# Patient Record
Sex: Female | Born: 1950 | Race: White | Hispanic: No | Marital: Married | Smoking: Never smoker
Health system: Southern US, Community
[De-identification: ages and names within clinical notes are randomized; demographics above are authoritative.]

## PROBLEM LIST (undated history)

## (undated) DIAGNOSIS — R011 Cardiac murmur, unspecified: Secondary | ICD-10-CM

## (undated) DIAGNOSIS — F319 Bipolar disorder, unspecified: Secondary | ICD-10-CM

## (undated) DIAGNOSIS — F29 Unspecified psychosis not due to a substance or known physiological condition: Secondary | ICD-10-CM

## (undated) DIAGNOSIS — N200 Calculus of kidney: Secondary | ICD-10-CM

## (undated) DIAGNOSIS — F32A Depression, unspecified: Secondary | ICD-10-CM

## (undated) DIAGNOSIS — Z973 Presence of spectacles and contact lenses: Secondary | ICD-10-CM

## (undated) DIAGNOSIS — I499 Cardiac arrhythmia, unspecified: Secondary | ICD-10-CM

## (undated) DIAGNOSIS — I10 Essential (primary) hypertension: Secondary | ICD-10-CM

## (undated) DIAGNOSIS — F329 Major depressive disorder, single episode, unspecified: Secondary | ICD-10-CM

## (undated) HISTORY — DX: Cardiac murmur, unspecified: R01.1

## (undated) HISTORY — DX: Major depressive disorder, single episode, unspecified: F32.9

## (undated) HISTORY — DX: Calculus of kidney: N20.0

## (undated) HISTORY — DX: Depression, unspecified: F32.A

## (undated) HISTORY — DX: Essential (primary) hypertension: I10

## (undated) HISTORY — DX: Bipolar disorder, unspecified: F31.9

## (undated) HISTORY — DX: Unspecified psychosis not due to a substance or known physiological condition: F29

---

## 1998-11-29 DIAGNOSIS — N200 Calculus of kidney: Secondary | ICD-10-CM

## 1998-11-29 HISTORY — PX: LITHOTRIPSY: SUR834

## 1998-11-29 HISTORY — DX: Calculus of kidney: N20.0

## 2013-01-02 ENCOUNTER — Ambulatory Visit: Payer: Self-pay | Admitting: Adult Health

## 2013-01-30 ENCOUNTER — Ambulatory Visit: Payer: Self-pay | Admitting: Adult Health

## 2013-02-02 ENCOUNTER — Emergency Department: Payer: Self-pay | Admitting: Emergency Medicine

## 2013-02-02 LAB — COMPREHENSIVE METABOLIC PANEL
Anion Gap: 9 (ref 7–16)
BUN: 12 mg/dL (ref 7–18)
Chloride: 104 mmol/L (ref 98–107)
Co2: 25 mmol/L (ref 21–32)
EGFR (African American): >60
EGFR (Non-African Amer.): >60
Glucose: 92 mg/dL (ref 65–99)
Osmolality: 275 (ref 275–301)
SGOT(AST): 23 U/L (ref 15–37)
Sodium: 138 mmol/L (ref 136–145)

## 2013-02-02 LAB — CBC
HCT: 44.2 % (ref 35.0–47.0)
Platelet: 242 10*3/uL (ref 150–440)
WBC: 8.9 10*3/uL (ref 3.6–11.0)

## 2013-02-02 LAB — DRUG SCREEN, URINE
Amphetamines, Ur Screen: NEGATIVE (ref ?–1000)
Cannabinoid 50 Ng, Ur ~~LOC~~: NEGATIVE (ref ?–50)
Cocaine Metabolite,Ur ~~LOC~~: NEGATIVE (ref ?–300)
MDMA (Ecstasy)Ur Screen: NEGATIVE (ref ?–500)
Opiate, Ur Screen: NEGATIVE (ref ?–300)
Phencyclidine (PCP) Ur S: NEGATIVE (ref ?–25)

## 2013-02-02 LAB — URINALYSIS, COMPLETE
Bilirubin,UR: NEGATIVE
Ketone: NEGATIVE
Nitrite: NEGATIVE
Ph: 7 (ref 4.5–8.0)
Protein: NEGATIVE
RBC,UR: 1 /HPF (ref 0–5)
WBC UR: 3 /HPF (ref 0–5)

## 2013-02-02 LAB — ACETAMINOPHEN LEVEL: Acetaminophen: <2 ug/mL

## 2013-02-02 LAB — SALICYLATE LEVEL: Salicylates, Serum: <1.7 mg/dL

## 2013-02-02 LAB — ETHANOL
Ethanol %: <0.003 % (ref 0.000–0.080)
Ethanol: <3 mg/dL

## 2013-03-05 ENCOUNTER — Ambulatory Visit: Payer: Self-pay | Admitting: Adult Health

## 2013-04-04 ENCOUNTER — Ambulatory Visit (INDEPENDENT_AMBULATORY_CARE_PROVIDER_SITE_OTHER): Payer: No Typology Code available for payment source | Admitting: Adult Health

## 2013-04-04 ENCOUNTER — Encounter: Payer: Self-pay | Admitting: Adult Health

## 2013-04-04 VITALS — BP 160/92 | HR 83 | Temp 97.9°F | Resp 12 | Ht 60.5 in | Wt 179.0 lb

## 2013-04-04 DIAGNOSIS — F3289 Other specified depressive episodes: Secondary | ICD-10-CM

## 2013-04-04 DIAGNOSIS — R3989 Other symptoms and signs involving the genitourinary system: Secondary | ICD-10-CM

## 2013-04-04 DIAGNOSIS — F32A Depression, unspecified: Secondary | ICD-10-CM

## 2013-04-04 DIAGNOSIS — R399 Unspecified symptoms and signs involving the genitourinary system: Secondary | ICD-10-CM

## 2013-04-04 DIAGNOSIS — F329 Major depressive disorder, single episode, unspecified: Secondary | ICD-10-CM

## 2013-04-04 DIAGNOSIS — I10 Essential (primary) hypertension: Secondary | ICD-10-CM | POA: Insufficient documentation

## 2013-04-04 DIAGNOSIS — F309 Manic episode, unspecified: Secondary | ICD-10-CM | POA: Insufficient documentation

## 2013-04-04 LAB — POCT URINALYSIS DIPSTICK
Glucose, UA: NEGATIVE
Nitrite, UA: NEGATIVE
Urobilinogen, UA: 0.2

## 2013-04-04 MED ORDER — CIPROFLOXACIN HCL 250 MG PO TABS: 250.0000 mg | ORAL_TABLET | Freq: Two times a day (BID) | ORAL | Status: DC

## 2013-04-04 MED ORDER — METOPROLOL SUCCINATE ER 25 MG PO TB24: 25.0000 mg | ORAL_TABLET | Freq: Every day | ORAL | Status: DC

## 2013-04-04 NOTE — Assessment & Plan Note (Addendum)
History of recent suicide attempts. She is being followed by Dr. Ladona Ridgel. Patient reports no current suicidal ideations. Will request medical records from Dr. Ladona Ridgel.

## 2013-04-04 NOTE — Assessment & Plan Note (Signed)
Patient with recent episode of psychotic break with ideations of grandeur. Currently on medication and being followed by Dr. Ladona Ridgel in [place]. Will request medical records.

## 2013-04-04 NOTE — Progress Notes (Signed)
Subjective:    Patient ID: Julia Cooke, female    DOB: Aug 01, 1951, 62 y.o.   MRN: [id number]  HPI  Patient is a 62 y/o female who presents to clinic to establish care. Patient was previously followed by PCP in [place]. She is also followed by Dr. Ladona Ridgel for mania and recent hx of suicide x 3 s/p hospitalization.   Past Medical History  Diagnosis Date  . Depression   . Psychotic disorder     mania  . Hypertension   . Heart murmur   . Nephrolithiasis 2000    s/p lithotripsy    Past Surgical History  Procedure Laterality Date  . Lithotripsy  2000    History   Social History  . Marital Status: Married    Spouse Name: N/A    Number of Children: N/A  . Years of Education: N/A   Occupational History  . Not on file.   Social History Main Topics  . Smoking status: Never Smoker   . Smokeless tobacco: Never Used  . Alcohol Use: 1.2 oz/week    2 Glasses of wine per week     Comment: 2-3 times weekly  . Drug Use: No  . Sexually Active: Yes    Birth Control/ Protection: Post-menopausal   Other Topics Concern  . Not on file   Social History Narrative   Patient lives at home with her husband of 41 years. They have 3 adult children (2 boys, 1 girl). They recently moved back to [place] in January. 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 Patient has also worked as a Occupational hygienist.     Review of Systems  Constitutional: Positive for fatigue. Negative for fever and chills.  HENT: Positive for congestion.   Eyes:       Tearing  Respiratory: Positive for shortness of breath. Negative for cough and wheezing.   Cardiovascular: Negative for chest pain and leg swelling.  Gastrointestinal: Negative.   Genitourinary: Positive for frequency and difficulty urinating. Negative for dysuria, hematuria and vaginal discharge.  Musculoskeletal: Positive for arthralgias.       Knee pain  Skin: Negative.   Allergic/Immunologic: Positive for  environmental allergies.  Neurological: Negative for tremors, seizures, syncope, speech difficulty, numbness and headaches.  Hematological: Negative.   Psychiatric/Behavioral: Negative for suicidal ideas, behavioral problems, sleep disturbance and agitation. The patient is nervous/anxious.        Hx of suicide x 3. Most recent episode was 1 month ago. Pt was hospitalized. Followed by Dr. Carolanne Grumbling at [facility]. First episode was 01/29/13 where patient tried to drown herself. She was delusional and she thought she was god and her son was [employer]. Second episode occurred at [facility] where she again tried to drown herself in the toilet. Pt was admitted to [facility] 02/02/13. Third attempt occurred while at [facility] with trying to drown herself in toilet.   BP 160/92  Pulse 83  Temp(Src) 97.9 F (36.6 C) (Oral)  Resp 12  Ht 5' 0.5" (1.537 m)  Wt 179 lb (81.194 kg)  BMI 34.37 kg/m2  SpO2 98%     Objective:   Physical Exam  Constitutional: She is oriented to person, place, and time.  Overweight, pleasant, female in no apparent distress  HENT:  Head: Normocephalic and atraumatic.  Right Ear: External ear normal.  Left Ear: External ear normal.  Eyes: Conjunctivae and EOM are normal. Pupils are equal, round, and reactive to light.  Neck: Normal  range of motion. Neck supple.  Cardiovascular: Normal rate, regular rhythm, normal heart sounds and intact distal pulses.  Exam reveals no gallop.   No murmur heard. Pulmonary/Chest: Effort normal and breath sounds normal. No respiratory distress. She has no wheezes. She has no rales.  Abdominal: Soft. Bowel sounds are normal. She exhibits no distension and no mass. There is no tenderness. There is no rebound and no guarding.  Musculoskeletal: Normal range of motion. She exhibits no edema and no tenderness.  Lymphadenopathy:    She has no cervical adenopathy.  Neurological: She is alert and oriented to person, place, and time. No cranial nerve deficit.  Coordination normal.  Skin: Skin is warm and dry.  Psychiatric: She has a normal mood and affect. Her behavior is normal. Judgment and thought content normal.       Assessment & Plan:

## 2013-04-04 NOTE — Assessment & Plan Note (Signed)
Restart Toprol 25 mg daily. Diet low in sodium. Exercise at least 3 times a week. RTC in 4-5 weeks for followup.

## 2013-04-04 NOTE — Progress Notes (Signed)
Left message for pt, advising of urinalysis results. Advised Cipro 250 mg 1 tab BID X 3 days escripted to [facility] per Orville Govern, NP.

## 2013-04-04 NOTE — Patient Instructions (Addendum)
  Thank you for choosing [facility] for your health care needs.  Start the metoprolol 25 mg daily.  Return for follow up appointment in 4 weeks.  I am requesting medical records from your previous primary care provider as well as Dr. Ladona Ridgel with [facility].

## 2013-04-05 ENCOUNTER — Other Ambulatory Visit: Payer: Self-pay | Admitting: *Deleted

## 2013-04-05 DIAGNOSIS — Z1211 Encounter for screening for malignant neoplasm of colon: Secondary | ICD-10-CM

## 2013-04-05 DIAGNOSIS — Z0000 Encounter for general adult medical examination without abnormal findings: Secondary | ICD-10-CM

## 2013-04-05 DIAGNOSIS — Z1239 Encounter for other screening for malignant neoplasm of breast: Secondary | ICD-10-CM

## 2013-04-05 LAB — URINE CULTURE: Colony Count: 70000

## 2013-04-20 ENCOUNTER — Other Ambulatory Visit (INDEPENDENT_AMBULATORY_CARE_PROVIDER_SITE_OTHER): Payer: No Typology Code available for payment source

## 2013-04-20 DIAGNOSIS — Z0000 Encounter for general adult medical examination without abnormal findings: Secondary | ICD-10-CM

## 2013-04-20 LAB — CBC WITH DIFFERENTIAL/PLATELET
Basophils Absolute: 0.1 K/uL (ref 0.0–0.1)
Basophils Relative: 0.9 % (ref 0.0–3.0)
Eosinophils Absolute: 0.1 K/uL (ref 0.0–0.7)
Eosinophils Relative: 2.3 % (ref 0.0–5.0)
HCT: 41.8 % (ref 36.0–46.0)
Hemoglobin: 14.4 g/dL (ref 12.0–15.0)
Lymphocytes Relative: 29.7 % (ref 12.0–46.0)
Lymphs Abs: 1.9 K/uL (ref 0.7–4.0)
MCHC: 34.6 g/dL (ref 30.0–36.0)
MCV: 90.2 fl (ref 78.0–100.0)
Monocytes Absolute: 0.6 K/uL (ref 0.1–1.0)
Monocytes Relative: 9.2 % (ref 3.0–12.0)
Neutro Abs: 3.6 K/uL (ref 1.4–7.7)
Neutrophils Relative %: 57.9 % (ref 43.0–77.0)
Platelets: 177 K/uL (ref 150.0–400.0)
RBC: 4.64 Mil/uL (ref 3.87–5.11)
RDW: 12.4 % (ref 11.5–14.6)
WBC: 6.3 K/uL (ref 4.5–10.5)

## 2013-04-20 LAB — COMPREHENSIVE METABOLIC PANEL
ALT: 17 U/L (ref 0–35)
Albumin: 3.9 g/dL (ref 3.5–5.2)
CO2: 26 mEq/L (ref 19–32)
Calcium: 8.9 mg/dL (ref 8.4–10.5)
Chloride: 105 mEq/L (ref 96–112)
GFR: 83.4 mL/min (ref >60.00)
Glucose, Bld: 91 mg/dL (ref 70–99)
Potassium: 4.6 mEq/L (ref 3.5–5.1)
Sodium: 138 mEq/L (ref 135–145)
Total Bilirubin: 0.5 mg/dL (ref 0.3–1.2)
Total Protein: 6.8 g/dL (ref 6.0–8.3)

## 2013-04-20 LAB — TSH: TSH: 0.45 u[IU]/mL (ref 0.35–5.50)

## 2013-04-20 LAB — LIPID PANEL: HDL: 47.7 mg/dL (ref >39.00)

## 2013-04-20 LAB — LDL CHOLESTEROL, DIRECT: Direct LDL: 136.4 mg/dL

## 2013-05-02 ENCOUNTER — Encounter: Payer: Self-pay | Admitting: Adult Health

## 2013-05-02 ENCOUNTER — Encounter: Payer: Self-pay | Admitting: Emergency Medicine

## 2013-05-02 ENCOUNTER — Ambulatory Visit (INDEPENDENT_AMBULATORY_CARE_PROVIDER_SITE_OTHER): Payer: No Typology Code available for payment source | Admitting: Adult Health

## 2013-05-02 VITALS — BP 120/60 | HR 82 | Resp 12 | Wt 179.5 lb

## 2013-05-02 DIAGNOSIS — M25569 Pain in unspecified knee: Secondary | ICD-10-CM

## 2013-05-02 DIAGNOSIS — M25561 Pain in right knee: Secondary | ICD-10-CM

## 2013-05-02 DIAGNOSIS — J329 Chronic sinusitis, unspecified: Secondary | ICD-10-CM

## 2013-05-02 DIAGNOSIS — I10 Essential (primary) hypertension: Secondary | ICD-10-CM

## 2013-05-02 MED ORDER — AZITHROMYCIN 250 MG PO TABS: ORAL_TABLET | ORAL | Status: DC

## 2013-05-02 MED ORDER — FLUTICASONE PROPIONATE 50 MCG/ACT NA SUSP: 2.0000 | Freq: Every day | NASAL | Status: DC

## 2013-05-02 NOTE — Assessment & Plan Note (Signed)
Patient is status post fall in December of 2012. Chronic problems with right knee without evaluation. She has swelling and pain with ambulation. Refer to orthopedic.

## 2013-05-02 NOTE — Patient Instructions (Addendum)
  Start Azithromycin today. Take 2 tablets today then 1 tablet daily for the next 4 days.  Flonase nasal spray 2 sprays in each nostril at bedtime.  Sinus rinse - you can use a netti pot or simply saline spray.  Your lab work was all normal.  Try to implement an exercise program which will contribute to your overall health and well-being.  I am referring you to orthopedic for your chronic right knee pain and swelling.

## 2013-05-02 NOTE — Assessment & Plan Note (Addendum)
Green drainage. Start Zpak. Flonase nasal spray. May use simple saline to help irrigate sinus.

## 2013-05-02 NOTE — Assessment & Plan Note (Addendum)
Started on metoprolol approximately 4 weeks ago. Blood pressure has been well-controlled. Encouraged exercising for overall health and weight loss. Discussed lab work with patient. All were normal. Patient will return for complete physical in October 2014. Note, greater than 25 minutes were spent in face-to-face communication with the assessment, plan and implementation of  problems listed.

## 2013-05-02 NOTE — Progress Notes (Signed)
  Subjective:    Patient ID: Julia Cooke, female    DOB: [birth year], 62 y.o.   MRN: [id number]  HPI  Patient is here for f/u of hypertension. She was started on metoprolol approximately 4 weeks ago. She is taking her medication daily. She is feeling well. Still not exercising. Denies headache, shortness of breath, chest pain or edema.  Patient also reports greater than one week symptoms of sinus pressure, green drainage, mild cough, congestion. Her husband has been sick for greater than 2 weeks and she believes she has contracted what he has.  Patient also reports right knee pain. She reports she fell December 2012 and her knee has not been the same since. She has not had any followup or evaluation for this. She has some swelling of her right knee. Pain is mainly with ambulation. She has a steady gait, good balance.   Past Medical History  Diagnosis Date  . Depression   . Psychotic disorder     mania  . Hypertension   . Heart murmur   . Nephrolithiasis 2000    s/p lithotripsy     Review of Systems  Constitutional: Negative for fever and chills.  HENT: Positive for congestion, sore throat and sinus pressure.   Eyes: Negative for photophobia and visual disturbance.       Runny eyes. Patient believes this is from seasonal allergies.  Respiratory: Positive for cough. Negative for wheezing.   Cardiovascular: Negative.   Gastrointestinal: Negative.   Endocrine: Negative.   Genitourinary: Negative.   Musculoskeletal: Positive for joint swelling.       Right knee pain  Skin: Negative.   Allergic/Immunologic: Positive for environmental allergies.  Neurological: Negative for dizziness, syncope, light-headedness and headaches.  Hematological: Negative.   Psychiatric/Behavioral: Negative for behavioral problems, confusion and agitation. The patient is not nervous/anxious.     BP 120/60  Pulse 82  Resp 12  Wt 179 lb 8 oz (81.421 kg)  BMI 34.47 kg/m2  SpO2 96%    Objective:   Physical Exam  Constitutional: She is oriented to person, place, and time.  Overweight, pleasant female in NAD  Cardiovascular: Normal rate, regular rhythm and normal heart sounds.  Exam reveals no gallop.   No murmur heard. Pulmonary/Chest: Effort normal and breath sounds normal. No respiratory distress. She has no wheezes. She has no rales.  Abdominal: Soft. Bowel sounds are normal.  Neurological: She is alert and oriented to person, place, and time.  Skin: Skin is warm and dry.  Psychiatric: She has a normal mood and affect. Judgment and thought content normal.       Assessment & Plan:

## 2013-05-30 ENCOUNTER — Other Ambulatory Visit: Payer: Self-pay | Admitting: *Deleted

## 2013-05-30 MED ORDER — FLUTICASONE PROPIONATE 50 MCG/ACT NA SUSP: 2.0000 | Freq: Every day | NASAL | Status: DC

## 2013-05-30 NOTE — Telephone Encounter (Signed)
Rx sent to pharmacy by escript. Prior refill in error, it was denied, but meant to ok since it was requested by mail order.

## 2013-05-30 NOTE — Telephone Encounter (Signed)
Done 05/02/13 with refills, receipt confirmed by pharmacy

## 2013-06-04 ENCOUNTER — Other Ambulatory Visit: Payer: Self-pay | Admitting: *Deleted

## 2013-06-04 MED ORDER — METOPROLOL SUCCINATE ER 25 MG PO TB24: 25.0000 mg | ORAL_TABLET | Freq: Every day | ORAL | Status: DC

## 2013-07-13 ENCOUNTER — Encounter: Payer: Self-pay | Admitting: Adult Health

## 2013-07-13 ENCOUNTER — Ambulatory Visit (INDEPENDENT_AMBULATORY_CARE_PROVIDER_SITE_OTHER): Payer: No Typology Code available for payment source | Admitting: Adult Health

## 2013-07-13 VITALS — HR 93 | Temp 98.3°F | Resp 14 | Wt 175.5 lb

## 2013-07-13 DIAGNOSIS — W57XXXA Bitten or stung by nonvenomous insect and other nonvenomous arthropods, initial encounter: Secondary | ICD-10-CM

## 2013-07-13 DIAGNOSIS — T148 Other injury of unspecified body region: Secondary | ICD-10-CM

## 2013-07-13 MED ORDER — AZITHROMYCIN 250 MG PO TABS: ORAL_TABLET | ORAL | Status: DC

## 2013-07-13 NOTE — Progress Notes (Signed)
  Subjective:    Patient ID: Julia Cooke, female    DOB: Sep 03, 1951, 62 y.o.   MRN: [id number]  HPI  Julia Cooke presents s/p tick bite with surrounding irritation and iching. She noticed the tick 3 days ago and removed it completely. She does not know how long it had been attached. No other symptoms.   Current Outpatient Prescriptions on File Prior to Visit  Medication Sig Dispense Refill  . carbamazepine (TEGRETOL) 200 MG tablet Take 200 mg by mouth at bedtime.      . metoprolol succinate (TOPROL-XL) 25 MG 24 hr tablet Take 1 tablet (25 mg total) by mouth daily.  90 tablet  1  . Multiple Vitamin (MULTIVITAMIN) tablet Take 1 tablet by mouth daily.      . risperiDONE (RISPERDAL) 3 MG tablet Take 3 mg by mouth at bedtime.      . traZODone (DESYREL) 100 MG tablet Take 50 mg by mouth at bedtime.       . fluticasone (FLONASE) 50 MCG/ACT nasal spray Place 2 sprays into the nose daily.  16 g  5   No current facility-administered medications on file prior to visit.      Review of Systems  Skin: left upper arm tick bite with irritation and redness.  Pulse 93  Temp(Src) 98.3 F (36.8 C) (Oral)  Resp 14  Wt 175 lb 8 oz (79.606 kg)  BMI 33.7 kg/m2  SpO2 98%     Objective:   Physical Exam  Skin: left upper arm with erythema surrounding area of tick bite. Erythema is uniform. No fever or s/s infection.       Assessment & Plan:

## 2013-07-13 NOTE — Patient Instructions (Addendum)
  Start Azithromycin - take 2 tablets today then take 1 table daily for the next 4 days.  Apply cortisone cream to the area for itching.

## 2013-07-13 NOTE — Assessment & Plan Note (Signed)
Tick bite and tick removed 3 days ago. No s/s infection. She cannot tolerate doxycycline secondary to diarrhea. Start azithromycin. RTC if fever, rash or flu like symptoms.

## 2013-11-02 ENCOUNTER — Telehealth: Payer: Self-pay | Admitting: Adult Health

## 2013-11-02 ENCOUNTER — Other Ambulatory Visit: Payer: Self-pay

## 2013-11-02 MED ORDER — METOPROLOL SUCCINATE ER 25 MG PO TB24: 25.0000 mg | ORAL_TABLET | Freq: Every day | ORAL | Status: DC

## 2013-11-02 NOTE — Telephone Encounter (Signed)
rx sent to [facility] [address]

## 2013-11-02 NOTE — Telephone Encounter (Signed)
Metoprolol needed.  States she is completely out.  States she needs it from [employer], 30 day supply.  States usually gets mail order so local pharmacy could not do refill request.

## 2013-11-20 ENCOUNTER — Emergency Department: Payer: Self-pay | Admitting: Emergency Medicine

## 2014-01-07 ENCOUNTER — Encounter (INDEPENDENT_AMBULATORY_CARE_PROVIDER_SITE_OTHER): Payer: Self-pay

## 2014-01-07 ENCOUNTER — Encounter: Payer: Self-pay | Admitting: Adult Health

## 2014-01-07 ENCOUNTER — Ambulatory Visit (INDEPENDENT_AMBULATORY_CARE_PROVIDER_SITE_OTHER): Payer: BC Managed Care – PPO | Admitting: Adult Health

## 2014-01-07 VITALS — BP 130/88 | HR 94 | Resp 12 | Wt 176.0 lb

## 2014-01-07 DIAGNOSIS — L293 Anogenital pruritus, unspecified: Secondary | ICD-10-CM

## 2014-01-07 DIAGNOSIS — R5381 Other malaise: Secondary | ICD-10-CM

## 2014-01-07 DIAGNOSIS — R300 Dysuria: Secondary | ICD-10-CM

## 2014-01-07 DIAGNOSIS — R5383 Other fatigue: Secondary | ICD-10-CM

## 2014-01-07 DIAGNOSIS — Z79899 Other long term (current) drug therapy: Secondary | ICD-10-CM

## 2014-01-07 DIAGNOSIS — N898 Other specified noninflammatory disorders of vagina: Secondary | ICD-10-CM

## 2014-01-07 DIAGNOSIS — R6882 Decreased libido: Secondary | ICD-10-CM

## 2014-01-07 LAB — POCT URINALYSIS DIPSTICK
Bilirubin, UA: NEGATIVE
Blood, UA: NEGATIVE
Glucose, UA: NEGATIVE
Ketones, UA: NEGATIVE
NITRITE UA: NEGATIVE
PROTEIN UA: NEGATIVE
SPEC GRAV UA: 1.015
UROBILINOGEN UA: 0.2
pH, UA: 6.5

## 2014-01-07 LAB — CBC WITH DIFFERENTIAL/PLATELET
BASOS ABS: 0 10*3/uL (ref 0.0–0.1)
Basophils Relative: 0.6 % (ref 0.0–3.0)
EOS PCT: 1.1 % (ref 0.0–5.0)
Eosinophils Absolute: 0.1 10*3/uL (ref 0.0–0.7)
HCT: 41.8 % (ref 36.0–46.0)
HEMOGLOBIN: 14.2 g/dL (ref 12.0–15.0)
LYMPHS PCT: 18.3 % (ref 12.0–46.0)
Lymphs Abs: 1.2 10*3/uL (ref 0.7–4.0)
MCHC: 33.9 g/dL (ref 30.0–36.0)
MCV: 93.1 fl (ref 78.0–100.0)
MONOS PCT: 7.2 % (ref 3.0–12.0)
Monocytes Absolute: 0.5 10*3/uL (ref 0.1–1.0)
NEUTROS ABS: 4.9 10*3/uL (ref 1.4–7.7)
Neutrophils Relative %: 72.8 % (ref 43.0–77.0)
Platelets: 234 10*3/uL (ref 150.0–400.0)
RBC: 4.49 Mil/uL (ref 3.87–5.11)
RDW: 12.7 % (ref 11.5–14.6)
WBC: 6.7 10*3/uL (ref 4.5–10.5)

## 2014-01-07 LAB — LIPID PANEL
CHOLESTEROL: 242 mg/dL — AB (ref 0–200)
HDL: 50.2 mg/dL (ref >39.00)
Total CHOL/HDL Ratio: 5
Triglycerides: 77 mg/dL (ref 0.0–149.0)
VLDL: 15.4 mg/dL (ref 0.0–40.0)

## 2014-01-07 LAB — COMPREHENSIVE METABOLIC PANEL
ALT: 18 U/L (ref 0–35)
AST: 20 U/L (ref 0–37)
Albumin: 4.3 g/dL (ref 3.5–5.2)
Alkaline Phosphatase: 63 U/L (ref 39–117)
BILIRUBIN TOTAL: 0.7 mg/dL (ref 0.3–1.2)
BUN: 21 mg/dL (ref 6–23)
CO2: 25 meq/L (ref 19–32)
CREATININE: 0.7 mg/dL (ref 0.4–1.2)
Calcium: 9.2 mg/dL (ref 8.4–10.5)
Chloride: 106 mEq/L (ref 96–112)
GFR: 84.5 mL/min (ref >60.00)
GLUCOSE: 104 mg/dL — AB (ref 70–99)
Potassium: 4.2 mEq/L (ref 3.5–5.1)
Sodium: 139 mEq/L (ref 135–145)
TOTAL PROTEIN: 7.4 g/dL (ref 6.0–8.3)

## 2014-01-07 LAB — LDL CHOLESTEROL, DIRECT: LDL DIRECT: 181.7 mg/dL

## 2014-01-07 LAB — TSH: TSH: 0.93 u[IU]/mL (ref 0.35–5.50)

## 2014-01-07 MED ORDER — CIPROFLOXACIN HCL 250 MG PO TABS: 250.0000 mg | ORAL_TABLET | Freq: Two times a day (BID) | ORAL | Status: DC

## 2014-01-07 MED ORDER — FLUCONAZOLE 150 MG PO TABS: 150.0000 mg | ORAL_TABLET | Freq: Once | ORAL | Status: DC

## 2014-01-07 NOTE — Progress Notes (Signed)
Patient ID: Julia Cooke, female   DOB: 1951/03/29, 63 y.o.   MRN: [id number]    Subjective:    Patient ID: Julia Cooke, female    DOB: Jan 19, 1951, 63 y.o.   MRN: [id number]  HPI  Pt presents to clinic with the following concerns:  1) She recently saw her psychiatrist 01/02/14. She reports seeing a new MD because she did not feel she was connecting well with the female psychiatrist. Now has a female doctor. Medications reviewed and Dr. Jamse Arn would like labs for medication management. Pt reports compliance with all her medications.  2) Vaginal itching with discharge. Also feels pain during intercourse. She reports being very dry.  3) Fatigue. Some of her medications have this as a side effect.  4) Decreased libido. Reports not engaging in sex for months. She also reports that she feels claustrophobic or smothered when her husband approaches her. In addition, her husband has problems with ED. She is appears very distressed over her lack of intimacy with her husband.  5) Dysuria. Noticed some burning with urination and has been ongoing for several days. Has not taken any over-the-counter products  Past Medical History  Diagnosis Date  . Depression   . Psychotic disorder     mania  . Hypertension   . Heart murmur   . Nephrolithiasis 2000    s/p lithotripsy   Past Surgical History  Procedure Laterality Date  . Lithotripsy  2000    Family History  Problem Relation Age of Onset  . Hyperlipidemia Mother   . Stroke Mother   . Hypertension Mother   . Hyperlipidemia Father   . Hypertension Father   . Cancer Sister     breast  . Heart disease Brother     myocardial infarction  . Hypertension Brother   . Hypertension Sister   . Hypertension Sister     History   Social History  . Marital Status: Married    Spouse Name: N/A    Number of Children: N/A  . Years of Education: N/A   Occupational History  . Not on file.   Social History Main Topics  . Smoking status: Never  Smoker   . Smokeless tobacco: Never Used  . Alcohol Use: 1.2 oz/week    2 Glasses of wine per week     Comment: 2-3 times weekly  . Drug Use: No  . Sexual Activity: Yes    Birth Control/ Protection: Post-menopausal   Other Topics Concern  . Not on file   Social History Narrative   Patient lives at home with her husband of 41 years. They have 3 adult children (2 boys, 1 girl). They recently moved back to [place] in January. 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     Current Outpatient Prescriptions on File Prior to Visit  Medication Sig Dispense Refill  . fluticasone (FLONASE) 50 MCG/ACT nasal spray Place 2 sprays into the nose daily.  16 g  5  . metoprolol succinate (TOPROL-XL) 25 MG 24 hr tablet Take 1 tablet (25 mg total) by mouth daily.  90 tablet  1  . Multiple Vitamin (MULTIVITAMIN) tablet Take 1 tablet by mouth daily.      . risperiDONE (RISPERDAL) 3 MG tablet Take 3 mg by mouth at bedtime.       No current facility-administered medications on file prior to visit.     Review of Systems  Constitutional:  Positive for fatigue.  HENT: Negative.   Eyes: Negative.   Respiratory: Negative.   Cardiovascular: Negative.   Gastrointestinal: Negative.   Endocrine: Negative.   Genitourinary: Positive for dysuria, vaginal discharge and dyspareunia. Negative for hematuria, flank pain and pelvic pain.       Vaginal itching  Musculoskeletal: Negative.   Skin: Negative.   Allergic/Immunologic: Negative.   Neurological: Negative.   Hematological: Negative.   Psychiatric/Behavioral:       Distress over decreased libido       Objective:  BP 130/88  Pulse 94  Resp 12  Wt 176 lb (79.833 kg)  SpO2 97%   Physical Exam  Constitutional: She is oriented to person, place, and time. She appears well-developed and well-nourished. No distress.  HENT:  Head: Normocephalic and atraumatic.  Neck: Normal range of  motion. Neck supple. No tracheal deviation present.  Cardiovascular: Normal rate and regular rhythm.  Exam reveals friction rub.   Pulmonary/Chest: Effort normal. No respiratory distress.  Abdominal: Soft. Bowel sounds are normal. She exhibits no distension and no mass. There is no tenderness. There is no rebound and no guarding.  Genitourinary: Rectum normal and uterus normal.    Cervix exhibits discharge. Cervix exhibits no motion tenderness and no friability. Right adnexum displays no mass, no tenderness and no fullness. Left adnexum displays no mass, no tenderness and no fullness. Vaginal discharge found.    Musculoskeletal: Normal range of motion. She exhibits no edema.  Neurological: She is alert and oriented to person, place, and time. She has normal reflexes. Coordination normal.  Skin: Skin is warm and dry.  Psychiatric: She has a normal mood and affect. Her behavior is normal. Judgment and thought content normal.       Assessment & Plan:   1. Fatigue  check labs  - TSH - CBC with Differential  2. Decreased libido Reviewed medications. None really list side effect of decreased libido. Discussed different approaches she can take to promote intimacy with her husband. Discussed using OTC water based lubricants. F/U 2 weeks   3. Vaginal itching GYN exam showed some atrophy. There was some discharge. Wet Prep sent.  - fluconazole (DIFLUCAN) 150 MG tablet; Take 1 tablet (150 mg total) by mouth once.  Dispense: 3 tablet; Refill: 0  4. Medication management Recently saw her psychiatrist. Pt is on medications that need monitoring. Will check labs and send results to Psych. Pt wanted to have all labs drawn here.  - Comprehensive metabolic panel - CBC with Differential - Lipid panel - Carbamazepine Level (Tegretol), total  5. Dysuria UA shows possible UTI. Send for culture. Start Cipro  - ciprofloxacin (CIPRO) 250 MG tablet; Take 1 tablet (250 mg total) by mouth 2 (two)  times daily.  Dispense: 6 tablet; Refill: 0 - Urine Culture - POCT Urinalysis Dipstick

## 2014-01-07 NOTE — Addendum Note (Signed)
Addended by: Karlene Einstein D on: 01/07/2014 03:06 PM   Modules accepted: Orders

## 2014-01-07 NOTE — Addendum Note (Signed)
Addended by: Karlene Einstein D on: 01/07/2014 03:05 PM   Modules accepted: Orders

## 2014-01-07 NOTE — Patient Instructions (Signed)
  Start Cipro 250 mg twice a day for 3 days.  Also take Diflucan 150 mg daily for 3 days.  Please have labs drawn prior to leaving the office.  I will call you with results once they're available.  I will also send the results requested by adult services to them for review.  Use a water based lubricant without any perfumes such as [place] Jelly to see if this improves your symptoms.  Return for follow up in 2 weeks.

## 2014-01-07 NOTE — Progress Notes (Signed)
Pre visit review using our clinic review tool, if applicable. No additional management support is needed unless otherwise documented below in the visit note. 

## 2014-01-08 LAB — WET PREP BY MOLECULAR PROBE
CANDIDA SPECIES: NEGATIVE
Gardnerella vaginalis: NEGATIVE
TRICHOMONAS VAG: NEGATIVE

## 2014-01-08 LAB — URINE CULTURE: Colony Count: 30000

## 2014-01-08 LAB — CARBAMAZEPINE LEVEL, TOTAL: Carbamazepine Lvl: 2.9 ug/mL — ABNORMAL LOW (ref 4.0–12.0)

## 2014-01-10 LAB — CULTURE, ROUTINE-GENITAL: Organism ID, Bacteria: NORMAL

## 2014-01-28 ENCOUNTER — Ambulatory Visit (INDEPENDENT_AMBULATORY_CARE_PROVIDER_SITE_OTHER): Payer: BC Managed Care – PPO | Admitting: Adult Health

## 2014-01-28 ENCOUNTER — Encounter: Payer: Self-pay | Admitting: Adult Health

## 2014-01-28 VITALS — BP 128/68 | HR 92 | Temp 98.4°F | Resp 14 | Wt 177.0 lb

## 2014-01-28 DIAGNOSIS — IMO0002 Reserved for concepts with insufficient information to code with codable children: Secondary | ICD-10-CM | POA: Insufficient documentation

## 2014-01-28 DIAGNOSIS — N951 Menopausal and female climacteric states: Secondary | ICD-10-CM | POA: Insufficient documentation

## 2014-01-28 MED ORDER — ESTROGENS, CONJUGATED 0.625 MG/GM VA CREA: TOPICAL_CREAM | VAGINAL | Status: DC

## 2014-01-28 NOTE — Progress Notes (Signed)
   Subjective:    Patient ID: Julia Cooke, female    DOB: [birth year], 63 y.o.   MRN: [id number]  HPI  Mrs. Carreto is a pleasant 63 y/o female who presents to clinic for f/u of vaginal symptoms. She was seen 2 weeks ago with c/o dyspareunia, dryness and itching. Wet prep was negative for yeast or BV. She did have some atrophy noted on exam. She was also treated for UTI symptoms which these have resolved. Pt reports that she has a burning sensation when urine comes in contact with her skin. Still having the dryness.  Current Outpatient Prescriptions on File Prior to Visit  Medication Sig Dispense Refill  . fluconazole (DIFLUCAN) 150 MG tablet Take 1 tablet (150 mg total) by mouth once.  3 tablet  0  . fluticasone (FLONASE) 50 MCG/ACT nasal spray Place 2 sprays into the nose daily.  16 g  5  . metoprolol succinate (TOPROL-XL) 25 MG 24 hr tablet Take 1 tablet (25 mg total) by mouth daily.  90 tablet  1  . Multiple Vitamin (MULTIVITAMIN) tablet Take 1 tablet by mouth daily.      . risperiDONE (RISPERDAL) 3 MG tablet Take 3 mg by mouth at bedtime.       No current facility-administered medications on file prior to visit.     Review of Systems  Constitutional: Negative.   Respiratory: Negative.   Cardiovascular: Negative.   Genitourinary: Positive for dyspareunia. Negative for frequency, hematuria, vaginal bleeding, vaginal discharge, genital sores and pelvic pain.  Musculoskeletal: Negative.   Neurological: Negative.   Psychiatric/Behavioral: Negative.   All other systems reviewed and are negative.       Objective:   Physical Exam  Constitutional: She is oriented to person, place, and time. She appears well-developed and well-nourished. No distress.  Cardiovascular: Normal rate and regular rhythm.   Pulmonary/Chest: Effort normal. No respiratory distress.  Genitourinary:  Wet Prep negative for yeast or bacteria  Musculoskeletal: Normal range of motion.  Neurological: She is alert  and oriented to person, place, and time.  Skin: Skin is warm and dry.  Psychiatric: She has a normal mood and affect. Her behavior is normal. Thought content normal.       Assessment & Plan:   1. Dyspareunia Discussed trying premarin vaginal cream. Samples provided. Instructed on use.  2. Vaginal dryness, menopausal Premarin cream. If symptoms do not improve will refer to GYN

## 2014-01-28 NOTE — Progress Notes (Signed)
Pre visit review using our clinic review tool, if applicable. No additional management support is needed unless otherwise documented below in the visit note. 

## 2014-01-28 NOTE — Patient Instructions (Signed)
  Apply 0.5 g of premarin into the vagina nightly for 7 days then start every other night.  Please let me know if this is improving your symptoms.

## 2014-04-08 ENCOUNTER — Ambulatory Visit (INDEPENDENT_AMBULATORY_CARE_PROVIDER_SITE_OTHER): Payer: BC Managed Care – PPO | Admitting: Adult Health

## 2014-04-08 ENCOUNTER — Other Ambulatory Visit (HOSPITAL_COMMUNITY)
Admission: RE | Admit: 2014-04-08 | Discharge: 2014-04-08 | Disposition: A | Discharge status: Home / Self Care | Payer: BC Managed Care – PPO | Source: Ambulatory Visit | Attending: Adult Health | Admitting: Adult Health

## 2014-04-08 ENCOUNTER — Encounter: Payer: Self-pay | Admitting: Adult Health

## 2014-04-08 VITALS — BP 120/78 | HR 90 | Temp 98.2°F | Resp 12 | Ht 60.0 in | Wt 174.0 lb

## 2014-04-08 DIAGNOSIS — Z1211 Encounter for screening for malignant neoplasm of colon: Secondary | ICD-10-CM

## 2014-04-08 DIAGNOSIS — Z0000 Encounter for general adult medical examination without abnormal findings: Secondary | ICD-10-CM

## 2014-04-08 DIAGNOSIS — Z23 Encounter for immunization: Secondary | ICD-10-CM

## 2014-04-08 DIAGNOSIS — Z01419 Encounter for gynecological examination (general) (routine) without abnormal findings: Secondary | ICD-10-CM | POA: Insufficient documentation

## 2014-04-08 DIAGNOSIS — R7301 Impaired fasting glucose: Secondary | ICD-10-CM | POA: Insufficient documentation

## 2014-04-08 DIAGNOSIS — R7309 Other abnormal glucose: Secondary | ICD-10-CM

## 2014-04-08 DIAGNOSIS — Z124 Encounter for screening for malignant neoplasm of cervix: Secondary | ICD-10-CM

## 2014-04-08 DIAGNOSIS — Z1239 Encounter for other screening for malignant neoplasm of breast: Secondary | ICD-10-CM

## 2014-04-08 DIAGNOSIS — Z0001 Encounter for general adult medical examination with abnormal findings: Secondary | ICD-10-CM | POA: Insufficient documentation

## 2014-04-08 DIAGNOSIS — R739 Hyperglycemia, unspecified: Secondary | ICD-10-CM

## 2014-04-08 DIAGNOSIS — Z1151 Encounter for screening for human papillomavirus (HPV): Secondary | ICD-10-CM | POA: Insufficient documentation

## 2014-04-08 MED ORDER — ZOSTER VACCINE LIVE 19400 UNT/0.65ML ~~LOC~~ SOLR: 0.6500 mL | Freq: Once | SUBCUTANEOUS | Status: DC

## 2014-04-08 NOTE — Progress Notes (Signed)
Patient ID: Julia Cooke, female   DOB: [birth year], 62 y.o.   MRN: [id number]   Subjective:    Patient ID: Julia Cooke, female    DOB: 20-Jul-1951, 63 y.o.   MRN: [id number]  HPI  Patient is a pleasant 63 year old female who presents to clinic for her annual exam including breast and Pap. She reports recent episode of stomach upset. No vomiting or diarrhea. Reports decreased appetite. She believes she may have eaten something that did not agree with her.   Past Medical History  Diagnosis Date  . Depression   . Psychotic disorder     mania  . Hypertension   . Heart murmur   . Nephrolithiasis 2000    s/p lithotripsy    Past Surgical History  Procedure Laterality Date  . Lithotripsy  2000    Family History  Problem Relation Age of Onset  . Hyperlipidemia Mother   . Stroke Mother   . Hypertension Mother   . Hyperlipidemia Father   . Hypertension Father   . Cancer Sister     breast  . Heart disease Brother     myocardial infarction  . Hypertension Brother   . Hypertension Sister   . Hypertension Sister      History   Social History  . Marital Status: Married    Spouse Name: N/A    Number of Children: N/A  . Years of Education: N/A   Occupational History  . Not on file.   Social History Main Topics  . Smoking status: Never Smoker   . Smokeless tobacco: Never Used  . Alcohol Use: 1.2 oz/week    2 Glasses of wine per week     Comment: 2-3 times weekly  . Drug Use: No  . Sexual Activity: Yes    Birth Control/ Protection: Post-menopausal   Other Topics Concern  . Not on file   Social History Narrative   Patient lives at home with her husband of 30 years. They have 3 adult children (2 boys, 1 girl). They recently moved back to [place] in January. 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    Current Outpatient Prescriptions on File Prior to Visit  Medication Sig Dispense Refill  .  carbamazepine (TEGRETOL) 200 MG tablet Take 200 mg by mouth once. Take 1/2 tablet daily      . metoprolol succinate (TOPROL-XL) 25 MG 24 hr tablet Take 1 tablet (25 mg total) by mouth daily.  90 tablet  1  . Multiple Vitamin (MULTIVITAMIN) tablet Take 1 tablet by mouth daily.      . risperiDONE (RISPERDAL) 3 MG tablet Take 3 mg by mouth at bedtime. Take 1/2 tablet at bedtime       No current facility-administered medications on file prior to visit.    Review of Systems  Constitutional: Positive for appetite change (recent occurrence ).  HENT: Negative.   Eyes: Negative.   Respiratory: Negative.   Cardiovascular: Negative.   Gastrointestinal: Negative.   Endocrine: Negative.   Genitourinary: Negative.  Negative for vaginal bleeding, vaginal discharge, vaginal pain and pelvic pain.       Vaginal itch   Musculoskeletal: Negative.   Skin: Negative.   Allergic/Immunologic: Negative.   Neurological: Negative.   Hematological: Negative.   Psychiatric/Behavioral: Negative.        Objective:  BP 120/78  Pulse 90  Temp(Src) 98.2 F (36.8 C) (Oral)  Resp 12  Ht 5' (1.524 m)  Wt 174 lb (78.926 kg)  BMI 33.98 kg/m2  SpO2 95%   Physical Exam  Constitutional: She is oriented to person, place, and time. She appears well-developed and well-nourished. No distress.  HENT:  Head: Normocephalic and atraumatic.  Right Ear: External ear normal.  Left Ear: External ear normal.  Nose: Nose normal.  Mouth/Throat: Oropharynx is clear and moist.  Eyes: Conjunctivae and EOM are normal. Pupils are equal, round, and reactive to light.  Neck: Normal range of motion. Neck supple. No tracheal deviation present. No thyromegaly present.  Cardiovascular: Normal rate, regular rhythm, normal heart sounds and intact distal pulses.  Exam reveals no gallop and no friction rub.   No murmur heard. Pulmonary/Chest: Effort normal and breath sounds normal. No respiratory distress. She has no wheezes. She has no  rales. Right breast exhibits no inverted nipple, no mass, no nipple discharge, no skin change and no tenderness. Left breast exhibits no inverted nipple, no mass, no nipple discharge, no skin change and no tenderness. Breasts are symmetrical.  Abdominal: Soft. Bowel sounds are normal. She exhibits no distension and no mass. There is no tenderness. There is no rebound and no guarding.  Genitourinary: Vagina normal and uterus normal. Rectal exam shows external hemorrhoid. Rectal exam shows no internal hemorrhoid, no fissure, no mass, no tenderness and anal tone normal. Guaiac negative stool. No breast swelling, tenderness, discharge or bleeding. No labial fusion. There is no rash, tenderness, lesion or injury on the right labia. There is no rash, tenderness, lesion or injury on the left labia. Uterus is not deviated, not enlarged, not fixed and not tender. Cervix exhibits no motion tenderness, no discharge and no friability. Right adnexum displays no mass, no tenderness and no fullness. Left adnexum displays no tenderness and no fullness. No vaginal discharge found.  Musculoskeletal: Normal range of motion. She exhibits no edema and no tenderness.  Lymphadenopathy:    She has no cervical adenopathy.  Neurological: She is alert and oriented to person, place, and time. She has normal reflexes. No cranial nerve deficit. Coordination normal.  Skin: Skin is warm and dry.  Psychiatric: She has a normal mood and affect. Her behavior is normal. Judgment and thought content normal.      Assessment & Plan:   1. Routine general medical examination at a health care facility Normal exam. Screenings addressed. She will return for fasting labs. - CBC with Differential; Future - Comprehensive metabolic panel; Future - Lipid panel; Future - TSH; Future  2. Pap smear for cervical cancer screening Normal exam. External genitalia without lesions, ulcerations, inflammation, warts or discharge. There is a small  external hemorrhoids. No cystocele, rectocele or prolapsed uterus. Speculum examination normal. Cervix without inflammation, lesions, growth, nodules or discharge noted. There was no bleeding. Vaginal walls also normal - pink and rugose without inflammation, discharge, ulcers or color changes. Bimanual exam also normal.  No tenderness noted with palpation of the uterus. No adnexal masses appreciated during exam.   3. Screen for colon cancer Needs screening colonoscopy. - Ambulatory referral to Gastroenterology  4. Screening for breast cancer Needs mammogram. Has not had one in > 3 years. Breast exam was normal without any palpable masses. - MM DIGITAL SCREENING BILATERAL; Future  5. Blood glucose elevated Check A1c given elevated blood glucose levels in the past. - Hemoglobin A1c; Future  6. Need for shingles vaccine Sent zoster vaccine to her pharmacy for administration. - zoster vaccine live, PF, (ZOSTAVAX) 16109 UNT/0.65ML injection;  Inject 19,400 Units into the skin once.  Dispense: 1 each; Refill: 0

## 2014-04-08 NOTE — Addendum Note (Signed)
Addended by: Karlene Einstein D on: 04/08/2014 11:54 AM   Modules accepted: Orders

## 2014-04-08 NOTE — Progress Notes (Signed)
Pre visit review using our clinic review tool, if applicable. No additional management support is needed unless otherwise documented below in the visit note. 

## 2014-04-08 NOTE — Patient Instructions (Addendum)
  You had your annual physical today including PAP. I will notify you of results once they are available.  I am referring you for your screening colonoscopy.  I have also placed an order for you to have your mammogram.   Please return tomorrow for your fasting labs.  I sent an order for your shingles vaccine to your pharmacy. Please find out what the cost to you will be prior to having them give it to you.

## 2014-04-09 ENCOUNTER — Encounter: Payer: Self-pay | Admitting: Adult Health

## 2014-04-09 LAB — WET PREP BY MOLECULAR PROBE
Candida species: NEGATIVE
Gardnerella vaginalis: NEGATIVE
Trichomonas vaginosis: NEGATIVE

## 2014-04-10 ENCOUNTER — Encounter: Payer: Self-pay | Admitting: Adult Health

## 2014-04-10 ENCOUNTER — Other Ambulatory Visit (INDEPENDENT_AMBULATORY_CARE_PROVIDER_SITE_OTHER): Payer: BC Managed Care – PPO

## 2014-04-10 DIAGNOSIS — R739 Hyperglycemia, unspecified: Secondary | ICD-10-CM

## 2014-04-10 DIAGNOSIS — Z0000 Encounter for general adult medical examination without abnormal findings: Secondary | ICD-10-CM

## 2014-04-10 DIAGNOSIS — R7309 Other abnormal glucose: Secondary | ICD-10-CM

## 2014-04-10 LAB — TSH: TSH: 0.75 u[IU]/mL (ref 0.35–4.50)

## 2014-04-10 LAB — LIPID PANEL
CHOLESTEROL: 219 mg/dL — AB (ref 0–200)
HDL: 49.2 mg/dL (ref >39.00)
LDL Cholesterol: 155 mg/dL — ABNORMAL HIGH (ref 0–99)
TRIGLYCERIDES: 73 mg/dL (ref 0.0–149.0)
Total CHOL/HDL Ratio: 4
VLDL: 14.6 mg/dL (ref 0.0–40.0)

## 2014-04-10 LAB — COMPREHENSIVE METABOLIC PANEL
ALT: 17 U/L (ref 0–35)
AST: 18 U/L (ref 0–37)
Albumin: 4.1 g/dL (ref 3.5–5.2)
Alkaline Phosphatase: 59 U/L (ref 39–117)
BILIRUBIN TOTAL: 0.8 mg/dL (ref 0.2–1.2)
BUN: 18 mg/dL (ref 6–23)
CALCIUM: 9.4 mg/dL (ref 8.4–10.5)
CO2: 28 mEq/L (ref 19–32)
CREATININE: 0.8 mg/dL (ref 0.4–1.2)
Chloride: 104 mEq/L (ref 96–112)
GFR: 79.46 mL/min (ref >60.00)
GLUCOSE: 100 mg/dL — AB (ref 70–99)
Potassium: 4.4 mEq/L (ref 3.5–5.1)
Sodium: 139 mEq/L (ref 135–145)
Total Protein: 7 g/dL (ref 6.0–8.3)

## 2014-04-10 LAB — CBC WITH DIFFERENTIAL/PLATELET
BASOS PCT: 0.6 % (ref 0.0–3.0)
Basophils Absolute: 0 10*3/uL (ref 0.0–0.1)
EOS ABS: 0.1 10*3/uL (ref 0.0–0.7)
EOS PCT: 1.6 % (ref 0.0–5.0)
HCT: 41.6 % (ref 36.0–46.0)
HEMOGLOBIN: 14.2 g/dL (ref 12.0–15.0)
Lymphocytes Relative: 30.2 % (ref 12.0–46.0)
Lymphs Abs: 1.7 10*3/uL (ref 0.7–4.0)
MCHC: 34.2 g/dL (ref 30.0–36.0)
MCV: 92.5 fl (ref 78.0–100.0)
Monocytes Absolute: 0.4 10*3/uL (ref 0.1–1.0)
Monocytes Relative: 7 % (ref 3.0–12.0)
NEUTROS ABS: 3.4 10*3/uL (ref 1.4–7.7)
Neutrophils Relative %: 60.6 % (ref 43.0–77.0)
Platelets: 228 10*3/uL (ref 150.0–400.0)
RBC: 4.49 Mil/uL (ref 3.87–5.11)
RDW: 11.6 % (ref 11.5–15.5)
WBC: 5.6 10*3/uL (ref 4.0–10.5)

## 2014-04-10 LAB — HEMOGLOBIN A1C: Hgb A1c MFr Bld: 4.8 % (ref 4.6–6.5)

## 2014-04-14 ENCOUNTER — Telehealth: Payer: Self-pay | Admitting: Adult Health

## 2014-04-14 NOTE — Telephone Encounter (Signed)
Unread messages

## 2014-04-15 NOTE — Telephone Encounter (Signed)
Patient did not answer. Voicemail left asking patient to check her Mychart messages or call the office to discuss recent labs. Call back number left on voicemail.

## 2014-04-17 NOTE — Telephone Encounter (Signed)
Pt read mychart messages 04/16/14

## 2014-06-12 ENCOUNTER — Encounter: Payer: Self-pay | Admitting: Adult Health

## 2014-07-09 ENCOUNTER — Encounter: Payer: Self-pay | Admitting: Adult Health

## 2014-08-12 ENCOUNTER — Other Ambulatory Visit: Payer: Self-pay | Admitting: Adult Health

## 2014-08-12 ENCOUNTER — Encounter: Payer: Self-pay | Admitting: Internal Medicine

## 2014-08-22 ENCOUNTER — Ambulatory Visit: Payer: Self-pay

## 2014-10-02 ENCOUNTER — Ambulatory Visit: Payer: BC Managed Care – PPO | Admitting: Internal Medicine

## 2014-11-18 ENCOUNTER — Encounter: Payer: Self-pay | Admitting: Internal Medicine

## 2014-11-18 ENCOUNTER — Ambulatory Visit (INDEPENDENT_AMBULATORY_CARE_PROVIDER_SITE_OTHER): Payer: BC Managed Care – PPO | Admitting: Internal Medicine

## 2014-11-18 VITALS — BP 120/74 | HR 84 | Temp 98.9°F | Resp 14 | Ht 60.0 in | Wt 176.0 lb

## 2014-11-18 DIAGNOSIS — F3175 Bipolar disorder, in partial remission, most recent episode depressed: Secondary | ICD-10-CM | POA: Insufficient documentation

## 2014-11-18 DIAGNOSIS — F319 Bipolar disorder, unspecified: Secondary | ICD-10-CM | POA: Insufficient documentation

## 2014-11-18 DIAGNOSIS — Z1211 Encounter for screening for malignant neoplasm of colon: Secondary | ICD-10-CM

## 2014-11-18 DIAGNOSIS — I10 Essential (primary) hypertension: Secondary | ICD-10-CM

## 2014-11-18 DIAGNOSIS — R194 Change in bowel habit: Secondary | ICD-10-CM

## 2014-11-18 DIAGNOSIS — N289 Disorder of kidney and ureter, unspecified: Secondary | ICD-10-CM

## 2014-11-18 DIAGNOSIS — R110 Nausea: Secondary | ICD-10-CM

## 2014-11-18 DIAGNOSIS — R300 Dysuria: Secondary | ICD-10-CM

## 2014-11-18 LAB — POCT URINALYSIS DIPSTICK
Bilirubin, UA: NEGATIVE
Glucose, UA: NEGATIVE
KETONES UA: NEGATIVE
Nitrite, UA: NEGATIVE
PROTEIN UA: NEGATIVE
Spec Grav, UA: 1.02
Urobilinogen, UA: 0.2
pH, UA: 6

## 2014-11-18 LAB — H. PYLORI ANTIBODY, IGG: H PYLORI IGG: NEGATIVE

## 2014-11-18 LAB — URINALYSIS, ROUTINE W REFLEX MICROSCOPIC
Bilirubin Urine: NEGATIVE
Ketones, ur: NEGATIVE
Nitrite: NEGATIVE
PH: 6 (ref 5.0–8.0)
SPECIFIC GRAVITY, URINE: 1.025 (ref 1.000–1.030)
Total Protein, Urine: NEGATIVE
URINE GLUCOSE: NEGATIVE
UROBILINOGEN UA: 0.2 (ref 0.0–1.0)

## 2014-11-18 LAB — COMPREHENSIVE METABOLIC PANEL
ALT: 18 U/L (ref 0–35)
AST: 17 U/L (ref 0–37)
Albumin: 4.1 g/dL (ref 3.5–5.2)
Alkaline Phosphatase: 81 U/L (ref 39–117)
BILIRUBIN TOTAL: 1.3 mg/dL — AB (ref 0.2–1.2)
BUN: 15 mg/dL (ref 6–23)
CALCIUM: 9.2 mg/dL (ref 8.4–10.5)
CHLORIDE: 99 meq/L (ref 96–112)
CO2: 29 mEq/L (ref 19–32)
CREATININE: 0.7 mg/dL (ref 0.4–1.2)
GFR: 88.39 mL/min (ref >60.00)
Glucose, Bld: 89 mg/dL (ref 70–99)
Potassium: 4.2 mEq/L (ref 3.5–5.1)
Sodium: 137 mEq/L (ref 135–145)
Total Protein: 6.7 g/dL (ref 6.0–8.3)

## 2014-11-18 LAB — LIPASE: Lipase: 24 U/L (ref 11.0–59.0)

## 2014-11-18 NOTE — Patient Instructions (Addendum)
Referral to [facility] for colonoscopy .  If your urine culture is positive I will call in an antibiotic  You can try taking OTC prilosec once daily to see if it helps your post prandial nausea

## 2014-11-18 NOTE — Progress Notes (Addendum)
Patient ID: Julia Cooke, female   DOB: [birth year], 63 y.o.   MRN: [id number]  Patient Active Problem List   Diagnosis Date Noted  . Change in stool habits 11/19/2014  . Bipolar disorder 11/18/2014  . Routine general medical examination at a health care facility 04/08/2014  . Pap smear for cervical cancer screening 04/08/2014  . Screen for colon cancer 04/08/2014  . Screening for breast cancer 04/08/2014  . Blood glucose elevated 04/08/2014  . Need for shingles vaccine 04/08/2014  . Dyspareunia 01/28/2014  . Vaginal dryness, menopausal 01/28/2014  . Fatigue 01/07/2014  . Decreased libido 01/07/2014  . Vaginal itching 01/07/2014  . Medication management 01/07/2014  . Dysuria 01/07/2014  . Insect bite 07/13/2013  . Sinusitis 05/02/2013  . Right knee pain 05/02/2013  . HTN (hypertension) 04/04/2013  . Depression 04/04/2013  . Mania 04/04/2013    Subjective:  CC:   Chief Complaint  Patient presents with  . Follow-up    blood pressure    HPI:   Julia Cooke is a 63 y.o. female who presents for  Follow up on blood pressure  Last seen in MAY 2015.  SHE HAS MULTIPLE COMPLAINTS TODAY:   Has burning with urination  Stools have become loose and are occurring in the early Am.  She is woken by fecal urgency and tenesmus  without cramping,  Has been occurring for the past 6 months, accompanied by decreased appetite and pot prandial nausea.  No dietary changes,  And no medication changes .  Prior bowel habits were one daily normally a formed brown stool every morning .  Stools are still brown, but not formed and there is a moderate amount of mucous in it.  No prior colonoscopy despite age. No FH of GI cancers or IBD.  She denies use of recreational drugs, has an occasional glass of wine,  No NSAIDs ,  No aspirin.  No recent or history of travel or camping.     Past Medical History  Diagnosis Date  . Depression   . Psychotic disorder     mania  . Hypertension   . Heart murmur    . Nephrolithiasis 2000    s/p lithotripsy    Past Surgical History  Procedure Laterality Date  . Lithotripsy  2000       The following portions of the patient's history were reviewed and updated as appropriate: Allergies, current medications, and problem list.    Review of Systems:   Patient denies headache, fevers, malaise, unintentional weight loss, skin rash, eye pain, sinus congestion and sinus pain, sore throat, dysphagia,  hemoptysis , cough, dyspnea, wheezing, chest pain, palpitations, orthopnea, edema, abdominal pain, nausea, melena, diarrhea, constipation, flank pain, dysuria, hematuria, urinary  Frequency, nocturia, numbness, tingling, seizures,  Focal weakness, Loss of consciousness,  Tremor, insomnia, depression, anxiety, and suicidal ideation.     History   Social History  . Marital Status: Married    Spouse Name: N/A    Number of Children: N/A  . Years of Education: N/A   Occupational History  . Not on file.   Social History Main Topics  . Smoking status: Never Smoker   . Smokeless tobacco: Never Used  . Alcohol Use: 1.2 oz/week    2 Glasses of wine per week     Comment: 2-3 times weekly  . Drug Use: No  . Sexual Activity: Yes    Birth Control/ Protection: Post-menopausal   Other Topics Concern  . Not on file  Social History Narrative   Patient lives at home with her husband of 9 years. They have 3 adult children (2 boys, 1 girl). They recently moved back to [place] in January. 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    Objective:  Filed Vitals:   11/18/14 1059  BP: 120/74  Pulse: 84  Temp: 98.9 F (37.2 C)  Resp: 14     General appearance: alert, cooperative and appears stated age Ears: normal TM's and external ear canals both ears Throat: lips, mucosa, and tongue normal; teeth and gums normal Neck: no adenopathy, no carotid bruit, supple, symmetrical, trachea  midline and thyroid not enlarged, symmetric, no tenderness/mass/nodules Back: symmetric, no curvature. ROM normal. No CVA tenderness. Lungs: clear to auscultation bilaterally Heart: regular rate and rhythm, S1, S2 normal, no murmur, click, rub or gallop Abdomen: soft, non-tender; bowel sounds normal; no masses,  no organomegaly Pulses: 2+ and symmetric Skin: Skin color, texture, turgor normal. No rashes or lesions Lymph nodes: Cervical, supraclavicular, and axillary nodes normal.  Assessment and Plan:   Problem List Items Addressed This Visit    Change in stool habits    Suspect microscopic colitis vs polyps vs occult GI cancer.   given concurrent postprandial nausea, will need EGD as well. Referral to GI  In process for EGD and colonoscopy    Dysuria - Primary    Urine dipstick  positive for  leukocytes, white blood cells, bacteria. Empiric ciprofloxacin prescrbied,     Relevant Medications      ciprofloxacin (CIPRO) tablet   Other Relevant Orders      Urinalysis, dipstick only      Urinalysis, Routine w reflex microscopic (Completed)      Urine Culture      POCT urinalysis dipstick (Completed)   HTN (hypertension)    Well controlled on current regimen. Renal function stable, no changes today.  Lab Results  Component Value Date   CREATININE 0.7 11/18/2014   Lab Results  Component Value Date   NA 137 11/18/2014   K 4.2 11/18/2014   CL 99 11/18/2014   CO2 29 11/18/2014       Nausea without vomiting    Lipase and H Pylori Ab test s were normal.  No weight loss noted,  Trial of OTC Prilosec.  GI referral underway.   Wt Readings from Last 3 Encounters:  11/18/14 176 lb (79.833 kg)  04/08/14 174 lb (78.926 kg)  01/28/14 177 lb (80.287 kg)   Lab Results  Component Value Date   LIPASE 24.0 11/18/2014   Lab Results  Component Value Date   ALT 18 11/18/2014   AST 17 11/18/2014   ALKPHOS 81 11/18/2014   BILITOT 1.3* 11/18/2014       Relevant Orders      Comp Met  (CMET) (Completed)      Lipase (Completed)      H. pylori antibody, IgG (Completed)      POCT urinalysis dipstick (Completed)    Other Visit Diagnoses    Encounter for screening colonoscopy        Relevant Orders       Ambulatory referral to Gastroenterology       POCT urinalysis dipstick (Completed)

## 2014-11-18 NOTE — Progress Notes (Signed)
Pre visit review using our clinic review tool, if applicable. No additional management support is needed unless otherwise documented below in the visit note. 

## 2014-11-19 ENCOUNTER — Encounter: Payer: Self-pay | Admitting: Internal Medicine

## 2014-11-19 DIAGNOSIS — R110 Nausea: Secondary | ICD-10-CM | POA: Insufficient documentation

## 2014-11-19 DIAGNOSIS — N289 Disorder of kidney and ureter, unspecified: Secondary | ICD-10-CM | POA: Insufficient documentation

## 2014-11-19 DIAGNOSIS — R194 Change in bowel habit: Secondary | ICD-10-CM | POA: Insufficient documentation

## 2014-11-19 MED ORDER — CIPROFLOXACIN HCL 250 MG PO TABS: 250.0000 mg | ORAL_TABLET | Freq: Two times a day (BID) | ORAL | Status: DC

## 2014-11-19 NOTE — Assessment & Plan Note (Signed)
Well controlled on current regimen. Renal function stable, no changes today.  Lab Results  Component Value Date   CREATININE 0.7 11/18/2014   Lab Results  Component Value Date   NA 137 11/18/2014   K 4.2 11/18/2014   CL 99 11/18/2014   CO2 29 11/18/2014

## 2014-11-19 NOTE — Addendum Note (Signed)
Addended by: Crecencio Mc on: 11/19/2014 10:54 AM   Modules accepted: Level of Service

## 2014-11-19 NOTE — Assessment & Plan Note (Signed)
Urine dipstick  positive for  leukocytes, white blood cells, bacteria. Empiric ciprofloxacin prescrbied,

## 2014-11-19 NOTE — Assessment & Plan Note (Signed)
Suspect microscopic colitis vs polyps vs occult GI cancer.   given concurrent postprandial nausea, will need EGD as well. Referral to GI  In process for EGD and colonoscopy

## 2014-11-19 NOTE — Assessment & Plan Note (Addendum)
Lipase and H Pylori Ab test s were normal.  No weight loss noted,  Trial of OTC Prilosec.  GI referral underway.   Wt Readings from Last 3 Encounters:  11/18/14 176 lb (79.833 kg)  04/08/14 174 lb (78.926 kg)  01/28/14 177 lb (80.287 kg)   Lab Results  Component Value Date   LIPASE 24.0 11/18/2014   Lab Results  Component Value Date   ALT 18 11/18/2014   AST 17 11/18/2014   ALKPHOS 81 11/18/2014   BILITOT 1.3* 11/18/2014

## 2014-11-20 LAB — URINE CULTURE

## 2014-11-21 ENCOUNTER — Other Ambulatory Visit: Payer: Self-pay | Admitting: Internal Medicine

## 2014-11-21 MED ORDER — SULFAMETHOXAZOLE-TRIMETHOPRIM 800-160 MG PO TABS: 1.0000 | ORAL_TABLET | Freq: Two times a day (BID) | ORAL | Status: DC

## 2014-11-26 ENCOUNTER — Telehealth: Payer: Self-pay | Admitting: Internal Medicine

## 2014-11-26 NOTE — Telephone Encounter (Signed)
Patient called requesting lab results cannot get into MY Chart called patient no answer left message to return call to office.

## 2014-11-26 NOTE — Telephone Encounter (Signed)
Notified patient of lab results as requested by patient also advised patient of probiotic to be taken with ABX.

## 2014-11-27 ENCOUNTER — Telehealth: Payer: Self-pay

## 2014-11-27 NOTE — Telephone Encounter (Signed)
[facility] called and is hoping for a call back regarding her antibiotics   Callback number - [phone]

## 2014-11-27 NOTE — Telephone Encounter (Signed)
Patient is aware of UTI and pharmacy verified septra DS.

## 2015-02-13 ENCOUNTER — Other Ambulatory Visit: Payer: Self-pay | Admitting: *Deleted

## 2015-02-13 ENCOUNTER — Telehealth: Payer: Self-pay | Admitting: Adult Health

## 2015-02-13 MED ORDER — METOPROLOL SUCCINATE ER 25 MG PO TB24: 25.0000 mg | ORAL_TABLET | Freq: Every day | ORAL | Status: DC

## 2015-02-13 NOTE — Telephone Encounter (Signed)
rx filled

## 2015-02-13 NOTE — Telephone Encounter (Signed)
metoprolol succinate (TOPROL-XL) 25 MG 24 hr tablet

## 2015-03-05 ENCOUNTER — Telehealth: Payer: Self-pay | Admitting: *Deleted

## 2015-03-05 MED ORDER — METOPROLOL SUCCINATE ER 25 MG PO TB24
25.0000 mg | ORAL_TABLET | Freq: Every day | ORAL | Status: DC
Start: 2015-03-05 — End: 2015-07-11

## 2015-03-05 NOTE — Telephone Encounter (Signed)
Script sent to wrong pharmacy on 02/13/15 refilled medication to correct pharmacy

## 2015-03-21 NOTE — Consult Note (Signed)
Brief Consult Note: Diagnosis: Bipolar affective disorder mixed episode with psychosis.   Patient was seen by consultant.   Consult note dictated.   Recommend further assessment or treatment.   Orders entered.   Comments: Julia Cooke has no psychiatric histgory. A week ago she became depressed with crying spells and trying to drawn herself twice in the toilet. Over the weekend she became preoccupied with religion and believed that she was the female part of god. She saw a psychiatrist 3 days ago who started her on Lithium. She was brought ot the ER with no improvement.   PLAN:  1. The patient does not meet criteria for IVC. I will terminate proceedings. Please discharge as appropriate.   2. The family insists to take her home. They will watch over her.   3. No insurance. The cost of medications important. We discontinue Lithium. Start Tegretol $4 at [facility] for mood stabilization Risperdal $12 at [facility] for psychosis and trazodone for sleep.   4. She has follow up appointment at [facility] on Wednesday.   5. The family knows to bring her back to the ER if no improvement or unsafe.   6. 7 days supply of medications from the hospital farmacy was provided..  Electronic Signatures: Orson Slick (MD)  (Signed 07-Mar-14 18:55)  Authored: Brief Consult Note   Last Updated: 07-Mar-14 18:55 by Orson Slick (MD)

## 2015-07-01 ENCOUNTER — Ambulatory Visit: Payer: Self-pay | Admitting: Nurse Practitioner

## 2015-07-11 ENCOUNTER — Ambulatory Visit (INDEPENDENT_AMBULATORY_CARE_PROVIDER_SITE_OTHER): Payer: PPO | Admitting: Nurse Practitioner

## 2015-07-11 ENCOUNTER — Encounter: Payer: Self-pay | Admitting: Nurse Practitioner

## 2015-07-11 ENCOUNTER — Encounter: Payer: Self-pay | Admitting: Psychiatry

## 2015-07-11 ENCOUNTER — Ambulatory Visit (INDEPENDENT_AMBULATORY_CARE_PROVIDER_SITE_OTHER): Payer: PPO | Admitting: Psychiatry

## 2015-07-11 VITALS — BP 138/88 | HR 102 | Temp 98.2°F | Ht 60.5 in | Wt 172.8 lb

## 2015-07-11 VITALS — BP 138/86 | HR 97 | Temp 98.3°F | Resp 14 | Ht 60.0 in | Wt 172.6 lb

## 2015-07-11 DIAGNOSIS — F3110 Bipolar disorder, current episode manic without psychotic features, unspecified: Secondary | ICD-10-CM

## 2015-07-11 DIAGNOSIS — I10 Essential (primary) hypertension: Secondary | ICD-10-CM | POA: Diagnosis not present

## 2015-07-11 MED ORDER — METOPROLOL SUCCINATE ER 25 MG PO TB24: 25.0000 mg | ORAL_TABLET | Freq: Every day | ORAL | Status: DC

## 2015-07-11 MED ORDER — ARIPIPRAZOLE 5 MG PO TABS: 5.0000 mg | ORAL_TABLET | Freq: Every day | ORAL | Status: DC

## 2015-07-11 MED ORDER — CLONAZEPAM 0.5 MG PO TABS: 0.5000 mg | ORAL_TABLET | Freq: Two times a day (BID) | ORAL | Status: DC | PRN
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
## 2015-07-11 NOTE — Progress Notes (Signed)
Patient ID: Julia Cooke, female    DOB: 18-Feb-1951  Age: 64 y.o. MRN: [id number]  CC: Medication Refill   HPI Julia Cooke presents for a follow up and medication refill for HTN. Pt is a former [facility] pt.   1) HTN- Running 120-130s / 80-90s at home  Pt watches salt in her diet  Pt is interested in using MyChart, but reports it is locked (will obtain number)  History Julia Cooke has a past medical history of Depression; Psychotic disorder; Hypertension; Heart murmur; Nephrolithiasis (2000); and Bipolar disorder.   She has past surgical history that includes Lithotripsy (2000).   Her family history includes Cancer in her sister; Fibromyalgia in her sister; Heart Problems in her sister; Heart attack in her brother; Heart disease in her brother; Hyperlipidemia in her father and mother; Hypertension in her brother, father, mother, sister, and sister; Stroke in her mother.She reports that she has never smoked. She has never used smokeless tobacco. She reports that she drinks about 1.2 oz of alcohol per week. She reports that she does not use illicit drugs.  Outpatient Prescriptions Prior to Visit  Medication Sig Dispense Refill  . ARIPiprazole (ABILIFY) 5 MG tablet Take 1 tablet (5 mg total) by mouth daily. 30 tablet 2  . clonazePAM (KLONOPIN) 0.5 MG tablet Take 1 tablet (0.5 mg total) by mouth 2 (two) times daily as needed for anxiety. 30 tablet 1  . Multiple Vitamin (MULTIVITAMIN) tablet Take 1 tablet by mouth daily.    . risperiDONE (RISPERDAL) 3 MG tablet Take 3 mg by mouth at bedtime. Take 1/2 tablet at bedtime    . ciprofloxacin (CIPRO) 250 MG tablet Take 1 tablet (250 mg total) by mouth 2 (two) times daily. 10 tablet 0  . metoprolol succinate (TOPROL-XL) 25 MG 24 hr tablet Take 1 tablet (25 mg total) by mouth daily. 90 tablet 1  . sulfamethoxazole-trimethoprim (SEPTRA DS) 800-160 MG per tablet Take 1 tablet by mouth 2 (two) times daily. 14 tablet 0   No facility-administered  medications prior to visit.    ROS Review of Systems  Constitutional: Negative for fever, chills, diaphoresis and fatigue.  Respiratory: Negative for cough, chest tightness and wheezing.   Cardiovascular: Negative for chest pain, palpitations and leg swelling.    Objective:  BP 138/86 mmHg  Pulse 97  Temp(Src) 98.3 F (36.8 C)  Resp 14  Ht 5' (1.524 m)  Wt 172 lb 9.6 oz (78.291 kg)  BMI 33.71 kg/m2  SpO2 95%  Physical Exam  Constitutional: She is oriented to person, place, and time. She appears well-developed and well-nourished. No distress.  HENT:  Head: Normocephalic and atraumatic.  Right Ear: External ear normal.  Left Ear: External ear normal.  Cardiovascular: Normal rate, regular rhythm and normal heart sounds.  Exam reveals no gallop and no friction rub.   No murmur heard. Pulmonary/Chest: Effort normal and breath sounds normal. No respiratory distress. She has no wheezes. She has no rales. She exhibits no tenderness.  Neurological: She is alert and oriented to person, place, and time. No cranial nerve deficit. She exhibits normal muscle tone. Coordination normal.  Skin: Skin is warm and dry. No rash noted. She is not diaphoretic.  Psychiatric: She has a normal mood and affect. Her behavior is normal. Judgment and thought content normal.   Assessment & Plan:   Julia Cooke was seen today for medication refill.  Diagnoses and all orders for this visit:  Essential hypertension  Other orders -  metoprolol succinate (TOPROL-XL) 25 MG 24 hr tablet; Take 1 tablet (25 mg total) by mouth daily.  I have discontinued Ms. Ruda's ciprofloxacin and sulfamethoxazole-trimethoprim. I am also having her maintain her risperiDONE, multivitamin, clonazePAM, ARIPiprazole, and metoprolol succinate.  Meds ordered this encounter  Medications  . metoprolol succinate (TOPROL-XL) 25 MG 24 hr tablet    Sig: Take 1 tablet (25 mg total) by mouth daily.    Dispense:  90 tablet    Refill:   1    Order Specific Question:  Supervising Provider    Answer:  Crecencio Mc [[id number]]     Follow-up: Return if symptoms worsen or fail to improve.

## 2015-07-11 NOTE — Assessment & Plan Note (Signed)
Well controlled still. Renal function stable from December. Gave pt mychart technical support number on [facility]. Refilled script for metoprolol x 6 months. Advised pt to set up a physical appointment for this year. Needs updated fasting labs.

## 2015-07-11 NOTE — Progress Notes (Signed)
Pre visit review using our clinic review tool, if applicable. No additional management support is needed unless otherwise documented below in the visit note. 

## 2015-07-11 NOTE — Patient Instructions (Signed)
MyChart technical support line at [phone])   Please follow up for your yearly physical with updated labs. You can make this appointment at check out.

## 2015-09-12 ENCOUNTER — Ambulatory Visit: Payer: Self-pay | Admitting: Psychiatry

## 2015-10-10 ENCOUNTER — Ambulatory Visit: Payer: PPO | Admitting: Psychiatry

## 2015-10-31 ENCOUNTER — Encounter: Payer: Self-pay | Admitting: Psychiatry

## 2015-10-31 ENCOUNTER — Ambulatory Visit (INDEPENDENT_AMBULATORY_CARE_PROVIDER_SITE_OTHER): Payer: PPO | Admitting: Psychiatry

## 2015-10-31 VITALS — BP 122/88 | HR 100 | Temp 98.6°F | Ht 60.0 in | Wt 186.6 lb

## 2015-10-31 DIAGNOSIS — F3110 Bipolar disorder, current episode manic without psychotic features, unspecified: Secondary | ICD-10-CM | POA: Diagnosis not present

## 2015-10-31 MED ORDER — ARIPIPRAZOLE 5 MG PO TABS: 5.0000 mg | ORAL_TABLET | Freq: Every day | ORAL | Status: DC

## 2015-10-31 NOTE — Progress Notes (Signed)
[place] MD/PA/NP OP Progress Note  10/31/2015 10:00 AM Julia Cooke  MRN:  [id number]  Subjective:  Patient returns for follow-up of her bipolar disorder. She states today that she feels like she is not as invested in the holidays, non-motivated to do anything and just feels stuck. I asked her whether the timing of this was when the holidays began or whether it started before then. She states that before Thanksgiving she was fine. 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 She states that she's found that when she does not have to travel with him and get up early in the morning that she'll sleep in until 7 in the morning as opposed to when she is traveling with him they get up at 4 in the morning. We discussed that perhaps the issue was more a lack of structure and focus during the day than being in a major depressive episode. She denies any suicidal ideation. She states that she has gained weight.  I discussed that she could always try an antidepressant. Patient was hesitant to take an antidepressant and she states she does not want her answers to just be a pill. I discussed perhaps trying some therapy to develop some coping skills might be helpful given that she does not want to start any antidepressant at this time. Chief Complaint: Not interested in the holiday Chief Complaint    Follow-up; Medication Refill     Visit Diagnosis:     ICD-9-CM ICD-10-CM   1. Bipolar I disorder, most recent episode (or current) manic ([place]) 296.40 F31.10 ARIPiprazole (ABILIFY) 5 MG tablet    Past Medical History:  Past Medical History  Diagnosis Date  . Depression   . Psychotic disorder     mania  . Hypertension   . Heart murmur   . Nephrolithiasis 2000    s/p lithotripsy  . Bipolar disorder [facility])     Past Surgical History  Procedure Laterality Date  . Lithotripsy  2000   Family History:  Family History  Problem Relation Age of Onset  . Hyperlipidemia  Mother   . Stroke Mother   . Hypertension Mother   . Hyperlipidemia Father   . Hypertension Father   . Cancer Sister     breast  . Heart disease Brother     myocardial infarction  . Heart attack Brother   . Hypertension Brother   . Fibromyalgia Sister   . Hypertension Sister   . Heart Problems Sister   . Hypertension Sister    Social History:  Social History   Social History  . Marital Status: Married    Spouse Name: N/A  . Number of Children: N/A  . Years of Education: N/A   Social History Main Topics  . Smoking status: Never Smoker   . Smokeless tobacco: Never Used  . Alcohol Use: 0.0 - 2.4 oz/week    0 Shots of liquor, 0-2 Glasses of wine, 0-2 Cans of beer per week     Comment: 2-3 times weekly  . Drug Use: No  . Sexual Activity: Yes    Birth Control/ Protection: Post-menopausal, None   Other Topics Concern  . None   Social History Narrative   Patient lives at home with her husband of 32 years. They have 3 adult children (2 boys, 1 girl). They recently moved back to [place] in January. 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 Patient has also worked  as a Scientist, research (medical).   Additional History:   Assessment:   Musculoskeletal: Strength & Muscle Tone: within normal limits Gait & Station: normal Patient leans: N/A  Psychiatric Specialty Exam: HPI  Review of Systems  Psychiatric/Behavioral: Positive for depression (Does appear this may be more an issue with social setting and lack of structure.). Negative for suicidal ideas, hallucinations, memory loss and substance abuse. The patient is not nervous/anxious and does not have insomnia.   All other systems reviewed and are negative.   Blood pressure 122/88, pulse 100, temperature 98.6 F (37 C), temperature source Tympanic, height 5' (1.524 m), weight 186 lb 9.6 oz (84.641 kg), SpO2 96 %.Body mass index is 36.44 kg/(m^2).  General Appearance: Neat and Well Groomed  Eye Contact:  Good   Speech:  Normal Rate  Volume:  Normal  Mood:  Down  Affect:  Constricted  Thought Process:  Linear  Orientation:  Full (Time, Place, and Person)  Thought Content:  Negative  Suicidal Thoughts:  No  Homicidal Thoughts:  No  Memory:  Immediate;   Good Recent;   Good Remote;   Good  Judgement:  Good  Insight:  Good  Psychomotor Activity:  Negative  Concentration:  Good  Recall:  Good  Fund of Knowledge: Good  Language: Good  Akathisia:  Negative  Handed:   AIMS (if indicated):  On 07/11/2015 and was normal   Assets:  Communication Skills Desire for Improvement  ADL's:  Intact  Cognition: WNL  Sleep:  Increased when nothing is scheduled   Is the patient at risk to self?  No. Has the patient been a risk to self in the past 6 months?  No. Has the patient been a risk to self within the distant past?  Yes.   Is the patient a risk to others?  No. Has the patient been a risk to others in the past 6 months?  No. Has the patient been a risk to others within the distant past?  No.  Current Medications: Current Outpatient Prescriptions  Medication Sig Dispense Refill  . ARIPiprazole (ABILIFY) 5 MG tablet Take 1 tablet (5 mg total) by mouth daily. 30 tablet 3  . clonazePAM (KLONOPIN) 0.5 MG tablet Take 1 tablet (0.5 mg total) by mouth 2 (two) times daily as needed for anxiety. 30 tablet 1  . metoprolol succinate (TOPROL-XL) 25 MG 24 hr tablet Take 1 tablet (25 mg total) by mouth daily. 90 tablet 1  . Multiple Vitamin (MULTIVITAMIN) tablet Take 1 tablet by mouth daily.     No current facility-administered medications for this visit.    Medical Decision Making:  Established Problem, Stable/Improving (1) and Review of Medication Regimen & Side Effects (2)  Treatment Plan Summary:Medication management and Plan   Bipolar disorder type I- We'll continue her on her Abilify 5 mg daily. I discussed Ryan's antidepressant to address her issues with lack of motivation, lack of interest and  enjoyment. However patient does not want to start an antidepressant at this time. She would prefer to try therapy. Thus I have recommended her for therapy. 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  Anxiety-and 10 you clonazepam 0.5 mg twice daily as needed for anxiety. Dates that she does not take this on a regular basis because she does not want to become dependent on it. She states she'll only use it when she has severe anxiety. She believes she is used it 3  times since it was prescribed.   Faith Rogue 10/31/2015, 10:00 AM

## 2015-10-31 NOTE — Patient Instructions (Signed)
Make appointment with therapist in this clinic.

## 2015-12-04 ENCOUNTER — Ambulatory Visit (INDEPENDENT_AMBULATORY_CARE_PROVIDER_SITE_OTHER): Payer: PPO | Admitting: Licensed Clinical Social Worker

## 2015-12-04 DIAGNOSIS — F3110 Bipolar disorder, current episode manic without psychotic features, unspecified: Secondary | ICD-10-CM

## 2015-12-04 NOTE — Progress Notes (Signed)
Patient:   Julia Cooke   DOB:   10/27/1951  MR Number:  [id number]  Location:  [facility] [address] [place] [address] Dept: [phone]           Date of Service:   12/04/2015  Start Time:   10a End Time:   11a  Provider/Observer:  Lubertha South Counselor       Billing Code/Service: [phone]  Behavioral Observation: Julia Cooke  presents as a 65 y.o.-year-old Caucasian Female who appeared her stated age. her dress was Appropriate and she was Casual and her manners were Appropriate to the situation.  There were not any physical disabilities noted.  she displayed an appropriate level of cooperation and motivation.    Interactions:    Active   Attention:   within normal limits  Memory:   within normal limits  Speech (Volume):  normal  Speech:   normal pitch and normal volume  Thought Process:  Coherent and Relevant  Though Content:  WNL  Orientation:   person, place, time/date and situation  Judgment:   Fair  Planning:   Fair  Affect:    Tearful  Mood:    Depressed  Insight:   Fair  Intelligence:   normal  Chief Complaint:     Chief Complaint  Patient presents with  . Depression  . Anxiety  . Establish Care    Reason for Service:  "My doctor referred me"  Current Symptoms:  Overwhelmed, tired, reports that she keeps the family up but lately hasn't felt like doing anything, on medication for the past 2 years (abilify), does not feel joyful, afraid to do anything, lacks motivation, feels like a failure, financial concerns  Source of Distress:             Thinking about lack of motivation  Marital Status/Living: Married for 43 years (has a good relationship with husband).  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  Reports that high school was "okay.  I was above average.  I was also in a signing group throughout high school."  Legal History:  Denies  Careers adviser:  Denies    Religious/Spiritual Preferences:  Raised Catholic converted to [employer]  Family/Childhood History:                           Born in [place]; raised by both parents, has 9 siblings, mother was disciplinarian; father worked 2nd shift.  Both parents deceased.  Describes childhood as "3rd to the oldest.  Watched over the younger ones.  I did not like being at home.  I was in a singing group.  I was always at my friend's house. We had to work to get our school clothes."   Children/Grand-children:    Julia Cooke. 10, [address], Julia Cooke 38/has 5 grandchildren age range 25-7.  Natural/Informal Support:                         Myself and God.  My husband is supportive Julia Cooke [phone])   Substance Use:  No concerns of substance abuse are reported.  Medical History:   Past Medical History  Diagnosis Date  . Depression   . Psychotic disorder     mania  . Hypertension   . Heart murmur   . Nephrolithiasis 2000    s/p lithotripsy  . Bipolar disorder ([place])           Medication List       This list is accurate as of: 12/04/15 10:07 AM.  Always use your most recent med list.               ARIPiprazole 5 MG tablet  Commonly known as:  ABILIFY  Take 1 tablet (5 mg total) by mouth daily.     clonazePAM 0.5 MG tablet  Commonly known as:  KLONOPIN  Take 1 tablet (0.5 mg total) by mouth 2 (two) times daily as needed for anxiety.     metoprolol succinate 25 MG 24 hr tablet  Commonly known as:  TOPROL-XL  Take 1 tablet (25 mg total) by mouth daily.     multivitamin tablet  Take 1 tablet by mouth daily.              Sexual History:   History  Sexual Activity  . Sexual Activity:  Yes  . Birth Control/ Protection: Post-menopausal, None     Abuse/Trauma History: Denies   Psychiatric History:  Dr. Jimmye Norman for the past few months; attended sessions at [facility] (discharged due to insurance)   Strengths:   Joyful, persistent, try to be there for everyone   Recovery Goals:  "I don't know.  To find myself again."  Hobbies/Interests:               I don't have ambition to do things.  I use to sew and do gardening, exercise, I like being around people, my work, I like to see myself accomplish things   Challenges/Barriers: "I get up in the morning and I don't have any direction.  Its like I don't have a purpose.  I try to stay focused and be their for my family but I think I fail at that."    Family Med/Psych History:  Family History  Problem Relation Age of Onset  . Hyperlipidemia Mother   . Stroke Mother   . Hypertension Mother   . Hyperlipidemia Father   . Hypertension Father   . Cancer Sister     breast  . Heart disease Brother     myocardial infarction  . Heart attack Brother   . Hypertension Brother   . Fibromyalgia Sister   . Hypertension Sister   . Heart Problems Sister   . Hypertension Sister     Risk of Suicide/Violence: low   History of Suicide/Violence:  passive thoughts; occasionally; does not have a plan  Psychosis:   Denies   Diagnosis:    Bipolar I disorder, most recent episode (or current) manic ([place])   Recommendation/Plan: Writer recommends Outpatient Therapy at least twice monthly to include but not limited to individual, group and or family therapy.  Medication Management is also recommended to assist with her mood.

## 2015-12-19 ENCOUNTER — Ambulatory Visit: Payer: PPO | Admitting: Licensed Clinical Social Worker

## 2015-12-24 ENCOUNTER — Ambulatory Visit (INDEPENDENT_AMBULATORY_CARE_PROVIDER_SITE_OTHER): Payer: PPO | Admitting: Internal Medicine

## 2015-12-24 ENCOUNTER — Encounter: Payer: Self-pay | Admitting: Internal Medicine

## 2015-12-24 ENCOUNTER — Encounter: Payer: PPO | Admitting: Internal Medicine

## 2015-12-24 VITALS — BP 118/76 | HR 85 | Temp 98.1°F | Resp 12 | Ht 60.0 in | Wt 185.5 lb

## 2015-12-24 DIAGNOSIS — Z23 Encounter for immunization: Secondary | ICD-10-CM

## 2015-12-24 DIAGNOSIS — J069 Acute upper respiratory infection, unspecified: Secondary | ICD-10-CM

## 2015-12-24 DIAGNOSIS — B9789 Other viral agents as the cause of diseases classified elsewhere: Secondary | ICD-10-CM

## 2015-12-24 DIAGNOSIS — I10 Essential (primary) hypertension: Secondary | ICD-10-CM

## 2015-12-24 DIAGNOSIS — R7301 Impaired fasting glucose: Secondary | ICD-10-CM

## 2015-12-24 DIAGNOSIS — E559 Vitamin D deficiency, unspecified: Secondary | ICD-10-CM

## 2015-12-24 DIAGNOSIS — R194 Change in bowel habit: Secondary | ICD-10-CM

## 2015-12-24 DIAGNOSIS — Z1239 Encounter for other screening for malignant neoplasm of breast: Secondary | ICD-10-CM

## 2015-12-24 DIAGNOSIS — Z1159 Encounter for screening for other viral diseases: Secondary | ICD-10-CM

## 2015-12-24 DIAGNOSIS — E785 Hyperlipidemia, unspecified: Secondary | ICD-10-CM

## 2015-12-24 DIAGNOSIS — Z0000 Encounter for general adult medical examination without abnormal findings: Secondary | ICD-10-CM

## 2015-12-24 DIAGNOSIS — R05 Cough: Secondary | ICD-10-CM | POA: Diagnosis not present

## 2015-12-24 DIAGNOSIS — R059 Cough, unspecified: Secondary | ICD-10-CM

## 2015-12-24 DIAGNOSIS — E669 Obesity, unspecified: Secondary | ICD-10-CM

## 2015-12-24 DIAGNOSIS — R5383 Other fatigue: Secondary | ICD-10-CM

## 2015-12-24 NOTE — Progress Notes (Signed)
Pre-visit discussion using our clinic review tool. No additional management support is needed unless otherwise documented below in the visit note.  

## 2015-12-24 NOTE — Patient Instructions (Addendum)
I am prescribing a cough medication to take in the evening ,  it has codeine in it   Return for fasting labs ASAP  Mammogram will be set up  Go get a chest x ray at [facility] (ordered,  No appointment needed)   This is my  example of a  "Low GI"  Diet:  It will allow you to lose 4 to 8  lbs  per month if you follow it carefully.  Your goal with exercise is a minimum of 30 minutes of aerobic exercise 5 days per week (Walking does not count once it becomes easy!)    All of the foods can be found at grocery stores and in bulk at [employer].  The Atkins protein bars and shakes are available in more varieties at Target, [employer] and [place].     7 AM Breakfast:  Choose from the following:  Low carbohydrate Protein  Shakes (I recommend the EAS AdvantEdge "Carb Control" shakes  Or the low carb shakes by Atkins.    2.5 carbs)   Arnold's "Sandwhich Thin"toasted  w/ peanut butter (no jelly: about 20 net carbs  "Bagel Thin" with cream cheese and salmon: about 20 carbs   a scrambled egg/bacon/cheese burrito made with Mission's "carb balance" whole wheat tortilla  (about 10 net carbs )  Bj's sells a frittata (basically a quiche without the pastry crust) that is eaten cold and very convenient way to get your eggs  If you make your own shakes, avoid bananas and pineapple,  And use low carb greek yogurt or almond milk    Avoid cereal and bananas, oatmeal and cream of wheat and grits. They are loaded with carbohydrates!   10 AM: high protein snack:  Protein bar by Atkins (the snack size, under 200 cal, usually < 6 net carbs).    A stick of cheese:  Around 1 carb,  100 cal     Dannon Light n Fit [place] Yogurt  (80 cal, 8 carbs)  Other so called "protein bars" and Greek yogurts tend to be loaded with carbohydrates.  Remember, in food advertising, the word "energy" is synonymous for " carbohydrate."  Lunch:   A Sandwich using the bread choices listed, Can use any  Eggs,  lunchmeat, grilled meat or canned  tuna), avocado, regular mayo/mustard  and cheese.  A Salad using blue cheese, ranch,  Goddess or vinagrette,  Avoid taco shells, croutons or "confetti" and no "candied nuts" but regular nuts OK.   No pretzels or chips.  Pickles and miniature sweet peppers are a good low carb alternative that provide a "crunch"  The bread is the only source of carbohydrate in a sandwich and  can be decreased by trying some of these alternatives to traditional loaf bread  Joseph's makes a pita bread and a flat bread that are 50 cal and 4 net carbs available at [place] and [place].  This can be toasted to use with hummous as well  Toufayan makes a low carb flatbread that's 100 cal and 9 net carbs available at [employer] and BJ's makes 2 sizes of  Low carb whole wheat tortilla  (The large one is 210 cal and 6 net carbs)  Ezekiel bread is a loaf bread sold in the frozen section of higher end grocery chains,  Very low carb  Avoid "Low fat dressings, as well as Barry Brunner and [place] dressings They are loaded with sugar!   3 PM/ Mid day  Snack:  Consider  1 ounce of  almonds, walnuts, pistachios, pecans, peanuts,  Macadamia nuts or a nut medley.  Avoid "granola"; the dried cranberries and raisins are loaded with carbohydrates. Mixed nuts as long as there are no raisins,  cranberries or dried fruit.    Try the prosciutto/mozzarella cheese sticks by Fiorruci  In deli /backery section   High protein      6 PM  Dinner:     Meat/fowl/fish with a green salad, and either broccoli, cauliflower, green beans, spinach, brussel sprouts or  Lima beans. DO NOT BREAD THE PROTEIN!!      There is a low carb pasta by Dreamfield's that is acceptable and tastes great: only 5 digestible carbs/serving.( All grocery stores but BJs carry it )  Try Hurley Cisco Angelo's chicken piccata or chicken or eggplant parm over low carb pasta.(Lowes and BJs)   Marjory Lies Sanchez's "Carnitas" (pulled pork, no sauce,  0 carbs) or his beef pot roast to  make a dinner burrito (at BJ's)  Pesto over low carb pasta (bj's sells a good quality pesto in the center refrigerated section of the deli   Try satueeing  Cheral Marker with mushroooms  Whole wheat pasta is still full of digestible carbs and  Not as low in glycemic index as Dreamfield's.   Brown rice is still rice,  So skip the rice and noodles if you eat [place] or [place] (or at least limit to 1/2 cup)  9 PM snack :   Breyer's "low carb" fudgsicle or  ice cream bar (Carb Smart line), or  Weight Watcher's ice cream bar , or another "no sugar added" ice cream;  a serving of fresh berries/cherries with whipped cream   Cheese or DANNON'S LlGHT N FIT GREEK YOGURT  8 ounces of [place] unsweetened almond/cococunut milk    Treat yourself to a parfait made with whipped cream blueberiies, walnuts and vanilla greek yogurt  Avoid bananas, pineapple, grapes  and watermelon on a regular basis because they are high in sugar.  THINK OF THEM AS DESSERT  Remember that snack Substitutions should be less than 10 NET carbs per serving and meals < 20 carbs. Remember to subtract fiber grams to get the "net carbs."  Menopause is a normal process in which your reproductive ability comes to an end. This process happens gradually over a span of months to years, usually between the ages of 72 and 67. Menopause is complete when you have missed 12 consecutive menstrual periods. It is important to talk with your health care provider about some of the most common conditions that affect postmenopausal women, such as heart disease, cancer, and bone loss (osteoporosis). Adopting a healthy lifestyle and getting preventive care can help to promote your health and wellness. Those actions can also lower your chances of developing some of these common conditions. WHAT SHOULD I KNOW ABOUT MENOPAUSE? During menopause, you may experience a number of symptoms, such as:  Moderate-to-severe hot flashes.  Night sweats.  Decrease in sex  drive.  Mood swings.  Headaches.  Tiredness.  Irritability.  Memory problems.  Insomnia. Choosing to treat or not to treat menopausal changes is an individual decision that you make with your health care provider. WHAT SHOULD I KNOW ABOUT HORMONE REPLACEMENT THERAPY AND SUPPLEMENTS? Hormone therapy products are effective for treating symptoms that are associated with menopause, such as hot flashes and night sweats. Hormone replacement carries certain risks, especially as you become older. If you are thinking about using estrogen  or estrogen with progestin treatments, discuss the benefits and risks with your health care provider. WHAT SHOULD I KNOW ABOUT HEART DISEASE AND STROKE? Heart disease, heart attack, and stroke become more likely as you age. This may be due, in part, to the hormonal changes that your body experiences during menopause. These can affect how your body processes dietary fats, triglycerides, and cholesterol. Heart attack and stroke are both medical emergencies. There are many things that you can do to help prevent heart disease and stroke:  Have your blood pressure checked at least every 1-2 years. High blood pressure causes heart disease and increases the risk of stroke.  If you are 19-15 years old, ask your health care provider if you should take aspirin to prevent a heart attack or a stroke.  Do not use any tobacco products, including cigarettes, chewing tobacco, or electronic cigarettes. If you need help quitting, ask your health care provider.  It is important to eat a healthy diet and maintain a healthy weight.  Be sure to include plenty of vegetables, fruits, low-fat dairy products, and lean protein.  Avoid eating foods that are high in solid fats, added sugars, or salt (sodium).  Get regular exercise. This is one of the most important things that you can do for your health.  Try to exercise for at least 150 minutes each week. The type of exercise that you  do should increase your heart rate and make you sweat. This is known as moderate-intensity exercise.  Try to do strengthening exercises at least twice each week. Do these in addition to the moderate-intensity exercise.  Know your numbers.Ask your health care provider to check your cholesterol and your blood glucose. Continue to have your blood tested as directed by your health care provider. WHAT SHOULD I KNOW ABOUT CANCER SCREENING? There are several types of cancer. Take the following steps to reduce your risk and to catch any cancer development as early as possible. Breast Cancer  Practice breast self-awareness.  This means understanding how your breasts normally appear and feel.  It also means doing regular breast self-exams. Let your health care provider know about any changes, no matter how small.  If you are 57 or older, have a clinician do a breast exam (clinical breast exam or CBE) every year. Depending on your age, family history, and medical history, it may be recommended that you also have a yearly breast X-ray (mammogram).  If you have a family history of breast cancer, talk with your health care provider about genetic screening.  If you are at high risk for breast cancer, talk with your health care provider about having an MRI and a mammogram every year.  Breast cancer (BRCA) gene test is recommended for women who have family members with BRCA-related cancers. Results of the assessment will determine the need for genetic counseling and BRCA1 and for BRCA2 testing. BRCA-related cancers include these types:  Breast. This occurs in males or females.  Ovarian.  Tubal. This may also be called fallopian tube cancer.  Cancer of the abdominal or pelvic lining (peritoneal cancer).  Prostate.  Pancreatic. Cervical, Uterine, and Ovarian Cancer Your health care provider may recommend that you be screened regularly for cancer of the pelvic organs. These include your ovaries,  uterus, and vagina. This screening involves a pelvic exam, which includes checking for microscopic changes to the surface of your cervix (Pap test).  For women ages 21-65, health care providers may recommend a pelvic exam and a Pap test  every three years. For women ages 66-65, they may recommend the Pap test and pelvic exam, combined with testing for human papilloma virus (HPV), every five years. Some types of HPV increase your risk of cervical cancer. Testing for HPV may also be done on women of any age who have unclear Pap test results.  Other health care providers may not recommend any screening for nonpregnant women who are considered low risk for pelvic cancer and have no symptoms. Ask your health care provider if a screening pelvic exam is right for you.  If you have had past treatment for cervical cancer or a condition that could lead to cancer, you need Pap tests and screening for cancer for at least 20 years after your treatment. If Pap tests have been discontinued for you, your risk factors (such as having a new sexual partner) need to be reassessed to determine if you should start having screenings again. Some women have medical problems that increase the chance of getting cervical cancer. In these cases, your health care provider may recommend that you have screening and Pap tests more often.  If you have a family history of uterine cancer or ovarian cancer, talk with your health care provider about genetic screening.  If you have vaginal bleeding after reaching menopause, tell your health care provider.  There are currently no reliable tests available to screen for ovarian cancer. Lung Cancer Lung cancer screening is recommended for adults 67-11 years old who are at high risk for lung cancer because of a history of smoking. A yearly low-dose CT scan of the lungs is recommended if you:  Currently smoke.  Have a history of at least 30 pack-years of smoking and you currently smoke or have  quit within the past 15 years. A pack-year is smoking an average of one pack of cigarettes per day for one year. Yearly screening should:  Continue until it has been 15 years since you quit.  Stop if you develop a health problem that would prevent you from having lung cancer treatment. Colorectal Cancer  This type of cancer can be detected and can often be prevented.  Routine colorectal cancer screening usually begins at age 26 and continues through age 55.  If you have risk factors for colon cancer, your health care provider may recommend that you be screened at an earlier age.  If you have a family history of colorectal cancer, talk with your health care provider about genetic screening.  Your health care provider may also recommend using home test kits to check for hidden blood in your stool.  A small camera at the end of a tube can be used to examine your colon directly (sigmoidoscopy or colonoscopy). This is done to check for the earliest forms of colorectal cancer.  Direct examination of the colon should be repeated every 5-10 years until age 54. However, if early forms of precancerous polyps or small growths are found or if you have a family history or genetic risk for colorectal cancer, you may need to be screened more often. Skin Cancer  Check your skin from head to toe regularly.  Monitor any moles. Be sure to tell your health care provider:  About any new moles or changes in moles, especially if there is a change in a mole's shape or color.  If you have a mole that is larger than the size of a pencil eraser.  If any of your family members has a history of skin cancer, especially at a  young age, talk with your health care provider about genetic screening.  Always use sunscreen. Apply sunscreen liberally and repeatedly throughout the day.  Whenever you are outside, protect yourself by wearing long sleeves, pants, a wide-brimmed hat, and sunglasses. WHAT SHOULD I KNOW  ABOUT OSTEOPOROSIS? Osteoporosis is a condition in which bone destruction happens more quickly than new bone creation. After menopause, you may be at an increased risk for osteoporosis. To help prevent osteoporosis or the bone fractures that can happen because of osteoporosis, the following is recommended:  If you are 54-24 years old, get at least 1,000 mg of calcium and at least 600 mg of vitamin D per day.  If you are older than age 59 but younger than age 42, get at least 1,200 mg of calcium and at least 600 mg of vitamin D per day.  If you are older than age 72, get at least 1,200 mg of calcium and at least 800 mg of vitamin D per day. Smoking and excessive alcohol intake increase the risk of osteoporosis. Eat foods that are rich in calcium and vitamin D, and do weight-bearing exercises several times each week as directed by your health care provider. WHAT SHOULD I KNOW ABOUT HOW MENOPAUSE AFFECTS [place]? Depression may occur at any age, but it is more common as you become older. Common symptoms of depression include:  Low or sad mood.  Changes in sleep patterns.  Changes in appetite or eating patterns.  Feeling an overall lack of motivation or enjoyment of activities that you previously enjoyed.  Frequent crying spells. Talk with your health care provider if you think that you are experiencing depression. WHAT SHOULD I KNOW ABOUT IMMUNIZATIONS? It is important that you get and maintain your immunizations. These include:  Tetanus, diphtheria, and pertussis (Tdap) booster vaccine.  Influenza every year before the flu season begins.  Pneumonia vaccine.  Shingles vaccine. Your health care provider may also recommend other immunizations.   This information is not intended to replace advice given to you by your health care provider. Make sure you discuss any questions you have with your health care provider.   Document Released: 01/07/2006 Document Revised: 12/06/2014  Document Reviewed: 07/18/2014 Elsevier Interactive Patient Education [employer].

## 2015-12-24 NOTE — Progress Notes (Signed)
Patient ID: Julia Cooke, female    DOB: 06-Dec-1950  Age: 65 y.o. MRN: [id number]  The patient is here for annual  wellness examination and management of other chronic and acute problems.  Health Maintenance Reviewed:  No prior  Colonoscopy, prefers to use nonvasive means with Cologuard Last mammogram done over 3 years ago in [place] by Dr.  Janyth Cooke  [facility], [place]  PAP smear normal 2015    Family history reviewed:  Brother had AMI at 58 and died (smoker) Mother had CVA at 17 and died (HTN, smoker)   The risk factors are reflected in the social history.  The roster of all physicians providing medical care to patient - is listed in the Snapshot section of the chart.  Activities of daily living:  The patient is 100% independent in all ADLs: dressing, toileting, feeding as well as independent mobility  Home safety : The patient has smoke detectors in the home. They wear seatbelts.  There are no firearms at home. There is no violence in the home.   There is no risks for hepatitis, STDs or HIV. There is no   history of blood transfusion. They have no travel history to infectious disease endemic areas of the world.  The patient has seen their dentist in the last six month. They have seen their eye doctor in the last year. They admit to slight hearing difficulty with regard to whispered voices and some television programs.  They have deferred audiologic testing in the last year.  They do not  have excessive sun exposure. Discussed the need for sun protection: hats, long sleeves and use of sunscreen if there is significant sun exposure.   Diet: the importance of a healthy diet is discussed. They do have a healthy diet.  The benefits of regular aerobic exercise were discussed. She does not exercise and gets very short of breath with walkign dog.  Has gained 15 lbs in c the last year.  .   Depression screen: there are no signs or vegative symptoms of depression-  irritability, change in appetite, anhedonia, sadness/tearfullness.  Cognitive assessment: the patient manages all their financial and personal affairs and is actively engaged. They could relate day,date,year and events; recalled 2/3 objects at 3 minutes; performed clock-face test normally.  The following portions of the patient's history were reviewed and updated as appropriate: allergies, current medications, past family history, past medical history,  past surgical history, past social history  and problem list.  Visual acuity was not assessed per patient preference since she has regular follow up with her ophthalmologist. Hearing and body mass index were assessed and reviewed.   During the course of the visit the patient was educated and counseled about appropriate screening and preventive services including : fall prevention , diabetes screening, nutrition counseling, colorectal cancer screening, and recommended immunizations.    CC: The primary encounter diagnosis was Essential hypertension. Diagnoses of Cough, Need for hepatitis C screening test, Screening for breast cancer, Vitamin D deficiency, Other fatigue, Encounter for immunization, Change in stool habits, Impaired fasting glucose, Viral URI with cough, Obesity, Routine general medical examination at a health care facility, and Hyperlipidemia LDL goal <100 were also pertinent to this visit.  Cough x 4 weeks started with URi symptoms at Christmas, feels more like bronchitis,  Cough is worse at night,  Clearing throat a lot,  No nasal drainage/clear drainage ,  Not using any OTC meds .  No history of tobacco abuse,  Had  bronchitis as a child,  Tested for asthma 3 years ago and rescribed an MDI but couldn't use inhalers properly so they were discontinued  [facility])   History of chronic diarrhea.  Diarrhea has resolved since using a n enzymatic supplement called "Good Zymes" by a company called [employer] (mail order)    History Julia Cooke  has a past medical history of Depression; Psychotic disorder; Hypertension; Heart murmur; Nephrolithiasis (2000); and Bipolar disorder ([place]).   She has past surgical history that includes Lithotripsy (2000).   Her family history includes Cancer in her sister; Fibromyalgia in her sister; Heart Problems in her sister; Heart attack in her brother; Heart disease in her brother; Hyperlipidemia in her father and mother; Hypertension in her brother, father, mother, sister, and sister; Stroke in her mother.She reports that she has never smoked. She has never used smokeless tobacco. She reports that she drinks alcohol. She reports that she does not use illicit drugs.  Outpatient Prescriptions Prior to Visit  Medication Sig Dispense Refill  . ARIPiprazole (ABILIFY) 5 MG tablet Take 1 tablet (5 mg total) by mouth daily. 30 tablet 3  . clonazePAM (KLONOPIN) 0.5 MG tablet Take 1 tablet (0.5 mg total) by mouth 2 (two) times daily as needed for anxiety. 30 tablet 1  . metoprolol succinate (TOPROL-XL) 25 MG 24 hr tablet Take 1 tablet (25 mg total) by mouth daily. 90 tablet 1  . Multiple Vitamin (MULTIVITAMIN) tablet Take 1 tablet by mouth daily.     No facility-administered medications prior to visit.    Review of Systems   Patient denies headache, fevers, malaise, unintentional weight loss, skin rash, eye pain, sinus congestion and sinus pain, sore throat, dysphagia,  hemoptysis , cough, dyspnea, wheezing, chest pain, palpitations, orthopnea, edema, abdominal pain, nausea, melena, diarrhea, constipation, flank pain, dysuria, hematuria, urinary  Frequency, nocturia, numbness, tingling, seizures,  Focal weakness, Loss of consciousness,  Tremor, insomnia, depression, anxiety, and suicidal ideation.      Objective:  BP 118/76 mmHg  Pulse 85  Temp(Src) 98.1 F (36.7 C) (Oral)  Resp 12  Ht 5' (1.524 m)  Wt 185 lb 8 oz (84.142 kg)  BMI 36.23 kg/m2  SpO2 98%  Physical Exam   General appearance: alert,  cooperative and appears stated age Head: Normocephalic, without obvious abnormality, atraumatic Eyes: conjunctivae/corneas clear. PERRL, EOM's intact. Fundi benign. Ears: normal TM's and external ear canals both ears Nose: Nares normal. Septum midline. Mucosa normal. No drainage or sinus tenderness. Throat: lips, mucosa, and tongue normal; teeth and gums normal Neck: no adenopathy, no carotid bruit, no JVD, supple, symmetrical, trachea midline and thyroid not enlarged, symmetric, no tenderness/mass/nodules Lungs: clear to auscultation bilaterally Breasts: normal appearance, no masses or tenderness Heart: regular rate and rhythm, S1, S2 normal, no murmur, click, rub or gallop Abdomen: soft, non-tender; bowel sounds normal; no masses,  no organomegaly Extremities: extremities normal, atraumatic, no cyanosis or edema Pulses: 2+ and symmetric Skin: Skin color, texture, turgor normal. No rashes or lesions Neurologic: Alert and oriented X 3, normal strength and tone. Normal symmetric reflexes. Normal coordination and gait.     Assessment & Plan:   Problem List Items Addressed This Visit    HTN (hypertension) - Primary    Well controlled on current regimen. Renal function stable, no changes today.  Lab Results  Component Value Date   CREATININE 0.73 12/25/2015   Lab Results  Component Value Date   NA 140 12/25/2015   K 5.0 12/25/2015   CL 104  12/25/2015   CO2 30 12/25/2015         Relevant Orders   Comprehensive metabolic panel (Completed)   Lipid panel (Completed)   Routine general medical examination at a health care facility    Annual comprehensive preventive exam was done as well as an evaluation and management of chronic conditions .  During the course of the visit the patient was educated and counseled about appropriate screening and preventive services including :  diabetes screening, lipid analysis with projected  10 year  risk for CAD  Which is 9.3  % using the Framingham  risk calculator for women, , nutrition counseling, colorectal cancer screening, and recommended immunizations.  Printed recommendations for health maintenance screenings was given.       Impaired fasting glucose    Se has no signs of prediabetes, but recommended wt loss of 10% of body weight over the next 6 months using a low glycemic index diet and regular exercise a minimum of 5 days per week.          Change in stool habits    iLoose stools have improved with daily use of a priobiotic called "Good Zymes"  .        Vitamin D deficiency    Your vitamin D is low, w  I am calling in a megadose of Vit D to take once weekly for a total of 3 months,  Followed by OTC  Vit D3 supplement 1000 units daily.        Relevant Orders   VITAMIN D 25 Hydroxy (Vit-D Deficiency, Fractures) (Completed)   Viral URI with cough    This URI is most likely viral given themild HEENT  Symptoms.  Adding cough suppressant ,  Checking CXR for pneumonia      Obesity    I have addressed  BMI and recommended wt loss of 10% of body weight over the next 6 months using a low glycemic index diet and regular exercise a minimum of 5 days per week.        Hyperlipidemia LDL goal <100    Based on current lipid profile, the risk of clinically significant CAD is 9.3% over the next 10 years, using the Framingham risk calculator. She has a strong FH of early CAD and CVD in family members who smoked. The [employer] of Cardiology recommends starting patients aged 65 or higher on moderate intensity statin therapy for LDL between 70-189 and 10 yr risk of CAD > 7.5% ;  and high intensity therapy for anyone with LDL > 190. Will recommend statin therapy for primary prevention      Screening for breast cancer   Relevant Orders   MM DIGITAL SCREENING BILATERAL   Fatigue   Relevant Orders   TSH (Completed)   CBC with Differential/Platelet (Completed)    Other Visit Diagnoses    Cough        Relevant Orders    DG Chest  2 View    Need for hepatitis C screening test        Relevant Orders    Hepatitis C antibody (Completed)    Encounter for immunization         A total of 40 minutes was spent with patient more than half of which was spent in counseling patient on the above mentioned issues , reviewing and explaining recent labs and imaging studies done, and coordination of care. . I am having Ms. Jakubowicz start on ergocalciferol. I am also having  her maintain her multivitamin, clonazePAM, metoprolol succinate, ARIPiprazole, PROBIOTIC & ACIDOPHILUS EX ST, and NON FORMULARY.  Meds ordered this encounter  Medications  . Probiotic Product (PROBIOTIC & ACIDOPHILUS EX ST) CAPS    Sig: Take 1 capsule by mouth daily.  . NON FORMULARY    Sig: Take 1 capsule by mouth daily. Digestive enzymes  . ergocalciferol (DRISDOL) 50000 units capsule    Sig: Take 1 capsule (50,000 Units total) by mouth once a week.    Dispense:  12 capsule    Refill:  0    There are no discontinued medications.  Follow-up: No Follow-up on file.   Crecencio Mc, MD

## 2015-12-25 ENCOUNTER — Other Ambulatory Visit (INDEPENDENT_AMBULATORY_CARE_PROVIDER_SITE_OTHER): Payer: PPO

## 2015-12-25 DIAGNOSIS — I10 Essential (primary) hypertension: Secondary | ICD-10-CM | POA: Diagnosis not present

## 2015-12-25 DIAGNOSIS — Z1159 Encounter for screening for other viral diseases: Secondary | ICD-10-CM

## 2015-12-25 DIAGNOSIS — R5383 Other fatigue: Secondary | ICD-10-CM

## 2015-12-25 DIAGNOSIS — E559 Vitamin D deficiency, unspecified: Secondary | ICD-10-CM

## 2015-12-25 LAB — CBC WITH DIFFERENTIAL/PLATELET
BASOS ABS: 0 10*3/uL (ref 0.0–0.1)
Basophils Relative: 0.6 % (ref 0.0–3.0)
EOS ABS: 0.1 10*3/uL (ref 0.0–0.7)
Eosinophils Relative: 2.4 % (ref 0.0–5.0)
HEMATOCRIT: 43.4 % (ref 36.0–46.0)
HEMOGLOBIN: 14.7 g/dL (ref 12.0–15.0)
LYMPHS PCT: 28.2 % (ref 12.0–46.0)
Lymphs Abs: 1.7 10*3/uL (ref 0.7–4.0)
MCHC: 33.8 g/dL (ref 30.0–36.0)
MCV: 90.1 fl (ref 78.0–100.0)
MONO ABS: 0.4 10*3/uL (ref 0.1–1.0)
Monocytes Relative: 7.3 % (ref 3.0–12.0)
NEUTROS ABS: 3.8 10*3/uL (ref 1.4–7.7)
Neutrophils Relative %: 61.5 % (ref 43.0–77.0)
PLATELETS: 235 10*3/uL (ref 150.0–400.0)
RBC: 4.82 Mil/uL (ref 3.87–5.11)
RDW: 12.1 % (ref 11.5–15.5)
WBC: 6.2 10*3/uL (ref 4.0–10.5)

## 2015-12-25 LAB — COMPREHENSIVE METABOLIC PANEL
ALBUMIN: 4.2 g/dL (ref 3.5–5.2)
ALK PHOS: 69 U/L (ref 39–117)
ALT: 24 U/L (ref 0–35)
AST: 21 U/L (ref 0–37)
BUN: 19 mg/dL (ref 6–23)
CHLORIDE: 104 meq/L (ref 96–112)
CO2: 30 mEq/L (ref 19–32)
Calcium: 9.5 mg/dL (ref 8.4–10.5)
Creatinine, Ser: 0.73 mg/dL (ref 0.40–1.20)
GFR: 85.3 mL/min (ref >60.00)
Glucose, Bld: 102 mg/dL — ABNORMAL HIGH (ref 70–99)
POTASSIUM: 5 meq/L (ref 3.5–5.1)
SODIUM: 140 meq/L (ref 135–145)
TOTAL PROTEIN: 7 g/dL (ref 6.0–8.3)
Total Bilirubin: 1 mg/dL (ref 0.2–1.2)

## 2015-12-25 LAB — LIPID PANEL
CHOL/HDL RATIO: 4
CHOLESTEROL: 212 mg/dL — AB (ref 0–200)
HDL: 50.8 mg/dL (ref >39.00)
LDL Cholesterol: 143 mg/dL — ABNORMAL HIGH (ref 0–99)
NonHDL: 161.66
TRIGLYCERIDES: 95 mg/dL (ref 0.0–149.0)
VLDL: 19 mg/dL (ref 0.0–40.0)

## 2015-12-25 LAB — TSH: TSH: 1.2 u[IU]/mL (ref 0.35–4.50)

## 2015-12-25 LAB — VITAMIN D 25 HYDROXY (VIT D DEFICIENCY, FRACTURES): VITD: 19.49 ng/mL — AB (ref 30.00–100.00)

## 2015-12-26 ENCOUNTER — Ambulatory Visit (INDEPENDENT_AMBULATORY_CARE_PROVIDER_SITE_OTHER): Payer: PPO | Admitting: Licensed Clinical Social Worker

## 2015-12-26 DIAGNOSIS — F3110 Bipolar disorder, current episode manic without psychotic features, unspecified: Secondary | ICD-10-CM

## 2015-12-26 LAB — HEPATITIS C ANTIBODY: HCV AB: NEGATIVE

## 2015-12-27 DIAGNOSIS — E669 Obesity, unspecified: Secondary | ICD-10-CM | POA: Insufficient documentation

## 2015-12-27 DIAGNOSIS — B9789 Other viral agents as the cause of diseases classified elsewhere: Secondary | ICD-10-CM

## 2015-12-27 DIAGNOSIS — J069 Acute upper respiratory infection, unspecified: Secondary | ICD-10-CM | POA: Insufficient documentation

## 2015-12-27 DIAGNOSIS — E785 Hyperlipidemia, unspecified: Secondary | ICD-10-CM | POA: Insufficient documentation

## 2015-12-27 DIAGNOSIS — E559 Vitamin D deficiency, unspecified: Secondary | ICD-10-CM | POA: Insufficient documentation

## 2015-12-27 MED ORDER — ERGOCALCIFEROL 1.25 MG (50000 UT) PO CAPS: 50000.0000 [IU] | ORAL_CAPSULE | ORAL | Status: DC

## 2015-12-27 NOTE — Addendum Note (Signed)
Addended by: Crecencio Mc on: 12/27/2015 07:52 PM   Modules accepted: Miquel Dunn

## 2015-12-27 NOTE — Assessment & Plan Note (Addendum)
This URI is most likely viral given themild HEENT  Symptoms.  Adding cough suppressant ,  Checking CXR for pneumonia

## 2015-12-27 NOTE — Assessment & Plan Note (Signed)
Annual comprehensive preventive exam was done as well as an evaluation and management of chronic conditions .  During the course of the visit the patient was educated and counseled about appropriate screening and preventive services including :  diabetes screening, lipid analysis with projected  10 year  risk for CAD  Which is 9.3  % using the Framingham risk calculator for women, , nutrition counseling, colorectal cancer screening, and recommended immunizations.  Printed recommendations for health maintenance screenings was given.

## 2015-12-27 NOTE — Assessment & Plan Note (Addendum)
Se has no signs of prediabetes, but recommended wt loss of 10% of body weight over the next 6 months using a low glycemic index diet and regular exercise a minimum of 5 days per week.

## 2015-12-27 NOTE — Assessment & Plan Note (Signed)
I have addressed  BMI and recommended wt loss of 10% of body weight over the next 6 months using a low glycemic index diet and regular exercise a minimum of 5 days per week.   

## 2015-12-27 NOTE — Assessment & Plan Note (Signed)
With persistentt cough, cheratussin prescribed,  HEENT exam is normal.

## 2015-12-27 NOTE — Assessment & Plan Note (Signed)
Well controlled on current regimen. Renal function stable, no changes today.  Lab Results  Component Value Date   CREATININE 0.73 12/25/2015   Lab Results  Component Value Date   NA 140 12/25/2015   K 5.0 12/25/2015   CL 104 12/25/2015   CO2 30 12/25/2015

## 2015-12-27 NOTE — Addendum Note (Signed)
Addended by: Crecencio Mc on: 12/27/2015 04:58 PM   Modules accepted: Miquel Dunn

## 2015-12-27 NOTE — Assessment & Plan Note (Signed)
iLoose stools have improved with daily use of a priobiotic called "Good Zymes"  .

## 2015-12-27 NOTE — Assessment & Plan Note (Signed)
Your vitamin D is low, w  I am calling in a megadose of Vit D to take once weekly for a total of 3 months,  Followed by OTC  Vit D3 supplement 1000 units daily.

## 2015-12-27 NOTE — Assessment & Plan Note (Addendum)
Based on current lipid profile, the risk of clinically significant CAD is 9.3% over the next 10 years, using the Framingham risk calculator. She has a strong FH of early CAD and CVD in family members who smoked. The [employer] recommends starting patients aged 64 or higher on moderate intensity statin therapy for LDL between 70-189 and 10 yr risk of CAD > 7.5% ;  and high intensity therapy for anyone with LDL > 190. Will recommend statin therapy for primary prevention

## 2015-12-28 ENCOUNTER — Encounter: Payer: Self-pay | Admitting: Internal Medicine

## 2015-12-28 ENCOUNTER — Other Ambulatory Visit: Payer: Self-pay | Admitting: Internal Medicine

## 2015-12-31 NOTE — Progress Notes (Signed)
   THERAPIST PROGRESS NOTE  Session Time: 41min  Participation Level: Active  Behavioral Response: CasualAlertDepressed  Type of Therapy: Individual Therapy  Treatment Goals addressed: Coping  Interventions: CBT, Motivational Interviewing, Solution Focused, Family Systems and Reframing  Summary: Julia Cooke is a 65 y.o. female who presents with continued symptoms of her diagnosis.  Explored current and past stressors and impact daily living.  Explored previous and current coping skills that affect daily living.  Discussion of having a routine and volunteering at least twice weekly to avoid isolation and low energy.   Suicidal/Homicidal: Nowithout intent/plan  Therapist Response: LCSW provided Patient with ongoing emotional support and encouragement.  Normalized her feelings.  Commended Patient on her progress and reinforced the importance of client staying focused on her own strengths and resources and resiliency. Processed various strategies for dealing with stressors.    Plan: Return again in 2weeks.  Diagnosis: Axis I: Bipolar, Depressed    Axis II: No diagnosis    Lubertha South 12/26/2015

## 2016-01-01 ENCOUNTER — Encounter: Payer: Self-pay | Admitting: Psychiatry

## 2016-01-01 ENCOUNTER — Ambulatory Visit (INDEPENDENT_AMBULATORY_CARE_PROVIDER_SITE_OTHER): Payer: PPO | Admitting: Psychiatry

## 2016-01-01 VITALS — BP 132/86 | HR 95 | Temp 98.7°F | Ht 60.0 in | Wt 184.0 lb

## 2016-01-01 DIAGNOSIS — F3110 Bipolar disorder, current episode manic without psychotic features, unspecified: Secondary | ICD-10-CM | POA: Diagnosis not present

## 2016-01-01 MED ORDER — ESCITALOPRAM OXALATE 10 MG PO TABS: ORAL_TABLET | ORAL | Status: DC

## 2016-01-01 NOTE — Progress Notes (Signed)
Cologuard ordered

## 2016-01-01 NOTE — Progress Notes (Signed)
BH MD/PA/NP OP Progress Note  01/01/2016 12:08 PM Julia Cooke  MRN:  [id number]  Subjective:  Patient returns for follow-up of her bipolar disorder. She presents with her husband today. At the last visit in December she had talked about not having any interest or drive to do things. She is able to state that when she does get involved with things there some enjoyment. For example she states that she did some volunteer work at [employer] for a few hours and her husband states she was slightly brighter while she was doing these activities area however they state the problem often comes at home where she is anxious and then overwhelmed and does not have interest or drive. She states is difficult for her to get motivated to do things.  Chief Complaint: No motivation Chief Complaint    Follow-up; Medication Refill; Depression     Visit Diagnosis:     ICD-9-CM ICD-10-CM   1. Bipolar I disorder, most recent episode (or current) manic ([place]) 296.40 F31.10     Past Medical History:  Past Medical History  Diagnosis Date  . Depression   . Psychotic disorder     mania  . Hypertension   . Heart murmur   . Nephrolithiasis 2000    s/p lithotripsy  . Bipolar disorder [facility])     Past Surgical History  Procedure Laterality Date  . Lithotripsy  2000   Family History:  Family History  Problem Relation Age of Onset  . Hyperlipidemia Mother   . Stroke Mother   . Hypertension Mother   . Hyperlipidemia Father   . Hypertension Father   . Cancer Sister     breast  . Heart disease Brother     myocardial infarction  . Heart attack Brother   . Hypertension Brother   . Fibromyalgia Sister   . Hypertension Sister   . Heart Problems Sister   . Hypertension Sister    Social History:  Social History   Social History  . Marital Status: Married    Spouse Name: N/A  . Number of Children: N/A  . Years of Education: N/A   Social History Main Topics  . Smoking status: Never Smoker   . Smokeless  tobacco: Never Used  . Alcohol Use: 0.0 - 2.4 oz/week    0 Shots of liquor, 0-2 Glasses of wine, 0-2 Cans of beer per week     Comment: 2-3 times weekly  . Drug Use: No  . Sexual Activity: Yes    Birth Control/ Protection: Post-menopausal, None   Other Topics Concern  . None   Social History Narrative   Patient lives at home with her husband of 21 years. They have 3 adult children (2 boys, 1 girl). They recently moved back to [place] in January. 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   Additional History:   Assessment:   Musculoskeletal: Strength & Muscle Tone: within normal limits Gait & Station: normal Patient leans: N/A  Psychiatric Specialty Exam: Depression        Associated symptoms include does not have insomnia and no suicidal ideas.   Review of Systems  Psychiatric/Behavioral: Positive for depression. Negative for suicidal ideas, hallucinations, memory loss and substance abuse. The patient is not nervous/anxious and does not have insomnia.   All other systems reviewed and are negative.   Blood pressure 132/86, pulse 95, temperature 98.7 F (37.1 C), temperature  source Tympanic, height 5' (1.524 m), weight 184 lb (83.462 kg), SpO2 96 %.Body mass index is 35.94 kg/(m^2).  General Appearance: Neat and Well Groomed  Eye Contact:  Good  Speech:  Normal Rate  Volume:  Normal  Mood:  Down  Affect:  Constricted  Thought Process:  Linear  Orientation:  Full (Time, Place, and Person)  Thought Content:  Negative  Suicidal Thoughts:  No  Homicidal Thoughts:  No  Memory:  Immediate;   Good Recent;   Good Remote;   Good  Judgement:  Good  Insight:  Good  Psychomotor Activity:  Negative  Concentration:  Good  Recall:  Good  Fund of Knowledge: Good  Language: Good  Akathisia:  Negative  Handed:   AIMS (if indicated):  On 07/11/2015 and was normal   Assets:  Communication Skills Desire for  Improvement  ADL's:  Intact  Cognition: WNL  Sleep:  Increased when nothing is scheduled   Is the patient at risk to self?  No. Has the patient been a risk to self in the past 6 months?  No. Has the patient been a risk to self within the distant past?  Yes.   Is the patient a risk to others?  No. Has the patient been a risk to others in the past 6 months?  No. Has the patient been a risk to others within the distant past?  No.  Current Medications: Current Outpatient Prescriptions  Medication Sig Dispense Refill  . ARIPiprazole (ABILIFY) 5 MG tablet Take 1 tablet (5 mg total) by mouth daily. 30 tablet 3  . clonazePAM (KLONOPIN) 0.5 MG tablet Take 1 tablet (0.5 mg total) by mouth 2 (two) times daily as needed for anxiety. 30 tablet 1  . metoprolol succinate (TOPROL-XL) 25 MG 24 hr tablet Take 1 tablet (25 mg total) by mouth daily. 90 tablet 1  . Multiple Vitamin (MULTIVITAMIN) tablet Take 1 tablet by mouth daily.    . NON FORMULARY Take 1 capsule by mouth daily. Digestive enzymes    . Probiotic Product (PROBIOTIC & ACIDOPHILUS EX ST) CAPS Take 1 capsule by mouth daily.    . ergocalciferol (DRISDOL) 50000 units capsule Take 1 capsule (50,000 Units total) by mouth once a week. (Patient not taking: Reported on 01/01/2016) 12 capsule 0  . escitalopram (LEXAPRO) 10 MG tablet Take one half a tablet in the moring for one week then take one whole tablet in the morning. 30 tablet 1   No current facility-administered medications for this visit.    Medical Decision Making:  Established Problem, Stable/Improving (1) and Review of Medication Regimen & Side Effects (2)  Treatment Plan Summary:Medication management and Plan   Bipolar disorder type I- We'll continue her on her Abilify 5 mg daily. At this point given that the issues of lack of motivation, interest continue we're going to start treatment with antidepressant. I spoke with patient and husband about risk and benefits. She was able to consent.  In addition I educated about signs of becoming manic. Her going to start Lexapro [place]'s in the morning for 1 week and then she will go to 10 mg in the morning.  Anxiety-continue clonazepam 0.5 mg twice daily as needed for anxiety. Patient indicates she rarely takes this medicine.  I have informed him of my departure that they will follow-up with a new psychiatrist within this clinic.  Faith Rogue 01/01/2016, 12:08 PM

## 2016-01-01 NOTE — Addendum Note (Signed)
Addended by: Nanci Pina on: 01/01/2016 09:13 AM   Modules accepted: Miquel Dunn

## 2016-01-09 ENCOUNTER — Ambulatory Visit: Payer: PPO | Admitting: Licensed Clinical Social Worker

## 2016-01-19 DIAGNOSIS — Z1211 Encounter for screening for malignant neoplasm of colon: Secondary | ICD-10-CM | POA: Diagnosis not present

## 2016-01-19 DIAGNOSIS — Z1212 Encounter for screening for malignant neoplasm of rectum: Secondary | ICD-10-CM | POA: Diagnosis not present

## 2016-01-21 ENCOUNTER — Ambulatory Visit (INDEPENDENT_AMBULATORY_CARE_PROVIDER_SITE_OTHER): Payer: PPO | Admitting: Licensed Clinical Social Worker

## 2016-01-21 DIAGNOSIS — F3110 Bipolar disorder, current episode manic without psychotic features, unspecified: Secondary | ICD-10-CM | POA: Diagnosis not present

## 2016-01-21 LAB — COLOGUARD: COLOGUARD: NEGATIVE

## 2016-01-22 NOTE — Progress Notes (Signed)
   THERAPIST PROGRESS NOTE  Session Time: 75min  Participation Level: Active  Behavioral Response: NeatAlertDepressed  Type of Therapy: Individual Therapy  Treatment Goals addressed: Coping  Interventions: CBT, Motivational Interviewing, Solution Focused, Play Therapy, Supportive and Reframing  Summary: Julia Cooke is a 65 y.o. female who presents with continued symptoms of her diagnosis.  Review of her symptoms since the last session.  Denies attending any [place] activities but reports an increase of attendance with her church.  Reports that she is overwhelmed, lacks motivation and has no drive.  Reviewed her current daily schedule and discussion of her previous schedule (when her children were young).  Explored "new normal" with her.  Discussion of coping skills to assist with reduction in symptoms.    Suicidal/Homicidal: Nowithout intent/plan  Therapist Response: LCSW provided Patient with ongoing emotional support and encouragement.  Normalized her feelings.  Commended Patient on her progress and reinforced the importance of client staying focused on her own strengths and resources and resiliency. Processed various strategies for dealing with stressors.    Plan: PRN  Diagnosis: Axis I: Bipolar    Axis II: No diagnosis    Lubertha South 01/22/2016

## 2016-01-29 ENCOUNTER — Ambulatory Visit (INDEPENDENT_AMBULATORY_CARE_PROVIDER_SITE_OTHER): Payer: PPO | Admitting: Psychiatry

## 2016-01-29 ENCOUNTER — Encounter: Payer: Self-pay | Admitting: Psychiatry

## 2016-01-29 VITALS — BP 128/82 | HR 82 | Temp 98.6°F | Ht 60.0 in | Wt 185.6 lb

## 2016-01-29 DIAGNOSIS — F3110 Bipolar disorder, current episode manic without psychotic features, unspecified: Secondary | ICD-10-CM | POA: Diagnosis not present

## 2016-01-29 MED ORDER — ESCITALOPRAM OXALATE 10 MG PO TABS: ORAL_TABLET | ORAL | Status: DC

## 2016-01-29 MED ORDER — ARIPIPRAZOLE 5 MG PO TABS: 5.0000 mg | ORAL_TABLET | Freq: Every day | ORAL | Status: DC

## 2016-01-29 NOTE — Progress Notes (Signed)
Patient ID: Julia Cooke, female   DOB: 17-Oct-1951, 65 y.o.   MRN: [id number] [facility] MD/PA/NP OP Progress Note  01/29/2016 10:31 AM Julia Cooke  MRN:  [id number]  Subjective:  Patient returns for follow-up of her bipolar disorder. She was previously seen by dr.williams. She was started on Lexapro at her last appointment and titrated to 10mg . She reports tolerating it well. States it has helped her from her slump, helped her mood, feeling more joyful. Her family has noticed her doing better. States she is still not doing things. Feels like she has a lot of time and not being effective. Has started some church volunteer activities. Patient continues to perseverate on the fact that she needs to be doing something all the time. Discussed that she needs to be able to relax and look at different activities that she may enjoy. However she reports that she is been used to working all the time and feels lazy if she does not do anything productive. She does admit that cooking for her family stresses her out. Discussed a few options of how she could limit her stress and have her family pitch in. Doing better overall. Denies any suicidal thoughts.   Chief Complaint: mood better Chief Complaint    Follow-up; Medication Refill     Visit Diagnosis:   No diagnosis found.  Past Medical History:  Past Medical History  Diagnosis Date  . Depression   . Psychotic disorder     mania  . Hypertension   . Heart murmur   . Nephrolithiasis 2000    s/p lithotripsy  . Bipolar disorder [facility])     Past Surgical History  Procedure Laterality Date  . Lithotripsy  2000   Family History:  Family History  Problem Relation Age of Onset  . Hyperlipidemia Mother   . Stroke Mother   . Hypertension Mother   . Hyperlipidemia Father   . Hypertension Father   . Cancer Sister     breast  . Heart disease Brother     myocardial infarction  . Heart attack Brother   . Hypertension Brother   . Fibromyalgia Sister   .  Hypertension Sister   . Heart Problems Sister   . Hypertension Sister    Social History:  Social History   Social History  . Marital Status: Married    Spouse Name: N/A  . Number of Children: N/A  . Years of Education: N/A   Social History Main Topics  . Smoking status: Never Smoker   . Smokeless tobacco: Never Used  . Alcohol Use: 0.0 - 2.4 oz/week    0 Shots of liquor, 0-2 Glasses of wine, 0-2 Cans of beer per week     Comment: 2-3 times weekly  . Drug Use: No  . Sexual Activity: Yes    Birth Control/ Protection: Post-menopausal, None   Other Topics Concern  . Not on file   Social History Narrative   Patient lives at home with her husband of 18 years. They have 3 adult children (2 boys, 1 girl). They recently moved back to [place] in January. 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   Additional History:   Assessment:   Musculoskeletal: Strength & Muscle Tone: within normal limits Gait & Station: normal Patient leans: N/A  Psychiatric Specialty Exam: Depression        Associated symptoms include does not have insomnia and no  suicidal ideas.   Review of Systems  Psychiatric/Behavioral: Positive for depression. Negative for suicidal ideas, hallucinations, memory loss and substance abuse. The patient is not nervous/anxious and does not have insomnia.   All other systems reviewed and are negative.   There were no vitals taken for this visit.There is no weight on file to calculate BMI.  General Appearance: Neat and Well Groomed  Eye Contact:  Good  Speech:  Normal Rate  Volume:  Normal  Mood:  Improved   Affect:  Smiling   Thought Process:  Linear  Orientation:  Full (Time, Place, and Person)  Thought Content:  Negative  Suicidal Thoughts:  No  Homicidal Thoughts:  No  Memory:  Immediate;   Good Recent;   Good Remote;   Good  Judgement:  Good  Insight:  Good  Psychomotor Activity:   Negative  Concentration:  Good  Recall:  Good  Fund of Knowledge: Good  Language: Good  Akathisia:  Negative  Handed:   AIMS (if indicated):  On 07/11/2015 and was normal   Assets:  Communication Skills Desire for Improvement  ADL's:  Intact  Cognition: WNL  Sleep:  Increased when nothing is scheduled   Is the patient at risk to self?  No. Has the patient been a risk to self in the past 6 months?  No. Has the patient been a risk to self within the distant past?  Yes.   Is the patient a risk to others?  No. Has the patient been a risk to others in the past 6 months?  No. Has the patient been a risk to others within the distant past?  No.  Current Medications: Current Outpatient Prescriptions  Medication Sig Dispense Refill  . ARIPiprazole (ABILIFY) 5 MG tablet Take 1 tablet (5 mg total) by mouth daily. 30 tablet 3  . clonazePAM (KLONOPIN) 0.5 MG tablet Take 1 tablet (0.5 mg total) by mouth 2 (two) times daily as needed for anxiety. 30 tablet 1  . ergocalciferol (DRISDOL) 50000 units capsule Take 1 capsule (50,000 Units total) by mouth once a week. (Patient not taking: Reported on 01/01/2016) 12 capsule 0  . escitalopram (LEXAPRO) 10 MG tablet Take one half a tablet in the moring for one week then take one whole tablet in the morning. 30 tablet 1  . metoprolol succinate (TOPROL-XL) 25 MG 24 hr tablet Take 1 tablet (25 mg total) by mouth daily. 90 tablet 1  . Multiple Vitamin (MULTIVITAMIN) tablet Take 1 tablet by mouth daily.    . NON FORMULARY Take 1 capsule by mouth daily. Digestive enzymes    . Probiotic Product (PROBIOTIC & ACIDOPHILUS EX ST) CAPS Take 1 capsule by mouth daily.     No current facility-administered medications for this visit.    Medical Decision Making:  Established Problem, Stable/Improving (1) and Review of Medication Regimen & Side Effects (2)  Treatment Plan Summary:Medication management and Plan   Bipolar disorder type I- We'll continue her on her Abilify  5 mg daily.  Continue on the Lexapro 10 mg daily. Patient aware that she needs to watch out for manic symptoms.  Anxiety-clonazepam 0.5mg , Patient indicates she rarely takes this medicine. She is okay with discontinuing   Return to clinic in 1 month's time or call before if necessary.   Alixandra Alfieri 01/29/2016, 10:31 AM

## 2016-02-03 ENCOUNTER — Telehealth: Payer: Self-pay | Admitting: Internal Medicine

## 2016-02-03 NOTE — Telephone Encounter (Signed)
Received cologuard result negative.

## 2016-02-04 ENCOUNTER — Encounter: Payer: Self-pay | Admitting: *Deleted

## 2016-02-04 NOTE — Telephone Encounter (Signed)
Letter mailed

## 2016-02-04 NOTE — Telephone Encounter (Signed)
The results of patient's cologuard is negative.   We will repeat annually  For colon CA screening.  

## 2016-02-20 DIAGNOSIS — H2513 Age-related nuclear cataract, bilateral: Secondary | ICD-10-CM | POA: Diagnosis not present

## 2016-03-01 ENCOUNTER — Ambulatory Visit: Payer: PPO | Admitting: Psychiatry

## 2016-03-04 ENCOUNTER — Ambulatory Visit: Payer: PPO | Admitting: Psychiatry

## 2016-03-04 ENCOUNTER — Encounter: Payer: Self-pay | Admitting: Psychiatry

## 2016-03-04 MED ORDER — ESCITALOPRAM OXALATE 10 MG PO TABS: ORAL_TABLET | ORAL | Status: DC

## 2016-03-04 NOTE — Progress Notes (Signed)
Patient ID: Arwyn Giardina, female   DOB: 05/12/1951, 65 y.o.   MRN: [id number]  [facility] MD/PA/NP OP Progress Note  03/04/2016 12:04 PM Ann Raelinn Giampa  MRN:  [id number]  Subjective:  Patient returns for follow-up of her bipolar disorder.. States she is still not doing things. Feels like she has a lot of time and not being effective. Has started some church volunteer activities. Patient continues to perseverate on the fact that she needs to be doing something all the time. Discussed that she needs to be able to relax and look at different activities that she may enjoy. However she reports that she is been used to working all the time and feels lazy if she does not do anything productive. She does admit that cooking for her family stresses her out. Discussed a few options of how she could limit her stress and have her family pitch in. Doing better overall. Denies any suicidal thoughts.   Chief Complaint: mood better  Visit Diagnosis:   No diagnosis found.  Past Medical History:  Past Medical History  Diagnosis Date  . Depression   . Psychotic disorder     mania  . Hypertension   . Heart murmur   . Nephrolithiasis 2000    s/p lithotripsy  . Bipolar disorder [facility])     Past Surgical History  Procedure Laterality Date  . Lithotripsy  2000   Family History:  Family History  Problem Relation Age of Onset  . Hyperlipidemia Mother   . Stroke Mother   . Hypertension Mother   . Hyperlipidemia Father   . Hypertension Father   . Cancer Sister     breast  . Heart disease Brother     myocardial infarction  . Heart attack Brother   . Hypertension Brother   . Fibromyalgia Sister   . Hypertension Sister   . Heart Problems Sister   . Hypertension Sister    Social History:  Social History   Social History  . Marital Status: Married    Spouse Name: N/A  . Number of Children: N/A  . Years of Education: N/A   Social History Main Topics  . Smoking status: Never Smoker   . Smokeless  tobacco: Never Used  . Alcohol Use: 0.0 - 2.4 oz/week    0 Shots of liquor, 0-2 Glasses of wine, 0-2 Cans of beer per week     Comment: 2-3 times weekly  . Drug Use: No  . Sexual Activity: Yes    Birth Control/ Protection: Post-menopausal, None   Other Topics Concern  . Not on file   Social History Narrative   Patient lives at home with her husband of 91 years. They have 3 adult children (2 boys, 1 girl). They recently moved back to [place] in January. 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   Additional History:   Assessment:   Musculoskeletal: Strength & Muscle Tone: within normal limits Gait & Station: normal Patient leans: N/A  Psychiatric Specialty Exam: Depression        Associated symptoms include does not have insomnia and no suicidal ideas.   Review of Systems  Psychiatric/Behavioral: Positive for depression. Negative for suicidal ideas, hallucinations, memory loss and substance abuse. The patient is not nervous/anxious and does not have insomnia.   All other systems reviewed and are negative.   There were no vitals taken for this visit.There is no weight on  file to calculate BMI.  General Appearance: Neat and Well Groomed  Eye Contact:  Good  Speech:  Normal Rate  Volume:  Normal  Mood:  Improved   Affect:  Smiling   Thought Process:  Linear  Orientation:  Full (Time, Place, and Person)  Thought Content:  Negative  Suicidal Thoughts:  No  Homicidal Thoughts:  No  Memory:  Immediate;   Good Recent;   Good Remote;   Good  Judgement:  Good  Insight:  Good  Psychomotor Activity:  Negative  Concentration:  Good  Recall:  Good  Fund of Knowledge: Good  Language: Good  Akathisia:  Negative  Handed:   AIMS (if indicated):    Assets:  Communication Skills Desire for Improvement  ADL's:  Intact  Cognition: WNL  Sleep:  Increased when nothing is scheduled   Is the patient at risk to  self?  No. Has the patient been a risk to self in the past 6 months?  No. Has the patient been a risk to self within the distant past?  Yes.   Is the patient a risk to others?  No. Has the patient been a risk to others in the past 6 months?  No. Has the patient been a risk to others within the distant past?  No.  Current Medications: Current Outpatient Prescriptions  Medication Sig Dispense Refill  . ARIPiprazole (ABILIFY) 5 MG tablet Take 1 tablet (5 mg total) by mouth daily. 30 tablet 3  . ergocalciferol (DRISDOL) 50000 units capsule Take 1 capsule (50,000 Units total) by mouth once a week. 12 capsule 0  . escitalopram (LEXAPRO) 10 MG tablet Take 1 tablet by mouth daily 30 tablet 1  . metoprolol succinate (TOPROL-XL) 25 MG 24 hr tablet Take 1 tablet (25 mg total) by mouth daily. 90 tablet 1  . Multiple Vitamin (MULTIVITAMIN) tablet Take 1 tablet by mouth daily.    . NON FORMULARY Take 1 capsule by mouth daily. Digestive enzymes    . Probiotic Product (PROBIOTIC & ACIDOPHILUS EX ST) CAPS Take 1 capsule by mouth daily.     No current facility-administered medications for this visit.    Medical Decision Making:  Established Problem, Stable/Improving (1) and Review of Medication Regimen & Side Effects (2)  Treatment Plan Summary:Medication management and Plan   Bipolar disorder type I- We'll continue her on her Abilify 5 mg daily.  Continue on the Lexapro 10 mg daily. Patient aware that she needs to watch out for manic symptoms.  Anxiety-clonazepam 0.5mg , Patient indicates she rarely takes this medicine. She is okay with discontinuing   Return to clinic in 1 month's time or call before if necessary.   Lua Feng 03/04/2016, 12:04 PM

## 2016-04-08 ENCOUNTER — Other Ambulatory Visit: Payer: Self-pay | Admitting: Nurse Practitioner

## 2016-05-04 ENCOUNTER — Ambulatory Visit: Payer: Self-pay | Admitting: Psychiatry

## 2016-05-13 ENCOUNTER — Ambulatory Visit: Payer: PPO | Admitting: Licensed Clinical Social Worker

## 2016-05-26 ENCOUNTER — Ambulatory Visit: Payer: PPO | Admitting: Psychiatry

## 2016-06-04 ENCOUNTER — Ambulatory Visit (INDEPENDENT_AMBULATORY_CARE_PROVIDER_SITE_OTHER): Payer: PPO | Admitting: Psychiatry

## 2016-06-04 ENCOUNTER — Encounter: Payer: Self-pay | Admitting: Psychiatry

## 2016-06-04 VITALS — BP 122/78 | HR 73 | Temp 98.1°F | Wt 191.8 lb

## 2016-06-04 DIAGNOSIS — F3110 Bipolar disorder, current episode manic without psychotic features, unspecified: Secondary | ICD-10-CM

## 2016-06-04 MED ORDER — ARIPIPRAZOLE 2 MG PO TABS: ORAL_TABLET | ORAL | Status: DC

## 2016-06-04 MED ORDER — ESCITALOPRAM OXALATE 10 MG PO TABS: ORAL_TABLET | ORAL | Status: DC

## 2016-06-04 NOTE — Progress Notes (Signed)
Patient ID: Julia Cooke, female   DOB: 1951/09/06, 65 y.o.   MRN: [id number]   [facility] MD/PA/NP OP Progress Note  06/04/2016 9:34 AM [name] Tatyanah Frane  MRN:  [id number]  Subjective:  Patient returns for follow-up of her bipolar disorder.. Patient seen today with her husband. She reports that she has not been feeling well. States that she does not feel like doing anything. She per husband she is not the person that he knew. States that she is not engaged with anything. States that the children are visiting and she does not seem to take any joy in that. Patient reports sleeping okay and eating okay. States that she is not motivated to do anything around the house at all. Denies any suicidal thoughts. Denies any manic symptoms. States that she has not been seeing a therapist for a while.  Chief Complaint: Depressed Chief Complaint    Follow-up; Medication Refill; Medication Problem; Weight Gain     Visit Diagnosis:     ICD-9-CM ICD-10-CM   1. Bipolar I disorder, most recent episode (or current) manic ([place]) 296.40 F31.10     Past Medical History:  Past Medical History  Diagnosis Date  . Depression   . Psychotic disorder     mania  . Hypertension   . Heart murmur   . Nephrolithiasis 2000    s/p lithotripsy  . Bipolar disorder [facility])     Past Surgical History  Procedure Laterality Date  . Lithotripsy  2000   Family History:  Family History  Problem Relation Age of Onset  . Hyperlipidemia Mother   . Stroke Mother   . Hypertension Mother   . Hyperlipidemia Father   . Hypertension Father   . Cancer Sister     breast  . Heart disease Brother     myocardial infarction  . Heart attack Brother   . Hypertension Brother   . Fibromyalgia Sister   . Hypertension Sister   . Heart Problems Sister   . Hypertension Sister    Social History:  Social History   Social History  . Marital Status: Married    Spouse Name: N/A  . Number of Children: N/A  . Years of Education: N/A   Social  History Main Topics  . Smoking status: Never Smoker   . Smokeless tobacco: Never Used  . Alcohol Use: 0.0 - 2.4 oz/week    0 Shots of liquor, 0-2 Glasses of wine, 0-2 Cans of beer per week     Comment: 2-3 times weekly  . Drug Use: No  . Sexual Activity: Yes    Birth Control/ Protection: Post-menopausal, None   Other Topics Concern  . None   Social History Narrative   Patient lives at home with her husband of 47 years. They have 3 adult children (2 boys, 1 girl). They recently moved back to [place] in January. 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   Additional History:   Assessment:   Musculoskeletal: Strength & Muscle Tone: within normal limits Gait & Station: normal Patient leans: N/A  Psychiatric Specialty Exam: Depression        Associated symptoms include does not have insomnia and no suicidal ideas.   Review of Systems  Psychiatric/Behavioral: Positive for depression. Negative for suicidal ideas, hallucinations, memory loss and substance abuse. The patient is not nervous/anxious and does not have insomnia.   All other systems reviewed and are negative.  Blood pressure 122/78, pulse 73, temperature 98.1 F (36.7 C), temperature source Tympanic, weight 191 lb 12.8 oz (87 kg), SpO2 97 %.Body mass index is 37.46 kg/(m^2).  General Appearance: Neat and Well Groomed  Eye Contact:  Good  Speech:  Normal Rate  Volume:  Normal  Mood:  Depressed   Affect: Anxious   Thought Process:  Linear  Orientation:  Full (Time, Place, and Person)  Thought Content:  Negative  Suicidal Thoughts:  No  Homicidal Thoughts:  No  Memory:  Immediate;   Good Recent;   Good Remote;   Good  Judgement:  Good  Insight:  Good  Psychomotor Activity:  Negative  Concentration:  Good  Recall:  Good  Fund of Knowledge: Good  Language: Good  Akathisia:  Negative  Handed:   AIMS (if indicated):    Assets:  Communication  Skills Desire for Improvement  ADL's:  Intact  Cognition: WNL  Sleep:  Increased when nothing is scheduled   Is the patient at risk to self?  No. Has the patient been a risk to self in the past 6 months?  No. Has the patient been a risk to self within the distant past?  Yes.   Is the patient a risk to others?  No. Has the patient been a risk to others in the past 6 months?  No. Has the patient been a risk to others within the distant past?  No.  Current Medications: Current Outpatient Prescriptions  Medication Sig Dispense Refill  . ARIPiprazole (ABILIFY) 5 MG tablet Take 1 tablet (5 mg total) by mouth daily. 30 tablet 3  . ergocalciferol (DRISDOL) 50000 units capsule Take 1 capsule (50,000 Units total) by mouth once a week. 12 capsule 0  . escitalopram (LEXAPRO) 10 MG tablet Take 1 tablet by mouth daily 30 tablet 1  . metoprolol succinate (TOPROL-XL) 25 MG 24 hr tablet TAKE 1 TABLET (25 MG TOTAL) BY MOUTH DAILY. 90 tablet 1  . Multiple Vitamin (MULTIVITAMIN) tablet Take 1 tablet by mouth daily.    . NON FORMULARY Take 1 capsule by mouth daily. Digestive enzymes     No current facility-administered medications for this visit.    Medical Decision Making:  Established Problem, Stable/Improving (1) and Review of Medication Regimen & Side Effects (2)  Treatment Plan Summary:Medication management and Plan   Bipolar disorder type I- Decrease Abilify to 2 mg once daily for 2 weeks and then discontinue. Continue on the Lexapro 10 mg daily. Patient aware that she needs to watch out for manic symptoms. Discussed that if patient's symptoms become severe she will need hospitalization both patient and husband agree with this plan.  Start therapy again with [facility], address issues related to her guilt and transition of life to a different phase.  Anxiety-clonazepam 0.5mg , Patient indicates she rarely takes this medicine. She is okay with discontinuing   Return to clinic in 1 month's  time or call before if necessary.   Sargun Rummell 06/04/2016, 9:34 AM

## 2016-06-10 ENCOUNTER — Ambulatory Visit (INDEPENDENT_AMBULATORY_CARE_PROVIDER_SITE_OTHER): Payer: PPO | Admitting: Licensed Clinical Social Worker

## 2016-06-10 DIAGNOSIS — F3110 Bipolar disorder, current episode manic without psychotic features, unspecified: Secondary | ICD-10-CM

## 2016-06-15 ENCOUNTER — Emergency Department
Admission: EM | Admit: 2016-06-15 | Discharge: 2016-06-15 | Disposition: A | Discharge status: Home / Self Care | Payer: PPO | Attending: Emergency Medicine | Admitting: Emergency Medicine

## 2016-06-15 ENCOUNTER — Emergency Department: Payer: PPO

## 2016-06-15 DIAGNOSIS — I10 Essential (primary) hypertension: Secondary | ICD-10-CM | POA: Diagnosis not present

## 2016-06-15 DIAGNOSIS — E785 Hyperlipidemia, unspecified: Secondary | ICD-10-CM | POA: Insufficient documentation

## 2016-06-15 DIAGNOSIS — F319 Bipolar disorder, unspecified: Secondary | ICD-10-CM | POA: Diagnosis not present

## 2016-06-15 DIAGNOSIS — Z79899 Other long term (current) drug therapy: Secondary | ICD-10-CM | POA: Insufficient documentation

## 2016-06-15 DIAGNOSIS — M179 Osteoarthritis of knee, unspecified: Secondary | ICD-10-CM | POA: Diagnosis not present

## 2016-06-15 DIAGNOSIS — M1711 Unilateral primary osteoarthritis, right knee: Secondary | ICD-10-CM | POA: Diagnosis not present

## 2016-06-15 DIAGNOSIS — M25561 Pain in right knee: Secondary | ICD-10-CM

## 2016-06-15 MED ORDER — OXYCODONE-ACETAMINOPHEN 5-325 MG PO TABS: 1.0000 | ORAL_TABLET | Freq: Three times a day (TID) | ORAL | Status: DC | PRN

## 2016-06-15 MED ORDER — MELOXICAM 15 MG PO TABS: 15.0000 mg | ORAL_TABLET | Freq: Every day | ORAL | Status: DC | PRN

## 2016-06-15 NOTE — ED Notes (Signed)
Right knee pain

## 2016-06-15 NOTE — ED Provider Notes (Signed)
[facility]  Emergency Department Provider Note   Time seen: Approximately 1130am  I have reviewed the triage vital signs and the nursing notes.   HISTORY  Chief Complaint Knee Pain  HPI Julia Cooke is a 65 y.o. female presents with family at bedside for the complaints of right knee pain. Patient reports she has a history of chronic right knee pain for years. Patient states normally her right knee pain is intermittent and mostly with actively trying to stand up from a sitting position. Patient states however this past Friday she went bowling with her family. Patient states that she bowled four games and had a lot of twisting movements in her right knee. Patient reports mild pain during this activity but was able to continue playing. States gradually over the weekend the pain in her right knee increased. Patient states this morning she is having pain when trying to weight-bear. Patient states the pain is directly in the knee joint of her knee and located at the front. Denies any pain radiation. Denies any numbness or tingling sensation. Patient states if she is lying completely still she does not have any pain. Patient states that pain is mostly with bending and straightening her right knee. States no pain at this time at rest, but moderate pain with walking.   Denies fall or direct trauma. Denies leg swelling or redness. Denies fevers. Denies chest pain, shortness of breath, abdominal pain, recent sedentary lifestyle, recent surgeries or other complaints.   PCP [place] No LMP recorded. Patient is postmenopausal.    Past Medical History  Diagnosis Date  . Depression   . Psychotic disorder     mania  . Hypertension   . Heart murmur   . Nephrolithiasis 2000    s/p lithotripsy  . Bipolar disorder [facility])     Patient Active Problem List   Diagnosis Date Noted  . Vitamin D deficiency 12/27/2015  . Viral URI with cough 12/27/2015  . Obesity 12/27/2015  .  Hyperlipidemia LDL goal <100 12/27/2015  . Change in stool habits 11/19/2014  . Bipolar disorder ([place]) 11/18/2014  . Routine general medical examination at a health care facility 04/08/2014  . Pap smear for cervical cancer screening 04/08/2014  . Screen for colon cancer 04/08/2014  . Screening for breast cancer 04/08/2014  . Impaired fasting glucose 04/08/2014  . Need for shingles vaccine 04/08/2014  . Dyspareunia 01/28/2014  . Vaginal dryness, menopausal 01/28/2014  . Fatigue 01/07/2014  . Decreased libido 01/07/2014  . Medication management 01/07/2014  . Right knee pain 05/02/2013  . HTN (hypertension) 04/04/2013  . Depression 04/04/2013  . Mania ([place]) 04/04/2013    Past Surgical History  Procedure Laterality Date  . Lithotripsy  2000    Current Outpatient Rx  Name  Route  Sig  Dispense  Refill  . ARIPiprazole (ABILIFY) 2 MG tablet      Take 1 tablet daily for 2 weeks and then discontinue.   30 tablet   0   . ergocalciferol (DRISDOL) 50000 units capsule   Oral   Take 1 capsule (50,000 Units total) by mouth once a week.   12 capsule   0   . escitalopram (LEXAPRO) 10 MG tablet      Take 1 tablet by mouth daily   30 tablet   1   . metoprolol succinate (TOPROL-XL) 25 MG 24 hr tablet      TAKE 1 TABLET (25 MG TOTAL) BY MOUTH DAILY.   90 tablet  1     No refills available   . Multiple Vitamin (MULTIVITAMIN) tablet   Oral   Take 1 tablet by mouth daily.         . NON FORMULARY   Oral   Take 1 capsule by mouth daily. Digestive enzymes           Allergies Penicillins  Family History  Problem Relation Age of Onset  . Hyperlipidemia Mother   . Stroke Mother   . Hypertension Mother   . Hyperlipidemia Father   . Hypertension Father   . Cancer Sister     breast  . Heart disease Brother     myocardial infarction  . Heart attack Brother   . Hypertension Brother   . Fibromyalgia Sister   . Hypertension Sister   . Heart Problems Sister   .  Hypertension Sister     Social History Social History  Substance Use Topics  . Smoking status: Never Smoker   . Smokeless tobacco: Never Used  . Alcohol Use: 0.0 - 2.4 oz/week    0 Shots of liquor, 0-2 Glasses of wine, 0-2 Cans of beer per week     Comment: 2-3 times weekly    Review of Systems Constitutional: No fever/chills Eyes: No visual changes. ENT: No sore throat. Cardiovascular: Denies chest pain. Respiratory: Denies shortness of breath. Gastrointestinal: No abdominal pain.  No nausea, no vomiting.  No diarrhea.  No constipation. Genitourinary: Negative for dysuria. Musculoskeletal: Negative for back pain.As above. Skin: Negative for rash. Neurological: Negative for headaches, focal weakness or numbness.  10-point ROS otherwise negative.  ____________________________________________   PHYSICAL EXAM:  VITAL SIGNS: ED Triage Vitals  Enc Vitals Group     BP 06/15/16 1053 156/86 mmHg     Pulse Rate 06/15/16 1053 70     Resp 06/15/16 1053 18     Temp 06/15/16 1053 98.2 F (36.8 C)     Temp Source 06/15/16 1053 Oral     SpO2 06/15/16 1053 99 %     Weight 06/15/16 1053 195 lb (88.451 kg)     Height 06/15/16 1053 5\' 1"  (1.549 m)     Head Cir --      Peak Flow --      Pain Score 06/15/16 1057 8     Pain Loc --      Pain Edu? --      Excl. in [place]? --     Constitutional: Alert and oriented. Well appearing and in no acute distress. Eyes: Conjunctivae are normal. PERRL. EOMI. Head: Atraumatic.  Ears: Normal external appearance bilaterally.  Nose: No congestion/rhinnorhea.  Mouth/Throat: Mucous membranes are moist.   Neck: No stridor.  No cervical spine tenderness to palpation. Cardiovascular: Normal rate, regular rhythm. Grossly normal heart sounds.  Good peripheral circulation. Respiratory: Normal respiratory effort.  No retractions. Lungs CTAB. No wheezes, rales or rhonchi. Gastrointestinal: Soft and nontender. No CVA tenderness. Musculoskeletal: No lower or  upper extremity tenderness nor edema.  Bilateral pedal pulses equal and easily palpated.  Except: Mild anterior mid knee tenderness to palpation and mild tenderness surrounding lower patella, mild pain with anterior drawer test, no pain with posterior drawer test, minimal pain with medial and lateral stress, mild pain with resisted right knee flexion and extension, full range of motion to right knee present, no erythema, no swelling, skin intact, no exudate or drainage. No calf tenderness bilaterally. Negative Homans sign bilaterally. Bilateral pedal pulses equal and easily palpated. Gait not tested due to  pain. Neurologic:  Normal speech and language. No gross focal neurologic deficits are appreciated. Skin:  Skin is warm, dry and intact. No rash noted. Psychiatric: Mood and affect are normal. Speech and behavior are normal.  ____________________________________________   LABS (all labs ordered are listed, but only abnormal results are displayed)  Labs Reviewed - No data to display  RADIOLOGY  Dg Knee Complete 4 Views Right  06/15/2016  CLINICAL DATA:  Right knee pain and swelling since Friday. Increasing pain today. No history of injury. EXAM: RIGHT KNEE - COMPLETE 4+ VIEW COMPARISON:  08/22/2014. FINDINGS: No fracture. No subluxation or dislocation. No substantial joint effusion. Hypertrophic spurring is visible in all 3 compartments. Mineralization of the lateral meniscus is evident. 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 Electronically Signed   By: Misty Stanley M.D.   On: 06/15/2016 12:21   I, Marylene Land, personally viewed and evaluated these images (plain radiographs) as part of my medical decision making, as well as reviewing the written report by the radiologist.  ____________________________________________   PROCEDURES  Procedure(s) performed:  Gilford Rile and right knee immobilizer applied by RN. Neurovascular intact post  application.  INITIAL IMPRESSION / ASSESSMENT AND PLAN / ED COURSE  Pertinent labs & imaging results that were available during my care of the patient were reviewed by me and considered in my medical decision making (see chart for details).  Well-appearing patient. Presents for complaint of right knee pain. Patient reports history of chronic right knee pain times years but reports pain increased after twisting when bowling this past weekend. Denies fall or direct trauma. Mild anterior knee tenderness to palpation. No calf tenderness bilaterally. Discussed with patient suspect osteoarthritis and strain injury. Will evaluate right knee x-ray.  Right knee x-ray reviewed. Per radiologist's tricompartmental degenerative changes with evidence of chondrocalcinosis. Discussed x-ray findings with patient and family. Recommend rest and conservative measures. Will treat with walker for support and stability and patient requests right knee immobilizer. Encouraged stretching, ice, elevation. Encouraged gradual weight applying as tolerated. Encouraged patient to follow-up with PCP and orthopedic. Information for orthopedic given for patient to call and schedule appointment in 1 week as needed for continued pain. Will treat when necessary pain with Mobic and when necessary Percocet for breakthrough pain. Discussed indication, risks and benefits of medications with patient.  Discussed follow up with Primary care physician this week. Discussed follow up and return parameters including no resolution or any worsening concerns. Patient verbalized understanding and agreed to plan.   ____________________________________________   FINAL CLINICAL IMPRESSION(S) / ED DIAGNOSES  Final diagnoses:  Right knee pain  Osteoarthritis of right knee, unspecified osteoarthritis type     Discharge Medication List as of 06/15/2016 12:40 PM    START taking these medications   Details  meloxicam (MOBIC) 15 MG tablet Take 1  tablet (15 mg total) by mouth daily as needed for pain., Starting 06/15/2016, Until Discontinued, Print    oxyCODONE-acetaminophen (ROXICET) 5-325 MG tablet Take 1 tablet by mouth every 8 (eight) hours as needed for moderate pain or severe pain (Do not drive or operate heavy machinery while taking as can cause drowsiness.)., Starting 06/15/2016, Until Discontinued, Print        Note: This dictation was prepared with Dragon dictation along with smaller phrase technology. Any transcriptional errors that result from this process are unintentional.       Marylene Land, NP 06/15/16 1332  Eula Listen, MD 06/15/16 1526

## 2016-06-15 NOTE — Discharge Instructions (Signed)
Take medication as prescribed. Rest. Apply ice and elevate. Stretch daily. Gradually apply weight as tolerated as discussed.   Follow up with orthopedic as above as needed for continued pain.   Follow up with your primary care physician this week as needed. Return to Urgent care for new or worsening concerns.    Knee Pain Knee pain is a very common symptom and can have many causes. Knee pain often goes away when you follow your health care provider's instructions for relieving pain and discomfort at home. However, knee pain can develop into a condition that needs treatment. Some conditions may include:  Arthritis caused by wear and tear (osteoarthritis).  Arthritis caused by swelling and irritation (rheumatoid arthritis or gout).  A cyst or growth in your knee.  An infection in your knee joint.  An injury that will not heal.  Damage, swelling, or irritation of the tissues that support your knee (torn ligaments or tendinitis). If your knee pain continues, additional tests may be ordered to diagnose your condition. Tests may include X-rays or other imaging studies of your knee. You may also need to have fluid removed from your knee. Treatment for ongoing knee pain depends on the cause, but treatment may include:  Medicines to relieve pain or swelling.  Steroid injections in your knee.  Physical therapy.  Surgery. HOME CARE INSTRUCTIONS  Take medicines only as directed by your health care provider.  Rest your knee and keep it raised (elevated) while you are resting.  Do not do things that cause or worsen pain.  Avoid high-impact activities or exercises, such as running, jumping rope, or doing jumping jacks.  Apply ice to the knee area:  Put ice in a plastic bag.  Place a towel between your skin and the bag.  Leave the ice on for 20 minutes, 2-3 times a day.  Ask your health care provider if you should wear an elastic knee support.  Keep a pillow under your knee when you  sleep.  Lose weight if you are overweight. Extra weight can put pressure on your knee.  Do not use any tobacco products, including cigarettes, chewing tobacco, or electronic cigarettes. If you need help quitting, ask your health care provider. Smoking may slow the healing of any bone and joint problems that you may have. SEEK MEDICAL CARE IF:  Your knee pain continues, changes, or gets worse.  You have a fever along with knee pain.  Your knee buckles or locks up.  Your knee becomes more swollen. SEEK IMMEDIATE MEDICAL CARE IF:   Your knee joint feels hot to the touch.  You have chest pain or trouble breathing.   This information is not intended to replace advice given to you by your health care provider. Make sure you discuss any questions you have with your health care provider.   Document Released: 09/12/2007 Document Revised: 12/06/2014 Document Reviewed: 07/01/2014 Elsevier Interactive Patient Education 2016 [place] is a term that is commonly used to refer to joint pain or joint disease. There are more than 100 types of arthritis. CAUSES The most common cause of this condition is wear and tear of a joint. Other causes include:  Gout.  Inflammation of a joint.  An infection of a joint.  Sprains and other injuries near the joint.  A drug reaction or allergic reaction. In some cases, the cause may not be known. SYMPTOMS The main symptom of this condition is pain in the joint with movement. Other symptoms  include:  Redness, swelling, or stiffness at a joint.  Warmth coming from the joint.  Fever.  Overall feeling of illness. DIAGNOSIS This condition may be diagnosed with a physical exam and tests, including:  Blood tests.  Urine tests.  Imaging tests, such as MRI, X-rays, or a CT scan. Sometimes, fluid is removed from a joint for testing. TREATMENT Treatment for this condition may involve:  Treatment of the cause, if it is  known.  Rest.  Raising (elevating) the joint.  Applying cold or hot packs to the joint.  Medicines to improve symptoms and reduce inflammation.  Injections of a steroid such as cortisone into the joint to help reduce pain and inflammation. Depending on the cause of your arthritis, you may need to make lifestyle changes to reduce stress on your joint. These changes may include exercising more and losing weight. HOME CARE INSTRUCTIONS Medicines  Take over-the-counter and prescription medicines only as told by your health care provider.  Do not take aspirin to relieve pain if gout is suspected. Activities  Rest your joint if told by your health care provider. Rest is important when your disease is active and your joint feels painful, swollen, or stiff.  Avoid activities that make the pain worse. It is important to balance activity with rest.  Exercise your joint regularly with range-of-motion exercises as told by your health care provider. Try doing low-impact exercise, such as:  Swimming.  Water aerobics.  Biking.  Walking. Joint Care  If your joint is swollen, keep it elevated if told by your health care provider.  If your joint feels stiff in the morning, try taking a warm shower.  If directed, apply heat to the joint. If you have diabetes, do not apply heat without permission from your health care provider.  Put a towel between the joint and the hot pack or heating pad.  Leave the heat on the area for 20-30 minutes.  If directed, apply ice to the joint:  Put ice in a plastic bag.  Place a towel between your skin and the bag.  Leave the ice on for 20 minutes, 2-3 times per day.  Keep all follow-up visits as told by your health care provider. This is important. SEEK MEDICAL CARE IF:  The pain gets worse.  You have a fever. SEEK IMMEDIATE MEDICAL CARE IF:  You develop severe joint pain, swelling, or redness.  Many joints become painful and swollen.  You  develop severe back pain.  You develop severe weakness in your leg.  You cannot control your bladder or bowels.   This information is not intended to replace advice given to you by your health care provider. Make sure you discuss any questions you have with your health care provider.   Document Released: 12/23/2004 Document Revised: 08/06/2015 Document Reviewed: 02/10/2015 Elsevier Interactive Patient Education [employer].

## 2016-06-22 ENCOUNTER — Ambulatory Visit (INDEPENDENT_AMBULATORY_CARE_PROVIDER_SITE_OTHER): Payer: PPO | Admitting: Licensed Clinical Social Worker

## 2016-06-22 DIAGNOSIS — F3110 Bipolar disorder, current episode manic without psychotic features, unspecified: Secondary | ICD-10-CM

## 2016-06-22 NOTE — Progress Notes (Signed)
   THERAPIST PROGRESS NOTE  Session Time: 8min  Participation Level: Active  Behavioral Response: CasualAlertAnxious  Type of Therapy: Individual Therapy  Treatment Goals addressed: Coping  Interventions: CBT, Motivational Interviewing, Solution Focused, Supportive, Family Systems and Reframing  Summary: Julia Cooke is a 65 y.o. female who presents with the new symptoms of her diagnosis.   Allow her time to vent since our previous session. Allow her to rate her symptoms. On a scale of 1-10. She chose a 7.  She denies having severe symptoms. Discussion of her symptoms and the intensity of them.  Discussion of her triggers.  She reports being busy since the previous session with cooking, cleaning and caring for her adult children.  She denies being worried about finances.  She reports being a little all over the place at times and not being able to focus on small things such as washing dishes.  Factors that contribute to client's ongoing depressive symptoms were discussed and include real and perceived feelings of isolation, criticism, rejection, shame and guilt. Client discussed examples of situations where there are difficulties with boundaries and boundary setting and self-assertion with her adult children.  Suicidal/Homicidal: Nowithout intent/plan  Therapist Response: LCSW provided Patient with ongoing emotional support and encouragement.  Normalized her feelings.  Commended Patient on her progress and reinforced the importance of client staying focused on her own strengths and resources and resiliency. Processed various strategies for dealing with stressors.    Plan: Return again in2 weeks.  Diagnosis: Axis I: Bipolar, mixed    Axis II: No diagnosis    Lubertha South, LCSW 06/10/2016

## 2016-06-24 NOTE — Progress Notes (Signed)
   THERAPIST PROGRESS NOTE  Session Time: 79  Participation Level: Active  Behavioral Response: CasualAlertAnxious  Type of Therapy: Individual Therapy  Treatment Goals addressed: Coping  Interventions: CBT, Motivational Interviewing, Solution Focused, Family Systems and Reframing  Summary: Julia Cooke is a 65 y.o. female who presents with symptoms of her diagnosis.  Therapist engaged Patient in discussion about her life and what is going well for her.  Therapist provided support for Patient as she shared details about her life, her current stressors, mood, coping skills, her past, and her son. Therapist prompted Patient to discuss her support system and ways that she manages her daily stress, anger, and frustrations.  Therapist and Patient discussed education, long term goals and short term goals. Therapist challenged Patient to go beyond her previous thought patterns as would continuously limit herself throughout the discussion. Therapist encouraged Patient to focus on her strengths and the positive aspects of her life versus the opposite. Therapist assisted Patient with this task by guiding her through a short list of job task that she is able to complete.     Suicidal/Homicidal: Nowithout intent/plan  Therapist Response: LCSW provided Patient with ongoing emotional support and encouragement.  Normalized her feelings.  Commended Patient on her progress and reinforced the importance of client staying focused on her own strengths and resources and resiliency. Processed various strategies for dealing with stressors.    Plan: Return again in 2 weeks.  Diagnosis: Axis I: Bipolar, Manic    Axis II: No diagnosis    Lubertha South, LCSW 06/23/2016

## 2016-07-15 ENCOUNTER — Ambulatory Visit: Payer: PPO | Admitting: Psychiatry

## 2016-07-21 ENCOUNTER — Ambulatory Visit (INDEPENDENT_AMBULATORY_CARE_PROVIDER_SITE_OTHER): Payer: PPO | Admitting: Licensed Clinical Social Worker

## 2016-07-21 ENCOUNTER — Other Ambulatory Visit: Payer: Self-pay | Admitting: Nurse Practitioner

## 2016-07-21 DIAGNOSIS — F3110 Bipolar disorder, current episode manic without psychotic features, unspecified: Secondary | ICD-10-CM

## 2016-07-28 ENCOUNTER — Encounter: Payer: Self-pay | Admitting: Psychiatry

## 2016-07-28 ENCOUNTER — Ambulatory Visit (INDEPENDENT_AMBULATORY_CARE_PROVIDER_SITE_OTHER): Payer: PPO | Admitting: Psychiatry

## 2016-07-28 VITALS — BP 140/85 | HR 80 | Temp 98.8°F | Ht 61.0 in | Wt 190.0 lb

## 2016-07-28 DIAGNOSIS — F3110 Bipolar disorder, current episode manic without psychotic features, unspecified: Secondary | ICD-10-CM

## 2016-07-28 MED ORDER — ESCITALOPRAM OXALATE 10 MG PO TABS: ORAL_TABLET | ORAL | 1 refills | Status: DC

## 2016-07-28 NOTE — Progress Notes (Signed)
Patient ID: Julia Cooke, female   DOB: 05-Oct-1951, 65 y.o.   MRN: [id number]   [facility] MD/PA/NP OP Progress Note  07/28/2016 9:11 AM [name] Tyshia Beiter  MRN:  [id number]  Subjective:  Patient returns for follow-up of her bipolar disorder. She reports doing better in terms of her mood. She has been able to taper off the Abilify without any side effects. Reports taking the Lexapro intermittently. States she was unaware that she needs to take it daily. States she has begun to enjoy spending time with her family and her grandchildren. Reports that she continues to have days when she does not have motivation to do anything. Again discussed with her the importance of compliance with the Lexapro. States her anxiety is better. Denies any manic symptoms or suicidal thoughts.   Chief Complaint: Mood is improving Chief Complaint    Follow-up; Medication Refill     Visit Diagnosis:   No diagnosis found.  Past Medical History:  Past Medical History:  Diagnosis Date  . Bipolar disorder ([place])   . Depression   . Heart murmur   . Hypertension   . Nephrolithiasis 2000   s/p lithotripsy  . Psychotic disorder    mania    Past Surgical History:  Procedure Laterality Date  . LITHOTRIPSY  2000   Family History:  Family History  Problem Relation Age of Onset  . Hyperlipidemia Mother   . Stroke Mother   . Hypertension Mother   . Hyperlipidemia Father   . Hypertension Father   . Cancer Sister     breast  . Heart disease Brother     myocardial infarction  . Heart attack Brother   . Hypertension Brother   . Fibromyalgia Sister   . Hypertension Sister   . Heart Problems Sister   . Hypertension Sister    Social History:  Social History   Social History  . Marital status: Married    Spouse name: N/A  . Number of children: N/A  . Years of education: N/A   Social History Main Topics  . Smoking status: Never Smoker  . Smokeless tobacco: Never Used  . Alcohol use 0.0 - 2.4 oz/week   Comment: 2-3 times weekly  . Drug use: No  . Sexual activity: Yes    Birth control/ protection: Post-menopausal, None   Other Topics Concern  . None   Social History Narrative   Patient lives at home with her husband of 41 years. They have 3 adult children (2 boys, 1 girl). They recently moved back to [place] in January. 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   Additional History:   Assessment:   Musculoskeletal: Strength & Muscle Tone: within normal limits Gait & Station: normal Patient leans: N/A  Psychiatric Specialty Exam: Depression         Associated symptoms include does not have insomnia and no suicidal ideas. Medication Refill     Review of Systems  Psychiatric/Behavioral: Positive for depression. Negative for hallucinations, memory loss, substance abuse and suicidal ideas. The patient is not nervous/anxious and does not have insomnia.   All other systems reviewed and are negative.   Blood pressure 140/85, pulse 80, temperature 98.8 F (37.1 C), temperature source Oral, height 5\' 1"  (1.549 m), weight 190 lb (86.2 kg).Body mass index is 35.9 kg/m.  General Appearance: Neat and Well Groomed  Eye Contact:  Good  Speech:  Normal Rate  Volume:  Normal  Mood:  improved  Affect: Anxious   Thought Process:  Linear  Orientation:  Full (Time, Place, and Person)  Thought Content:  Negative  Suicidal Thoughts:  No  Homicidal Thoughts:  No  Memory:  Immediate;   Good Recent;   Good Remote;   Good  Judgement:  Good  Insight:  Good  Psychomotor Activity:  Negative  Concentration:  Good  Recall:  Good  Fund of Knowledge: Good  Language: Good  Akathisia:  Negative  Handed:   AIMS (if indicated):    Assets:  Communication Skills Desire for Improvement  ADL's:  Intact  Cognition: WNL  Sleep:  okay   Is the patient at risk to self?  No. Has the patient been a risk to self in the past 6  months?  No. Has the patient been a risk to self within the distant past?  Yes.   Is the patient a risk to others?  No. Has the patient been a risk to others in the past 6 months?  No. Has the patient been a risk to others within the distant past?  No.  Current Medications: Current Outpatient Prescriptions  Medication Sig Dispense Refill  . ARIPiprazole (ABILIFY) 2 MG tablet Take 1 tablet daily for 2 weeks and then discontinue. 30 tablet 0  . ergocalciferol (DRISDOL) 50000 units capsule Take 1 capsule (50,000 Units total) by mouth once a week. 12 capsule 0  . escitalopram (LEXAPRO) 10 MG tablet Take 1 tablet by mouth daily 30 tablet 1  . meloxicam (MOBIC) 15 MG tablet Take 1 tablet (15 mg total) by mouth daily as needed for pain. 10 tablet 0  . metoprolol succinate (TOPROL-XL) 25 MG 24 hr tablet TAKE 1 TABLET (25 MG TOTAL) BY MOUTH DAILY. 90 tablet 0  . Multiple Vitamin (MULTIVITAMIN) tablet Take 1 tablet by mouth daily.    . NON FORMULARY Take 1 capsule by mouth daily. Digestive enzymes    . Alpha Lipoic Acid-Cr-Cinnamon (CINNAMON ALPHA LIPOIC AC CMPLX) 150-0.1-500 MG CAPS Take by mouth.     No current facility-administered medications for this visit.     Medical Decision Making:  Established Problem, Stable/Improving (1) and Review of Medication Regimen & Side Effects (2)  Treatment Plan Summary:Medication management and Plan   Bipolar disorder type I-  Continue on the Lexapro 10 mg daily. Patient encouraged about the importance of compliance and to take it every day Continue with [facility] for therapy.  Anxiety-Same as above.  Return to clinic in 1 month's time or call before if necessary.   Seerat Peaden 07/28/2016, 9:11 AM

## 2016-08-05 NOTE — Progress Notes (Signed)
   THERAPIST PROGRESS NOTE  Session Time: 40min  Participation Level: Active  Behavioral Response: NeatAlertDepressed  Type of Therapy: Individual Therapy  Treatment Goals addressed: Coping  Interventions: CBT, Motivational Interviewing, Solution Focused and Strength-based  Summary: Julia Cooke is a 65 y.o. female who presents with symptoms of her diagnosis.  Therapist assisted Patient with a problem-solving exercise to assist with decreasing negative feelings associated with this issue. Therapist facilitated an intervention by prompting Patient to discuss a short term goal, which in this case was also her problem. Therapist then challenged Patient to be specific regarding circumstances that make this a problem for her at this time. Therapist challenged Patient to list 10-12 solutions to her problem. Therapist then prompted Patient to evaluate the alternatives that she devised. Once Patient narrowed her options down, Therapist encouraged her to decide on a solution to her problem   Suicidal/Homicidal: No  Therapist Response: Provided support for Patient as she discussed her current mood. Stressed the importance of mood stabilization through learned coping skills and medication management.  Encouraged Patient to focus on her strengths and the positive aspects.  Plan: Return again in2 weeks.  Diagnosis: Axis I: Bipolar, Depressed    Axis II: No diagnosis    Lubertha South, LCSW 07/22/2016

## 2016-08-19 ENCOUNTER — Encounter: Payer: Self-pay | Admitting: Family Medicine

## 2016-08-19 ENCOUNTER — Ambulatory Visit (INDEPENDENT_AMBULATORY_CARE_PROVIDER_SITE_OTHER): Payer: PPO | Admitting: Family Medicine

## 2016-08-19 ENCOUNTER — Ambulatory Visit (INDEPENDENT_AMBULATORY_CARE_PROVIDER_SITE_OTHER): Payer: PPO | Admitting: Licensed Clinical Social Worker

## 2016-08-19 DIAGNOSIS — L237 Allergic contact dermatitis due to plants, except food: Secondary | ICD-10-CM

## 2016-08-19 DIAGNOSIS — F3110 Bipolar disorder, current episode manic without psychotic features, unspecified: Secondary | ICD-10-CM | POA: Diagnosis not present

## 2016-08-19 MED ORDER — PREDNISONE 10 MG PO TABS: ORAL_TABLET | ORAL | 0 refills | Status: DC

## 2016-08-19 NOTE — Progress Notes (Signed)
   THERAPIST PROGRESS NOTE  Session Time: 56  Participation Level: Active  Behavioral Response: CasualAlertDepressed  Type of Therapy: Individual Therapy  Treatment Goals addressed: Coping  Interventions: CBT, Motivational Interviewing, Solution Focused and Reframing  Summary: Jennylee Dellarocca is a 65 y.o. female who presents with continued symptoms of her diagnosis.  She was able to vent her frustration about the family finances and her constant worry.  She reports that she recently obtained poison ivy and will make an appointment to see her Primary Care Physician to get a shot to reduce the side effects.  She reports that she has stopped taking her Abilify due to lack of change in her thought process.  Discussion on what she wants to improve on and how to make the first steps.  Patient reports that her husband has been obtaining several contracts to increase revenue but she dislikes him leaving the home for an extended amount of time.  Discussion of emotional balancing.  Discussion of the importance of a daily routine to ensure an increase in motivation.  Patient has obtained a gym membership to [employer] and has a Physiological scientist twice weekly.   Suicidal/Homicidal: No  Therapist Response: LCSW provided Patient with ongoing emotional support and encouragement.  Normalized her feelings.  Commended Patient on her progress and reinforced the importance of client staying focused on her own strengths and resources and resiliency. Processed various strategies for dealing with stressors.    Plan: Return again in 4 weeks.  Diagnosis: Axis I: Bipolar, Depressed    Axis II: No diagnosis    Lubertha South, LCSW 08/19/2016

## 2016-08-19 NOTE — Progress Notes (Signed)
Subjective:  Patient ID: Julia Cooke, female    DOB: January 05, 1951  Age: 65 y.o. MRN: [id number]  CC: Rash  HPI:  65 year old female presents with complaints of rash.  Patient states that she developed a rash earlier this week on Monday. She states that it started after she was outside pulling weeds. It is located on her lower arms as well as her lower legs. Associated itching. Some drainage. She's tried some over-the-counter medications/topicals without significant improvement. No known exacerbating or relieving factors. No other associated symptoms. No other complaints at this time.  Social Hx   Social History   Social History  . Marital status: Married    Spouse name: N/A  . Number of children: N/A  . Years of education: N/A   Social History Main Topics  . Smoking status: Never Smoker  . Smokeless tobacco: Never Used  . Alcohol use 0.0 - 2.4 oz/week     Comment: 2-3 times weekly  . Drug use: No  . Sexual activity: Yes    Birth control/ protection: Post-menopausal, None   Other Topics Concern  . None   Social History Narrative   Patient lives at home with her husband of 90 years. They have 3 adult children (2 boys, 1 girl). They recently moved back to [place] in January. 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    Review of Systems  Constitutional: Negative.   Skin: Positive for rash.   Objective:  BP 124/72 (BP Location: Left Arm, Patient Position: Sitting, Cuff Size: Normal)   Pulse 80   Temp 98.5 F (36.9 C) (Oral)   Wt 187 lb 8 oz (85 kg)   BMI 35.43 kg/m   BP/Weight 08/19/2016 07/28/2016 [id number]  Systolic BP [id number]  Diastolic BP 72 85 86  Wt. (Lbs) 187.5 190 195  BMI 35.43 35.9 36.86   Physical Exam  Constitutional: She is oriented to person, place, and time. She appears well-developed. No distress.  Pulmonary/Chest: Effort normal.  Neurological: She is alert and oriented to  person, place, and time.  Skin:  Areas of vesicular rash with surrounding erythema noted on on the forearms/wrist. Some noted on the lower extremities as well.  Psychiatric:  Flat affect.   Vitals reviewed.   Lab Results  Component Value Date   WBC 6.2 12/25/2015   HGB 14.7 12/25/2015   HCT 43.4 12/25/2015   PLT 235.0 12/25/2015   GLUCOSE 102 (H) 12/25/2015   CHOL 212 (H) 12/25/2015   TRIG 95.0 12/25/2015   HDL 50.80 12/25/2015   LDLDIRECT 181.7 01/07/2014   LDLCALC 143 (H) 12/25/2015   ALT 24 12/25/2015   AST 21 12/25/2015   NA 140 12/25/2015   K 5.0 12/25/2015   CL 104 12/25/2015   CREATININE 0.73 12/25/2015   BUN 19 12/25/2015   CO2 30 12/25/2015   TSH 1.20 12/25/2015   HGBA1C 4.8 04/10/2014    Assessment & Plan:   Problem List Items Addressed This Visit    Poison ivy dermatitis    New acute problem. Discussed treatment with topical steroids versus systemic. Patient elected for systemic steroids. Rx given today for prednisone taper. I advised the patient and her husband to watch out for behavior changes as she has a history of mania/bipolar disorder.       Other Visit Diagnoses   None.     Meds ordered this encounter  Medications  .  predniSONE (DELTASONE) 10 MG tablet    Sig: 50 mg (5 tablets) daily x 3 days, then 40 mg (4 tablets) daily x 3 days, then 30 mg (3 tablets) daily x 3 days, then 20 mg (2 tablets) daily x 3 days, then 10 mg (1 tablet) daily x 3 days.    Dispense:  45 tablet    Refill:  0    Follow-up: PRN  [place]

## 2016-08-19 NOTE — Progress Notes (Signed)
Pre visit review using our clinic review tool, if applicable. No additional management support is needed unless otherwise documented below in the visit note. 

## 2016-08-19 NOTE — Assessment & Plan Note (Signed)
New acute problem. Discussed treatment with topical steroids versus systemic. Patient elected for systemic steroids. Rx given today for prednisone taper. I advised the patient and her husband to watch out for behavior changes as she has a history of mania/bipolar disorder.

## 2016-08-19 NOTE — Patient Instructions (Signed)
Take the prednisone as prescribed.   Follow up as needed  Take care  Dr. Lacinda Axon

## 2016-08-23 ENCOUNTER — Ambulatory Visit (INDEPENDENT_AMBULATORY_CARE_PROVIDER_SITE_OTHER): Payer: PPO | Admitting: Family Medicine

## 2016-08-23 ENCOUNTER — Encounter: Payer: Self-pay | Admitting: Family Medicine

## 2016-08-23 DIAGNOSIS — L237 Allergic contact dermatitis due to plants, except food: Secondary | ICD-10-CM | POA: Diagnosis not present

## 2016-08-23 NOTE — Assessment & Plan Note (Signed)
Rash is most consistent with poison ivy dermatitis. Given that she has taken her prednisone today we will hold off on injectable steroid until tomorrow. She will return for a nurse visit to receive Decadron. She will not take her prednisone tomorrow. Once she has received the injection she will resume prednisone the day after. Advised against using alcohol and triamcinolone on the lesions. She can continue calamine. She can start Benadryl for itching. She's given return precautions.

## 2016-08-23 NOTE — Progress Notes (Signed)
  Tommi Rumps, MD Phone: [phone]  Julia Cooke is a 65 y.o. female who presents today for follow-up.  Poison ivy dermatitis: Patient seen on the 21st for similar symptoms. Notes it was on her right arm though now she has it on bilateral arms, right lower leg, and underneath her right breast. She's been on prednisone taper though has not been beneficial. Using alcohol, triamcinolone, and calamine. Itches significantly. Feels at her baseline. No fevers. Has a long history of poison ivy dermatitis.  ROS see history of present illness  Objective  Physical Exam Vitals:   08/23/16 1506  BP: 128/84  Pulse: 74  Temp: 98.8 F (37.1 C)    BP Readings from Last 3 Encounters:  08/23/16 128/84  08/19/16 124/72  07/28/16 140/85   Wt Readings from Last 3 Encounters:  08/23/16 189 lb (85.7 kg)  08/19/16 187 lb 8 oz (85 kg)  07/28/16 190 lb (86.2 kg)    Physical Exam  Constitutional: No distress.  Cardiovascular: Normal rate, regular rhythm and normal heart sounds.   Pulmonary/Chest: Effort normal and breath sounds normal.  Neurological: She is alert. Gait normal.  Skin: Skin is warm and dry. She is not diaphoretic.  Scattered vesicular lesions in linear distributions on bilateral forearms, right lower leg, and under medial aspect right breast consistent with poison ivy dermatitis     Assessment/Plan: Please see individual problem list.  Poison ivy dermatitis Rash is most consistent with poison ivy dermatitis. Given that she has taken her prednisone today we will hold off on injectable steroid until tomorrow. She will return for a nurse visit to receive Decadron. She will not take her prednisone tomorrow. Once she has received the injection she will resume prednisone the day after. Advised against using alcohol and triamcinolone on the lesions. She can continue calamine. She can start Benadryl for itching. She's given return precautions.   Tommi Rumps, MD [place]

## 2016-08-23 NOTE — Patient Instructions (Signed)
Nice to meet you. You have poison ivy. I would like for you to return tomorrow to get an injection of Decadron. You should not take your prednisone tomorrow. If you develop worsening rash, or any new symptoms please let us know.

## 2016-08-24 ENCOUNTER — Encounter: Payer: Self-pay | Admitting: Psychiatry

## 2016-08-24 ENCOUNTER — Ambulatory Visit (INDEPENDENT_AMBULATORY_CARE_PROVIDER_SITE_OTHER): Payer: PPO

## 2016-08-24 ENCOUNTER — Ambulatory Visit (INDEPENDENT_AMBULATORY_CARE_PROVIDER_SITE_OTHER): Payer: PPO | Admitting: Psychiatry

## 2016-08-24 VITALS — BP 150/84 | HR 82 | Temp 98.0°F | Ht 61.0 in | Wt 188.4 lb

## 2016-08-24 DIAGNOSIS — L237 Allergic contact dermatitis due to plants, except food: Secondary | ICD-10-CM

## 2016-08-24 DIAGNOSIS — F3110 Bipolar disorder, current episode manic without psychotic features, unspecified: Secondary | ICD-10-CM | POA: Diagnosis not present

## 2016-08-24 MED ORDER — METHYLPREDNISOLONE ACETATE 40 MG/ML IJ SUSP
40.0000 mg | Freq: Once | INTRAMUSCULAR | Status: AC
  Administered 2016-08-24: 40 mg via INTRAMUSCULAR

## 2016-08-24 MED ORDER — QUETIAPINE FUMARATE 25 MG PO TABS: 25.0000 mg | ORAL_TABLET | Freq: Two times a day (BID) | ORAL | 2 refills | Status: DC

## 2016-08-24 NOTE — Progress Notes (Signed)
Patient ID: Julia Cooke, female   DOB: 08/02/1951, 65 y.o.   MRN: [id number]   [facility] MD/PA/NP OP Progress Note  08/24/2016 9:53 AM Julia Cooke  MRN:  [id number]  Subjective:  Patient returns for follow-up of her bipolar disorder. Patient states the Lexapro has not been helpful. She reports that she has not been taking the medication consistently like we had discussed previously. Patient feels that she should not be depending on medications. Also reports that it is a struggle for her to get up each day and do the activity she wants to do. States that she does enjoy her family. Over the past 2 weeks on a scale of 1-10,10 being her best mood and 1 being very depressed she reports being at a 5 or 6 at her best. Denies any manic symptoms or suicidal thoughts.   Chief Complaint: Okay mood  Visit Diagnosis:     ICD-9-CM ICD-10-CM   1. Bipolar I disorder, most recent episode (or current) manic ([name]) 296.40 F31.10     Past Medical History:  Past Medical History:  Diagnosis Date  . Bipolar disorder ([place])   . Depression   . Heart murmur   . Hypertension   . Nephrolithiasis 2000   s/p lithotripsy  . Psychotic disorder    mania    Past Surgical History:  Procedure Laterality Date  . LITHOTRIPSY  2000   Family History:  Family History  Problem Relation Age of Onset  . Hyperlipidemia Mother   . Stroke Mother   . Hypertension Mother   . Hyperlipidemia Father   . Hypertension Father   . Cancer Sister     breast  . Heart disease Brother     myocardial infarction  . Heart attack Brother   . Hypertension Brother   . Fibromyalgia Sister   . Hypertension Sister   . Heart Problems Sister   . Hypertension Sister    Social History:  Social History   Social History  . Marital status: Married    Spouse name: N/A  . Number of children: N/A  . Years of education: N/A   Social History Main Topics  . Smoking status: Never Smoker  . Smokeless tobacco: Never Used  . Alcohol use 0.0 -  2.4 oz/week     Comment: 2-3 times weekly  . Drug use: No  . Sexual activity: Yes    Birth control/ protection: Post-menopausal, None   Other Topics Concern  . Not on file   Social History Narrative   Patient lives at home with her husband of 62 years. They have 3 adult children (2 boys, 1 girl). They recently moved back to [place] in January. 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   Additional History:   Assessment:   Musculoskeletal: Strength & Muscle Tone: within normal limits Gait & Station: normal Patient leans: N/A  Psychiatric Specialty Exam: Medication Refill   Depression         Associated symptoms include does not have insomnia and no suicidal ideas.   Review of Systems  Psychiatric/Behavioral: Positive for depression. Negative for hallucinations, memory loss, substance abuse and suicidal ideas. The patient is not nervous/anxious and does not have insomnia.   All other systems reviewed and are negative.   There were no vitals taken for this visit.There is no height or weight on file to calculate BMI.  General Appearance: Neat and Well Groomed  Eye Contact:  Good  Speech:  Normal Rate  Volume:  Normal  Mood:  improved  Affect: Anxious   Thought Process:  Linear  Orientation:  Full (Time, Place, and Person)  Thought Content:  Negative  Suicidal Thoughts:  No  Homicidal Thoughts:  No  Memory:  Immediate;   Good Recent;   Good Remote;   Good  Judgement:  Good  Insight:  Good  Psychomotor Activity:  Negative  Concentration:  Good  Recall:  Good  Fund of Knowledge: Good  Language: Good  Akathisia:  Negative  Handed:   AIMS (if indicated):    Assets:  Communication Skills Desire for Improvement  ADL's:  Intact  Cognition: WNL  Sleep:  okay   Is the patient at risk to self?  No. Has the patient been a risk to self in the past 6 months?  No. Has the patient been a risk to self  within the distant past?  Yes.   Is the patient a risk to others?  No. Has the patient been a risk to others in the past 6 months?  No. Has the patient been a risk to others within the distant past?  No.  Current Medications: Current Outpatient Prescriptions  Medication Sig Dispense Refill  . Alpha Lipoic Acid-Cr-Cinnamon (CINNAMON ALPHA LIPOIC AC CMPLX) 150-0.1-500 MG CAPS Take by mouth.    . ergocalciferol (DRISDOL) 50000 units capsule Take 1 capsule (50,000 Units total) by mouth once a week. 12 capsule 0  . meloxicam (MOBIC) 15 MG tablet Take 1 tablet (15 mg total) by mouth daily as needed for pain. (Patient not taking: Reported on 08/23/2016) 10 tablet 0  . metoprolol succinate (TOPROL-XL) 25 MG 24 hr tablet TAKE 1 TABLET (25 MG TOTAL) BY MOUTH DAILY. 90 tablet 0  . Multiple Vitamin (MULTIVITAMIN) tablet Take 1 tablet by mouth daily.    . NON FORMULARY Take 1 capsule by mouth daily. Digestive enzymes    . predniSONE (DELTASONE) 10 MG tablet 50 mg (5 tablets) daily x 3 days, then 40 mg (4 tablets) daily x 3 days, then 30 mg (3 tablets) daily x 3 days, then 20 mg (2 tablets) daily x 3 days, then 10 mg (1 tablet) daily x 3 days. 45 tablet 0  . QUEtiapine (SEROQUEL) 25 MG tablet Take 1 tablet (25 mg total) by mouth 2 (two) times daily. 60 tablet 2   No current facility-administered medications for this visit.     Medical Decision Making:  Established Problem, Stable/Improving (1) and Review of Medication Regimen & Side Effects (2)  Treatment Plan Summary:Medication management and Plan   Bipolar disorder type I- depressed Discontinue the Lexapro. Start seroquel at 25mg  po bid.side effects and benefits explained, along with metabolic effects , patient gave verbal consent. Patient encouraged about the importance of compliance and to take it every day Continue with Julia Cooke for therapy.  Anxiety-Same as above.  Return to clinic in 2 weeks time or call before if necessary.   Lakin Rhine,  Avey Mcmanamon 08/24/2016, 9:53 AM

## 2016-08-24 NOTE — Progress Notes (Addendum)
Patient came in for injection, received in left deltoid.  Tolerated well.   Reviewed office note and assessment and plan.  Pt was instructed by Dr Caryl Bis to return for decadron injection.  Per review, injection given and pt tolerated.    Dr Nicki Reaper

## 2016-08-26 ENCOUNTER — Ambulatory Visit: Payer: PPO | Admitting: Psychiatry

## 2016-08-31 ENCOUNTER — Telehealth: Payer: Self-pay | Admitting: Internal Medicine

## 2016-08-31 ENCOUNTER — Telehealth: Payer: Self-pay | Admitting: *Deleted

## 2016-08-31 ENCOUNTER — Telehealth: Payer: Self-pay

## 2016-08-31 NOTE — Telephone Encounter (Signed)
pt called states she thinks she having a side effect to the seroquel.  pt states she has been feeling light headed.  dizzy headed.  pt states that she started taking the medication on the 26th.  pt has appt on  09-07-16.   Medication issues.

## 2016-08-31 NOTE — Telephone Encounter (Signed)
See previous note

## 2016-08-31 NOTE — Telephone Encounter (Signed)
per dr. Einar Grad order call patient and tell her to cut out the am dosage.  just take once daily.

## 2016-08-31 NOTE — Telephone Encounter (Signed)
Please call pt at [phone] today

## 2016-08-31 NOTE — Telephone Encounter (Signed)
Called and spoke with patient about outbreak. She stated that she still has a couple spots that are not cleared up and a few new spots. I have scheduled patient to come in 09/01/16 @ 10:30

## 2016-08-31 NOTE — Telephone Encounter (Signed)
Noted  

## 2016-08-31 NOTE — Telephone Encounter (Signed)
Dr. Caryl Bis saw patient for poison ivy dermatitis please advise as to need for further treatment , patient stated she has had some new out breaks and some look to be healing. Still itching. Treated with prednisone 10 mg on 08/19/16 , 15 day taper by Dr. Lacinda Axon,

## 2016-08-31 NOTE — Telephone Encounter (Signed)
Pt called stating that she only has 2 days worth of  predniSONE (DELTASONE) 10 MG tablet. Pt stated that the rash is still spreading but not as bad. Some spots that look like it is healing, but other parts look like it is spreading, the spots are still itchy. Please advise, thank you!  Call pt @ [phone]

## 2016-09-01 ENCOUNTER — Ambulatory Visit: Payer: PPO | Admitting: Family Medicine

## 2016-09-02 ENCOUNTER — Ambulatory Visit (INDEPENDENT_AMBULATORY_CARE_PROVIDER_SITE_OTHER): Payer: PPO | Admitting: Family Medicine

## 2016-09-02 ENCOUNTER — Encounter: Payer: Self-pay | Admitting: Family Medicine

## 2016-09-02 DIAGNOSIS — L237 Allergic contact dermatitis due to plants, except food: Secondary | ICD-10-CM | POA: Diagnosis not present

## 2016-09-02 MED ORDER — TRIAMCINOLONE ACETONIDE 0.1 % EX CREA: 1.0000 "application " | TOPICAL_CREAM | Freq: Two times a day (BID) | CUTANEOUS | 0 refills | Status: DC

## 2016-09-02 MED ORDER — PREDNISONE 10 MG PO TABS: 10.0000 mg | ORAL_TABLET | Freq: Every day | ORAL | 0 refills | Status: DC

## 2016-09-02 NOTE — Progress Notes (Signed)
  Tommi Rumps, MD Phone: [phone]  Julia Cooke is a 65 y.o. female who presents today for follow-up.  Poison ivy dermatitis: Patient notes overall improved though has had a couple of new splotches pop up. They're on her left side and right chest. The itch. She took the prednisone incorrectly on a couple of days and has run out 2 days early. She's not using anything topically. No fevers.   ROS see history of present illness  Objective  Physical Exam Vitals:   09/02/16 0947  BP: 124/78  Pulse: 79  Temp: 98.4 F (36.9 C)    BP Readings from Last 3 Encounters:  09/02/16 124/78  08/24/16 (!) 150/84  08/23/16 128/84   Wt Readings from Last 3 Encounters:  09/02/16 184 lb 8 oz (83.7 kg)  08/24/16 188 lb 6.4 oz (85.5 kg)  08/23/16 189 lb (85.7 kg)    Physical Exam  Constitutional: She is well-developed, well-nourished, and in no distress.  Cardiovascular: Normal rate, regular rhythm and normal heart sounds.   Pulmonary/Chest: Effort normal and breath sounds normal.  Skin: Skin is warm and dry.  Scattered patches of erythematous papules with some of them in a linear distribution on her left flank and right chest, rash that was seen elsewhere on her arms is well-healing     Assessment/Plan: Please see individual problem list.  Poison ivy dermatitis Patient with a couple new spots of rash. Linear distribution would argue for allergic contact dermatitis likely related to poison ivy given her prior history. We will give her 2 additional days of prednisone to finish out her taper. She will use triamcinolone cream on the 2 new areas. If not improved by next week will refer to dermatology term precautions.   No orders of the defined types were placed in this encounter.   Meds ordered this encounter  Medications  . DISCONTD: predniSONE (DELTASONE) 10 MG tablet    Sig: Take 1 tablet (10 mg total) by mouth daily with breakfast.    Dispense:  2 tablet    Refill:  0  .  triamcinolone cream (KENALOG) 0.1 %    Sig: Apply 1 application topically 2 (two) times daily.    Dispense:  30 g    Refill:  0  . predniSONE (DELTASONE) 10 MG tablet    Sig: Take 1 tablet (10 mg total) by mouth daily with breakfast.    Dispense:  2 tablet    Refill:  0    Tommi Rumps, MD [place]

## 2016-09-02 NOTE — Assessment & Plan Note (Signed)
Patient with a couple new spots of rash. Linear distribution would argue for allergic contact dermatitis likely related to poison ivy given her prior history. We will give her 2 additional days of prednisone to finish out her taper. She will use triamcinolone cream on the 2 new areas. If not improved by next week will refer to dermatology term precautions.

## 2016-09-02 NOTE — Patient Instructions (Signed)
Nice to see you. We will have you finish your course of prednisone. I have prescribed triamcinolone for use on the areas that are still present as well. If you develop spreading rash or fevers please seek medical attention.

## 2016-09-07 ENCOUNTER — Ambulatory Visit (INDEPENDENT_AMBULATORY_CARE_PROVIDER_SITE_OTHER): Payer: PPO | Admitting: Psychiatry

## 2016-09-07 ENCOUNTER — Encounter: Payer: Self-pay | Admitting: Psychiatry

## 2016-09-07 VITALS — BP 137/83 | HR 91 | Temp 98.6°F | Wt 186.0 lb

## 2016-09-07 DIAGNOSIS — F3110 Bipolar disorder, current episode manic without psychotic features, unspecified: Secondary | ICD-10-CM | POA: Diagnosis not present

## 2016-09-07 MED ORDER — ARIPIPRAZOLE 2 MG PO TABS
2.0000 mg | ORAL_TABLET | Freq: Every day | ORAL | 1 refills | Status: DC
Start: 2016-09-07 — End: 2016-10-05

## 2016-09-07 NOTE — Progress Notes (Signed)
Patient ID: Julia Cooke, female   DOB: Apr 27, 1951, 65 y.o.   MRN: [id number]   [facility] MD/PA/NP OP Progress Note  09/07/2016 9:43 AM Julia Cooke  MRN:  [id number]  Subjective:  Patient returns for follow-up of her bipolar disorder. She was started on Seroquel 25 mg twice daily last time. Patient today reports that down she felt that her motivation was decreased and she felt like her mind was cloudy most of the time. States that she feels it still takes an effort to do anything and she would like to be up and about and do her activities. Denies any suicidal thoughts. Denies any racing thoughts.  Chief Complaint: Okay mood Chief Complaint    Follow-up; Medication Refill     Visit Diagnosis:   No diagnosis found.  Past Medical History:  Past Medical History:  Diagnosis Date  . Bipolar disorder ([place])   . Depression   . Heart murmur   . Hypertension   . Nephrolithiasis 2000   s/p lithotripsy  . Psychotic disorder    mania    Past Surgical History:  Procedure Laterality Date  . LITHOTRIPSY  2000   Family History:  Family History  Problem Relation Age of Onset  . Hyperlipidemia Mother   . Stroke Mother   . Hypertension Mother   . Hyperlipidemia Father   . Hypertension Father   . Cancer Sister     breast  . Heart disease Brother     myocardial infarction  . Heart attack Brother   . Hypertension Brother   . Fibromyalgia Sister   . Hypertension Sister   . Heart Problems Sister   . Hypertension Sister    Social History:  Social History   Social History  . Marital status: Married    Spouse name: N/A  . Number of children: N/A  . Years of education: N/A   Social History Main Topics  . Smoking status: Never Smoker  . Smokeless tobacco: Never Used  . Alcohol use 0.0 - 2.4 oz/week     Comment: 2-3 times weekly  . Drug use: No  . Sexual activity: Yes    Birth control/ protection: Post-menopausal, None   Other Topics Concern  . None   Social History Narrative   Patient lives at home with her husband of 6 years. They have 3 adult children (2 boys, 1 girl). They recently moved back to [place] in January. 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   Additional History:   Assessment:   Musculoskeletal: Strength & Muscle Tone: within normal limits Gait & Station: normal Patient leans: N/A  Psychiatric Specialty Exam: Medication Refill   Depression         Associated symptoms include does not have insomnia and no suicidal ideas.   Review of Systems  Psychiatric/Behavioral: Positive for depression. Negative for hallucinations, memory loss, substance abuse and suicidal ideas. The patient is not nervous/anxious and does not have insomnia.   All other systems reviewed and are negative.   Blood pressure 137/83, pulse 91, temperature 98.6 F (37 C), temperature source Oral, weight 186 lb (84.4 kg).Body mass index is 35.14 kg/m.  General Appearance: Neat and Well Groomed  Eye Contact:  Good  Speech:  Normal Rate  Volume:  Normal  Mood:  okay  Affect: Anxious   Thought Process:  Linear  Orientation:  Full (Time, Place, and Person)  Thought Content:  Negative  Suicidal Thoughts:  No  Homicidal Thoughts:  No  Memory:  Immediate;   Good Recent;   Good Remote;   Good  Judgement:  Good  Insight:  Good  Psychomotor Activity:  Negative  Concentration:  Good  Recall:  Good  Fund of Knowledge: Good  Language: Good  Akathisia:  Negative  Handed:   AIMS (if indicated):    Assets:  Communication Skills Desire for Improvement  ADL's:  Intact  Cognition: WNL  Sleep:  okay   Is the patient at risk to self?  No. Has the patient been a risk to self in the past 6 months?  No. Has the patient been a risk to self within the distant past?  Yes.   Is the patient a risk to others?  No. Has the patient been a risk to others in the past 6 months?  No. Has the patient been a risk to  others within the distant past?  No.  Current Medications: Current Outpatient Prescriptions  Medication Sig Dispense Refill  . metoprolol succinate (TOPROL-XL) 25 MG 24 hr tablet TAKE 1 TABLET (25 MG TOTAL) BY MOUTH DAILY. 90 tablet 0  . Multiple Vitamin (MULTIVITAMIN) tablet Take 1 tablet by mouth daily.    . NON FORMULARY Take 1 capsule by mouth daily. Digestive enzymes    . QUEtiapine (SEROQUEL) 25 MG tablet Take 1 tablet (25 mg total) by mouth 2 (two) times daily. 60 tablet 2   No current facility-administered medications for this visit.     Medical Decision Making:  Established Problem, Stable/Improving (1) and Review of Medication Regimen & Side Effects (2)  Treatment Plan Summary:Medication management and Plan   Bipolar disorder type I- depressed Discontinue the seroquel Start Abilifty at 2mg  po daily. Continue with Julia Cooke for therapy.  Anxiety-Same as above.  Return to clinic in 4 weeks time or call before if necessary.   Julia Cooke 09/07/2016, 9:43 AM

## 2016-09-16 ENCOUNTER — Ambulatory Visit: Payer: PPO | Admitting: Licensed Clinical Social Worker

## 2016-09-27 ENCOUNTER — Ambulatory Visit (INDEPENDENT_AMBULATORY_CARE_PROVIDER_SITE_OTHER): Payer: PPO | Admitting: Licensed Clinical Social Worker

## 2016-09-27 DIAGNOSIS — F3110 Bipolar disorder, current episode manic without psychotic features, unspecified: Secondary | ICD-10-CM | POA: Diagnosis not present

## 2016-09-29 NOTE — Progress Notes (Signed)
   THERAPIST PROGRESS NOTE  Session Time:45  Participation Level: Active  Behavioral Response: CasualAlertEuthymic  Type of Therapy: Individual Therapy  Treatment Goals addressed: Coping  Interventions: CBT, Motivational Interviewing and Solution Focused  Summary: Julia Cooke is a 65 y.o. female who presents with continued symptoms of her diagnosis.  Patient reports that she is slightly less anxious than a few months ago.  Patient continues to have anxiety about finances.  Patient is currently volunteering at her church and exercises regularly.  Patient has a Physiological scientist to ensure her attendance.  Patient continues to have difficulty with stress and depression at times.  Patient worked on Radiographer, therapeutic to develop during winter months.  Discussion of planning trips with friends.   Suicidal/Homicidal: No  Therapist Response: LCSW provided Patient with ongoing emotional support and encouragement.  Normalized her feelings.  Commended Patient on her progress and reinforced the importance of client staying focused on her own strengths and resources and resiliency. Processed various strategies for dealing with stressors.    Plan: Return again in2 weeks.  Diagnosis: Axis I: Bipolar    Axis II: No diagnosis    Lubertha South, LCSW 09/29/2016

## 2016-10-05 ENCOUNTER — Encounter: Payer: Self-pay | Admitting: Psychiatry

## 2016-10-05 ENCOUNTER — Ambulatory Visit (INDEPENDENT_AMBULATORY_CARE_PROVIDER_SITE_OTHER): Payer: PPO | Admitting: Psychiatry

## 2016-10-05 VITALS — BP 146/84 | HR 78 | Temp 97.5°F | Resp 16 | Wt 185.0 lb

## 2016-10-05 DIAGNOSIS — F3110 Bipolar disorder, current episode manic without psychotic features, unspecified: Secondary | ICD-10-CM

## 2016-10-05 MED ORDER — ARIPIPRAZOLE 2 MG PO TABS: 2.0000 mg | ORAL_TABLET | Freq: Every day | ORAL | 2 refills | Status: DC

## 2016-10-05 NOTE — Progress Notes (Signed)
Patient ID: Julia Cooke, female   DOB: March 10, 1951, 65 y.o.   MRN: [id number]   [facility] MD/PA/NP OP Progress Note  10/05/2016 10:40 AM [name] Lakiva [place]ham  MRN:  [id number]  Subjective:  Patient returns for follow-up of her bipolar disorder. She reports that she has tolerated the Abilify quite well. States that she's been feeling more energetic and able to do more things. States that she is not as anxious. She's been working towards losing weight and eating healthier. She is been enjoying her family. Most recently she went up to [place] to attend solicitation of her brother. Denies any suicidal thoughts. States she is feeling the best she has in the long time.  Chief Complaint: Much improved  Visit Diagnosis:     ICD-9-CM ICD-10-CM   1. Bipolar I disorder, most recent episode (or current) manic ([place]) 296.40 F31.10     Past Medical History:  Past Medical History:  Diagnosis Date  . Bipolar disorder ([place])   . Depression   . Heart murmur   . Hypertension   . Nephrolithiasis 2000   s/p lithotripsy  . Psychotic disorder    mania    Past Surgical History:  Procedure Laterality Date  . LITHOTRIPSY  2000   Family History:  Family History  Problem Relation Age of Onset  . Hyperlipidemia Mother   . Stroke Mother   . Hypertension Mother   . Hyperlipidemia Father   . Hypertension Father   . Cancer Sister     breast  . Heart disease Brother     myocardial infarction  . Heart attack Brother   . Hypertension Brother   . Fibromyalgia Sister   . Hypertension Sister   . Heart Problems Sister   . Hypertension Sister    Social History:  Social History   Social History  . Marital status: Married    Spouse name: N/A  . Number of children: N/A  . Years of education: N/A   Social History Main Topics  . Smoking status: Never Smoker  . Smokeless tobacco: Never Used  . Alcohol use 0.0 - 2.4 oz/week     Comment: 2-3 times weekly  . Drug use: No  . Sexual activity: Yes    Birth control/  protection: Post-menopausal, None   Other Topics Concern  . Not on file   Social History Narrative   Patient lives at home with her husband of 63 years. They have 3 adult children (2 boys, 1 girl). They recently moved back to [place] in January. 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   Additional History:   Assessment:   Musculoskeletal: Strength & Muscle Tone: within normal limits Gait & Station: normal Patient leans: N/A  Psychiatric Specialty Exam: Medication Refill   Depression         Associated symptoms include does not have insomnia and no suicidal ideas.   Review of Systems  Psychiatric/Behavioral: Negative for depression, hallucinations, memory loss, substance abuse and suicidal ideas. The patient is not nervous/anxious and does not have insomnia.   All other systems reviewed and are negative.   There were no vitals taken for this visit.There is no height or weight on file to calculate BMI.  General Appearance: Neat and Well Groomed  Eye Contact:  Good  Speech:  Normal Rate  Volume:  Normal  Mood:  Better   Affect: Pleasant and smiling   Thought Process:  Linear  Orientation:  Full (Time, Place, and Person)  Thought Content:  Negative  Suicidal Thoughts:  No  Homicidal Thoughts:  No  Memory:  Immediate;   Good Recent;   Good Remote;   Good  Judgement:  Good  Insight:  Good  Psychomotor Activity:  Negative  Concentration:  Good  Recall:  Good  Fund of Knowledge: Good  Language: Good  Akathisia:  Negative  Handed:   AIMS (if indicated):    Assets:  Communication Skills Desire for Improvement  ADL's:  Intact  Cognition: WNL  Sleep:  okay   Is the patient at risk to self?  No. Has the patient been a risk to self in the past 6 months?  No. Has the patient been a risk to self within the distant past?  Yes.   Is the patient a risk to others?  No. Has the patient been a risk to  others in the past 6 months?  No. Has the patient been a risk to others within the distant past?  No.  Current Medications: Current Outpatient Prescriptions  Medication Sig Dispense Refill  . ARIPiprazole (ABILIFY) 2 MG tablet Take 1 tablet (2 mg total) by mouth daily. 30 tablet 1  . metoprolol succinate (TOPROL-XL) 25 MG 24 hr tablet TAKE 1 TABLET (25 MG TOTAL) BY MOUTH DAILY. 90 tablet 0  . Multiple Vitamin (MULTIVITAMIN) tablet Take 1 tablet by mouth daily.    . NON FORMULARY Take 1 capsule by mouth daily. Digestive enzymes     No current facility-administered medications for this visit.     Medical Decision Making:  Established Problem, Stable/Improving (1) and Review of Medication Regimen & Side Effects (2)  Treatment Plan Summary:Medication management and Plan   Bipolar disorder type I- depressed  Continue Abilifty at 2mg  po daily. Continue with [place] [facility] for therapy.  Anxiety-Same as above.  Return to clinic in 3 months time or call before if necessary.   Malik Ruffino 10/05/2016, 10:40 AM

## 2016-10-16 ENCOUNTER — Other Ambulatory Visit: Payer: Self-pay | Admitting: Nurse Practitioner

## 2016-10-29 DIAGNOSIS — I499 Cardiac arrhythmia, unspecified: Secondary | ICD-10-CM

## 2016-10-29 HISTORY — DX: Cardiac arrhythmia, unspecified: I49.9

## 2016-11-16 ENCOUNTER — Encounter: Payer: Self-pay | Admitting: Emergency Medicine

## 2016-11-16 ENCOUNTER — Other Ambulatory Visit: Payer: Self-pay | Admitting: Radiology

## 2016-11-16 ENCOUNTER — Encounter: Payer: Self-pay | Admitting: Urology

## 2016-11-16 ENCOUNTER — Telehealth: Payer: Self-pay | Admitting: Radiology

## 2016-11-16 ENCOUNTER — Ambulatory Visit (INDEPENDENT_AMBULATORY_CARE_PROVIDER_SITE_OTHER): Payer: PPO | Admitting: Urology

## 2016-11-16 ENCOUNTER — Emergency Department: Payer: PPO

## 2016-11-16 ENCOUNTER — Emergency Department
Admission: EM | Admit: 2016-11-16 | Discharge: 2016-11-16 | Disposition: A | Discharge status: Home / Self Care | Payer: PPO | Attending: Emergency Medicine | Admitting: Emergency Medicine

## 2016-11-16 VITALS — BP 130/78 | HR 68 | Ht 60.0 in | Wt 186.0 lb

## 2016-11-16 DIAGNOSIS — N132 Hydronephrosis with renal and ureteral calculous obstruction: Secondary | ICD-10-CM | POA: Diagnosis not present

## 2016-11-16 DIAGNOSIS — I10 Essential (primary) hypertension: Secondary | ICD-10-CM | POA: Insufficient documentation

## 2016-11-16 DIAGNOSIS — Z79899 Other long term (current) drug therapy: Secondary | ICD-10-CM | POA: Diagnosis not present

## 2016-11-16 DIAGNOSIS — R1032 Left lower quadrant pain: Secondary | ICD-10-CM

## 2016-11-16 DIAGNOSIS — N390 Urinary tract infection, site not specified: Secondary | ICD-10-CM | POA: Insufficient documentation

## 2016-11-16 DIAGNOSIS — N200 Calculus of kidney: Secondary | ICD-10-CM | POA: Insufficient documentation

## 2016-11-16 DIAGNOSIS — N201 Calculus of ureter: Secondary | ICD-10-CM

## 2016-11-16 LAB — URINALYSIS, COMPLETE (UACMP) WITH MICROSCOPIC
BACTERIA UA: NONE SEEN
BILIRUBIN URINE: NEGATIVE
Glucose, UA: NEGATIVE mg/dL
KETONES UR: NEGATIVE mg/dL
NITRITE: NEGATIVE
PROTEIN: NEGATIVE mg/dL
SPECIFIC GRAVITY, URINE: 1.017 (ref 1.005–1.030)
pH: 6 (ref 5.0–8.0)

## 2016-11-16 LAB — COMPREHENSIVE METABOLIC PANEL
ALBUMIN: 3.9 g/dL (ref 3.5–5.0)
ALT: 19 U/L (ref 14–54)
ANION GAP: 7 (ref 5–15)
AST: 24 U/L (ref 15–41)
Alkaline Phosphatase: 77 U/L (ref 38–126)
BILIRUBIN TOTAL: 0.7 mg/dL (ref 0.3–1.2)
BUN: 30 mg/dL — ABNORMAL HIGH (ref 6–20)
CO2: 25 mmol/L (ref 22–32)
Calcium: 9.5 mg/dL (ref 8.9–10.3)
Chloride: 108 mmol/L (ref 101–111)
Creatinine, Ser: 1.08 mg/dL — ABNORMAL HIGH (ref 0.44–1.00)
GFR calc non Af Amer: 53 mL/min — ABNORMAL LOW (ref >60)
GLUCOSE: 121 mg/dL — AB (ref 65–99)
POTASSIUM: 4.1 mmol/L (ref 3.5–5.1)
SODIUM: 140 mmol/L (ref 135–145)
TOTAL PROTEIN: 7.1 g/dL (ref 6.5–8.1)

## 2016-11-16 LAB — CBC
HEMATOCRIT: 41 % (ref 35.0–47.0)
HEMOGLOBIN: 14.6 g/dL (ref 12.0–16.0)
MCH: 31.6 pg (ref 26.0–34.0)
MCHC: 35.6 g/dL (ref 32.0–36.0)
MCV: 88.8 fL (ref 80.0–100.0)
Platelets: 210 10*3/uL (ref 150–440)
RBC: 4.61 MIL/uL (ref 3.80–5.20)
RDW: 12.3 % (ref 11.5–14.5)
WBC: 8.2 10*3/uL (ref 3.6–11.0)

## 2016-11-16 LAB — LIPASE, BLOOD: Lipase: 16 U/L (ref 11–51)

## 2016-11-16 LAB — URINALYSIS, COMPLETE
Bilirubin, UA: NEGATIVE
GLUCOSE, UA: NEGATIVE
KETONES UA: NEGATIVE
LEUKOCYTES UA: NEGATIVE
Nitrite, UA: NEGATIVE
PROTEIN UA: NEGATIVE
SPEC GRAV UA: 1.02 (ref 1.005–1.030)
Urobilinogen, Ur: 0.2 mg/dL (ref 0.2–1.0)
pH, UA: 6 (ref 5.0–7.5)

## 2016-11-16 LAB — MICROSCOPIC EXAMINATION
Bacteria, UA: NONE SEEN
RBC, UA: NONE SEEN /hpf (ref 0–?)

## 2016-11-16 LAB — TROPONIN I

## 2016-11-16 MED ORDER — CIPROFLOXACIN HCL 500 MG PO TABS: 500.0000 mg | ORAL_TABLET | Freq: Two times a day (BID) | ORAL | 0 refills | Status: DC

## 2016-11-16 MED ORDER — TAMSULOSIN HCL 0.4 MG PO CAPS: 0.4000 mg | ORAL_CAPSULE | Freq: Every day | ORAL | 0 refills | Status: DC

## 2016-11-16 MED ORDER — MORPHINE SULFATE (PF) 4 MG/ML IV SOLN
2.0000 mg | Freq: Once | INTRAVENOUS | Status: AC
  Administered 2016-11-16: 2 mg via INTRAVENOUS
  Filled 2016-11-16: qty 1

## 2016-11-16 MED ORDER — IOPAMIDOL (ISOVUE-300) INJECTION 61%
30.0000 mL | Freq: Once | INTRAVENOUS | Status: AC | PRN
  Administered 2016-11-16: 30 mL via ORAL

## 2016-11-16 MED ORDER — HYDROCODONE-ACETAMINOPHEN 5-325 MG PO TABS: 1.0000 | ORAL_TABLET | Freq: Four times a day (QID) | ORAL | 0 refills | Status: DC | PRN

## 2016-11-16 MED ORDER — IOPAMIDOL (ISOVUE-300) INJECTION 61%
100.0000 mL | Freq: Once | INTRAVENOUS | Status: AC | PRN
  Administered 2016-11-16: 100 mL via INTRAVENOUS

## 2016-11-16 MED ORDER — ONDANSETRON 4 MG PO TBDP: 4.0000 mg | ORAL_TABLET | Freq: Three times a day (TID) | ORAL | 0 refills | Status: DC | PRN

## 2016-11-16 MED ORDER — KETOROLAC TROMETHAMINE 30 MG/ML IJ SOLN
30.0000 mg | Freq: Once | INTRAMUSCULAR | Status: AC
  Administered 2016-11-16: 30 mg via INTRAVENOUS
  Filled 2016-11-16: qty 1

## 2016-11-16 MED ORDER — CIPROFLOXACIN HCL 500 MG PO TABS
500.0000 mg | ORAL_TABLET | Freq: Once | ORAL | Status: AC
  Administered 2016-11-16: 500 mg via ORAL
  Filled 2016-11-16: qty 1

## 2016-11-16 MED ORDER — ONDANSETRON HCL 4 MG/2ML IJ SOLN
4.0000 mg | Freq: Once | INTRAMUSCULAR | Status: AC
  Administered 2016-11-16: 4 mg via INTRAVENOUS
  Filled 2016-11-16: qty 2

## 2016-11-16 NOTE — Progress Notes (Signed)
11/16/2016 3:20 PM   Julia Cooke Julia Cooke 05-01-1951 [id number]  Referring provider: Crecencio Mc, MD [place], [place] [address]  Chief Complaint  Patient presents with  . Nephrolithiasis    HPI: Julia Cooke is a 65yo seen for ureteral calculus. She developed severe, sharp, constant nonradiaiting left flank pain. She has associated nausea but no vomiting. She denies any LUTS. No hematuria. She is not on any blood thinners. This is her second stone event. Last event was 14 years ago treated with laser lithotripsy.   Ct scan shows an 88m UVJ calculus with moderate hydronephrosis.   UA is not concerning for infection.    PMH: Past Medical History:  Diagnosis Date  . Bipolar disorder ([place]   . Depression   . Heart murmur   . Hypertension   . Nephrolithiasis 2000   s/p lithotripsy  . Psychotic disorder    mania    Surgical History: Past Surgical History:  Procedure Laterality Date  . LITHOTRIPSY  2000    Home Medications:  Allergies as of 11/16/2016      Reactions   Penicillins Swelling   As a child      Medication List       Accurate as of 11/16/16  3:20 PM. Always use your most recent med list.          ARIPiprazole 2 MG tablet Commonly known as:  ABILIFY Take 1 tablet (2 mg total) by mouth daily.   ciprofloxacin 500 MG tablet Commonly known as:  CIPRO Take 1 tablet (500 mg total) by mouth 2 (two) times daily.   HYDROcodone-acetaminophen 5-325 MG tablet Commonly known as:  NORCO Take 1 tablet by mouth every 6 (six) hours as needed for moderate pain.   metoprolol succinate 25 MG 24 hr tablet Commonly known as:  TOPROL-XL TAKE 1 TABLET (25 MG TOTAL) BY MOUTH DAILY.   multivitamin tablet Take 1 tablet by mouth daily.   NON FORMULARY Take 1 capsule by mouth daily. Digestive enzymes   ondansetron 4 MG disintegrating tablet Commonly known as:  ZOFRAN ODT Take 1 tablet (4 mg total) by mouth every 8 (eight) hours as needed for  nausea or vomiting.       Allergies:  Allergies  Allergen Reactions  . Penicillins Swelling    As a child    Family History: Family History  Problem Relation Age of Onset  . Hyperlipidemia Mother   . Stroke Mother   . Hypertension Mother   . Hyperlipidemia Father   . Hypertension Father   . Cancer Sister     breast  . Heart disease Brother     myocardial infarction  . Heart attack Brother   . Hypertension Brother   . Fibromyalgia Sister   . Hypertension Sister   . Heart Problems Sister   . Hypertension Sister     Social History:  reports that she has never smoked. She has never used smokeless tobacco. She reports that she drinks alcohol. She reports that she does not use drugs.  ROS: UROLOGY Frequent Urination?: Yes Hard to postpone urination?: Yes Burning/pain with urination?: No Get up at night to urinate?: Yes Leakage of urine?: Yes Urine stream starts and stops?: Yes Trouble starting stream?: Yes Do you have to strain to urinate?: No Blood in urine?: No Urinary tract infection?: Yes Sexually transmitted disease?: No Injury to kidneys or bladder?: No Painful intercourse?: No Weak stream?: Yes Currently pregnant?: No Vaginal bleeding?: No Last menstrual  period?: n  Gastrointestinal Nausea?: Yes Vomiting?: No Indigestion/heartburn?: No Diarrhea?: Yes Constipation?: No  Constitutional Fever: No Night sweats?: No Weight loss?: No Fatigue?: Yes  Skin Skin rash/lesions?: No Itching?: No  Eyes Blurred vision?: No Double vision?: No  Ears/Nose/Throat Sore throat?: No Sinus problems?: No  Hematologic/Lymphatic Swollen glands?: No Easy bruising?: Yes  Cardiovascular Leg swelling?: Yes Chest pain?: No  Respiratory Cough?: Yes Shortness of breath?: No  Endocrine Excessive thirst?: No  Musculoskeletal Back pain?: No Joint pain?: Yes  Neurological Headaches?: No Dizziness?: No  Psychologic Depression?: No Anxiety?:  Yes  Physical Exam: BP 130/78   Pulse 68   Ht 5' (1.524 m)   Wt 84.4 kg (186 lb)   BMI 36.33 kg/m   Constitutional:  Alert and oriented, No acute distress. HEENT: [place] AT, moist mucus membranes.  Trachea midline, no masses. Cardiovascular: No clubbing, cyanosis, or edema. Respiratory: Normal respiratory effort, no increased work of breathing. GI: Abdomen is soft, nontender, nondistended, no abdominal masses GU: No CVA tenderness.  Skin: No rashes, bruises or suspicious lesions. Lymph: No cervical or inguinal adenopathy. Neurologic: Grossly intact, no focal deficits, moving all 4 extremities. Psychiatric: Normal mood and affect.  Laboratory Data: Lab Results  Component Value Date   WBC 8.2 11/16/2016   HGB 14.6 11/16/2016   HCT 41.0 11/16/2016   MCV 88.8 11/16/2016   PLT 210 11/16/2016    Lab Results  Component Value Date   CREATININE 1.08 (H) 11/16/2016    No results found for: PSA  No results found for: TESTOSTERONE  Lab Results  Component Value Date   HGBA1C 4.8 04/10/2014    Urinalysis    Component Value Date/Time   COLORURINE YELLOW (A) 11/16/2016 0340   APPEARANCEUR CLEAR (A) 11/16/2016 0340   APPEARANCEUR Clear 02/02/2013 1331   LABSPEC 1.017 11/16/2016 0340   LABSPEC 1.003 02/02/2013 1331   PHURINE 6.0 11/16/2016 0340   GLUCOSEU NEGATIVE 11/16/2016 0340   GLUCOSEU NEGATIVE 11/18/2014 1135   HGBUR SMALL (A) 11/16/2016 0340   BILIRUBINUR NEGATIVE 11/16/2016 0340   BILIRUBINUR neg 11/18/2014 1134   BILIRUBINUR Negative 02/02/2013 1331   KETONESUR NEGATIVE 11/16/2016 0340   PROTEINUR NEGATIVE 11/16/2016 0340   UROBILINOGEN 0.2 11/18/2014 1135   NITRITE NEGATIVE 11/16/2016 0340   LEUKOCYTESUR MODERATE (A) 11/16/2016 0340   LEUKOCYTESUR 1+ 02/02/2013 1331    Pertinent Imaging: CT abd/pelvis  Assessment & Plan:   1. Nephrolithiasis -We discussed MET versus ESWL versus URS and the patient has elected for ESWL - Urinalysis, Complete - CULTURE,  URINE COMPREHENSIVE   No Follow-up on file.  Nicolette Bang, MD  [facility] [address] [place], [place] [address] [phone]

## 2016-11-16 NOTE — Telephone Encounter (Signed)
-----   Message from Hollice Espy, MD sent at 11/16/2016  8:45 AM EST ----- Regarding: FW: Follow-up for 29mm stone patient discussed via telephone 12/19 This patient need to be worked in today to any provider to get set up for lithotripsy Thursday AM.  If no spots available, double book with me as last resort.    Hollice Espy, MD  ----- Message ----- From: Ardis Hughs, MD Sent: 11/16/2016   7:59 AM To: Hollice Espy, MD Subject: FW: Follow-up for 59mm stone patient discusse#  Litho patient ----- Message ----- From: Paulette Blanch, MD Sent: 11/16/2016   6:00 AM To: Ardis Hughs, MD Subject: Follow-up for 19mm stone patient discussed vi#

## 2016-11-16 NOTE — ED Triage Notes (Signed)
Pt ambulatory to triage with steady gait with c/o LLQ abdominal pain beginning at midnight tonight. Pt reports similar pain occurred on Friday but symptoms resolved. Pt denies n/v/d, chest pain, or urinary symptoms. Pt took tylenol 1 hour PTA with no relief. Pt alert and oriented x 4, no increased work in breathing noted.

## 2016-11-16 NOTE — ED Provider Notes (Signed)
[facility] Emergency Department Provider Note   ____________________________________________   First MD Initiated Contact with Patient 11/16/16 [phone]     (approximate)  I have reviewed the triage vital signs and the nursing notes.   HISTORY  Chief Complaint Abdominal Pain    HPI Julia Cooke is a 66 y.o. female who presents to the ED from home with a chief complain of abdominal pain. Patient reports left lower quadrant abdominal pain 4 days ago. Symptoms resolved the following day so she went to exercise class. Pain returned this morning, awakening patient from sleep. Describes dull, nonradiating, constant pain in her left lower quadrant. Has had a history of kidney stones but states this does not feel the same. Denies associated fever, chills, chest pain, shortness of breath, nausea, vomiting, dysuria, diarrhea. Denies recent travel or trauma. Nothing makes her symptoms worse. Patient took Tylenol one hour prior to arrival and states that has brought her pain down from 7/10 to 4/10.   Past Medical History:  Diagnosis Date  . Bipolar disorder ([place])   . Depression   . Heart murmur   . Hypertension   . Nephrolithiasis 2000   s/p lithotripsy  . Psychotic disorder    mania    Patient Active Problem List   Diagnosis Date Noted  . Poison ivy dermatitis 08/19/2016  . Vitamin D deficiency 12/27/2015  . Obesity 12/27/2015  . Hyperlipidemia LDL goal <100 12/27/2015  . Bipolar disorder ([place]) 11/18/2014  . Impaired fasting glucose 04/08/2014  . Dyspareunia 01/28/2014  . Vaginal dryness, menopausal 01/28/2014  . Fatigue 01/07/2014  . Decreased libido 01/07/2014  . Medication management 01/07/2014  . Right knee pain 05/02/2013  . HTN (hypertension) 04/04/2013  . Depression 04/04/2013  . Mania ([place]) 04/04/2013    Past Surgical History:  Procedure Laterality Date  . LITHOTRIPSY  2000    Prior to Admission medications   Medication Sig Start Date  End Date Taking? Authorizing Provider  ARIPiprazole (ABILIFY) 2 MG tablet Take 1 tablet (2 mg total) by mouth daily. 10/05/16 10/05/17  Himabindu Ravi, MD  ciprofloxacin (CIPRO) 500 MG tablet Take 1 tablet (500 mg total) by mouth 2 (two) times daily. 11/16/16   Paulette Blanch, MD  HYDROcodone-acetaminophen (NORCO) 5-325 MG tablet Take 1 tablet by mouth every 6 (six) hours as needed for moderate pain. 11/16/16   Paulette Blanch, MD  metoprolol succinate (TOPROL-XL) 25 MG 24 hr tablet TAKE 1 TABLET (25 MG TOTAL) BY MOUTH DAILY. 10/19/16   Crecencio Mc, MD  Multiple Vitamin (MULTIVITAMIN) tablet Take 1 tablet by mouth daily.    Historical Provider, MD  NON FORMULARY Take 1 capsule by mouth daily. Digestive enzymes    Historical Provider, MD  ondansetron (ZOFRAN ODT) 4 MG disintegrating tablet Take 1 tablet (4 mg total) by mouth every 8 (eight) hours as needed for nausea or vomiting. 11/16/16   Paulette Blanch, MD    Allergies Penicillins  Family History  Problem Relation Age of Onset  . Hyperlipidemia Mother   . Stroke Mother   . Hypertension Mother   . Hyperlipidemia Father   . Hypertension Father   . Cancer Sister     breast  . Heart disease Brother     myocardial infarction  . Heart attack Brother   . Hypertension Brother   . Fibromyalgia Sister   . Hypertension Sister   . Heart Problems Sister   . Hypertension Sister     Social History Social  History  Substance Use Topics  . Smoking status: Never Smoker  . Smokeless tobacco: Never Used  . Alcohol use 0.0 - 1.2 oz/week     Comment: 2-3 times weekly    Review of Systems  Constitutional: No fever/chills. Eyes: No visual changes. ENT: No sore throat. Cardiovascular: Denies chest pain. Respiratory: Denies shortness of breath. Gastrointestinal: Positive for left lower quadrant abdominal pain.  No nausea, no vomiting.  No diarrhea.  No constipation. Genitourinary: Negative for dysuria. Musculoskeletal: Negative for back pain. Skin:  Negative for rash. Neurological: Negative for headaches, focal weakness or numbness.  10-point ROS otherwise negative.  ____________________________________________   PHYSICAL EXAM:  VITAL SIGNS: ED Triage Vitals  Enc Vitals Group     BP 11/16/16 0327 (!) 174/81     Pulse Rate 11/16/16 0327 87     Resp 11/16/16 0327 20     Temp 11/16/16 0327 98.3 F (36.8 C)     Temp Source 11/16/16 0327 Oral     SpO2 11/16/16 0327 94 %     Weight 11/16/16 0328 182 lb (82.6 kg)     Height 11/16/16 0328 5' (1.524 m)     Head Circumference --      Peak Flow --      Pain Score 11/16/16 0328 8     Pain Loc --      Pain Edu? --      Excl. in [place]? --     Constitutional: Alert and oriented. Well appearing and in no acute distress. Eyes: Conjunctivae are normal. PERRL. EOMI. Head: Atraumatic. Nose: No congestion/rhinnorhea. Mouth/Throat: Mucous membranes are moist.  Oropharynx non-erythematous. Neck: No stridor.   Cardiovascular: Normal rate, regular rhythm. Grossly normal heart sounds.  Good peripheral circulation. Respiratory: Normal respiratory effort.  No retractions. Lungs CTAB. Gastrointestinal: Soft and mildly tender to palpation left lower quadrant without rebound or guarding. No distention. No abdominal bruits. No CVA tenderness. Musculoskeletal: No lower extremity tenderness nor edema.  No joint effusions. Neurologic:  Normal speech and language. No gross focal neurologic deficits are appreciated. No gait instability. Skin:  Skin is warm, dry and intact. No rash noted. Psychiatric: Mood and affect are normal. Speech and behavior are normal.  ____________________________________________   LABS (all labs ordered are listed, but only abnormal results are displayed)  Labs Reviewed  COMPREHENSIVE METABOLIC PANEL - Abnormal; Notable for the following:       Result Value   Glucose, Bld 121 (*)    BUN 30 (*)    Creatinine, Ser 1.08 (*)    GFR calc non Af Amer 53 (*)    All other  components within normal limits  URINALYSIS, COMPLETE (UACMP) WITH MICROSCOPIC - Abnormal; Notable for the following:    Color, Urine YELLOW (*)    APPearance CLEAR (*)    Hgb urine dipstick SMALL (*)    Leukocytes, UA MODERATE (*)    Squamous Epithelial / LPF 0-5 (*)    All other components within normal limits  LIPASE, BLOOD  CBC  TROPONIN I   ____________________________________________  EKG  ED ECG REPORT I, SUNG,JADE J, the attending physician, personally viewed and interpreted this ECG.   Date: 11/16/2016  EKG Time: 0335  Rate: 84  Rhythm: normal EKG, normal sinus rhythm, LBBB  Axis: LAD  Intervals:left bundle branch block  ST&T Change: Nonspecific No old EKG available for comparison ____________________________________________  RADIOLOGY  CT abdomen and pelvis with contrast interpreted per Dr. Quintella Reichert: An 8 mm left UVJ stone with  mild left hydronephrosis. Correlation  with urinalysis recommended to exclude superimposed UTI. Multiple  smaller and nonobstructing left renal calculi as well as tiny right  renal stone noted. No hydronephrosis on the right.    Sigmoid diverticulosis. No evidence of bowel obstruction or active  inflammation. Normal appendix.   ____________________________________________   PROCEDURES  Procedure(s) performed: None  Procedures  Critical Care performed: No  ____________________________________________   INITIAL IMPRESSION / ASSESSMENT AND PLAN / ED COURSE  Pertinent labs & imaging results that were available during my care of the patient were reviewed by me and considered in my medical decision making (see chart for details).  65 year old female who presents with left lower quadrant abdominal pain, initially 4 days ago and returning this morning. She is already feeling better after Tylenol taken prior to arrival. Initial laboratory results are unremarkable, including negative troponin. EKG does demonstrate left bundle branch  block without old EKG for comparison; however, patient denies chest pain, diaphoresis, shortness of breath and her pain is in her left lower quadrant so I have low suspicion for ACS. Will administer IV analgesia and proceed to CT abdomen/pelvis to evaluate for intra-abdominal etiology.  Clinical Course as of Nov 16 599  Tue Nov 16, 2016  0538 Updated patient and spouse on laboratory, urinalysis and CT imaging results. She is pain-free after Toradol. I discussed the case with Dr. Louis Meckel from urology. As the patient is afebrile with normal white count, not vomiting with good renal function, will discharge home on antibiotic, analgesia and she will follow up closely with urology in the next 1-2 days for possible lithotripsy. Strict return precautions given. Both verbalize understanding and agree with plan of care.  [JS]    Clinical Course User Index [JS] Paulette Blanch, MD     ____________________________________________   FINAL CLINICAL IMPRESSION(S) / ED DIAGNOSES  Final diagnoses:  Left lower quadrant pain  Kidney stone  Urinary tract infection without hematuria, site unspecified      NEW MEDICATIONS STARTED DURING THIS VISIT:  New Prescriptions   CIPROFLOXACIN (CIPRO) 500 MG TABLET    Take 1 tablet (500 mg total) by mouth 2 (two) times daily.   HYDROCODONE-ACETAMINOPHEN (NORCO) 5-325 MG TABLET    Take 1 tablet by mouth every 6 (six) hours as needed for moderate pain.   ONDANSETRON (ZOFRAN ODT) 4 MG DISINTEGRATING TABLET    Take 1 tablet (4 mg total) by mouth every 8 (eight) hours as needed for nausea or vomiting.     Note:  This document was prepared using Dragon voice recognition software and may include unintentional dictation errors.    Paulette Blanch, MD 11/16/16 [phone]

## 2016-11-16 NOTE — Discharge Instructions (Signed)
1. Take antibiotic as prescribed (Cipro 500 mg twice daily 7 days). 2. You may take pain and nausea medicines as needed (Norco/Zofran #30). 3. Avoid aspirin and ibuprofen products in the event you have lithotripsy on Thursday. 4. Return to the ER for worsening symptoms, persistent vomiting, fever, difficulty breathing or other concerns.

## 2016-11-16 NOTE — Telephone Encounter (Signed)
LMOM

## 2016-11-17 ENCOUNTER — Encounter: Payer: Self-pay | Admitting: *Deleted

## 2016-11-18 ENCOUNTER — Ambulatory Visit
Admission: RE | Admit: 2016-11-18 | Discharge: 2016-11-18 | Disposition: A | Discharge status: Home / Self Care | Payer: PPO | Source: Ambulatory Visit | Attending: Urology | Admitting: Urology

## 2016-11-18 ENCOUNTER — Other Ambulatory Visit: Payer: Self-pay | Admitting: Radiology

## 2016-11-18 ENCOUNTER — Encounter
Admission: RE | Disposition: A | Discharge status: Home / Self Care | Payer: Self-pay | Source: Ambulatory Visit | Attending: Urology

## 2016-11-18 ENCOUNTER — Telehealth: Payer: Self-pay | Admitting: Radiology

## 2016-11-18 DIAGNOSIS — Z539 Procedure and treatment not carried out, unspecified reason: Secondary | ICD-10-CM | POA: Diagnosis not present

## 2016-11-18 DIAGNOSIS — N201 Calculus of ureter: Secondary | ICD-10-CM

## 2016-11-18 HISTORY — DX: Cardiac arrhythmia, unspecified: I49.9

## 2016-11-18 SURGERY — LITHOTRIPSY, ESWL
Anesthesia: Moderate Sedation | Laterality: Left

## 2016-11-18 MED ORDER — CIPROFLOXACIN HCL 500 MG PO TABS: 500.0000 mg | ORAL_TABLET | ORAL | Status: DC

## 2016-11-18 MED ORDER — DEXTROSE-NACL 5-0.45 % IV SOLN: INTRAVENOUS | Status: DC

## 2016-11-18 MED ORDER — DIPHENHYDRAMINE HCL 25 MG PO CAPS: 25.0000 mg | ORAL_CAPSULE | ORAL | Status: AC

## 2016-11-18 MED ORDER — DIAZEPAM 5 MG PO TABS: 10.0000 mg | ORAL_TABLET | ORAL | Status: AC

## 2016-11-18 NOTE — Telephone Encounter (Signed)
Pt called to reschedule ESWL d/t procedure being cancelled on 11/18/16 due to abnormal EKG. Per Dr Erlene Quan pt will need to be cleared by cardiologist prior to being rescheduled. Per pt request, appt made with Dr Yvone Neu at [facility] in [place] on 12/07/16 @1 :00. Pt aware. Will contact patient after clearance is obtained to reschedule procedure. Pt voices understanding.

## 2016-11-18 NOTE — OR Nursing (Signed)
Dr Erlene Quan in to talk to patient - case cancelled due to ekg abnormal - pt needs cardiac workup- Dr Erlene Quan will get office to work with patient to get appt with cardiologist

## 2016-11-19 LAB — CULTURE, URINE COMPREHENSIVE

## 2016-12-01 ENCOUNTER — Ambulatory Visit: Payer: Self-pay | Admitting: Urology

## 2016-12-02 ENCOUNTER — Telehealth: Payer: Self-pay | Admitting: Cardiology

## 2016-12-02 NOTE — Telephone Encounter (Signed)
Cardiac clearance received for patient to have extracorporeal shockwave lithotripsy with Dr. Hollice Espy at [facility]. Clearance needs to be faxed to [phone] attention to Amy and she can be reached at [phone] if there are any questions. Original clearance form placed in red folder in "To Do" bin on Kavi Almquist's desk.

## 2016-12-07 ENCOUNTER — Ambulatory Visit (INDEPENDENT_AMBULATORY_CARE_PROVIDER_SITE_OTHER): Payer: PPO | Admitting: Cardiology

## 2016-12-07 ENCOUNTER — Encounter: Payer: Self-pay | Admitting: Cardiology

## 2016-12-07 VITALS — BP 130/80 | HR 84 | Ht 60.0 in | Wt 179.0 lb

## 2016-12-07 DIAGNOSIS — R0602 Shortness of breath: Secondary | ICD-10-CM | POA: Diagnosis not present

## 2016-12-07 DIAGNOSIS — Z6834 Body mass index (BMI) 34.0-34.9, adult: Secondary | ICD-10-CM

## 2016-12-07 DIAGNOSIS — E6609 Other obesity due to excess calories: Secondary | ICD-10-CM

## 2016-12-07 DIAGNOSIS — Z01818 Encounter for other preprocedural examination: Secondary | ICD-10-CM | POA: Insufficient documentation

## 2016-12-07 DIAGNOSIS — I10 Essential (primary) hypertension: Secondary | ICD-10-CM

## 2016-12-07 DIAGNOSIS — I447 Left bundle-branch block, unspecified: Secondary | ICD-10-CM | POA: Diagnosis not present

## 2016-12-07 NOTE — Patient Instructions (Addendum)
Testing/Procedures: Your physician has requested that you have an echocardiogram. Echocardiography is a painless test that uses sound waves to create images of your heart. It provides your doctor with information about the size and shape of your heart and how well your heart's chambers and valves are working. This procedure takes approximately one hour. There are no restrictions for this procedure.  [place]  Your caregiver has ordered a Stress Test with nuclear imaging. The purpose of this test is to evaluate the blood supply to your heart muscle. This procedure is referred to as a "Non-Invasive Stress Test." This is because other than having an IV started in your vein, nothing is inserted or "invades" your body. Cardiac stress tests are done to find areas of poor blood flow to the heart by determining the extent of coronary artery disease (CAD). Some patients exercise on a treadmill, which naturally increases the blood flow to your heart, while others who are  unable to walk on a treadmill due to physical limitations have a pharmacologic/chemical stress agent called Lexiscan . This medicine will mimic walking on a treadmill by temporarily increasing your coronary blood flow.   Please note: these test may take anywhere between 2-4 hours to complete  PLEASE REPORT TO [place] AT THE FIRST DESK WILL DIRECT YOU WHERE TO GO  Date of Procedure:_Thursday December 16, 2016 at 08:00AM___  Arrival Time for Procedure:_Arrive at 07:45AM________  Instructions regarding medication:   __X__:  Hold metoprolol the night before procedure and morning of procedure   PLEASE NOTIFY THE OFFICE AT LEAST 24 HOURS IN ADVANCE IF YOU ARE UNABLE TO KEEP YOUR APPOINTMENT.  [phone] AND  PLEASE NOTIFY NUCLEAR MEDICINE AT [facility] AT LEAST 24 HOURS IN ADVANCE IF YOU ARE UNABLE TO KEEP YOUR APPOINTMENT. [phone]  How to prepare for your Myoview test:  1. Do not eat or drink after  midnight 2. No caffeine for 24 hours prior to test 3. No smoking 24 hours prior to test. 4. Your medication may be taken with water.  If your doctor stopped a medication because of this test, do not take that medication. 5. Ladies, please do not wear dresses.  Skirts or pants are appropriate. Please wear a short sleeve shirt. 6. No perfume, cologne or lotion. 7. Wear comfortable walking shoes. No heels!    Follow-Up: Your physician recommends that you schedule a follow-up appointment as needed with Dr. Yvone Neu. We will call you with results and if needed schedule follow up at that time.  It was a pleasure seeing you today here in the office. Please do not hesitate to give [place] a call back if you have any further questions. [place], BSN       Echocardiogram An echocardiogram, or echocardiography, uses sound waves (ultrasound) to produce an image of your heart. The echocardiogram is simple, painless, obtained within a short period of time, and offers valuable information to your health care provider. The images from an echocardiogram can provide information such as:  Evidence of coronary artery disease (CAD).  Heart size.  Heart muscle function.  Heart valve function.  Aneurysm detection.  Evidence of a past heart attack.  Fluid buildup around the heart.  Heart muscle thickening.  Assess heart valve function. Tell a health care provider about:  Any allergies you have.  All medicines you are taking, including vitamins, herbs, eye drops, creams, and over-the-counter medicines.  Any problems you or family members have had  with anesthetic medicines.  Any blood disorders you have.  Any surgeries you have had.  Any medical conditions you have.  Whether you are pregnant or may be pregnant. What happens before the procedure? No special preparation is needed. Eat and drink normally. What happens during the procedure?  In order to produce an image of  your heart, gel will be applied to your chest and a wand-like tool (transducer) will be moved over your chest. The gel will help transmit the sound waves from the transducer. The sound waves will harmlessly bounce off your heart to allow the heart images to be captured in real-time motion. These images will then be recorded.  You may need an IV to receive a medicine that improves the quality of the pictures. What happens after the procedure? You may return to your normal schedule including diet, activities, and medicines, unless your health care provider tells you otherwise. This information is not intended to replace advice given to you by your health care provider. Make sure you discuss any questions you have with your health care provider. Document Released: 11/12/2000 Document Revised: 07/03/2016 Document Reviewed: 07/23/2013 Elsevier Interactive Patient Education  2017 [employer]. Pharmacologic Stress Electrocardiogram A pharmacologic stress electrocardiogram is a heart (cardiac) test that uses nuclear imaging to evaluate the blood supply to your heart. This test may also be called a pharmacologic stress electrocardiography. Pharmacologic means that a medicine is used to increase your heart rate and blood pressure.  This stress test is done to find areas of poor blood flow to the heart by determining the extent of coronary artery disease (CAD). Some people exercise on a treadmill, which naturally increases the blood flow to the heart. For those people unable to exercise on a treadmill, a medicine is used. This medicine stimulates your heart and will cause your heart to beat harder and more quickly, as if you were exercising.  Pharmacologic stress tests can help determine:  The adequacy of blood flow to your heart during increased levels of activity in order to clear you for discharge home.  The extent of coronary artery blockage caused by CAD.  Your prognosis if you have suffered a heart  attack.  The effectiveness of cardiac procedures done, such as an angioplasty, which can increase the circulation in your coronary arteries.  Causes of chest pain or pressure. LET [facility] CARE PROVIDER KNOW ABOUT:  Any allergies you have.  All medicines you are taking, including vitamins, herbs, eye drops, creams, and over-the-counter medicines.  Previous problems you or members of your family have had with the use of anesthetics.  Any blood disorders you have.  Previous surgeries you have had.  Medical conditions you have.  Possibility of pregnancy, if this applies.  If you are currently breastfeeding. RISKS AND COMPLICATIONS Generally, this is a safe procedure. However, as with any procedure, complications can occur. Possible complications include:  You develop pain or pressure in the following areas:  Chest.  Jaw or neck.  Between your shoulder blades.  Radiating down your left arm.  Headache.  Dizziness or light-headedness.  Shortness of breath.  Increased or irregular heartbeat.  Low blood pressure.  Nausea or vomiting.  Flushing.  Redness going up the arm and slight pain during injection of medicine.  Heart attack (rare). BEFORE THE PROCEDURE   Avoid all forms of caffeine for 24 hours before your test or as directed by your health care provider. This includes coffee, tea (even decaffeinated tea), caffeinated sodas, chocolate, cocoa,  and certain pain medicines.  Follow your health care provider's instructions regarding eating and drinking before the test.  Take your medicines as directed at regular times with water unless instructed otherwise. Exceptions may include:  If you have diabetes, ask how you are to take your insulin or pills. It is common to adjust insulin dosing the morning of the test.  If you are taking beta-blocker medicines, it is important to talk to your health care provider about these medicines well before the date of your  test. Taking beta-blocker medicines may interfere with the test. In some cases, these medicines need to be changed or stopped 24 hours or more before the test.  If you wear a nitroglycerin patch, it may need to be removed prior to the test. Ask your health care provider if the patch should be removed before the test.  If you use an inhaler for any breathing condition, bring it with you to the test.  If you are an outpatient, bring a snack so you can eat right after the stress phase of the test.  Do not smoke for 4 hours prior to the test or as directed by your health care provider.  Do not apply lotions, powders, creams, or oils on your chest prior to the test.  Wear comfortable shoes and clothing. Let your health care provider know if you were unable to complete or follow the preparations for your test. PROCEDURE   Multiple patches (electrodes) will be put on your chest. If needed, small areas of your chest may be shaved to get better contact with the electrodes. Once the electrodes are attached to your body, multiple wires will be attached to the electrodes, and your heart rate will be monitored.  An IV access will be started. A nuclear trace (isotope) is given. The isotope may be given intravenously, or it may be swallowed. Nuclear refers to several types of radioactive isotopes, and the nuclear isotope lights up the arteries so that the nuclear images are clear. The isotope is absorbed by your body. This results in low radiation exposure.  A resting nuclear image is taken to show how your heart functions at rest.  A medicine is given through the IV access.  A second scan is done about 1 hour after the medicine injection and determines how your heart functions under stress.  During this stress phase, you will be connected to an electrocardiogram machine. Your blood pressure and oxygen levels will be monitored. AFTER THE PROCEDURE   Your heart rate and blood pressure will be monitored  after the test.  You may return to your normal schedule, including diet,activities, and medicines, unless your health care provider tells you otherwise. This information is not intended to replace advice given to you by your health care provider. Make sure you discuss any questions you have with your health care provider. Document Released: 04/03/2009 Document Revised: 11/20/2013 Document Reviewed: 07/23/2013 Elsevier Interactive Patient Education  2017 [employer].

## 2016-12-07 NOTE — Progress Notes (Signed)
Cardiology Office Note   Date:  12/07/2016   ID:  Julia Cooke, DOB [birth year], MRN [id number]  Referring Doctor:  Crecencio Mc, MD   Cardiologist:   Wende Bushy, MD   Reason for consultation:  Chief Complaint  Patient presents with  . other    LBBB. Meds reviewed verbally with pt.      History of Present Illness: Julia Cooke is a 66 y.o. female who presents for Left bundle branch block, preoperative evaluation prior to lithotripsy  Patient presents for preop prior to lithotripsy was noted to have a left bundle branch block on EKG.  Patient denies chest pain. She does of shortness of breath especially with stairs. Shortness of breath somewhat improved when she started becoming more physically active, joined the Silver sneakers program at [employer], doing aerobics and water exercises. Persistence of shortness of breath with stairs though. As of breath is moderate intensity, symptoms in the chest, on and off, with exertion mainly with stairs, resolves with rest. Going on for quite a few months now.  No lightheadedness. No PND, orthopnea, edema. No loss of consciousness. No palpitations.    ROS:  Please see the history of present illness. Aside from mentioned under HPI, all other systems are reviewed and negative.     Past Medical History:  Diagnosis Date  . Bipolar disorder ([place])    takes Abilify  . Depression   . Dysrhythmia 10/2016   LBBB on ekg...nothing to compare it to.  Marland Kitchen Heart murmur   . Hypertension   . Nephrolithiasis 2000   s/p lithotripsy  . Psychotic disorder    mania    Past Surgical History:  Procedure Laterality Date  . LITHOTRIPSY  2000     reports that she has never smoked. She has never used smokeless tobacco. She reports that she does not drink alcohol or use drugs.   family history includes Cancer in her sister; Fibromyalgia in her sister; Heart Problems in her sister; Heart attack in her brother; Heart disease in her brother;  Hyperlipidemia in her father and mother; Hypertension in her brother, father, mother, sister, and sister; Stroke in her mother.   Outpatient Medications Prior to Visit  Medication Sig Dispense Refill  . ARIPiprazole (ABILIFY) 2 MG tablet Take 1 tablet (2 mg total) by mouth daily. 30 tablet 2  . metoprolol succinate (TOPROL-XL) 25 MG 24 hr tablet TAKE 1 TABLET (25 MG TOTAL) BY MOUTH DAILY. 90 tablet 0  . ciprofloxacin (CIPRO) 500 MG tablet Take 1 tablet (500 mg total) by mouth 2 (two) times daily. (Patient not taking: Reported on 12/07/2016) 14 tablet 0  . HYDROcodone-acetaminophen (NORCO) 5-325 MG tablet Take 1 tablet by mouth every 6 (six) hours as needed for moderate pain. (Patient not taking: Reported on 12/07/2016) 30 tablet 0  . Multiple Vitamin (MULTIVITAMIN) tablet Take 1 tablet by mouth daily.    . NON FORMULARY Take 1 capsule by mouth daily. Digestive enzymes    . ondansetron (ZOFRAN ODT) 4 MG disintegrating tablet Take 1 tablet (4 mg total) by mouth every 8 (eight) hours as needed for nausea or vomiting. (Patient not taking: Reported on 12/07/2016) 30 tablet 0  . tamsulosin (FLOMAX) 0.4 MG CAPS capsule Take 1 capsule (0.4 mg total) by mouth daily after supper. (Patient not taking: Reported on 12/07/2016) 30 capsule 0   No facility-administered medications prior to visit.      Allergies: Penicillins    PHYSICAL EXAM: VS:  BP  130/80 (BP Location: Right Arm, Patient Position: Sitting, Cuff Size: Normal)   Pulse 84   Ht 5' (1.524 m)   Wt 179 lb (81.2 kg)   BMI 34.96 kg/m  , Body mass index is 34.96 kg/m. Wt Readings from Last 3 Encounters:  12/07/16 179 lb (81.2 kg)  11/18/16 186 lb (84.4 kg)  11/16/16 186 lb (84.4 kg)    GENERAL:  well developed, well nourished, obese, not in acute distress HEENT: normocephalic, pink conjunctivae, anicteric sclerae, no xanthelasma, normal dentition, oropharynx clear NECK:  no neck vein engorgement, JVP normal, no hepatojugular reflux, carotid  upstroke brisk and symmetric, no bruit, no thyromegaly, no lymphadenopathy LUNGS:  good respiratory effort, clear to auscultation bilaterally CV:  PMI not displaced, no thrills, no lifts, S1 and S2 within normal limits, no palpable S3 or S4, no murmurs, no rubs, no gallops ABD:  Soft, nontender, nondistended, normoactive bowel sounds, no abdominal aortic bruit, no hepatomegaly, no splenomegaly MS: nontender back, no kyphosis, no scoliosis, no joint deformities EXT:  2+ DP/PT pulses, no edema, no varicosities, no cyanosis, no clubbing SKIN: warm, nondiaphoretic, normal turgor, no ulcers NEUROPSYCH: alert, oriented to person, place, and time, sensory/motor grossly intact, normal mood, appropriate affect  Recent Labs: 12/25/2015: TSH 1.20 11/16/2016: ALT 19; BUN 30; Creatinine, Ser 1.08; Hemoglobin 14.6; Platelets 210; Potassium 4.1; Sodium 140   Lipid Panel    Component Value Date/Time   CHOL 212 (H) 12/25/2015 0929   TRIG 95.0 12/25/2015 0929   HDL 50.80 12/25/2015 0929   CHOLHDL 4 12/25/2015 0929   VLDL 19.0 12/25/2015 0929   LDLCALC 143 (H) 12/25/2015 0929   LDLDIRECT 181.7 01/07/2014 0852     Other studies Reviewed:  EKG:  The ekg from01/07/2016 was personally reviewed by me and it revealed sinus rhythm, left bundle branch block  Additional studies/ records that were reviewed personally reviewed by me today include: None available   ASSESSMENT AND PLAN:  Shortness of breath Left bundle branch block, abnormal EKG Preoperative evaluation Risk factors include age, hypertension, family history, brother died of MI in his 61s. Recommend echocardiogram and pharmacologic nuclear stress test. If echo unremarkable, and no ischemia on stress is, patient will likely be intermediate cardiac risk for moderate risk surgery. Next  Hypertension Recommend BP log. Goal is less than 130/80. Patient follows up with PCP for this  Obesity Body mass index is 34.96 kg/m.Marland Kitchen Recommend aggressive  weight loss through diet and increased physical activity.    Current medicines are reviewed at length with the patient today.  The patient does not have concerns regarding medicines.  Labs/ tests ordered today include:  Orders Placed This Encounter  Procedures  . NM Myocar Multi W/Spect W/Wall Motion / EF  . EKG 12-Lead  . ECHOCARDIOGRAM COMPLETE    I had a lengthy and detailed discussion with the patient regarding diagnoses, prognosis, diagnostic options, treatment options , and side effects of medications.   I counseled the patient on importance of lifestyle modification including heart healthy diet, regular physical activity Once stress this is completed.  I spent at least 60 minutes with the patient today and more than 50% of the time was spent counseling the patient and coordinating care.    Disposition:   FU with undersigned after tests prn  Signed, Wende Bushy, MD  12/07/2016 2:37 PM    [place]  This note was generated in part with voice recognition software and I apologize for any typographical errors that were  not detected and corrected.

## 2016-12-08 ENCOUNTER — Other Ambulatory Visit: Payer: Self-pay

## 2016-12-08 ENCOUNTER — Ambulatory Visit (INDEPENDENT_AMBULATORY_CARE_PROVIDER_SITE_OTHER): Payer: PPO

## 2016-12-08 DIAGNOSIS — Z01818 Encounter for other preprocedural examination: Secondary | ICD-10-CM

## 2016-12-08 DIAGNOSIS — R0602 Shortness of breath: Secondary | ICD-10-CM

## 2016-12-08 DIAGNOSIS — I447 Left bundle-branch block, unspecified: Secondary | ICD-10-CM | POA: Diagnosis not present

## 2016-12-20 ENCOUNTER — Telehealth: Payer: Self-pay | Admitting: Cardiology

## 2016-12-20 NOTE — Telephone Encounter (Signed)
Pt calling stating she needs to reschedule her Myoview  For the day she had it for it snowed Please call

## 2016-12-20 NOTE — Telephone Encounter (Signed)
Spoke with patient and she wanted to reschedule her stress test which had to be canceled due to weather. Scheduled Lexiscan for 12/29/16 at 08:00AM and reviewed all instructions and information with her. She verbalized understanding and had no further questions at this time.

## 2016-12-20 NOTE — Telephone Encounter (Signed)
sw pt regarding cardiac clearance. Pt states her appt was cancelled due to the weather & hasn't been rescheduled.  Encouraged pt to call Dr Tora Kindred office to reschedule appt. Pt voices understanding.

## 2016-12-23 ENCOUNTER — Ambulatory Visit (INDEPENDENT_AMBULATORY_CARE_PROVIDER_SITE_OTHER): Payer: PPO | Admitting: Internal Medicine

## 2016-12-23 ENCOUNTER — Encounter: Payer: Self-pay | Admitting: Internal Medicine

## 2016-12-23 VITALS — BP 140/86 | HR 78 | Temp 98.0°F | Resp 16 | Ht 60.0 in | Wt 181.4 lb

## 2016-12-23 DIAGNOSIS — Z780 Asymptomatic menopausal state: Secondary | ICD-10-CM

## 2016-12-23 DIAGNOSIS — Z1231 Encounter for screening mammogram for malignant neoplasm of breast: Secondary | ICD-10-CM | POA: Diagnosis not present

## 2016-12-23 DIAGNOSIS — I447 Left bundle-branch block, unspecified: Secondary | ICD-10-CM | POA: Diagnosis not present

## 2016-12-23 DIAGNOSIS — E785 Hyperlipidemia, unspecified: Secondary | ICD-10-CM | POA: Diagnosis not present

## 2016-12-23 DIAGNOSIS — Z6834 Body mass index (BMI) 34.0-34.9, adult: Secondary | ICD-10-CM | POA: Diagnosis not present

## 2016-12-23 DIAGNOSIS — E559 Vitamin D deficiency, unspecified: Secondary | ICD-10-CM

## 2016-12-23 DIAGNOSIS — E6609 Other obesity due to excess calories: Secondary | ICD-10-CM | POA: Diagnosis not present

## 2016-12-23 DIAGNOSIS — Z23 Encounter for immunization: Secondary | ICD-10-CM

## 2016-12-23 DIAGNOSIS — I10 Essential (primary) hypertension: Secondary | ICD-10-CM | POA: Diagnosis not present

## 2016-12-23 DIAGNOSIS — R7301 Impaired fasting glucose: Secondary | ICD-10-CM

## 2016-12-23 DIAGNOSIS — R0602 Shortness of breath: Secondary | ICD-10-CM

## 2016-12-23 DIAGNOSIS — Z1239 Encounter for other screening for malignant neoplasm of breast: Secondary | ICD-10-CM

## 2016-12-23 DIAGNOSIS — Z85828 Personal history of other malignant neoplasm of skin: Secondary | ICD-10-CM

## 2016-12-23 LAB — COMPREHENSIVE METABOLIC PANEL
ALT: 21 U/L (ref 0–35)
AST: 18 U/L (ref 0–37)
Albumin: 4.3 g/dL (ref 3.5–5.2)
Alkaline Phosphatase: 62 U/L (ref 39–117)
BUN: 16 mg/dL (ref 6–23)
CHLORIDE: 105 meq/L (ref 96–112)
CO2: 28 mEq/L (ref 19–32)
CREATININE: 0.75 mg/dL (ref 0.40–1.20)
Calcium: 9.4 mg/dL (ref 8.4–10.5)
GFR: 82.42 mL/min (ref >60.00)
GLUCOSE: 96 mg/dL (ref 70–99)
POTASSIUM: 4.4 meq/L (ref 3.5–5.1)
SODIUM: 140 meq/L (ref 135–145)
Total Bilirubin: 1.3 mg/dL — ABNORMAL HIGH (ref 0.2–1.2)
Total Protein: 6.9 g/dL (ref 6.0–8.3)

## 2016-12-23 LAB — MICROALBUMIN / CREATININE URINE RATIO
Creatinine,U: 86.7 mg/dL
Microalb Creat Ratio: 0.8 mg/g (ref 0.0–30.0)
Microalb, Ur: 0.7 mg/dL (ref 0.0–1.9)

## 2016-12-23 LAB — LIPID PANEL
CHOL/HDL RATIO: 4
Cholesterol: 227 mg/dL — ABNORMAL HIGH (ref 0–200)
HDL: 51.6 mg/dL (ref >39.00)
LDL CALC: 157 mg/dL — AB (ref 0–99)
NonHDL: 175.64
TRIGLYCERIDES: 95 mg/dL (ref 0.0–149.0)
VLDL: 19 mg/dL (ref 0.0–40.0)

## 2016-12-23 LAB — HEMOGLOBIN A1C: Hgb A1c MFr Bld: 4.8 % (ref 4.6–6.5)

## 2016-12-23 LAB — LDL CHOLESTEROL, DIRECT: Direct LDL: 162 mg/dL

## 2016-12-23 LAB — TSH: TSH: 0.68 u[IU]/mL (ref 0.35–4.50)

## 2016-12-23 MED ORDER — LOSARTAN POTASSIUM 25 MG PO TABS: 25.0000 mg | ORAL_TABLET | Freq: Every day | ORAL | 0 refills | Status: DC

## 2016-12-23 NOTE — Patient Instructions (Addendum)
Your blood pressure has been elevated for several months.  I would like you to start taking losartan in addition to the metoprolol   I would like to see you again in 3 months , sooner if your labs indicate that you have diabetes

## 2016-12-23 NOTE — Progress Notes (Signed)
Patient ID: Julia Cooke, female    DOB: 07-11-1951  Age: 66 y.o. MRN: [id number]  The patient is here for annual follow up and management of other chronic and acute problems.  Last seen jan 2017   Flu vaccine given Hep C SCREEN DONE  Mammogram and  DEXA scan due last DEXA was done in 2014 [place] [facility] negative  PAP normal 2015 histoy of bcc 2004 left nasal crease no dermatology follow up  In years   The risk factors are reflected in the social history.  The roster of all physicians providing medical care to patient - is listed in the Snapshot section of the chart.  Activities of daily living:  The patient is 100% independent in all ADLs: dressing, toileting, feeding as well as independent mobility  Home safety : The patient has smoke detectors in the home. They wear seatbelts.  There are no firearms at home. There is no violence in the home.   There is no risks for hepatitis, STDs or HIV. There is no   history of blood transfusion. They have no travel history to infectious disease endemic areas of the world.  The patient has seen their dentist in the last six month. They have seen their eye doctor in the last year. They admit to slight hearing difficulty with regard to whispered voices and some television programs.  They have deferred audiologic testing in the last year.  They do not  have excessive sun exposure. Discussed the need for sun protection: hats, long sleeves and use of sunscreen if there is significant sun exposure.   Diet: the importance of a healthy diet is discussed. They do have a healthy diet.  The benefits of regular aerobic exercise were discussed. She walks 4 times per week ,  20 minutes.   Depression screen: there are no signs or vegative symptoms of depression- irritability, change in appetite, anhedonia, sadness/tearfullness.  Cognitive assessment: the patient manages all their financial and personal affairs and is actively engaged. They could relate  day,date,year and events; recalled 2/3 objects at 3 minutes; performed clock-face test normally.  The following portions of the patient's history were reviewed and updated as appropriate: allergies, current medications, past family history, past medical history,  past surgical history, past social history  and problem list.  Visual acuity was not assessed per patient preference since she has regular follow up with her ophthalmologist. Hearing and body mass index were assessed and reviewed.   During the course of the visit the patient was educated and counseled about appropriate screening and preventive services including : fall prevention , diabetes screening, nutrition counseling, colorectal cancer screening, and recommended immunizations.    CC: The primary encounter diagnosis was Impaired fasting glucose. Diagnoses of Encounter for immunization, Essential hypertension, Class 1 obesity due to excess calories without serious comorbidity with body mass index (BMI) of 34.0 to 34.9 in adult, Postmenopausal estrogen deficiency, Breast cancer screening, History of skin cancer, Hyperlipidemia LDL goal <100, LBBB (left bundle branch block), SOB (shortness of breath), and Vitamin D deficiency were also pertinent to this visit.  Obesit:  She has been exercising but not dieting in an attempt to lose weigt.   Marland Kitchendoes aerobics 3/week at [employer]  And has lost 4 lbs over the past year.  Eats a lot of pasta , averages a pasta , rice or potato every night.   She had postponement of procedure for lithotripsy for removal of a ureteral stone in December due to the  discovery of a  LBBB, and was sent for cardiac clearance    History Julia Cooke has a past medical history of Bipolar disorder (Julia Cooke); Depression; Dysrhythmia (10/2016); Heart murmur; Hypertension; Nephrolithiasis (2000); and Psychotic disorder.   She has a past surgical history that includes Lithotripsy (2000).   Her family history includes Cancer in her  sister; Fibromyalgia in her sister; Heart Problems in her sister; Heart attack in her brother; Heart disease in her brother; Hyperlipidemia in her father and mother; Hypertension in her brother, father, mother, sister, and sister; Stroke in her mother.She reports that she has never smoked. She has never used smokeless tobacco. She reports that she does not drink alcohol or use drugs.  Outpatient Medications Prior to Visit  Medication Sig Dispense Refill  . ARIPiprazole (ABILIFY) 2 MG tablet Take 1 tablet (2 mg total) by mouth daily. 30 tablet 2  . metoprolol succinate (TOPROL-XL) 25 MG 24 hr tablet TAKE 1 TABLET (25 MG TOTAL) BY MOUTH DAILY. 90 tablet 0   No facility-administered medications prior to visit.     Review of Systems   Patient denies headache, fevers, malaise, unintentional weight loss, skin rash, eye pain, sinus congestion and sinus pain, sore throat, dysphagia,  hemoptysis , cough, dyspnea, wheezing, chest pain, palpitations, orthopnea, edema, abdominal pain, nausea, melena, diarrhea, constipation, flank pain, dysuria, hematuria, urinary  Frequency, nocturia, numbness, tingling, seizures,  Focal weakness, Loss of consciousness,  Tremor, insomnia, untreated depression, anxiety, and suicidal ideation.     Objective:  BP 140/86   Pulse 78   Temp 98 F (36.7 C) (Oral)   Resp 16   Ht 5' (1.524 m)   Wt 181 lb 6 oz (82.3 kg)   SpO2 97%   BMI 35.42 kg/m   Physical Exam   General appearance: alert, cooperative and appears stated age Head: Normocephalic, without obvious abnormality, atraumatic Eyes: conjunctivae/corneas clear. PERRL, EOM's intact. Fundi benign. Ears: normal TM's and external ear canals both ears Nose: Nares normal. Septum midline. Mucosa normal. No drainage or sinus tenderness. Throat: lips, mucosa, and tongue normal; teeth and gums normal Neck: no adenopathy, no carotid bruit, no JVD, supple, symmetrical, trachea midline and thyroid not enlarged, symmetric, no  tenderness/mass/nodules Lungs: clear to auscultation bilaterally Breasts: normal appearance, no masses or tenderness Heart: regular rate and rhythm, S1, S2 normal, no murmur, click, rub or gallop Abdomen: soft, non-tender; bowel sounds normal; no masses,  no organomegaly Extremities: extremities normal, atraumatic, no cyanosis or edema Pulses: 2+ and symmetric Skin: Skin color, texture, turgor normal. No rashes or lesions Neurologic: Alert and oriented X 3, normal strength and tone. Normal symmetric reflexes. Normal coordination and gait.      Assessment & Plan:   Problem List Items Addressed This Visit    Breast cancer screening    Breast exam done, mammogram ordered       Relevant Orders   MM SCREENING BREAST TOMO BILATERAL   Essential hypertension    Not at goal on current dose of metoprolol.  Screen for diabetes and microalbuminuria was negative, and her resting heart rate is high enough to simply increase the metoprolol.  However, given her LBBB, and ARB may be more prudent.  Starting losartan at 25 mg daily   Lab Results  Component Value Date   CREATININE 0.75 12/23/2016   Lab Results  Component Value Date   NA 140 12/23/2016   K 4.4 12/23/2016   CL 105 12/23/2016   CO2 28 12/23/2016  Relevant Medications   losartan (COZAAR) 25 MG tablet   Other Relevant Orders   LDL cholesterol, direct (Completed)   Microalbumin / creatinine urine ratio (Completed)   Hyperlipidemia LDL goal <100    Based on current lipid profile, the risk of clinically significant CAD is 12 to 16% over the next 10 years, using the Framingham risk calculator. She has a strong FH of early CAD and CVD in family members who smoked. The [employer] of [employer] recommends starting patients aged 81 or higher on moderate intensity statin therapy for LDL between 70-189 and 10 yr risk of CAD > 7.5% ;  and high intensity therapy for anyone with LDL > 190. Will recommend statin therapy for primary  prevention  Lab Results  Component Value Date   CHOL 227 (H) 12/23/2016   HDL 51.60 12/23/2016   LDLCALC 157 (H) 12/23/2016   LDLDIRECT 162.0 12/23/2016   TRIG 95.0 12/23/2016   CHOLHDL 4 12/23/2016         Relevant Medications   losartan (COZAAR) 25 MG tablet   Impaired fasting glucose - Primary    She has no signs of prediabetes by A1c. t recommended wt loss of 10% of body weight over the next 6 months using a low glycemic index diet and regular exercise a minimum of 5 days per week.   Lab Results  Component Value Date   HGBA1C 4.8 12/23/2016            Relevant Orders   Hemoglobin A1c (Completed)   Lipid panel (Completed)   LBBB (left bundle branch block)    Incidental finding during medical clearance. Has been referred to cardiology . Stress test and nuc med study have been ordered for further  evaluation       Relevant Medications   losartan (COZAAR) 25 MG tablet   Obesity   Relevant Orders   Comprehensive metabolic panel (Completed)   TSH (Completed)   SOB (shortness of breath)    Deconditioning vs angina equivalent.  Awaiting results of cardiac risk assessment.       Vitamin D deficiency    Diagnosed last year.  Recommending a repeat dose of drisdol for 3 months        Other Visit Diagnoses    Encounter for immunization       Relevant Orders   Flu vaccine HIGH DOSE PF (Completed)   Postmenopausal estrogen deficiency       Relevant Orders   DG Bone Density   History of skin cancer       Relevant Orders   Ambulatory referral to Dermatology    A total of 40 minutes was spent with patient more than half of which was spent in counseling patient on the above mentioned issues , reviewing and explaining recent labs and imaging studies done, and coordination of care. I am having Ms. Sitzman start on losartan and ergocalciferol. I am also having her maintain her ARIPiprazole, metoprolol succinate, and amoxicillin.  Meds ordered this encounter  Medications   . amoxicillin (AMOXIL) 500 MG capsule    Sig: Take 1 capsule by mouth every 8 (eight) hours.  Marland Kitchen losartan (COZAAR) 25 MG tablet    Sig: Take 1 tablet (25 mg total) by mouth daily.    Dispense:  90 tablet    Refill:  0  . ergocalciferol (DRISDOL) 50000 units capsule    Sig: Take 1 capsule (50,000 Units total) by mouth once a week.    Dispense:  4 capsule  Refill:  2    There are no discontinued medications.  Follow-up: Return in about 3 months (around 03/23/2017) for hypertension pre diabetes .   Crecencio Mc, MD

## 2016-12-25 ENCOUNTER — Encounter: Payer: Self-pay | Admitting: Internal Medicine

## 2016-12-25 MED ORDER — ERGOCALCIFEROL 1.25 MG (50000 UT) PO CAPS: 50000.0000 [IU] | ORAL_CAPSULE | ORAL | 2 refills | Status: DC

## 2016-12-25 NOTE — Assessment & Plan Note (Signed)
Diagnosed last year.  Recommending a repeat dose of drisdol for 3 months

## 2016-12-25 NOTE — Assessment & Plan Note (Signed)
She has no signs of prediabetes by A1c. t recommended wt loss of 10% of body weight over the next 6 months using a low glycemic index diet and regular exercise a minimum of 5 days per week.   Lab Results  Component Value Date   HGBA1C 4.8 12/23/2016

## 2016-12-25 NOTE — Assessment & Plan Note (Addendum)
Not at goal on current dose of metoprolol.  Screen for diabetes and microalbuminuria was negative, and her resting heart rate is high enough to simply increase the metoprolol.  However, given her LBBB, and ARB may be more prudent.  Starting losartan at 25 mg daily   Lab Results  Component Value Date   CREATININE 0.75 12/23/2016   Lab Results  Component Value Date   NA 140 12/23/2016   K 4.4 12/23/2016   CL 105 12/23/2016   CO2 28 12/23/2016

## 2016-12-25 NOTE — Assessment & Plan Note (Signed)
Based on current lipid profile, the risk of clinically significant CAD is 12 to 16% over the next 10 years, using the Framingham risk calculator. She has a strong FH of early CAD and CVD in family members who smoked. The [employer] of [employer] recommends starting patients aged 65 or higher on moderate intensity statin therapy for LDL between 70-189 and 10 yr risk of CAD > 7.5% ;  and high intensity therapy for anyone with LDL > 190. Will recommend statin therapy for primary prevention  Lab Results  Component Value Date   CHOL 227 (H) 12/23/2016   HDL 51.60 12/23/2016   LDLCALC 157 (H) 12/23/2016   LDLDIRECT 162.0 12/23/2016   TRIG 95.0 12/23/2016   CHOLHDL 4 12/23/2016

## 2016-12-25 NOTE — Assessment & Plan Note (Signed)
Breast exam done, mammogram ordered

## 2016-12-25 NOTE — Assessment & Plan Note (Addendum)
Incidental finding during medical clearance. Has been referred to cardiology . Stress test and nuc med study have been ordered for further  evaluation

## 2016-12-25 NOTE — Assessment & Plan Note (Signed)
Deconditioning vs angina equivalent.  Awaiting results of cardiac risk assessment.

## 2016-12-27 ENCOUNTER — Other Ambulatory Visit: Payer: Self-pay | Admitting: Internal Medicine

## 2016-12-28 ENCOUNTER — Telehealth: Payer: Self-pay | Admitting: Cardiology

## 2016-12-28 NOTE — Telephone Encounter (Signed)
Left detailed voicemail message with stress test instructions and to call back if any further questions.

## 2016-12-29 ENCOUNTER — Encounter
Admission: RE | Admit: 2016-12-29 | Discharge: 2016-12-29 | Disposition: A | Discharge status: Home / Self Care | Payer: PPO | Source: Ambulatory Visit | Attending: Cardiology | Admitting: Cardiology

## 2016-12-29 DIAGNOSIS — Z01818 Encounter for other preprocedural examination: Secondary | ICD-10-CM | POA: Diagnosis not present

## 2016-12-29 DIAGNOSIS — I447 Left bundle-branch block, unspecified: Secondary | ICD-10-CM

## 2016-12-29 DIAGNOSIS — R0602 Shortness of breath: Secondary | ICD-10-CM | POA: Diagnosis not present

## 2016-12-29 LAB — NM MYOCAR MULTI W/SPECT W/WALL MOTION / EF
CHL CUP NUCLEAR SDS: 0
CHL CUP NUCLEAR SRS: 12
CHL CUP NUCLEAR SSS: 6
CSEPPHR: 118 {beats}/min
LV dias vol: 78 mL (ref 46–106)
LVSYSVOL: 36 mL
Percent HR: 76 %
Rest HR: 93 {beats}/min
TID: 1

## 2016-12-29 MED ORDER — REGADENOSON 0.4 MG/5ML IV SOLN
0.4000 mg | Freq: Once | INTRAVENOUS | Status: AC
  Administered 2016-12-29: 0.4 mg via INTRAVENOUS
  Filled 2016-12-29: qty 5

## 2016-12-29 MED ORDER — TECHNETIUM TC 99M TETROFOSMIN IV KIT
32.6600 | PACK | Freq: Once | INTRAVENOUS | Status: AC | PRN
  Administered 2016-12-29: 32.66 via INTRAVENOUS

## 2016-12-29 MED ORDER — TECHNETIUM TC 99M TETROFOSMIN IV KIT
12.0800 | PACK | Freq: Once | INTRAVENOUS | Status: AC | PRN
  Administered 2016-12-29: 12.08 via INTRAVENOUS

## 2016-12-31 ENCOUNTER — Other Ambulatory Visit: Payer: Self-pay | Admitting: Radiology

## 2016-12-31 ENCOUNTER — Ambulatory Visit
Admission: RE | Admit: 2016-12-31 | Discharge: 2016-12-31 | Disposition: A | Discharge status: Home / Self Care | Payer: PPO | Source: Ambulatory Visit | Attending: Urology | Admitting: Urology

## 2016-12-31 ENCOUNTER — Telehealth: Payer: Self-pay | Admitting: Radiology

## 2016-12-31 ENCOUNTER — Telehealth: Payer: Self-pay | Admitting: Cardiology

## 2016-12-31 DIAGNOSIS — N201 Calculus of ureter: Secondary | ICD-10-CM

## 2016-12-31 DIAGNOSIS — N202 Calculus of kidney with calculus of ureter: Secondary | ICD-10-CM | POA: Diagnosis not present

## 2016-12-31 NOTE — Telephone Encounter (Signed)
Pt had stress test on 1/31 - clearance is still pending Dr. Tora Kindred review and recommendation.

## 2016-12-31 NOTE — Telephone Encounter (Signed)
[facility] need clearance form returned.  Please fax back to Amy at [phone].

## 2016-12-31 NOTE — Telephone Encounter (Signed)
Advised pt to go to [facility] for KUB & she will be contacted with results. Pt voices understanding.

## 2016-12-31 NOTE — Telephone Encounter (Signed)
-----   Message from Hollice Espy, MD sent at 12/31/2016  2:23 PM EST ----- Regarding: RE: Cardiac clearance Yes, and if stone is still there, I'd like to get her on the schedule for Thursday.    Hollice Espy  ----- Message ----- From: Ranell Patrick, RN Sent: 12/31/2016   1:34 PM To: Hollice Espy, MD Subject: Cardiac clearance                              Per result note from stress test on 12/29/16, No evidence of ischemia, low risk study.  Would you like a repeat KUB prior to rescheduling her left ESWL?

## 2017-01-03 ENCOUNTER — Other Ambulatory Visit: Payer: Self-pay | Admitting: Radiology

## 2017-01-03 DIAGNOSIS — N201 Calculus of ureter: Secondary | ICD-10-CM

## 2017-01-03 NOTE — Telephone Encounter (Signed)
-----   Message from Hollice Espy, MD sent at 01/02/2017  1:55 PM EST ----- Stone still visible and unchanged.  Please get her back on the schedule for Thursday AM and let her know about her stone.  Hollice Espy, MD

## 2017-01-03 NOTE — Telephone Encounter (Signed)
Notified pt of KUB results & ESWL scheduled 01/06/17. Advised pt to arrive at 6:30 to [facility], be npo after mn, take laxative day prior to procedure, take medications as usual except no medications day of procedure, no aspirin products or NSAIDS beginning today (01/03/17) & have a driver accompany pt to procedure. Pt voices understanding.

## 2017-01-04 ENCOUNTER — Encounter: Payer: Self-pay | Admitting: Psychiatry

## 2017-01-04 ENCOUNTER — Ambulatory Visit (INDEPENDENT_AMBULATORY_CARE_PROVIDER_SITE_OTHER): Payer: PPO | Admitting: Psychiatry

## 2017-01-04 VITALS — BP 140/82 | HR 75 | Temp 98.6°F | Wt 181.2 lb

## 2017-01-04 DIAGNOSIS — F3110 Bipolar disorder, current episode manic without psychotic features, unspecified: Secondary | ICD-10-CM | POA: Diagnosis not present

## 2017-01-04 MED ORDER — ARIPIPRAZOLE 2 MG PO TABS: 2.0000 mg | ORAL_TABLET | Freq: Every day | ORAL | 2 refills | Status: DC

## 2017-01-04 NOTE — Telephone Encounter (Signed)
Clearance routed through Epic to number listed.  

## 2017-01-04 NOTE — Progress Notes (Signed)
Patient ID: Julia Cooke, female   DOB: 09-09-1951, 66 y.o.   MRN: [id number]   [facility] MD/PA/NP OP Progress Note  01/04/2017 10:52 AM [name] Shadava Clarizio  MRN:  [id number]  Subjective:  Patient returns for follow-up of her bipolar disorder. Patient reports that she has been doing quite well and has kept the improvements made since her last visit. Reports fair sleep and appetite. States that she has been exercising quite regularly. She is been attending a boot camp once a week and also does other exercises throughout the week. She continues to feel well most of the time. On a scale of 1-10 with 10 being her best mood and one really low she reports being at a 7. Denies any suicidal thoughts.  Chief Complaint: Much improved Chief Complaint    Follow-up; Medication Refill     Visit Diagnosis:     ICD-9-CM ICD-10-CM   1. Bipolar disorder in remission 296.40 F31.10     Past Medical History:  Past Medical History:  Diagnosis Date  . Bipolar disorder ([place])    takes Abilify  . Depression   . Dysrhythmia 10/2016   LBBB on ekg...nothing to compare it to.  Marland Kitchen Heart murmur   . Hypertension   . Nephrolithiasis 2000   s/p lithotripsy  . Psychotic disorder    mania    Past Surgical History:  Procedure Laterality Date  . LITHOTRIPSY  2000   Family History:  Family History  Problem Relation Age of Onset  . Hyperlipidemia Mother   . Stroke Mother   . Hypertension Mother   . Hyperlipidemia Father   . Hypertension Father   . Cancer Sister     breast  . Heart disease Brother     myocardial infarction  . Heart attack Brother   . Hypertension Brother   . Fibromyalgia Sister   . Hypertension Sister   . Heart Problems Sister   . Hypertension Sister    Social History:  Social History   Social History  . Marital status: Married    Spouse name: N/A  . Number of children: N/A  . Years of education: N/A   Social History Main Topics  . Smoking status: Never Smoker  . Smokeless tobacco: Never  Used  . Alcohol use No     Comment: 2-3 times weekly  . Drug use: No  . Sexual activity: Yes    Birth control/ protection: Post-menopausal, None   Other Topics Concern  . None   Social History Narrative   Patient lives at home with her husband of 25 years. They have 3 adult children (2 boys, 1 girl). They recently moved back to [place] in January. 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   Additional History:   Assessment:   Musculoskeletal: Strength & Muscle Tone: within normal limits Gait & Station: normal Patient leans: N/A  Psychiatric Specialty Exam: Medication Refill   Depression         Associated symptoms include does not have insomnia and no suicidal ideas.   Review of Systems  Psychiatric/Behavioral: Negative for depression, hallucinations, memory loss, substance abuse and suicidal ideas. The patient is not nervous/anxious and does not have insomnia.   All other systems reviewed and are negative.   Blood pressure 140/82, pulse 75, temperature 98.6 F (37 C), temperature source Oral, weight 181 lb 3.2 oz (82.2 kg).Body mass index is 35.39 kg/m.  General  Appearance: Neat and Well Groomed  Eye Contact:  Good  Speech:  Normal Rate  Volume:  Normal  Mood:  good  Affect: Pleasant and smiling   Thought Process:  Linear  Orientation:  Full (Time, Place, and Person)  Thought Content:  Negative  Suicidal Thoughts:  No  Homicidal Thoughts:  No  Memory:  Immediate;   Good Recent;   Good Remote;   Good  Judgement:  Good  Insight:  Good  Psychomotor Activity:  Negative  Concentration:  Good  Recall:  Good  Fund of Knowledge: Good  Language: Good  Akathisia:  Negative  Handed:   AIMS (if indicated):    Assets:  Communication Skills Desire for Improvement  ADL's:  Intact  Cognition: WNL  Sleep:  fair   Is the patient at risk to self?  No. Has the patient been a risk to self in the past  6 months?  No. Has the patient been a risk to self within the distant past?  Yes.   Is the patient a risk to others?  No. Has the patient been a risk to others in the past 6 months?  No. Has the patient been a risk to others within the distant past?  No.  Current Medications: Current Outpatient Prescriptions  Medication Sig Dispense Refill  . ARIPiprazole (ABILIFY) 2 MG tablet Take 1 tablet (2 mg total) by mouth daily. 30 tablet 2  . ergocalciferol (DRISDOL) 50000 units capsule Take 1 capsule (50,000 Units total) by mouth once a week. 4 capsule 2  . losartan (COZAAR) 25 MG tablet Take 1 tablet (25 mg total) by mouth daily. 90 tablet 0  . metoprolol succinate (TOPROL-XL) 25 MG 24 hr tablet TAKE 1 TABLET (25 MG TOTAL) BY MOUTH DAILY. 90 tablet 0   No current facility-administered medications for this visit.     Medical Decision Making:  Established Problem, Stable/Improving (1) and Review of Medication Regimen & Side Effects (2)  Treatment Plan Summary:Medication management and Plan   Bipolar disorder type I- In remission  Continue Abilifty at 2mg  po daily. Continue with [facility] for therapy.  Anxiety-Same as above.  Return to clinic in 3 months time or call before if necessary.   Cynde Menard 01/04/2017, 10:52 AM

## 2017-01-04 NOTE — Telephone Encounter (Signed)
Echo showed normal ejection fraction and overall unremarkable, from 12/08/2016. No ischemia on stress test from 12/29/2016. Although the stress test mentions hypokinesis, the echo that was recently done did not show this, and overall heart function is okay. Since echo is unremarkable, and no ischemia on stress test, patient is intermediate cardiac risk for moderate risk surgery. No indication for further cardiac testing at this point. No contraindication to proceed with surgery, from cardiac standpoint.

## 2017-01-05 MED ORDER — CIPROFLOXACIN HCL 500 MG PO TABS
500.0000 mg | ORAL_TABLET | ORAL | Status: AC
  Administered 2017-01-06: 500 mg via ORAL

## 2017-01-06 ENCOUNTER — Encounter
Admission: RE | Disposition: A | Discharge status: Home / Self Care | Payer: Self-pay | Source: Ambulatory Visit | Attending: Urology

## 2017-01-06 ENCOUNTER — Ambulatory Visit: Payer: PPO

## 2017-01-06 ENCOUNTER — Ambulatory Visit
Admission: RE | Admit: 2017-01-06 | Discharge: 2017-01-06 | Disposition: A | Discharge status: Home / Self Care | Payer: PPO | Source: Ambulatory Visit | Attending: Urology | Admitting: Urology

## 2017-01-06 ENCOUNTER — Encounter: Payer: Self-pay | Admitting: *Deleted

## 2017-01-06 DIAGNOSIS — N201 Calculus of ureter: Secondary | ICD-10-CM | POA: Insufficient documentation

## 2017-01-06 DIAGNOSIS — Z87442 Personal history of urinary calculi: Secondary | ICD-10-CM | POA: Diagnosis not present

## 2017-01-06 HISTORY — PX: EXTRACORPOREAL SHOCK WAVE LITHOTRIPSY: SHX1557

## 2017-01-06 SURGERY — LITHOTRIPSY, ESWL
Anesthesia: Moderate Sedation | Laterality: Left

## 2017-01-06 MED ORDER — HYDROCODONE-ACETAMINOPHEN 5-325 MG PO TABS
1.0000 | ORAL_TABLET | Freq: Four times a day (QID) | ORAL | 0 refills | Status: DC | PRN
Start: 2017-01-06 — End: 2017-03-29

## 2017-01-06 MED ORDER — DIAZEPAM 5 MG PO TABS
10.0000 mg | ORAL_TABLET | ORAL | Status: AC
  Administered 2017-01-06: 10 mg via ORAL

## 2017-01-06 MED ORDER — DOCUSATE SODIUM 100 MG PO CAPS: 100.0000 mg | ORAL_CAPSULE | Freq: Two times a day (BID) | ORAL | 0 refills | Status: DC

## 2017-01-06 MED ORDER — CIPROFLOXACIN HCL 500 MG PO TABS
ORAL_TABLET | ORAL | Status: AC
  Administered 2017-01-06: 500 mg via ORAL
  Filled 2017-01-06: qty 1

## 2017-01-06 MED ORDER — DEXTROSE-NACL 5-0.45 % IV SOLN
INTRAVENOUS | Status: DC
  Administered 2017-01-06: 07:00:00 via INTRAVENOUS

## 2017-01-06 MED ORDER — TAMSULOSIN HCL 0.4 MG PO CAPS: 0.4000 mg | ORAL_CAPSULE | Freq: Every day | ORAL | 0 refills | Status: DC

## 2017-01-06 MED ORDER — DIAZEPAM 5 MG PO TABS
ORAL_TABLET | ORAL | Status: AC
  Administered 2017-01-06: 10 mg via ORAL
  Filled 2017-01-06: qty 2

## 2017-01-06 MED ORDER — DIPHENHYDRAMINE HCL 25 MG PO CAPS
25.0000 mg | ORAL_CAPSULE | ORAL | Status: AC
  Administered 2017-01-06: 25 mg via ORAL

## 2017-01-06 MED ORDER — DIPHENHYDRAMINE HCL 25 MG PO CAPS
ORAL_CAPSULE | ORAL | Status: AC
  Administered 2017-01-06: 25 mg via ORAL
  Filled 2017-01-06: qty 1

## 2017-01-06 NOTE — OR Nursing (Signed)
Post ESWL instructions reviewed with patient and husband. Understanding verbalized.

## 2017-01-06 NOTE — OR Nursing (Signed)
Softball size reddened area noted on left buttock.

## 2017-01-06 NOTE — Discharge Instructions (Signed)
See [facility] discharge instructions in chart.  

## 2017-01-13 ENCOUNTER — Ambulatory Visit (INDEPENDENT_AMBULATORY_CARE_PROVIDER_SITE_OTHER): Payer: PPO | Admitting: Licensed Clinical Social Worker

## 2017-01-13 DIAGNOSIS — F3110 Bipolar disorder, current episode manic without psychotic features, unspecified: Secondary | ICD-10-CM | POA: Diagnosis not present

## 2017-01-13 NOTE — Progress Notes (Signed)
   THERAPIST PROGRESS NOTE  Session Time:66min  Participation Level: Active  Behavioral Response: CasualAlertEuthymic  Type of Therapy: Individual Therapy  Treatment Goals addressed: Coping  Interventions: CBT, Motivational Interviewing and Solution Focused  Summary: Julia Cooke is a 67 y.o. female who presents with continued symptoms of her diagnosis.  Patient reports that she has had several ups and downs.  She reports that she was admitted into the hospital for kidney stones.  Reports that she was her teeth worked on but is apprehensive.  Reports that she wants to stop cooking for the entire family daily.  She reports that she made lasagna for Valentines dinner.  Reports that she volunteers on Wednesday at church.  Reports that she is anxious but wants to put things in God's hands.  Reports that she wants to stop being in control of everything.  Reports that she stresses out about what to make for dinner each night.  Reports that she feels cluttered in her current home.  Reports that she does not know where to begin so she just leaves things where they are. Denies having friends that she interacts with.  Reports that she is involved in church book club.  Reports that she wants to improve her weight since she does not feel good about herself.  Suicidal/Homicidal: No  Therapist Response: Assessed tpt current functioning per her report.  Explored w/pt interactions at home and w/ friends.  Discussed positive outcomes and use of good self care   Plan: Return again in2 weeks.  Diagnosis: Axis I: Bipolar    Axis II: No diagnosis    Lubertha South, LCSW 01/13/2017

## 2017-01-24 ENCOUNTER — Ambulatory Visit (INDEPENDENT_AMBULATORY_CARE_PROVIDER_SITE_OTHER): Payer: PPO | Admitting: Urology

## 2017-01-24 ENCOUNTER — Encounter: Payer: Self-pay | Admitting: Urology

## 2017-01-24 ENCOUNTER — Ambulatory Visit
Admission: RE | Admit: 2017-01-24 | Discharge: 2017-01-24 | Disposition: A | Discharge status: Home / Self Care | Payer: PPO | Source: Ambulatory Visit | Attending: Urology | Admitting: Urology

## 2017-01-24 VITALS — BP 140/83 | HR 76 | Ht 60.25 in | Wt 179.6 lb

## 2017-01-24 DIAGNOSIS — N201 Calculus of ureter: Secondary | ICD-10-CM

## 2017-01-24 DIAGNOSIS — N202 Calculus of kidney with calculus of ureter: Secondary | ICD-10-CM | POA: Insufficient documentation

## 2017-01-24 DIAGNOSIS — N200 Calculus of kidney: Secondary | ICD-10-CM | POA: Diagnosis not present

## 2017-01-24 DIAGNOSIS — N132 Hydronephrosis with renal and ureteral calculous obstruction: Secondary | ICD-10-CM | POA: Diagnosis not present

## 2017-01-24 LAB — URINALYSIS, COMPLETE
BILIRUBIN UA: NEGATIVE
Glucose, UA: NEGATIVE
Ketones, UA: NEGATIVE
NITRITE UA: NEGATIVE
Protein, UA: NEGATIVE
RBC UA: NEGATIVE
Specific Gravity, UA: 1.01 (ref 1.005–1.030)
UUROB: 0.2 mg/dL (ref 0.2–1.0)
pH, UA: 5.5 (ref 5.0–7.5)

## 2017-01-24 LAB — MICROSCOPIC EXAMINATION

## 2017-01-24 NOTE — Progress Notes (Signed)
01/24/2017 10:24 AM   Julia Cooke December 29, 1950 [id number]  Referring provider: Crecencio Mc, MD [name], Julia [place] [id number]  Chief Complaint  Patient presents with  . Nephrolithiasis    HPI: 66 yo WF who presents today for a follow up after undergoing ESWL on 01/06/2017 for a left 8 mm UVJ stone.  Contrast CT performed on 11/16/2016 noted an 8 mm left UVJ stone with mild left hydronephrosis. Correlation with urinalysis recommended to exclude superimposed UTI. Multiple smaller and nonobstructing left renal calculi as well as tiny right renal stone noted. No hydronephrosis on the right. Sigmoid diverticulosis. No evidence of bowel obstruction or active inflammation. Normal appendix.  Second stone event.  Last event 14 years ago treated with laser lithotripsy.    KUB taken on 01/24/2017 (today) notes resolved left ureterovesical junction calculus since 01/06/2017.  Small bilateral nephrolithiasis.  I have independently reviewed the films.    Her post procedural course was uneventful and as expected.    Today, she is not having flank pain or gross hematuria.  She has not had fevers, chills, nausea or vomiting.   She has brought in fragments.     PMH: Past Medical History:  Diagnosis Date  . Bipolar disorder ([place])    takes Abilify  . Depression   . Dysrhythmia 10/2016   LBBB on ekg...nothing to compare it to.  Marland Kitchen Heart murmur   . Hypertension   . Nephrolithiasis 2000   s/p lithotripsy  . Psychotic disorder    mania    Surgical History: Past Surgical History:  Procedure Laterality Date  . EXTRACORPOREAL SHOCK WAVE LITHOTRIPSY Left 01/06/2017   Procedure: EXTRACORPOREAL SHOCK WAVE LITHOTRIPSY (ESWL);  Surgeon: Julia Espy, MD;  Location: [facility] ORS;  Service: Urology;  Laterality: Left;  . LITHOTRIPSY  2000    Home Medications:  Allergies as of 01/24/2017      Reactions   Penicillins Swelling   As a child      Medication List         Accurate as of 01/24/17 10:24 AM. Always use your most recent med list.          ARIPiprazole 2 MG tablet Commonly known as:  ABILIFY Take 1 tablet (2 mg total) by mouth daily.   docusate sodium 100 MG capsule Commonly known as:  COLACE Take 1 capsule (100 mg total) by mouth 2 (two) times daily.   ergocalciferol 50000 units capsule Commonly known as:  DRISDOL Take 1 capsule (50,000 Units total) by mouth once a week.   HYDROcodone-acetaminophen 5-325 MG tablet Commonly known as:  NORCO/VICODIN Take 1-2 tablets by mouth every 6 (six) hours as needed for moderate pain.   losartan 25 MG tablet Commonly known as:  COZAAR Take 1 tablet (25 mg total) by mouth daily.   metoprolol succinate 25 MG 24 hr tablet Commonly known as:  TOPROL-XL TAKE 1 TABLET (25 MG TOTAL) BY MOUTH DAILY.   tamsulosin 0.4 MG Caps capsule Commonly known as:  FLOMAX Take 1 capsule (0.4 mg total) by mouth daily.       Allergies:  Allergies  Allergen Reactions  . Penicillins Swelling    As a child    Family History: Family History  Problem Relation Age of Onset  . Hyperlipidemia Mother   . Stroke Mother   . Hypertension Mother   . Hyperlipidemia Father   . Hypertension Father   . Cancer Sister     breast  . Heart  disease Brother     myocardial infarction  . Heart attack Brother   . Hypertension Brother   . Fibromyalgia Sister   . Hypertension Sister   . Heart Problems Sister   . Hypertension Sister     Social History:  reports that she has never smoked. She has never used smokeless tobacco. She reports that she does not drink alcohol or use drugs.  ROS: UROLOGY Frequent Urination?: No Hard to postpone urination?: No Burning/pain with urination?: No Get up at night to urinate?: No Leakage of urine?: No Urine stream starts and stops?: No Trouble starting stream?: No Do you have to strain to urinate?: No Blood in urine?: No Urinary tract infection?: No Sexually transmitted  disease?: No Injury to kidneys or bladder?: No Painful intercourse?: No Weak stream?: No Currently pregnant?: No Vaginal bleeding?: No Last menstrual period?: n  Gastrointestinal Nausea?: No Vomiting?: No Indigestion/heartburn?: No Diarrhea?: No Constipation?: No  Constitutional Fever: No Night sweats?: No Weight loss?: No Fatigue?: No  Skin Skin rash/lesions?: No Itching?: No  Eyes Blurred vision?: No Double vision?: No  Ears/Nose/Throat Sore throat?: No Sinus problems?: No  Hematologic/Lymphatic Swollen glands?: No Easy bruising?: No  Cardiovascular Leg swelling?: No Chest pain?: No  Respiratory Cough?: No Shortness of breath?: No  Endocrine Excessive thirst?: No  Musculoskeletal Back pain?: No Joint pain?: No  Neurological Headaches?: No Dizziness?: No  Psychologic Depression?: No Anxiety?: No  Physical Exam: BP 140/83 (BP Location: Left Arm, Patient Position: Sitting, Cuff Size: Normal)   Pulse 76   Ht 5' 0.25" (1.53 m)   Wt 179 lb 9.6 oz (81.5 kg)   BMI 34.79 kg/m   Constitutional:  Alert and oriented, No acute distress. HEENT: [place] AT, moist mucus membranes.  Trachea midline, no masses. Cardiovascular: No clubbing, cyanosis, or edema. Respiratory: Normal respiratory effort, no increased work of breathing. GI: Abdomen is soft, nontender, nondistended, no abdominal masses GU: No CVA tenderness.  Skin: No rashes, bruises or suspicious lesions. Lymph: No cervical or inguinal adenopathy. Neurologic: Grossly intact, no focal deficits, moving all 4 extremities. Psychiatric: Normal mood and affect.  Laboratory Data: Lab Results  Component Value Date   WBC 8.2 11/16/2016   HGB 14.6 11/16/2016   HCT 41.0 11/16/2016   MCV 88.8 11/16/2016   PLT 210 11/16/2016    Lab Results  Component Value Date   CREATININE 0.75 12/23/2016    Lab Results  Component Value Date   HGBA1C 4.8 12/23/2016     Pertinent Imaging: CLINICAL DATA:   66 year old female status post lithotripsy on 01/06/2017, currently asymptomatic. Subsequent encounter.  EXAM: ABDOMEN - 1 VIEW  COMPARISON:  01/06/2017 KUB and earlier.  FINDINGS: 2 supine views of the abdomen and pelvis. The previously-seen 8 x 4 mm distal left ureteral calculus is no longer evident. Small pelvic phleboliths are stable. Superimposed nephrolithiasis seen in December on CT Abdomen and Pelvis is not well visualized radiographically. Normal bowel gas pattern. Chronic lower lumbar endplate degeneration. No acute osseous abnormality identified.  IMPRESSION: 1. Resolved left ureterovesical junction calculus since 01/06/2017. 2. Small bilateral nephrolithiasis.   Electronically Signed   By: Genevie Ann M.D.   On: 01/24/2017 09:54   Assessment & Plan:   1. Left UVJ stone  - s/p ESWL  - obtain RUS to ensure no hydronephrosis persists after passage of stone  - send fragments for analysis  2. Left hydronephrosis  - RUS pending  3. Bilateral nephrolithiasis  - patient does not want to pursue  a 24 hour urine study  - KUB yearly  - Advised to contact our office or seek treatment in the ED if becomes febrile or pain/ vomiting are difficult control in order to arrange for emergent/urgent intervention  Return for I will call patient with results.  [name], [facility] [address] [place], [place] [address] [phone]

## 2017-01-26 IMAGING — CR DG KNEE COMPLETE 4+V*R*
1 series · 4 of 4 positions shown · non-contrast
Comparison: 08/22/2014.

CLINICAL DATA: Right knee pain and swelling since [REDACTED].
Increasing pain today. No history of injury.

EXAM:
RIGHT KNEE - COMPLETE 4+ VIEW

[Series 1: dg knee complete 4 views right · 0.14mm/px · 4 of 4 slices shown]
[im 1/4]
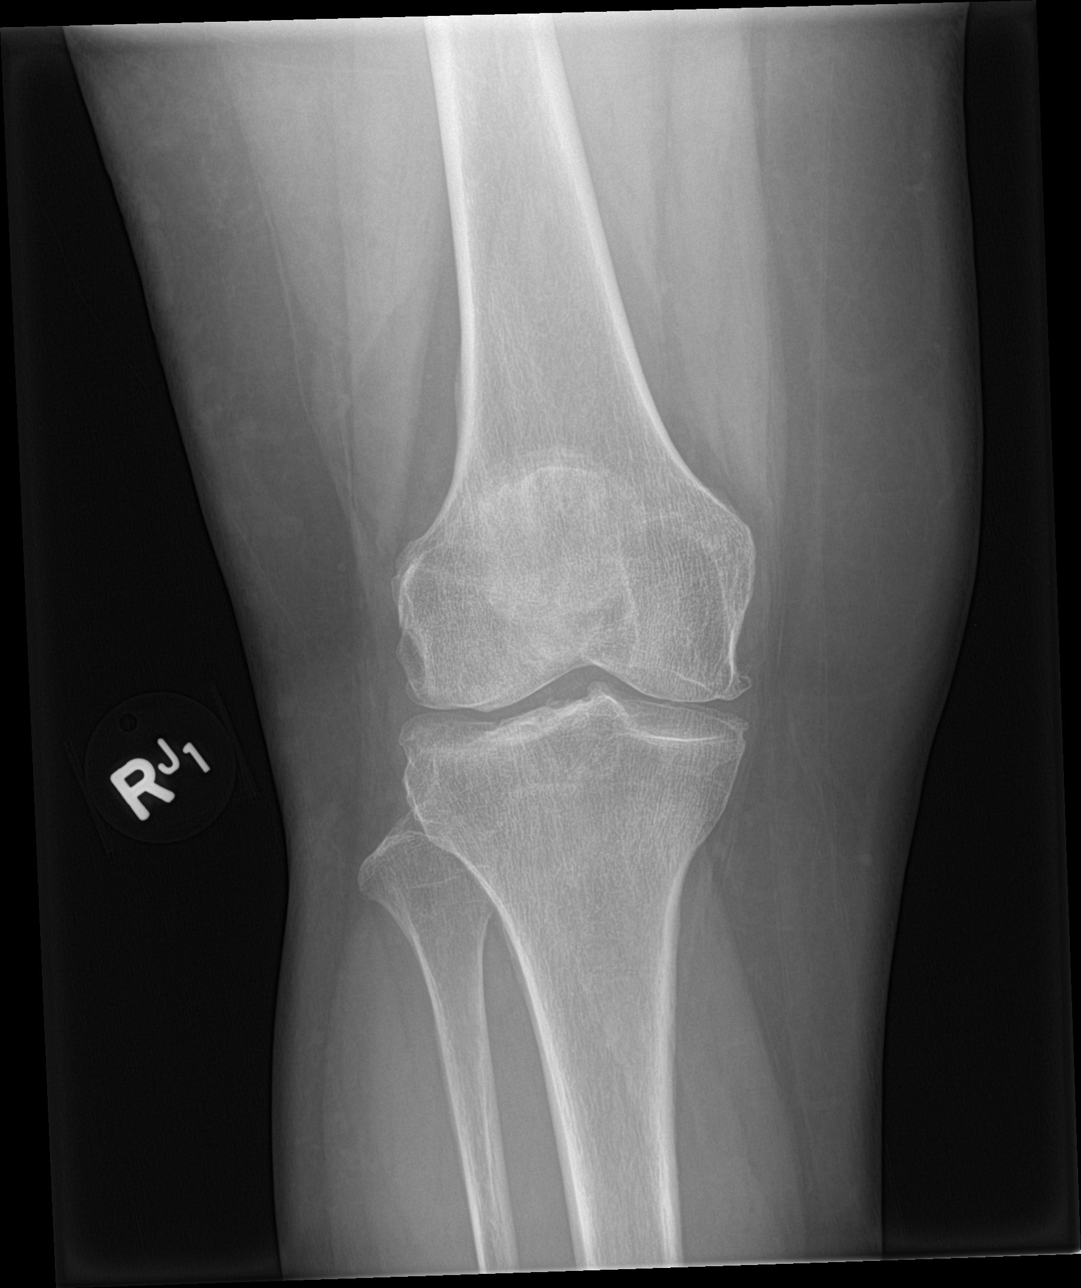
[im 2/4]
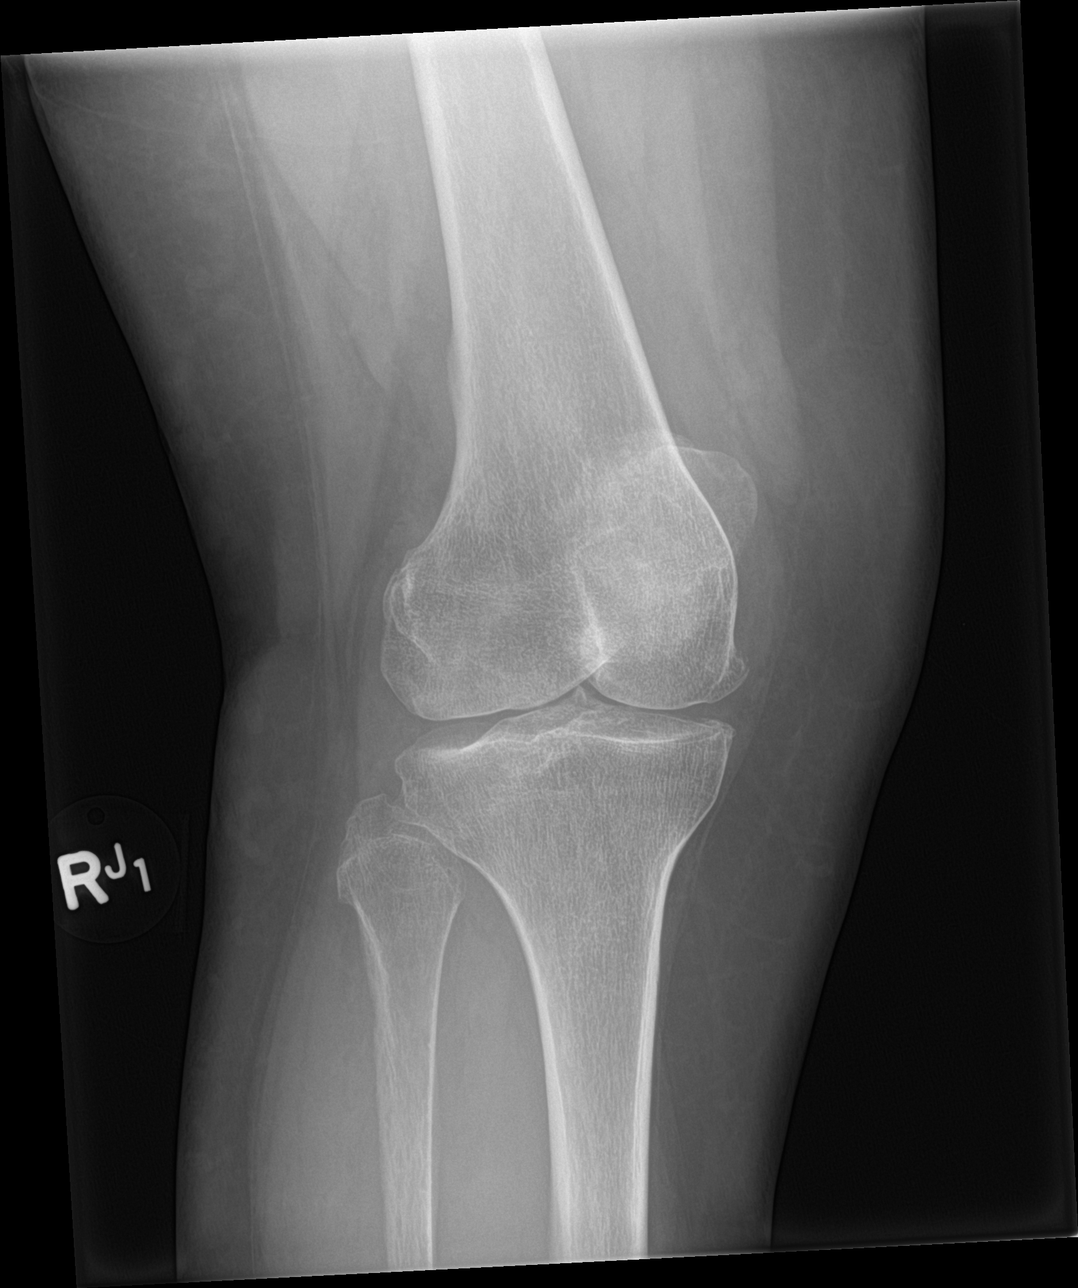
[im 3/4]
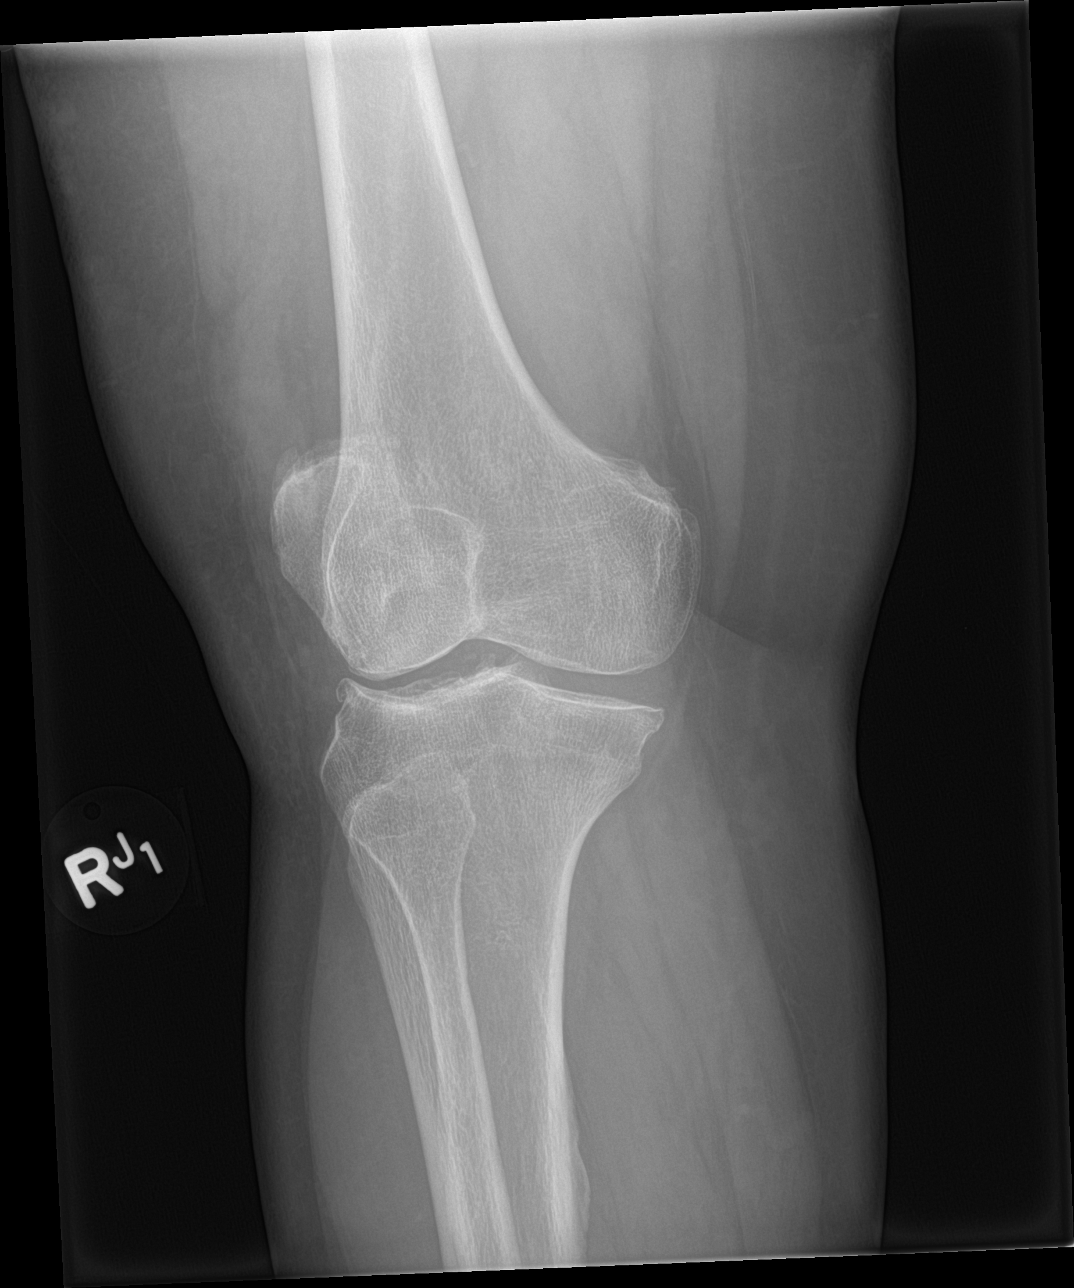
[im 4/4]
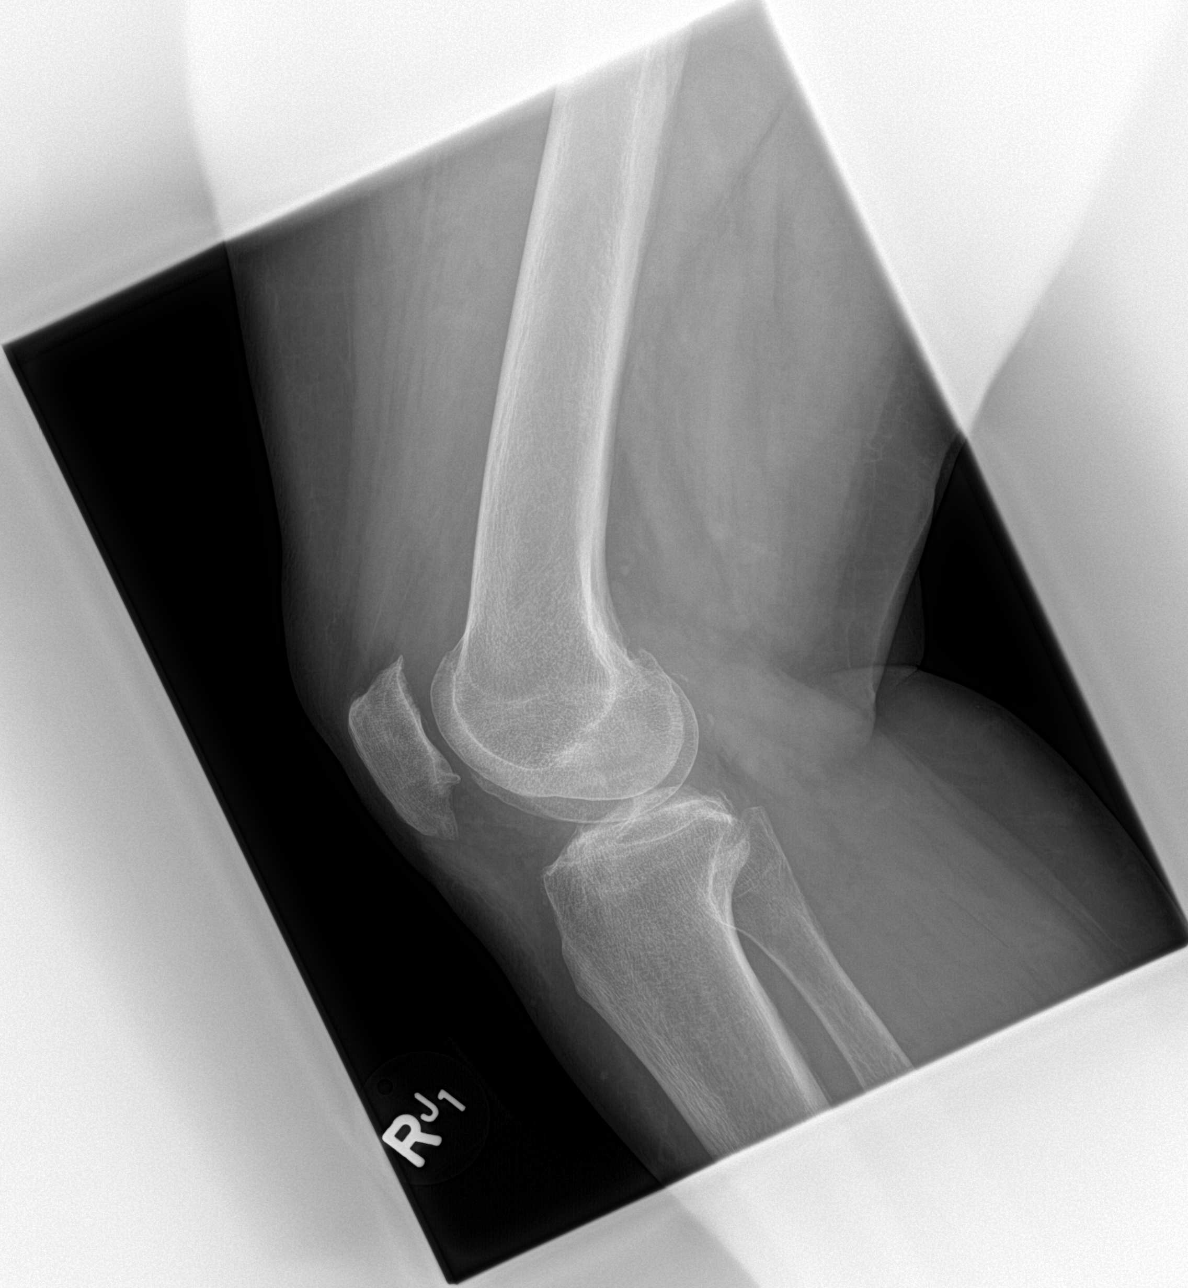

[4 of 4 positions shown; findings below may reference images not displayed]

FINDINGS: No fracture. No subluxation or dislocation. No substantial joint
effusion. Hypertrophic spurring is visible in all 3 compartments.
Mineralization of the lateral meniscus is evident.
IMPRESSION: Tricompartmental degenerative changes with evidence of
chondrocalcinosis.

## 2017-01-28 ENCOUNTER — Other Ambulatory Visit: Payer: Self-pay | Admitting: Urology

## 2017-02-01 ENCOUNTER — Other Ambulatory Visit: Payer: Self-pay | Admitting: Urology

## 2017-02-01 NOTE — Telephone Encounter (Signed)
Do you want pt to continue flomax?

## 2017-02-07 ENCOUNTER — Telehealth: Payer: Self-pay

## 2017-02-07 NOTE — Telephone Encounter (Signed)
-----   Message from Nori Riis, PA-C sent at 02/04/2017  3:50 PM EST ----- Please notify the patient that her stones were calcium phosphate.  She may choose to have a 24 hour study at this time.  In general, she needs to increase her water intake to 2.5 L (10 to 12 cups daily), reduce animal meat consumption (Meatless Mondays), reduce salt (look for foods labeled low sodium) and avoid sweetened drinks (sodas, sweet tea, etc.).  She also needs a RUS and I don't see one scheduled.  Her UA from her last visit had microscopic hematuria and we should recheck the UA once she's completed the RUS.  The ultrasound is important to ensure she does not have silent nephrosis. Silent nephrosis his condition with the kidney remains blocked and continues to be damaged. If this occurs for too long, she could lose the kidney.  We also need to make sure the blood in her urine clears. If the blood in her urine persists,  this may be a sign of kidney or bladder cancer.

## 2017-02-07 NOTE — Telephone Encounter (Signed)
No answer

## 2017-02-08 NOTE — Telephone Encounter (Signed)
No answer

## 2017-02-09 NOTE — Telephone Encounter (Signed)
No answer. Will send a letter.  

## 2017-02-22 ENCOUNTER — Ambulatory Visit
Admission: RE | Admit: 2017-02-22 | Discharge: 2017-02-22 | Disposition: A | Discharge status: Home / Self Care | Payer: PPO | Source: Ambulatory Visit | Attending: Internal Medicine | Admitting: Internal Medicine

## 2017-02-22 DIAGNOSIS — M8589 Other specified disorders of bone density and structure, multiple sites: Secondary | ICD-10-CM | POA: Insufficient documentation

## 2017-02-22 DIAGNOSIS — Z1239 Encounter for other screening for malignant neoplasm of breast: Secondary | ICD-10-CM

## 2017-02-22 DIAGNOSIS — Z1231 Encounter for screening mammogram for malignant neoplasm of breast: Secondary | ICD-10-CM | POA: Insufficient documentation

## 2017-02-22 DIAGNOSIS — M8588 Other specified disorders of bone density and structure, other site: Secondary | ICD-10-CM | POA: Diagnosis not present

## 2017-02-22 DIAGNOSIS — Z780 Asymptomatic menopausal state: Secondary | ICD-10-CM | POA: Diagnosis not present

## 2017-02-22 LAB — HM MAMMOGRAPHY

## 2017-02-24 ENCOUNTER — Encounter: Payer: Self-pay | Admitting: Internal Medicine

## 2017-02-24 ENCOUNTER — Ambulatory Visit
Admission: RE | Admit: 2017-02-24 | Discharge: 2017-02-24 | Disposition: A | Discharge status: Home / Self Care | Payer: PPO | Source: Ambulatory Visit | Attending: Urology | Admitting: Urology

## 2017-02-24 DIAGNOSIS — N132 Hydronephrosis with renal and ureteral calculous obstruction: Secondary | ICD-10-CM | POA: Diagnosis not present

## 2017-02-24 DIAGNOSIS — N200 Calculus of kidney: Secondary | ICD-10-CM | POA: Insufficient documentation

## 2017-03-04 NOTE — Progress Notes (Signed)
03/07/2017 3:53 PM   Julia Cooke [name] [birth year] [id number]  Referring provider: Crecencio Mc, MD [place], [place] [address]  Chief Complaint  Patient presents with  . Results    RUS  . Follow-up    after Litho    HPI: 66 yo WF who presents today for a her RUS report to evaluate for resolution of hydronephrosis after ESWL for an UVJ stone and UA to ensure hematuria has resolved.  Background history 66 yo WF who presents today for a follow up after undergoing ESWL on 01/06/2017 for a left 8 mm UVJ stone.  Contrast CT performed on 11/16/2016 noted an 8 mm left UVJ stone with mild left hydronephrosis. Correlation with urinalysis recommended to exclude superimposed UTI. Multiple smaller and nonobstructing left renal calculi as well as tiny right renal stone noted. No hydronephrosis on the right. Sigmoid diverticulosis. No evidence of bowel obstruction or active inflammation. Normal appendix.  Second stone event.  Last event 14 years ago treated with laser lithotripsy.  KUB taken on 01/24/2017 (today) notes resolved left ureterovesical junction calculus since 01/06/2017.  Small bilateral nephrolithiasis.  Her post procedural course was uneventful and as expected.  Today, she is not having flank pain or gross hematuria.  She has not had fevers, chills, nausea or vomiting.     Stone composition was calcium phosphate.    RUS performed on 02/24/2017 noted no hydronephrosis. 6 mm nonobstructing midpole stone in the right kidney.  I have independently reviewed the films.  Her UA today is unremarkable.     PMH: Past Medical History:  Diagnosis Date  . Bipolar disorder ([place])    takes Abilify  . Depression   . Dysrhythmia 10/2016   LBBB on ekg...nothing to compare it to.  Marland Kitchen Heart murmur   . Hypertension   . Nephrolithiasis 2000   s/p lithotripsy  . Psychotic disorder    mania    Surgical History: Past Surgical History:  Procedure Laterality Date  .  EXTRACORPOREAL SHOCK WAVE LITHOTRIPSY Left 01/06/2017   Procedure: EXTRACORPOREAL SHOCK WAVE LITHOTRIPSY (ESWL);  Surgeon: Hollice Espy, MD;  Location: [facility] ORS;  Service: Urology;  Laterality: Left;  . LITHOTRIPSY  2000    Home Medications:  Allergies as of 03/07/2017      Reactions   Penicillins Swelling   As a child      Medication List       Accurate as of 03/07/17  3:53 PM. Always use your most recent med list.          ARIPiprazole 2 MG tablet Commonly known as:  ABILIFY Take 1 tablet (2 mg total) by mouth daily.   docusate sodium 100 MG capsule Commonly known as:  COLACE Take 1 capsule (100 mg total) by mouth 2 (two) times daily.   ergocalciferol 50000 units capsule Commonly known as:  DRISDOL Take 1 capsule (50,000 Units total) by mouth once a week.   HYDROcodone-acetaminophen 5-325 MG tablet Commonly known as:  NORCO/VICODIN Take 1-2 tablets by mouth every 6 (six) hours as needed for moderate pain.   losartan 25 MG tablet Commonly known as:  COZAAR Take 1 tablet (25 mg total) by mouth daily.   metoprolol succinate 25 MG 24 hr tablet Commonly known as:  TOPROL-XL TAKE 1 TABLET (25 MG TOTAL) BY MOUTH DAILY.   tamsulosin 0.4 MG Caps capsule Commonly known as:  FLOMAX TAKE ONE CAPSULE BY MOUTH DAILY       Allergies:  Allergies  Allergen Reactions  . Penicillins Swelling    As a child    Family History: Family History  Problem Relation Age of Onset  . Hyperlipidemia Mother   . Stroke Mother   . Hypertension Mother   . Hyperlipidemia Father   . Hypertension Father   . Cancer Sister     breast  . Breast cancer Sister 63  . Heart disease Brother     myocardial infarction  . Heart attack Brother   . Hypertension Brother   . Fibromyalgia Sister   . Hypertension Sister   . Heart Problems Sister   . Hypertension Sister   . Prostate cancer Neg Hx   . Kidney cancer Neg Hx   . Bladder Cancer Neg Hx     Social History:  reports that she has never  smoked. She has never used smokeless tobacco. She reports that she does not drink alcohol or use drugs.  ROS: UROLOGY Frequent Urination?: No Hard to postpone urination?: No Burning/pain with urination?: No Get up at night to urinate?: Yes Leakage of urine?: No Urine stream starts and stops?: No Trouble starting stream?: No Do you have to strain to urinate?: No Blood in urine?: No Urinary tract infection?: No Sexually transmitted disease?: No Injury to kidneys or bladder?: No Painful intercourse?: No Weak stream?: No Currently pregnant?: No Vaginal bleeding?: No Last menstrual period?: n  Gastrointestinal Nausea?: No Vomiting?: No Indigestion/heartburn?: No Diarrhea?: No Constipation?: No  Constitutional Fever: No Night sweats?: No Weight loss?: No Fatigue?: No  Skin Skin rash/lesions?: No Itching?: Yes  Eyes Blurred vision?: No Double vision?: No  Ears/Nose/Throat Sore throat?: No Sinus problems?: Yes  Hematologic/Lymphatic Swollen glands?: No Easy bruising?: No  Cardiovascular Leg swelling?: No Chest pain?: No  Respiratory Cough?: No Shortness of breath?: No  Endocrine Excessive thirst?: No  Musculoskeletal Back pain?: No Joint pain?: Yes  Neurological Headaches?: No Dizziness?: No  Psychologic Depression?: No Anxiety?: No  Physical Exam: BP 133/77   Pulse 99   Ht 5\' 1"  (1.549 m)   Wt 180 lb 9.6 oz (81.9 kg)   BMI 34.12 kg/m   Constitutional:  Alert and oriented, No acute distress. HEENT: [place] AT, moist mucus membranes.  Trachea midline, no masses. Cardiovascular: No clubbing, cyanosis, or edema. Respiratory: Normal respiratory effort, no increased work of breathing. GI: Abdomen is soft, nontender, nondistended, no abdominal masses GU: No CVA tenderness.  Skin: No rashes, bruises or suspicious lesions. Lymph: No cervical or inguinal adenopathy. Neurologic: Grossly intact, no focal deficits, moving all 4  extremities. Psychiatric: Normal mood and affect.  Laboratory Data: Lab Results  Component Value Date   WBC 8.2 11/16/2016   HGB 14.6 11/16/2016   HCT 41.0 11/16/2016   MCV 88.8 11/16/2016   PLT 210 11/16/2016    Lab Results  Component Value Date   CREATININE 0.75 12/23/2016    Lab Results  Component Value Date   HGBA1C 4.8 12/23/2016     Pertinent Imaging: CLINICAL DATA:  Hydronephrosis secondary to urinary tract stones. History of lithotripsy in February 2018  EXAM: RENAL / URINARY TRACT ULTRASOUND COMPLETE  COMPARISON:  KUB of January 24, 2017  FINDINGS: Right Kidney:  Length: 10.4 cm. The renal cortical echotexture remains lower than that of the adjacent liver. There is a midpole nonobstructing stone measuring 6 mm in diameter. There is no hydronephrosis.  Left Kidney:  Length: 10.6 cm. The renal cortical echotexture is similar to that on the right. There is no hydronephrosis.  Bladder:  Appears normal for degree of bladder distention.  IMPRESSION: No hydronephrosis. 6 mm nonobstructing midpole stone in the right kidney.   Electronically Signed   By: David  Martinique M.D.   On: 02/24/2017 16:30  Assessment & Plan:   1. Left UVJ stone  - s/p ESWL  - stone composition - calcium phosphate.    2. Left hydronephrosis  - hydronephrosis has resolved  3. Bilateral nephrolithiasis  - patient does not want to pursue a 24 hour urine study  - KUB yearly  - Advised to contact our office or seek treatment in the ED if becomes febrile or pain/ vomiting are difficult control in order to arrange for emergent/urgent intervention  4. Microscopic hematuria  - UA today is unremarkable.    Return in about 1 year (around 03/07/2018) for KUB and office visit.  [name], [facility] [address] [place], [place] [address] [phone]

## 2017-03-07 ENCOUNTER — Encounter: Payer: Self-pay | Admitting: Urology

## 2017-03-07 ENCOUNTER — Ambulatory Visit (INDEPENDENT_AMBULATORY_CARE_PROVIDER_SITE_OTHER): Payer: PPO | Admitting: Urology

## 2017-03-07 VITALS — BP 133/77 | HR 99 | Ht 61.0 in | Wt 180.6 lb

## 2017-03-07 DIAGNOSIS — N200 Calculus of kidney: Secondary | ICD-10-CM

## 2017-03-07 DIAGNOSIS — R3129 Other microscopic hematuria: Secondary | ICD-10-CM | POA: Diagnosis not present

## 2017-03-07 DIAGNOSIS — N201 Calculus of ureter: Secondary | ICD-10-CM

## 2017-03-07 DIAGNOSIS — N132 Hydronephrosis with renal and ureteral calculous obstruction: Secondary | ICD-10-CM | POA: Diagnosis not present

## 2017-03-07 LAB — URINALYSIS, COMPLETE
Bilirubin, UA: NEGATIVE
Glucose, UA: NEGATIVE
KETONES UA: NEGATIVE
LEUKOCYTES UA: NEGATIVE
NITRITE UA: NEGATIVE
Protein, UA: NEGATIVE
RBC UA: NEGATIVE
Urobilinogen, Ur: 0.2 mg/dL (ref 0.2–1.0)
pH, UA: 5 (ref 5.0–7.5)

## 2017-03-10 NOTE — Telephone Encounter (Signed)
Mailed unread message to patient. thanks 

## 2017-03-20 ENCOUNTER — Other Ambulatory Visit: Payer: Self-pay | Admitting: Internal Medicine

## 2017-03-21 DIAGNOSIS — H5213 Myopia, bilateral: Secondary | ICD-10-CM | POA: Diagnosis not present

## 2017-03-22 DIAGNOSIS — D225 Melanocytic nevi of trunk: Secondary | ICD-10-CM | POA: Diagnosis not present

## 2017-03-22 DIAGNOSIS — L821 Other seborrheic keratosis: Secondary | ICD-10-CM | POA: Diagnosis not present

## 2017-03-22 DIAGNOSIS — D2261 Melanocytic nevi of right upper limb, including shoulder: Secondary | ICD-10-CM | POA: Diagnosis not present

## 2017-03-22 DIAGNOSIS — D485 Neoplasm of uncertain behavior of skin: Secondary | ICD-10-CM | POA: Diagnosis not present

## 2017-03-22 DIAGNOSIS — C44519 Basal cell carcinoma of skin of other part of trunk: Secondary | ICD-10-CM | POA: Diagnosis not present

## 2017-03-22 DIAGNOSIS — D2271 Melanocytic nevi of right lower limb, including hip: Secondary | ICD-10-CM | POA: Diagnosis not present

## 2017-03-28 ENCOUNTER — Encounter: Payer: Self-pay | Admitting: Internal Medicine

## 2017-03-29 ENCOUNTER — Encounter: Payer: Self-pay | Admitting: Internal Medicine

## 2017-03-29 ENCOUNTER — Ambulatory Visit (INDEPENDENT_AMBULATORY_CARE_PROVIDER_SITE_OTHER): Payer: PPO | Admitting: Internal Medicine

## 2017-03-29 DIAGNOSIS — F3170 Bipolar disorder, currently in remission, most recent episode unspecified: Secondary | ICD-10-CM

## 2017-03-29 DIAGNOSIS — Z85820 Personal history of malignant melanoma of skin: Secondary | ICD-10-CM | POA: Diagnosis not present

## 2017-03-29 DIAGNOSIS — E6609 Other obesity due to excess calories: Secondary | ICD-10-CM | POA: Diagnosis not present

## 2017-03-29 DIAGNOSIS — Z6834 Body mass index (BMI) 34.0-34.9, adult: Secondary | ICD-10-CM

## 2017-03-29 MED ORDER — BUPROPION HCL ER (XL) 150 MG PO TB24: 150.0000 mg | ORAL_TABLET | Freq: Every day | ORAL | 3 refills | Status: DC

## 2017-03-29 NOTE — Progress Notes (Signed)
Subjective:  Patient ID: Julia Cooke First, female    DOB: 06/20/1951  Age: 66 y.o. MRN: [id number]  CC: Diagnoses of History of melanoma, Class 1 obesity due to excess calories without serious comorbidity with body mass index (BMI) of 34.0 to 34.9 in adult, and Bipolar disorder in full remission, most recent episode unspecified type [facility]) were pertinent to this visit.  HPI Presents  for follow up on obesity and hypertension  She is frustrated at her inability to lose weight .  She is exercising vigorously a minimum of  4 days per week.,  2 hours per day .  Yoga,  Followed by 3 days of cardio and weights combined.  And  water aerobics   Diet reviewed:  She has been  "cutting back on everything"  Cooks  Every night,  Minimal intake of potatoes and rice.  Spinach, green beans broccoli,  Asparagus.   Chicken,  Fish,  Beef.  Only Eating 2 meals day . Skips breakfast, wakes up late and goes directly to workout  10:30-11:30  first meal. Eggs Mcmuffin some days,   Lunch is sometimes  1:00 pm   Snacks: jelly beans, sometimes  Discussed weight watchers.   Saw dermatology,  For skin biopsy  On backside  Melanoma suspected.  Cardiology and urology   All good    Lab Results  Component Value Date   CREATININE 0.75 12/23/2016   Lab Results  Component Value Date   HGBA1C 4.8 12/23/2016       Outpatient Medications Prior to Visit  Medication Sig Dispense Refill  . ARIPiprazole (ABILIFY) 2 MG tablet Take 1 tablet (2 mg total) by mouth daily. 30 tablet 2  . losartan (COZAAR) 25 MG tablet TAKE ONE TABLET BY MOUTH DAILY 90 tablet 0  . metoprolol succinate (TOPROL-XL) 25 MG 24 hr tablet TAKE 1 TABLET (25 MG TOTAL) BY MOUTH DAILY. 90 tablet 0  . docusate sodium (COLACE) 100 MG capsule Take 1 capsule (100 mg total) by mouth 2 (two) times daily. (Patient not taking: Reported on 01/24/2017) 60 capsule 0  . ergocalciferol (DRISDOL) 50000 units capsule Take 1 capsule (50,000 Units total) by mouth once a  week. (Patient not taking: Reported on 03/07/2017) 4 capsule 2  . HYDROcodone-acetaminophen (NORCO/VICODIN) 5-325 MG tablet Take 1-2 tablets by mouth every 6 (six) hours as needed for moderate pain. (Patient not taking: Reported on 01/24/2017) 10 tablet 0  . tamsulosin (FLOMAX) 0.4 MG CAPS capsule TAKE ONE CAPSULE BY MOUTH DAILY (Patient not taking: Reported on 03/07/2017) 30 capsule 0   No facility-administered medications prior to visit.     Review of Systems;  Patient denies headache, fevers, malaise, unintentional weight loss, skin rash, eye pain, sinus congestion and sinus pain, sore throat, dysphagia,  hemoptysis , cough, dyspnea, wheezing, chest pain, palpitations, orthopnea, edema, abdominal pain, nausea, melena, diarrhea, constipation, flank pain, dysuria, hematuria, urinary  Frequency, nocturia, numbness, tingling, seizures,  Focal weakness, Loss of consciousness,  Tremor, insomnia, depression, anxiety, and suicidal ideation.      Objective:  BP 102/64 (BP Location: Left Arm, Patient Position: Sitting, Cuff Size: Large)   Pulse 90   Temp 98.5 F (36.9 C) (Oral)   Resp 16   Ht 5\' 1"  (1.549 m)   Wt 179 lb 9.6 oz (81.5 kg)   SpO2 95%   BMI 33.94 kg/m   BP Readings from Last 3 Encounters:  03/29/17 102/64  03/07/17 133/77  01/24/17 140/83    Wt Readings from Last  3 Encounters:  03/29/17 179 lb 9.6 oz (81.5 kg)  03/07/17 180 lb 9.6 oz (81.9 kg)  01/24/17 179 lb 9.6 oz (81.5 kg)    General appearance: alert, cooperative and appears stated age Ears: normal TM's and external ear canals both ears Throat: lips, mucosa, and tongue normal; teeth and gums normal Neck: no adenopathy, no carotid bruit, supple, symmetrical, trachea midline and thyroid not enlarged, symmetric, no tenderness/mass/nodules Back: symmetric, no curvature. ROM normal. No CVA tenderness. Lungs: clear to auscultation bilaterally Heart: regular rate and rhythm, S1, S2 normal, no murmur, click, rub or  gallop Abdomen: soft, non-tender; bowel sounds normal; no masses,  no organomegaly Pulses: 2+ and symmetric Skin: Skin color, texture, turgor normal. No rashes or lesions Lymph nodes: Cervical, supraclavicular, and axillary nodes normal.  Lab Results  Component Value Date   HGBA1C 4.8 12/23/2016   HGBA1C 4.8 04/10/2014    Lab Results  Component Value Date   CREATININE 0.75 12/23/2016   CREATININE 1.08 (H) 11/16/2016   CREATININE 0.73 12/25/2015    Lab Results  Component Value Date   WBC 8.2 11/16/2016   HGB 14.6 11/16/2016   HCT 41.0 11/16/2016   PLT 210 11/16/2016   GLUCOSE 96 12/23/2016   CHOL 227 (H) 12/23/2016   TRIG 95.0 12/23/2016   HDL 51.60 12/23/2016   LDLDIRECT 162.0 12/23/2016   LDLCALC 157 (H) 12/23/2016   ALT 21 12/23/2016   AST 18 12/23/2016   NA 140 12/23/2016   K 4.4 12/23/2016   CL 105 12/23/2016   CREATININE 0.75 12/23/2016   BUN 16 12/23/2016   CO2 28 12/23/2016   TSH 0.68 12/23/2016   HGBA1C 4.8 12/23/2016   MICROALBUR 0.7 12/23/2016    US Renal  Result Date: 02/24/2017 CLINICAL DATA:  Hydronephrosis secondary to urinary tract stones. History of lithotripsy in February 2018 EXAM: RENAL / URINARY TRACT ULTRASOUND COMPLETE COMPARISON:  KUB of January 24, 2017 FINDINGS: Right Kidney: Length: 10.4 cm. The renal cortical echotexture remains lower than that of the adjacent liver. There is a midpole nonobstructing stone measuring 6 mm in diameter. There is no hydronephrosis. Left Kidney: Length: 10.6 cm. The renal cortical echotexture is similar to that on the right. There is no hydronephrosis. Bladder: Appears normal for degree of bladder distention. IMPRESSION: No hydronephrosis. 6 mm nonobstructing midpole stone in the right kidney. Electronically Signed   By: David  Martinique M.D.   On: 02/24/2017 16:30    Assessment & Plan:   Problem List Items Addressed This Visit    Bipolar disorder ([place])    She is endorsing poor energy level,  Decreased libido,  and decreased concentration.  No prior trial of wellbutrin.       History of melanoma     Now getting dermatology follow up at [facility] dermatology,  Recent biopsy pending of back.       Obesity    She is exercising vigorously but skipping meals and not sticking to a low carbohydrate diet ,so she is not making any progress.  Recommending weight watchers ,  wellbutrin for fatigue and appetite suppression        A total of 25 minutes of face to face time was spent with patient more than half of which was spent in counselling about the above mentioned conditions  and coordination of care  I have discontinued Ms. Babel's ergocalciferol, docusate sodium, HYDROcodone-acetaminophen, and tamsulosin. I am also having her start on buPROPion. Additionally, I am having her maintain her metoprolol succinate,  ARIPiprazole, and losartan.  Meds ordered this encounter  Medications  . buPROPion (WELLBUTRIN XL) 150 MG 24 hr tablet    Sig: Take 1 tablet (150 mg total) by mouth daily.    Dispense:  30 tablet    Refill:  3    Medications Discontinued During This Encounter  Medication Reason  . docusate sodium (COLACE) 100 MG capsule Patient has not taken in last 30 days  . ergocalciferol (DRISDOL) 50000 units capsule Patient has not taken in last 30 days  . HYDROcodone-acetaminophen (NORCO/VICODIN) 5-325 MG tablet Patient has not taken in last 30 days  . tamsulosin (FLOMAX) 0.4 MG CAPS capsule Patient has not taken in last 30 days    Follow-up: No Follow-up on file.   Crecencio Mc, MD

## 2017-03-29 NOTE — Patient Instructions (Addendum)
Try to get 3 16 ounce servings of water daily  Do  NOT skip meals!  This slows down your metabolism .    Power Crunch bar is an alternative that is low sugar and soft   Start taking 2000 IUs of D3 daily  .  We'll check your level again in July with fasting labs .    Trial of wellbutrin for help with fatigue and concentration

## 2017-03-29 NOTE — Progress Notes (Signed)
Pre visit review using our clinic review tool, if applicable. No additional management support is needed unless otherwise documented below in the visit note. 

## 2017-03-29 NOTE — Assessment & Plan Note (Signed)
Now getting dermatology follow up at [facility] dermatology,  Recent biopsy pending of back.

## 2017-03-30 NOTE — Assessment & Plan Note (Addendum)
She is exercising vigorously but skipping meals and not sticking to a low carbohydrate diet ,so she is not making any progress.  Recommending weight watchers ,  wellbutrin for fatigue and appetite suppression

## 2017-03-30 NOTE — Assessment & Plan Note (Signed)
She is endorsing poor energy level,  Decreased libido, and decreased concentration.  No prior trial of wellbutrin.

## 2017-04-04 ENCOUNTER — Encounter: Payer: Self-pay | Admitting: Psychiatry

## 2017-04-04 ENCOUNTER — Ambulatory Visit (INDEPENDENT_AMBULATORY_CARE_PROVIDER_SITE_OTHER): Payer: PPO | Admitting: Licensed Clinical Social Worker

## 2017-04-04 ENCOUNTER — Ambulatory Visit (INDEPENDENT_AMBULATORY_CARE_PROVIDER_SITE_OTHER): Payer: PPO | Admitting: Psychiatry

## 2017-04-04 VITALS — BP 127/73 | HR 76 | Temp 98.8°F | Wt 180.4 lb

## 2017-04-04 DIAGNOSIS — F3110 Bipolar disorder, current episode manic without psychotic features, unspecified: Secondary | ICD-10-CM

## 2017-04-04 MED ORDER — ARIPIPRAZOLE 2 MG PO TABS: 2.0000 mg | ORAL_TABLET | Freq: Every day | ORAL | 2 refills | Status: DC

## 2017-04-04 NOTE — Progress Notes (Signed)
Patient ID: Julia Cooke, female   DOB: 08-05-1951, 66 y.o.   MRN: [id number]   [facility] MD/PA/NP OP Progress Note  04/04/2017 1:20 PM Julia Cooke  MRN:  [id number]  Subjective:  Patient returns for follow-up of her bipolar disorder. Patient reports that she has been doing quite well  Denies any suicidal thoughts. She was started on Wellbutrin by her PCP, states she was in a slump and her PCP decided to add the wellbutrin. States she is doing well on it, having more energy. She continues to exercise daily and working on losing weight.  Chief Complaint: Much improved Chief Complaint    Follow-up; Medication Refill     Visit Diagnosis:     ICD-9-CM ICD-10-CM   1. Bipolar disorder in remission 296.40 F31.10     Past Medical History:  Past Medical History:  Diagnosis Date  . Bipolar disorder ([place])    takes Abilify  . Depression   . Dysrhythmia 10/2016   LBBB on ekg...nothing to compare it to.  Marland Kitchen Heart murmur   . Hypertension   . Nephrolithiasis 2000   s/p lithotripsy  . Psychotic disorder    mania    Past Surgical History:  Procedure Laterality Date  . EXTRACORPOREAL SHOCK WAVE LITHOTRIPSY Left 01/06/2017   Procedure: EXTRACORPOREAL SHOCK WAVE LITHOTRIPSY (ESWL);  Surgeon: Hollice Espy, MD;  Location: [facility] ORS;  Service: Urology;  Laterality: Left;  . LITHOTRIPSY  2000   Family History:  Family History  Problem Relation Age of Onset  . Hyperlipidemia Mother   . Stroke Mother   . Hypertension Mother   . Hyperlipidemia Father   . Hypertension Father   . Cancer Sister     breast  . Breast cancer Sister 66  . Heart disease Brother     myocardial infarction  . Heart attack Brother   . Hypertension Brother   . Fibromyalgia Sister   . Hypertension Sister   . Heart Problems Sister   . Hypertension Sister   . Prostate cancer Neg Hx   . Kidney cancer Neg Hx   . Bladder Cancer Neg Hx    Social History:  Social History   Social History  . Marital status: Married   Spouse name: N/A  . Number of children: N/A  . Years of education: N/A   Social History Main Topics  . Smoking status: Never Smoker  . Smokeless tobacco: Never Used  . Alcohol use No     Comment: 2-3 times weekly  . Drug use: No  . Sexual activity: Yes    Birth control/ protection: Post-menopausal, None   Other Topics Concern  . None   Social History Narrative   Patient lives at home with her husband of 81 years. They have 3 adult children (2 boys, 1 girl). They recently moved back to [place] in January. 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   Additional History:   Assessment:   Musculoskeletal: Strength & Muscle Tone: within normal limits Gait & Station: normal Patient leans: N/A  Psychiatric Specialty Exam: Medication Refill   Depression         Associated symptoms include does not have insomnia and no suicidal ideas.   Review of Systems  Psychiatric/Behavioral: Negative for depression, hallucinations, memory loss, substance abuse and suicidal ideas. The patient is not nervous/anxious and does not have insomnia.   All other systems reviewed and are negative.  Blood pressure 127/73, pulse 76, temperature 98.8 F (37.1 C), temperature source Oral, weight 180 lb 6.4 oz (81.8 kg).Body mass index is 34.09 kg/m.  General Appearance: Neat and Well Groomed  Eye Contact:  Good  Speech:  Normal Rate  Volume:  Normal  Mood:  good  Affect: Pleasant and smiling   Thought Process:  Linear  Orientation:  Full (Time, Place, and Person)  Thought Content:  Negative  Suicidal Thoughts:  No  Homicidal Thoughts:  No  Memory:  Immediate;   Good Recent;   Good Remote;   Good  Judgement:  Good  Insight:  Good  Psychomotor Activity:  Negative  Concentration:  Good  Recall:  Good  Fund of Knowledge: Good  Language: Good  Akathisia:  Negative  Handed:   AIMS (if indicated):    Assets:  Communication  Skills Desire for Improvement  ADL's:  Intact  Cognition: WNL  Sleep:  fair   Is the patient at risk to self?  No. Has the patient been a risk to self in the past 6 months?  No. Has the patient been a risk to self within the distant past?  Yes.   Is the patient a risk to others?  No. Has the patient been a risk to others in the past 6 months?  No. Has the patient been a risk to others within the distant past?  No.  Current Medications: Current Outpatient Prescriptions  Medication Sig Dispense Refill  . ARIPiprazole (ABILIFY) 2 MG tablet Take 1 tablet (2 mg total) by mouth daily. 30 tablet 2  . buPROPion (WELLBUTRIN XL) 150 MG 24 hr tablet Take 1 tablet (150 mg total) by mouth daily. 30 tablet 3  . losartan (COZAAR) 25 MG tablet TAKE ONE TABLET BY MOUTH DAILY 90 tablet 0  . metoprolol succinate (TOPROL-XL) 25 MG 24 hr tablet TAKE 1 TABLET (25 MG TOTAL) BY MOUTH DAILY. 90 tablet 0   No current facility-administered medications for this visit.     Medical Decision Making:  Established Problem, Stable/Improving (1) and Review of Medication Regimen & Side Effects (2)  Treatment Plan Summary:Medication management and Plan   Bipolar disorder type I- In remission  Continue Abilifty at 2mg  po daily. Continue Wellbutrin at 150mg  po qd as prescribed by her Primary care physician. Continue with [facility] for therapy.  Anxiety-Same as above.  Return to clinic in 3 months time or call before if necessary.   Julia Cooke 04/04/2017, 1:20 PM

## 2017-04-05 NOTE — Progress Notes (Signed)
   THERAPIST PROGRESS NOTE  Session Time:87min  Participation Level: Active  Behavioral Response: CasualAlertEuthymic  Type of Therapy: Individual Therapy  Treatment Goals addressed: Coping  Interventions: CBT, Motivational Interviewing and Solution Focused  Summary: Julia Cooke is a 66 y.o. female who presents with continued symptoms of her diagnosis.  Patient reports she is currently having a "ok" mood.  She reports that she worries about the household finances.  She reports that she and her husband do not have enough money to make it monthly without assistance.  She reports that she wants to open a business but worries that it will not be successful.  She attempted to weigh out the pros and cons in session but was unsuccessful.  She reports that she is unable to concentrate on one topic.    Suicidal/Homicidal: No  Therapist Response: Assessed tpt current functioning per her report.  Discussed positive outcomes and use of good self care   Plan: Return again in2 weeks.  Diagnosis: Axis I: Bipolar    Axis II: No diagnosis    Lubertha South, LCSW 04/05/2017

## 2017-04-18 DIAGNOSIS — C44519 Basal cell carcinoma of skin of other part of trunk: Secondary | ICD-10-CM | POA: Diagnosis not present

## 2017-04-26 ENCOUNTER — Ambulatory Visit (INDEPENDENT_AMBULATORY_CARE_PROVIDER_SITE_OTHER): Payer: PPO | Admitting: Licensed Clinical Social Worker

## 2017-04-26 DIAGNOSIS — F3110 Bipolar disorder, current episode manic without psychotic features, unspecified: Secondary | ICD-10-CM

## 2017-05-02 ENCOUNTER — Other Ambulatory Visit: Payer: Self-pay | Admitting: Internal Medicine

## 2017-05-02 NOTE — Progress Notes (Signed)
   THERAPIST PROGRESS NOTE  Session Time:27min  Participation Level: Active  Behavioral Response: CasualAlertEuthymic  Type of Therapy: Individual Therapy  Treatment Goals addressed: Coping  Interventions: CBT, Motivational Interviewing and Solution Focused  Summary: Julia Cooke is a 66 y.o. female who presents with continued symptoms of her diagnosis.  Patient reports she is currently having a "It's ok" mood. Reports that she feels like she is a burden to her family. She reports that she wants to open a business to help with finances in her home.  She reports that she has not motivation to do anythings.  She denies completing homework activities to [employer] (business she wants to start).  Listed the pros and cons of owning her own business.  Suicidal/Homicidal: No  Therapist Response: LCSW provided Patient with ongoing emotional support and encouragement.  Normalized her feelings.  Commended Patient on her progress and reinforced the importance of client staying focused on her own strengths and resources and resiliency. Processed various strategies for dealing with stressors.     Plan: Return again in2 weeks.  Diagnosis: Axis I: Bipolar    Axis II: No diagnosis    Lubertha South, LCSW 05/02/2017

## 2017-05-26 ENCOUNTER — Ambulatory Visit: Payer: PPO | Admitting: Licensed Clinical Social Worker

## 2017-05-31 ENCOUNTER — Encounter: Payer: Self-pay | Admitting: Internal Medicine

## 2017-05-31 ENCOUNTER — Ambulatory Visit (INDEPENDENT_AMBULATORY_CARE_PROVIDER_SITE_OTHER): Payer: PPO | Admitting: Internal Medicine

## 2017-05-31 VITALS — BP 112/74 | HR 78 | Temp 98.5°F | Resp 15 | Ht 61.0 in | Wt 180.8 lb

## 2017-05-31 DIAGNOSIS — F5101 Primary insomnia: Secondary | ICD-10-CM

## 2017-05-31 DIAGNOSIS — E6609 Other obesity due to excess calories: Secondary | ICD-10-CM

## 2017-05-31 DIAGNOSIS — Z6834 Body mass index (BMI) 34.0-34.9, adult: Secondary | ICD-10-CM | POA: Diagnosis not present

## 2017-05-31 DIAGNOSIS — R7301 Impaired fasting glucose: Secondary | ICD-10-CM | POA: Diagnosis not present

## 2017-05-31 DIAGNOSIS — E785 Hyperlipidemia, unspecified: Secondary | ICD-10-CM

## 2017-05-31 DIAGNOSIS — E66811 Obesity, class 1: Secondary | ICD-10-CM

## 2017-05-31 DIAGNOSIS — I10 Essential (primary) hypertension: Secondary | ICD-10-CM

## 2017-05-31 NOTE — Progress Notes (Signed)
Subjective:  Patient ID: Julia Cooke, female    DOB: Sep 17, 1951  Age: 66 y.o. MRN: [id number]  CC: The primary encounter diagnosis was Essential hypertension. Diagnoses of Impaired fasting glucose, Hyperlipidemia with target LDL less than 130, Class 1 obesity due to excess calories without serious comorbidity with body mass index (BMI) of 34.0 to 34.9 in adult, and Primary insomnia were also pertinent to this visit.  HPI Julia Cooke presents for follow up on hypertension and obeisty  Exercising 5 days per week . Spending 2 hours per day,  Aerobics,  Yoga and cardio and resistance training, plus a swim class.  Has not lost any weight.  Eats ice cream or cake every night ,  eats bread , pasta  And potatoes  Skips meals often averages 2/day   Trouble falling back to sleep , woken up by dry mouth,  Minimal snoring . Does not take any sleep aides regularly.    Wakes up 2 to 3 times per night . Bedtime hygiene reviewed,  Patient has been using an electronic book to read before bed.  Does not drink caffeinated beverages after 3 PM.  Only voids  bladder once per night.  No snoring partner. Does not drink alcohol to excess.  Not exercising excessively in the evening. Patient does have a history of anxiety but does not lie awake worrying about issues that cannot be resolved. Does not take stimulants. .  Outpatient Medications Prior to Visit  Medication Sig Dispense Refill  . ARIPiprazole (ABILIFY) 2 MG tablet Take 1 tablet (2 mg total) by mouth daily. 30 tablet 2  . buPROPion (WELLBUTRIN XL) 150 MG 24 hr tablet Take 1 tablet (150 mg total) by mouth daily. 30 tablet 3  . losartan (COZAAR) 25 MG tablet TAKE ONE TABLET BY MOUTH DAILY 90 tablet 0  . metoprolol succinate (TOPROL-XL) 25 MG 24 hr tablet TAKE ONE TABLET BY MOUTH DAILY 90 tablet 0  . metoprolol succinate (TOPROL-XL) 25 MG 24 hr tablet TAKE 1 TABLET (25 MG TOTAL) BY MOUTH DAILY. (Patient not taking: Reported on 05/31/2017) 90 tablet 0   No  facility-administered medications prior to visit.     Review of Systems;  Patient denies headache, fevers, malaise, unintentional weight loss, skin rash, eye pain, sinus congestion and sinus pain, sore throat, dysphagia,  hemoptysis , cough, dyspnea, wheezing, chest pain, palpitations, orthopnea, edema, abdominal pain, nausea, melena, diarrhea, constipation, flank pain, dysuria, hematuria, urinary  Frequency, nocturia, numbness, tingling, seizures,  Focal weakness, Loss of consciousness,  Tremor, insomnia, depression, anxiety, and suicidal ideation.      Objective:  BP 112/74 (BP Location: Left Arm, Patient Position: Sitting, Cuff Size: Large)   Pulse 78   Temp 98.5 F (36.9 C) (Oral)   Resp 15   Ht 5\' 1"  (1.549 m)   Wt 180 lb 12.8 oz (82 kg)   SpO2 95%   BMI 34.16 kg/m   BP Readings from Last 3 Encounters:  05/31/17 112/74  03/29/17 102/64  03/07/17 133/77    Wt Readings from Last 3 Encounters:  05/31/17 180 lb 12.8 oz (82 kg)  03/29/17 179 lb 9.6 oz (81.5 kg)  03/07/17 180 lb 9.6 oz (81.9 kg)    General appearance: alert, cooperative and appears stated age Ears: normal TM's and external ear canals both ears Throat: lips, mucosa, and tongue normal; teeth and gums normal Neck: no adenopathy, no carotid bruit, supple, symmetrical, trachea midline and thyroid not enlarged, symmetric, no tenderness/mass/nodules Back: symmetric,  no curvature. ROM normal. No CVA tenderness. Lungs: clear to auscultation bilaterally Heart: regular rate and rhythm, S1, S2 normal, no murmur, click, rub or gallop Abdomen: soft, non-tender; bowel sounds normal; no masses,  no organomegaly Pulses: 2+ and symmetric Skin: Skin color, texture, turgor normal. No rashes or lesions Lymph nodes: Cervical, supraclavicular, and axillary nodes normal.  Lab Results  Component Value Date   HGBA1C 4.8 12/23/2016   HGBA1C 4.8 04/10/2014    Lab Results  Component Value Date   CREATININE 0.75 12/23/2016    CREATININE 1.08 (H) 11/16/2016   CREATININE 0.73 12/25/2015    Lab Results  Component Value Date   WBC 8.2 11/16/2016   HGB 14.6 11/16/2016   HCT 41.0 11/16/2016   PLT 210 11/16/2016   GLUCOSE 96 12/23/2016   CHOL 227 (H) 12/23/2016   TRIG 95.0 12/23/2016   HDL 51.60 12/23/2016   LDLDIRECT 162.0 12/23/2016   LDLCALC 157 (H) 12/23/2016   ALT 21 12/23/2016   AST 18 12/23/2016   NA 140 12/23/2016   K 4.4 12/23/2016   CL 105 12/23/2016   CREATININE 0.75 12/23/2016   BUN 16 12/23/2016   CO2 28 12/23/2016   TSH 0.68 12/23/2016   HGBA1C 4.8 12/23/2016   MICROALBUR 0.7 12/23/2016    US Renal  Result Date: 02/24/2017 CLINICAL DATA:  Hydronephrosis secondary to urinary tract stones. History of lithotripsy in February 2018 EXAM: RENAL / URINARY TRACT ULTRASOUND COMPLETE COMPARISON:  KUB of January 24, 2017 FINDINGS: Right Kidney: Length: 10.4 cm. The renal cortical echotexture remains lower than that of the adjacent liver. There is a midpole nonobstructing stone measuring 6 mm in diameter. There is no hydronephrosis. Left Kidney: Length: 10.6 cm. The renal cortical echotexture is similar to that on the right. There is no hydronephrosis. Bladder: Appears normal for degree of bladder distention. IMPRESSION: No hydronephrosis. 6 mm nonobstructing midpole stone in the right kidney. Electronically Signed   By: David  Martinique M.D.   On: 02/24/2017 16:30    Assessment & Plan:   Problem List Items Addressed This Visit    Essential hypertension - Primary    Well controlled on current regimen. Renal function stable, no changes today. .lastcr      Relevant Orders   Comprehensive metabolic panel   Impaired fasting glucose   Relevant Orders   Hemoglobin A1c   Obesity    I spent 15 minutes addressing  BMI and recommended wt loss of 10% of body weigh over the next 6 months using a low glycemic index diet and regular exercise a minimum of 5 days per week. She has had difficulty losing weight due  to erratic eating habits .  Thyroid function is normal and there are no signs of diabetes on screening labs today        Insomnia    Suggested melatonin and tylenol PM Discussed natural remedies for insomnia including herbal tea and melatonin.  Reviewd principles of good sleep hygiene       Other Visit Diagnoses    Hyperlipidemia with target LDL less than 130       Relevant Orders   LDL cholesterol, direct   Lipid panel     A total of 25 minutes of face to face time was spent with patient more than half of which was spent in counselling about the above mentioned conditions  and coordination of care   I am having Ms. Vernier maintain her losartan, buPROPion, ARIPiprazole, and metoprolol succinate.  No orders  of the defined types were placed in this encounter.   Medications Discontinued During This Encounter  Medication Reason  . metoprolol succinate (TOPROL-XL) 25 MG 24 hr tablet Duplicate    Follow-up: Return in about 6 months (around 12/01/2017).   Crecencio Mc, MD

## 2017-05-31 NOTE — Patient Instructions (Addendum)
Losing weight shouldn't be this difficult!   Your eating habits are the problem.  You need to eat 6 small meals daily , not 2!!  Use the Premier protein shakes and the mini KIND bars as 2 of your skipped meals   For your sleep issues  Try taking tylenol PM at bedtime OR MELATONIN   RETURN FOR FASTING LABS SOMETIME THIS MONTH AND YOUR PNEUMONIA VACCINE

## 2017-06-01 DIAGNOSIS — G4700 Insomnia, unspecified: Secondary | ICD-10-CM | POA: Insufficient documentation

## 2017-06-01 NOTE — Assessment & Plan Note (Signed)
I spent 15 minutes addressing  BMI and recommended wt loss of 10% of body weigh over the next 6 months using a low glycemic index diet and regular exercise a minimum of 5 days per week. She has had difficulty losing weight due to erratic eating habits .  Thyroid function is normal and there are no signs of diabetes on screening labs today

## 2017-06-01 NOTE — Assessment & Plan Note (Signed)
Suggested melatonin and tylenol PM Discussed natural remedies for insomnia including herbal tea and melatonin.  Reviewd principles of good sleep hygiene

## 2017-06-01 NOTE — Assessment & Plan Note (Signed)
Well controlled on current regimen. Renal function stable, no changes today. .lastcr

## 2017-06-21 ENCOUNTER — Ambulatory Visit: Payer: PPO | Admitting: Licensed Clinical Social Worker

## 2017-06-22 ENCOUNTER — Other Ambulatory Visit: Payer: Self-pay | Admitting: Internal Medicine

## 2017-06-30 ENCOUNTER — Ambulatory Visit: Payer: PPO | Admitting: Licensed Clinical Social Worker

## 2017-07-05 ENCOUNTER — Ambulatory Visit: Payer: PPO | Admitting: Psychiatry

## 2017-07-17 ENCOUNTER — Encounter: Payer: Self-pay | Admitting: Internal Medicine

## 2017-07-18 ENCOUNTER — Other Ambulatory Visit: Payer: Self-pay | Admitting: Internal Medicine

## 2017-07-18 MED ORDER — LIRAGLUTIDE -WEIGHT MANAGEMENT 18 MG/3ML ~~LOC~~ SOPN: 0.6000 mg | PEN_INJECTOR | Freq: Every day | SUBCUTANEOUS | 0 refills | Status: DC

## 2017-07-19 ENCOUNTER — Ambulatory Visit (INDEPENDENT_AMBULATORY_CARE_PROVIDER_SITE_OTHER): Payer: PPO | Admitting: Psychiatry

## 2017-07-19 ENCOUNTER — Encounter: Payer: Self-pay | Admitting: Psychiatry

## 2017-07-19 ENCOUNTER — Ambulatory Visit: Payer: PPO | Admitting: Psychiatry

## 2017-07-19 ENCOUNTER — Ambulatory Visit (INDEPENDENT_AMBULATORY_CARE_PROVIDER_SITE_OTHER): Payer: PPO | Admitting: Licensed Clinical Social Worker

## 2017-07-19 VITALS — BP 134/84 | HR 85 | Temp 98.8°F | Wt 181.8 lb

## 2017-07-19 DIAGNOSIS — F3110 Bipolar disorder, current episode manic without psychotic features, unspecified: Secondary | ICD-10-CM | POA: Diagnosis not present

## 2017-07-19 MED ORDER — ARIPIPRAZOLE 2 MG PO TABS: 2.0000 mg | ORAL_TABLET | Freq: Every day | ORAL | 2 refills | Status: DC

## 2017-07-19 MED ORDER — BUPROPION HCL ER (XL) 150 MG PO TB24: 150.0000 mg | ORAL_TABLET | Freq: Every day | ORAL | 3 refills | Status: DC

## 2017-07-19 NOTE — Progress Notes (Signed)
   THERAPIST PROGRESS NOTE  Session Time:28min  Participation Level: Active  Behavioral Response: CasualAlertEuthymic  Type of Therapy: Individual Therapy  Treatment Goals addressed: Coping  Interventions: CBT, Motivational Interviewing and Solution Focused  Summary: Julia Cooke is a 66 y.o. female who presents with continued symptoms of her diagnosis.  Patient reports she is currently having an "fair" mood.  She reports that her family needs her.  She reports that her daughter will have gastric bypass this week.  She reports that she will help her daughter and her family out while she is in recovery.  She reports that she and her husband are currently shopping around for a house.  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 Patient reports that she is no longer searching for employment.  She denies using coping skills previously taught to reduce mood. Role played how to use 4,7 8 breathing exercise. Suicidal/Homicidal: No  Therapist Response: LCSW provided Patient with ongoing emotional support and encouragement.  Normalized her feelings.  Commended Patient on her progress and reinforced the importance of client staying focused on her own strengths and resources and resiliency. Processed various strategies for dealing with stressors.     Plan: Return again in2 weeks.  Diagnosis: Axis I: Bipolar    Axis II: No diagnosis    Lubertha South, LCSW 07/19/2017

## 2017-07-19 NOTE — Progress Notes (Signed)
Patient ID: Julia Cooke, female   DOB: December 17, 1950, 66 y.o.   MRN: [id number]   [facility] MD/PA/NP OP Progress Note  07/19/2017 1:38 PM Yasenia Reedy  MRN:  [id number]  Subjective:  Patient returns for follow-up of her bipolar disorder. Patient reports that she has been doing quite well on the wellbutrin.  Reports taking melatonin to help her sleep. States she has some anxiety regarding the family finances. Wakes up in the middle of the night and sometimes difficulty going back to sleep.Denies any suicidal thoughts.   Chief Complaint: Much improved Chief Complaint    Follow-up; Medication Refill     Visit Diagnosis:     ICD-10-CM   1. Bipolar disorder in remission F31.10     Past Medical History:  Past Medical History:  Diagnosis Date  . Bipolar disorder ([place])    takes Abilify  . Depression   . Dysrhythmia 10/2016   LBBB on ekg...nothing to compare it to.  Marland Kitchen Heart murmur   . Hypertension   . Nephrolithiasis 2000   s/p lithotripsy  . Psychotic disorder    mania    Past Surgical History:  Procedure Laterality Date  . EXTRACORPOREAL SHOCK WAVE LITHOTRIPSY Left 01/06/2017   Procedure: EXTRACORPOREAL SHOCK WAVE LITHOTRIPSY (ESWL);  Surgeon: Hollice Espy, MD;  Location: [facility] ORS;  Service: Urology;  Laterality: Left;  . LITHOTRIPSY  2000   Family History:  Family History  Problem Relation Age of Onset  . Hyperlipidemia Mother   . Stroke Mother   . Hypertension Mother   . Hyperlipidemia Father   . Hypertension Father   . Cancer Sister        breast  . Breast cancer Sister 62  . Heart disease Brother        myocardial infarction  . Heart attack Brother   . Hypertension Brother   . Fibromyalgia Sister   . Hypertension Sister   . Heart Problems Sister   . Hypertension Sister   . Prostate cancer Neg Hx   . Kidney cancer Neg Hx   . Bladder Cancer Neg Hx    Social History:  Social History   Social History  . Marital status: Married    Spouse name: N/A  . Number of  children: N/A  . Years of education: N/A   Social History Main Topics  . Smoking status: Never Smoker  . Smokeless tobacco: Never Used  . Alcohol use No     Comment: 2-3 times weekly  . Drug use: No  . Sexual activity: Yes    Birth control/ protection: Post-menopausal, None   Other Topics Concern  . None   Social History Narrative   Patient lives at home with her husband of 63 years. They have 3 adult children (2 boys, 1 girl). They recently moved back to [place] in January. 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   Additional History:   Assessment:   Musculoskeletal: Strength & Muscle Tone: within normal limits Gait & Station: normal Patient leans: N/A  Psychiatric Specialty Exam: Medication Refill   Depression         Associated symptoms include does not have insomnia and no suicidal ideas.   Review of Systems  Psychiatric/Behavioral: Negative for depression, hallucinations, memory loss, substance abuse and suicidal ideas. The patient is not nervous/anxious and does not have insomnia.   All other systems reviewed and are negative.   Blood  pressure 134/84, pulse 85, temperature 98.8 F (37.1 C), temperature source Oral, weight 181 lb 12.8 oz (82.5 kg).Body mass index is 34.35 kg/m.  General Appearance: Neat and Well Groomed  Eye Contact:  Good  Speech:  Normal Rate  Volume:  Normal  Mood:  good  Affect: Pleasant and smiling   Thought Process:  Linear  Orientation:  Full (Time, Place, and Person)  Thought Content:  Negative  Suicidal Thoughts:  No  Homicidal Thoughts:  No  Memory:  Immediate;   Good Recent;   Good Remote;   Good  Judgement:  Good  Insight:  Good  Psychomotor Activity:  Negative  Concentration:  Good  Recall:  Good  Fund of Knowledge: Good  Language: Good  Akathisia:  Negative  Handed:   AIMS (if indicated):    Assets:  Communication Skills Desire for Improvement   ADL's:  Intact  Cognition: WNL  Sleep:  fair   Is the patient at risk to self?  No. Has the patient been a risk to self in the past 6 months?  No. Has the patient been a risk to self within the distant past?  Yes.   Is the patient a risk to others?  No. Has the patient been a risk to others in the past 6 months?  No. Has the patient been a risk to others within the distant past?  No.  Current Medications: Current Outpatient Prescriptions  Medication Sig Dispense Refill  . ARIPiprazole (ABILIFY) 2 MG tablet Take 1 tablet (2 mg total) by mouth daily. 30 tablet 2  . buPROPion (WELLBUTRIN XL) 150 MG 24 hr tablet Take 1 tablet (150 mg total) by mouth daily. 30 tablet 3  . Liraglutide -Weight Management (SAXENDA) 18 MG/3ML SOPN Inject 0.6 mg into the skin daily. Increase dose weekly as follows: Week 2: 1.2 mg daily ; Week 3: 1.8 mg daily; Week 4: 2.4 mg daily 9 mL 0  . losartan (COZAAR) 25 MG tablet TAKE ONE TABLET BY MOUTH DAILY 90 tablet 0  . metoprolol succinate (TOPROL-XL) 25 MG 24 hr tablet TAKE ONE TABLET BY MOUTH DAILY 90 tablet 0   No current facility-administered medications for this visit.     Medical Decision Making:  Established Problem, Stable/Improving (1) and Review of Medication Regimen & Side Effects (2)  Treatment Plan Summary:Medication management and Plan   Bipolar disorder type I- In remission  Continue Abilifty at 2mg  po daily. Continue Wellbutrin at 150mg  po qd as prescribed by her Primary care physician. Continue with [facility] for therapy.  Anxiety-Same as above.  Return to clinic in 3 months time or call before if necessary.   Jizel Cheeks 07/19/2017, 1:38 PM

## 2017-07-25 DIAGNOSIS — D2261 Melanocytic nevi of right upper limb, including shoulder: Secondary | ICD-10-CM | POA: Diagnosis not present

## 2017-07-25 DIAGNOSIS — L237 Allergic contact dermatitis due to plants, except food: Secondary | ICD-10-CM | POA: Diagnosis not present

## 2017-07-25 DIAGNOSIS — Z85828 Personal history of other malignant neoplasm of skin: Secondary | ICD-10-CM | POA: Diagnosis not present

## 2017-07-25 DIAGNOSIS — D485 Neoplasm of uncertain behavior of skin: Secondary | ICD-10-CM | POA: Diagnosis not present

## 2017-07-25 DIAGNOSIS — D2272 Melanocytic nevi of left lower limb, including hip: Secondary | ICD-10-CM | POA: Diagnosis not present

## 2017-07-29 ENCOUNTER — Encounter: Payer: Self-pay | Admitting: Internal Medicine

## 2017-08-04 ENCOUNTER — Other Ambulatory Visit: Payer: Self-pay | Admitting: Internal Medicine

## 2017-09-06 IMAGING — CR DG ABDOMEN 1V
1 series · 2 of 2 positions shown · non-contrast
Comparison: 01/06/2017 KUB and earlier.

CLINICAL DATA: 66-year-old female status post lithotripsy on
01/06/2017, currently asymptomatic. Subsequent encounter.

EXAM:
ABDOMEN - 1 VIEW

[Series 1: dg abd 1 view · 0.14mm/px · 2 of 2 slices shown]
[im 1/2]
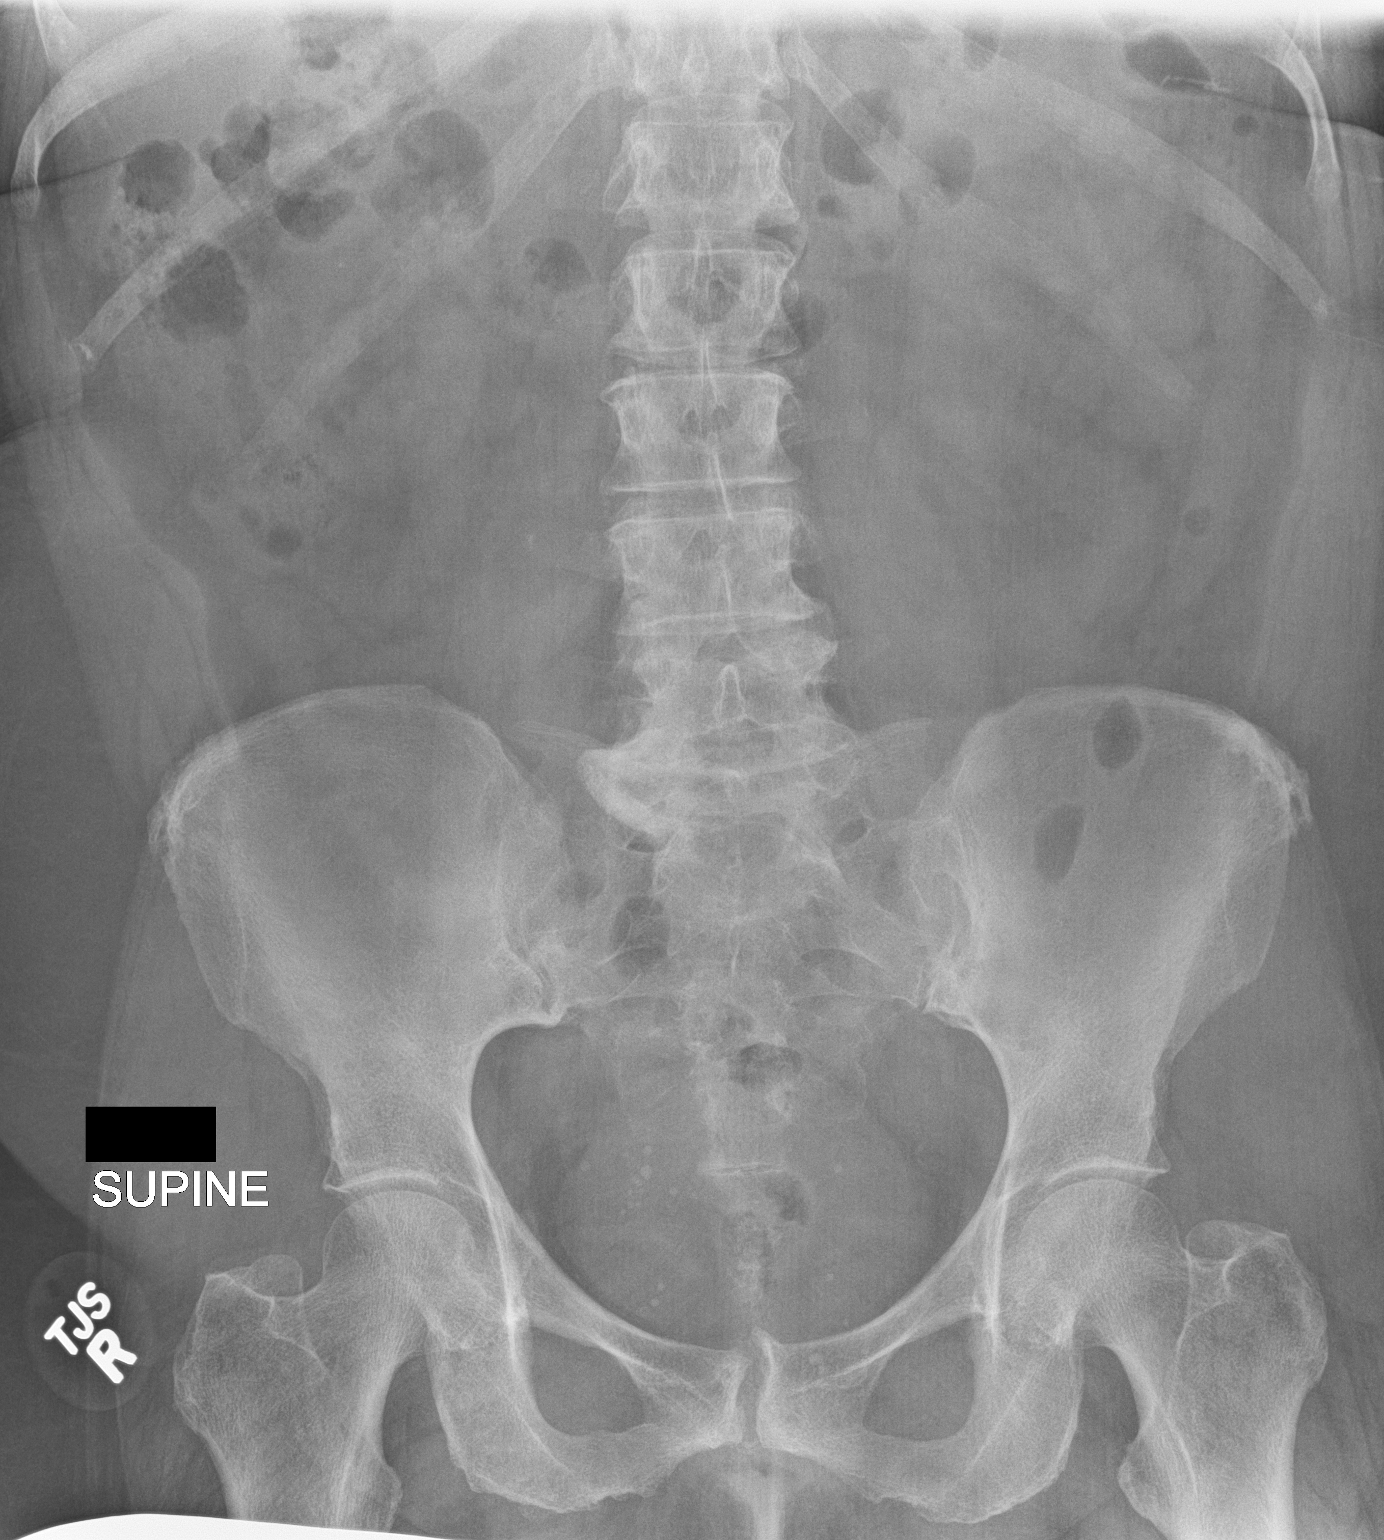
[im 2/2]
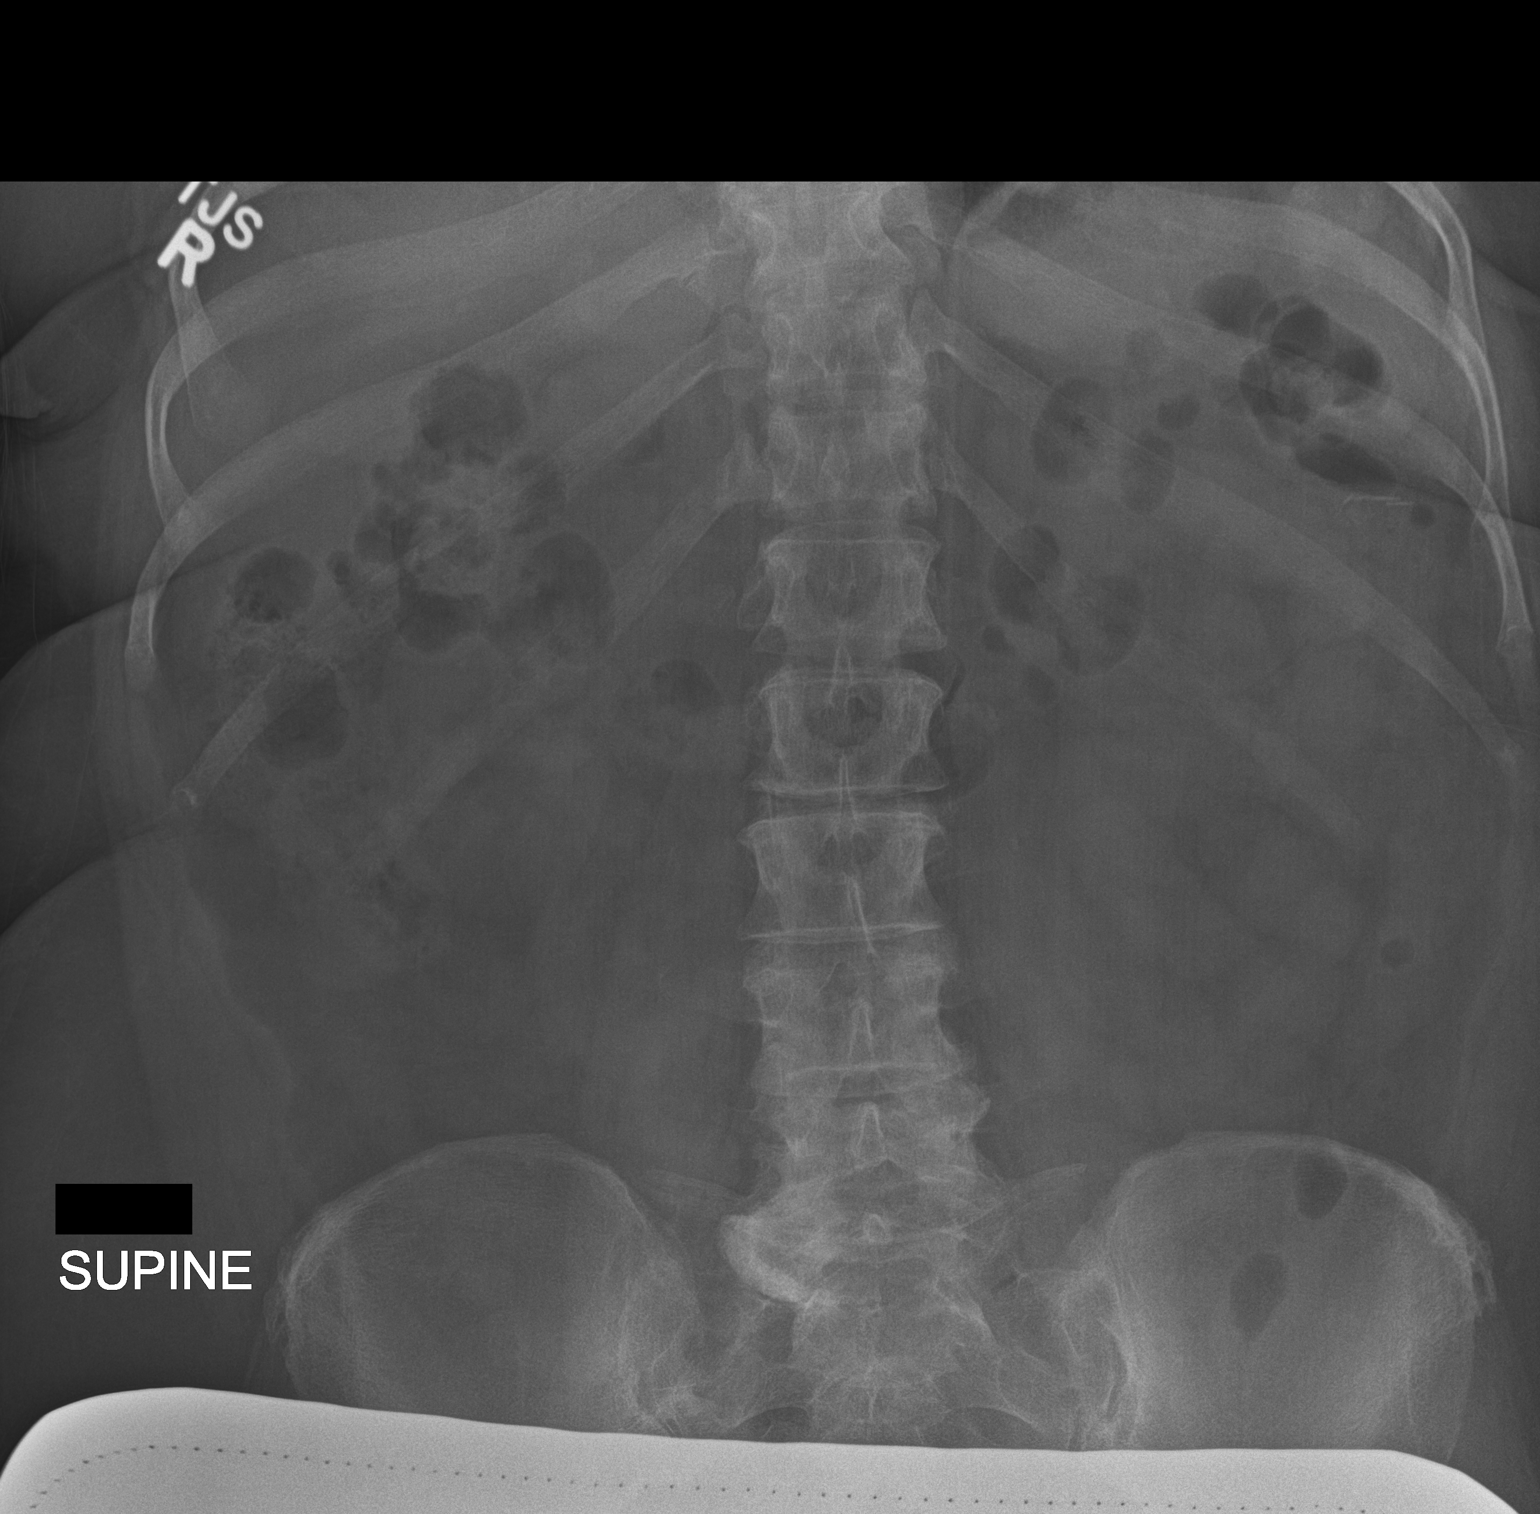

[2 of 2 positions shown; findings below may reference images not displayed]

FINDINGS: 2 supine views of the abdomen and pelvis. The previously-seen 8 x 4
mm distal left ureteral calculus is no longer evident. Small pelvic
phleboliths are stable. Superimposed nephrolithiasis seen in
[REDACTED] on CT Abdomen and Pelvis is not well visualized
radiographically. Normal bowel gas pattern. Chronic lower lumbar
endplate degeneration. No acute osseous abnormality identified.
IMPRESSION: 1. Resolved left ureterovesical junction calculus since 01/06/2017.
[DATE]. Small bilateral nephrolithiasis.

## 2017-09-23 ENCOUNTER — Other Ambulatory Visit: Payer: Self-pay | Admitting: Internal Medicine

## 2017-09-28 ENCOUNTER — Ambulatory Visit (INDEPENDENT_AMBULATORY_CARE_PROVIDER_SITE_OTHER): Payer: PPO

## 2017-09-28 DIAGNOSIS — Z23 Encounter for immunization: Secondary | ICD-10-CM | POA: Diagnosis not present

## 2017-10-18 ENCOUNTER — Ambulatory Visit: Payer: PPO | Admitting: Psychiatry

## 2017-10-18 ENCOUNTER — Other Ambulatory Visit: Payer: Self-pay

## 2017-10-18 ENCOUNTER — Ambulatory Visit (INDEPENDENT_AMBULATORY_CARE_PROVIDER_SITE_OTHER): Payer: PPO | Admitting: Licensed Clinical Social Worker

## 2017-10-18 ENCOUNTER — Encounter: Payer: Self-pay | Admitting: Psychiatry

## 2017-10-18 VITALS — BP 135/80 | HR 90 | Temp 98.1°F | Wt 178.0 lb

## 2017-10-18 DIAGNOSIS — F3110 Bipolar disorder, current episode manic without psychotic features, unspecified: Secondary | ICD-10-CM | POA: Diagnosis not present

## 2017-10-18 MED ORDER — ARIPIPRAZOLE 2 MG PO TABS: 2.0000 mg | ORAL_TABLET | Freq: Every day | ORAL | 2 refills | Status: DC

## 2017-10-18 NOTE — Progress Notes (Signed)
Patient ID: Julia Cooke, female   DOB: 09-21-1951, 66 y.o.   MRN: [id number]   [facility] MD/PA/NP OP Progress Note  10/18/2017 1:42 PM Lamonica Trueba  MRN:  [id number]  Subjective:  Patient returns for follow-up of her bipolar disorder. Patient reports she is doing ok overall. States she does get into the mood for the holidays since she has to cook for the family. Denies feeling depressed. Feels things are not going the way she wants them to, but cannot do anything because of her finances. Taking medications and denies any side effects.  Denies any suicidal thoughts. Chief Complaint: Doing ok.  Chief Complaint    Follow-up; Medication Refill     Visit Diagnosis:     ICD-10-CM   1. Bipolar disorder in remission F31.10     Past Medical History:  Past Medical History:  Diagnosis Date  . Bipolar disorder ([place])    takes Abilify  . Depression   . Dysrhythmia 10/2016   LBBB on ekg...nothing to compare it to.  Marland Kitchen Heart murmur   . Hypertension   . Nephrolithiasis 2000   s/p lithotripsy  . 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 EXTRACORPOREAL SHOCK WAVE LITHOTRIPSY Left 01/06/2017   Procedure: EXTRACORPOREAL SHOCK WAVE LITHOTRIPSY (ESWL);  Surgeon: Hollice Espy, MD;  Location: [facility] ORS;  Service: Urology;  Laterality: Left;  . LITHOTRIPSY  2000   Family History:  Family History  Problem Relation Age of Onset  . Hyperlipidemia Mother   . Stroke Mother   . Hypertension Mother   . Hyperlipidemia Father   . Hypertension Father   . Cancer Sister        breast  . Breast cancer Sister 46  . Heart disease Brother        myocardial infarction  . Heart attack Brother   . Hypertension Brother   . Fibromyalgia Sister   . Hypertension Sister   . Heart Problems Sister   . Hypertension Sister   . Prostate cancer Neg Hx   . Kidney cancer Neg Hx   . Bladder Cancer Neg Hx    Social History:  Social History   Socioeconomic History   . Marital status: Married    Spouse name: richard  . Number of children: 3  . Years of education: None  . Highest education level: Some college, no degree  Social Needs  . Financial resource strain: Not hard at all  . Food insecurity - worry: Never true  . Food insecurity - inability: Never true  . Transportation needs - medical: No  . Transportation needs - non-medical: No  Occupational History    Comment: retired  Tobacco Use  . Smoking status: Never Smoker  . Smokeless tobacco: Never Used  Substance and Sexual Activity  . Alcohol use: No    Alcohol/week: 0.0 - 1.2 oz    Comment: 2-3 times weekly  . Drug use: No  . Sexual activity: Yes    Birth control/protection: Post-menopausal, None  Other Topics Concern  . None  Social History Narrative   Patient lives at home with her husband of 8 years. They have 3 adult children (2 boys, 1 girl). They recently moved back to [place] in January. 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   Additional History:   Assessment:   Musculoskeletal: Strength & Muscle Tone: within  normal limits Gait & Station: normal Patient leans: N/A  Psychiatric Specialty Exam: Medication Refill   Depression         Associated symptoms include does not have insomnia and no suicidal ideas.   Review of Systems  Psychiatric/Behavioral: Negative for depression, hallucinations, memory loss, substance abuse and suicidal ideas. The patient is not nervous/anxious and does not have insomnia.   All other systems reviewed and are negative.   Blood pressure 135/80, pulse 90, temperature 98.1 F (36.7 C), temperature source Oral, weight 178 lb (80.7 kg).Body mass index is 33.63 kg/m.  General Appearance: Neat and Well Groomed  Eye Contact:  Good  Speech:  Normal Rate  Volume:  Normal  Mood:  ok  Affect: Pleasant and smiling   Thought Process:  Linear  Orientation:  Full (Time, Place,  and Person)  Thought Content:  Negative  Suicidal Thoughts:  No  Homicidal Thoughts:  No  Memory:  Immediate;   Good Recent;   Good Remote;   Good  Judgement:  Good  Insight:  Good  Psychomotor Activity:  Negative  Concentration:  Good  Recall:  Good  Fund of Knowledge: Good  Language: Good  Akathisia:  Negative  Handed:   AIMS (if indicated):    Assets:  Communication Skills Desire for Improvement  ADL's:  Intact  Cognition: WNL  Sleep:  fair   Is the patient at risk to self?  No. Has the patient been a risk to self in the past 6 months?  No. Has the patient been a risk to self within the distant past?  Yes.   Is the patient a risk to others?  No. Has the patient been a risk to others in the past 6 months?  No. Has the patient been a risk to others within the distant past?  No.  Current Medications: Current Outpatient Medications  Medication Sig Dispense Refill  . ARIPiprazole (ABILIFY) 2 MG tablet Take 1 tablet (2 mg total) by mouth daily. 30 tablet 2  . buPROPion (WELLBUTRIN XL) 150 MG 24 hr tablet Take 1 tablet (150 mg total) by mouth daily. 30 tablet 3  . Liraglutide -Weight Management (SAXENDA) 18 MG/3ML SOPN Inject 0.6 mg into the skin daily. Increase dose weekly as follows: Week 2: 1.2 mg daily ; Week 3: 1.8 mg daily; Week 4: 2.4 mg daily 9 mL 0  . losartan (COZAAR) 25 MG tablet Take 1 tablet (25 mg total) by mouth daily. Needs to schedule a follow-up prior to next refill. 90 tablet 0  . metoprolol succinate (TOPROL-XL) 25 MG 24 hr tablet TAKE ONE TABLET BY MOUTH DAILY 90 tablet 0   No current facility-administered medications for this visit.     Medical Decision Making:  Established Problem, Stable/Improving (1) and Review of Medication Regimen & Side Effects (2)  Treatment Plan Summary:Medication management and Plan   Bipolar disorder type I- In remission  Continue Abilifty at 2mg  po daily. Continue Wellbutrin at 150mg  po qd as prescribed by her Primary care  physician., check with PCP if her dose can be decreased. Continue with [facility] for therapy.  Anxiety-Same as above.  Return to clinic in 3 months time or call before if necessary.   Lowen Barringer 10/18/2017, 1:42 PM

## 2017-10-18 NOTE — Progress Notes (Signed)
   THERAPIST PROGRESS NOTE  Session Time: 16min  Participation Level: Active  Behavioral Response: Casual, Neat and Well GroomedAlertEuthymic  Type of Therapy: Individual Therapy  Treatment Goals addressed: Coping and Diagnosis: Bipolar  Interventions: CBT and Motivational Interviewing  Summary: Julia Cooke is a 66 y.o. female who presents with a reduction in her symptoms.  Patient reports a "blah" mood.  Patient reports that she is aggravated with life.  She reports that she wants more out of life.  She reports that she wants to stop renting and purchase a home.  She reports that the finances are a major discussion in the home. She reports that her husband wants to purchase a "fix me up" but Patient wants a larger home. Patient reports that she wants to obtain a home with her children. Patient reports that she will feel better emotionally after they decide on a home. She reports that she is trying to ensure her Grandson with Asperger's continues to progress and not settle.  She is currently encouraging him to go to college. Patient reports that she wants to go back to work to assist the family financially but she does not have the desire/drive to work daily.  Reports that she feels depressed and "unhappy". Patient was able to list what "bipolar" and how it is present in her life. She reports that she wants to be able to improve her motivation but at the end of the conversation she state "I am [name]" with my decisions. Factors that contribute to client's ongoing depressive symptoms were discussed and include real and perceived feelings of isolation, criticism, rejection, shame and guilt.]  Suicidal/Homicidal: No  Therapist Response: Therapist actively listened as Patient reported current mood and discussed stressors. Processed with patient about her symptoms and behaviors.  Assisted Patient with understanding her diagnosis.  Assisted patient with understanding her goals and understanding  that she has to make progress. Explored with Patient what "being happy is".   Plan: Return again in 4 weeks.  Diagnosis: Axis I: Bipolar, Depressed    Axis II: No diagnosis    Lubertha South, LCSW 10/18/2017

## 2017-10-23 ENCOUNTER — Other Ambulatory Visit: Payer: Self-pay | Admitting: Psychiatry

## 2017-10-23 ENCOUNTER — Other Ambulatory Visit: Payer: Self-pay | Admitting: Internal Medicine

## 2017-10-25 NOTE — Telephone Encounter (Signed)
  RECEIVED A FAX REQUESTING A REFILL ON WELLBUTRIN 150MG  PT WAS LAST SEEN ON  10-18-17 NEXT APPT  01-17-18    buPROPion (WELLBUTRIN XL) 150 MG 24 hr tablet  Medication  Date: 07/19/2017 Department: [facility] Ordering/Authorizing: Elvin So, MD  Order Providers   Prescribing Provider Encounter Provider  Elvin So, MD Elvin So, MD  Medication Detail    Disp Refills Start End   buPROPion (WELLBUTRIN XL) 150 MG 24 hr tablet 30 tablet 3 07/19/2017    Sig - Route: Take 1 tablet (150 mg total) by mouth daily. - Oral   Sent to pharmacy as: buPROPion (WELLBUTRIN XL) 150 MG 24 hr tablet   E-Prescribing Status: Receipt confirmed by pharmacy (07/19/2017 1:43 PM EDT)

## 2017-11-02 ENCOUNTER — Telehealth: Payer: Self-pay

## 2017-11-02 NOTE — Telephone Encounter (Signed)
Ok please call in

## 2017-11-02 NOTE — Telephone Encounter (Signed)
received a request for a 30 day supply with refills for pt on the bupropion hcl xl 150mg     buPROPion (WELLBUTRIN XL) 150 MG 24 hr tablet 7 tablet 2 10/25/2017    Sig: TAKE ONE TABLET BY MOUTH DAILY   Sent to pharmacy as: buPROPion (WELLBUTRIN XL) 150 MG 24 hr tablet   E-Prescribing Status: Receipt confirmed by pharmacy (10/25/2017 3:37 PM EST)     It looks as if only 7 tablets were given instead of thirty.

## 2017-11-03 NOTE — Telephone Encounter (Signed)
Called pharmacy and left message on doctor's line for 30 day supply with 2 refills per dr. Einar Grad ok

## 2017-11-17 ENCOUNTER — Ambulatory Visit: Payer: PPO | Admitting: Licensed Clinical Social Worker

## 2018-01-17 ENCOUNTER — Other Ambulatory Visit: Payer: Self-pay

## 2018-01-17 ENCOUNTER — Encounter: Payer: Self-pay | Admitting: Psychiatry

## 2018-01-17 ENCOUNTER — Ambulatory Visit (INDEPENDENT_AMBULATORY_CARE_PROVIDER_SITE_OTHER): Payer: PPO | Admitting: Psychiatry

## 2018-01-17 VITALS — BP 147/80 | HR 87 | Temp 98.8°F | Wt 182.6 lb

## 2018-01-17 DIAGNOSIS — F3110 Bipolar disorder, current episode manic without psychotic features, unspecified: Secondary | ICD-10-CM

## 2018-01-17 MED ORDER — ARIPIPRAZOLE 5 MG PO TABS: 5.0000 mg | ORAL_TABLET | Freq: Every day | ORAL | 1 refills | Status: DC

## 2018-01-17 NOTE — Progress Notes (Signed)
Patient ID: Julia Cooke, female   DOB: [birth year], 67 y.o.   MRN: [id number]   [facility] MD/PA/NP OP Progress Note  01/17/2018 1:34 PM Julia Cooke  MRN:  [id number]  Subjective:  Patient returns for follow-up of her bipolar disorder. Patient reports she is doing ok overall except for her sleep. States she has trouble staying asleep. She wakes up once or twice during the night. Mood is okay, states it has been up and down. Sattes she works very hard to stay in a good mood. They continue to have financial issues. We discussed going up on the Abilify to 5 mg. Patient would like to give it a try. She has failed several antidepressants before. She is being given the Wellbutrin by her primary care physician.  Denies any suicidal thoughts.  Chief Complaint: Doing ok.  Chief Complaint    Follow-up; Medication Refill     Visit Diagnosis:     ICD-10-CM   1. Bipolar disorder in remission F31.10     Past Medical History:  Past Medical History:  Diagnosis Date  . Bipolar disorder ([place])    takes Abilify  . Depression   . Dysrhythmia 10/2016   LBBB on ekg...nothing to compare it to.  Marland Kitchen Heart murmur   . Hypertension   . Nephrolithiasis 2000   s/p lithotripsy  . Psychotic disorder ([place])    mania    Past Surgical History:  Procedure Laterality Date  . EXTRACORPOREAL SHOCK WAVE LITHOTRIPSY Left 01/06/2017   Procedure: EXTRACORPOREAL SHOCK WAVE LITHOTRIPSY (ESWL);  Surgeon: Hollice Espy, MD;  Location: [facility] ORS;  Service: Urology;  Laterality: Left;  . LITHOTRIPSY  2000   Family History:  Family History  Problem Relation Age of Onset  . Hyperlipidemia Mother   . Stroke Mother   . Hypertension Mother   . Hyperlipidemia Father   . Hypertension Father   . Cancer Sister        breast  . Breast cancer Sister 82  . Heart disease Brother        myocardial infarction  . Heart attack Brother   . Hypertension Brother   . Fibromyalgia Sister   . Hypertension Sister   . Heart Problems Sister    . Hypertension Sister   . Prostate cancer Neg Hx   . Kidney cancer Neg Hx   . Bladder Cancer Neg Hx    Social History:  Social History   Socioeconomic History  . Marital status: Married    Spouse name: richard  . Number of children: 3  . Years of education: None  . Highest education level: Some college, no degree  Social Needs  . Financial resource strain: Not hard at all  . Food insecurity - worry: Never true  . Food insecurity - inability: Never true  . Transportation needs - medical: No  . Transportation needs - non-medical: No  Occupational History    Comment: retired  Tobacco Use  . Smoking status: Never Smoker  . Smokeless tobacco: Never Used  Substance and Sexual Activity  . Alcohol use: No    Alcohol/week: 0.0 - 1.2 oz    Comment: 2-3 times weekly  . Drug use: No  . Sexual activity: Yes    Birth control/protection: Post-menopausal, None  Other Topics Concern  . None  Social History Narrative   Patient lives at home with her husband of 14 years. They have 3 adult children (2 boys, 1 girl). They recently moved back to [place] in January. 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 Patient has also worked as a Scientist, research (medical).   Additional History:   Assessment:   Musculoskeletal: Strength & Muscle Tone: within normal limits Gait & Station: normal Patient leans: N/A  Psychiatric Specialty Exam: Medication Refill   Depression         Associated symptoms include does not have insomnia and no suicidal ideas.   Review of Systems  Psychiatric/Behavioral: Negative for depression, hallucinations, memory loss, substance abuse and suicidal ideas. The patient is not nervous/anxious and does not have insomnia.   All other systems reviewed and are negative.   Blood pressure (!) 147/80, pulse 87, temperature 98.8 F (37.1 C), temperature source Oral, weight 82.8 kg (182 lb 9.6 oz).Body mass index is 34.5 kg/m.  General Appearance: Neat and  Well Groomed  Eye Contact:  Good  Speech:  Normal Rate  Volume:  Normal  Mood:  Moody, up and down  Affect: Pleasant and smiling   Thought Process:  Linear  Orientation:  Full (Time, Place, and Person)  Thought Content:  Negative  Suicidal Thoughts:  No  Homicidal Thoughts:  No  Memory:  Immediate;   Good Recent;   Good Remote;   Good  Judgement:  Good  Insight:  Good  Psychomotor Activity:  Negative  Concentration:  Good  Recall:  Good  Fund of Knowledge: Good  Language: Good  Akathisia:  Negative  Handed:   AIMS (if indicated):    Assets:  Communication Skills Desire for Improvement  ADL's:  Intact  Cognition: WNL  Sleep:  fair   Is the patient at risk to self?  No. Has the patient been a risk to self in the past 6 months?  No. Has the patient been a risk to self within the distant past?  Yes.   Is the patient a risk to others?  No. Has the patient been a risk to others in the past 6 months?  No. Has the patient been a risk to others within the distant past?  No.  Current Medications: Current Outpatient Medications  Medication Sig Dispense Refill  . ARIPiprazole (ABILIFY) 2 MG tablet TAKE ONE TABLET BY MOUTH DAILY 7 tablet 1  . buPROPion (WELLBUTRIN XL) 150 MG 24 hr tablet TAKE ONE TABLET BY MOUTH DAILY 7 tablet 2  . Liraglutide -Weight Management (SAXENDA) 18 MG/3ML SOPN Inject 0.6 mg into the skin daily. Increase dose weekly as follows: Week 2: 1.2 mg daily ; Week 3: 1.8 mg daily; Week 4: 2.4 mg daily 9 mL 0  . losartan (COZAAR) 25 MG tablet Take 1 tablet (25 mg total) by mouth daily. Needs to schedule a follow-up prior to next refill. 90 tablet 0  . metoprolol succinate (TOPROL-XL) 25 MG 24 hr tablet TAKE ONE TABLET BY MOUTH DAILY 90 tablet 1   No current facility-administered medications for this visit.     Medical Decision Making:  Established Problem, Stable/Improving (1) and Review of Medication Regimen & Side Effects (2)  Treatment Plan Summary:Medication  management and Plan   Bipolar disorder type I- depressed mood  Increase Abilifty to 5mg  po daily. Continue Wellbutrin at 150mg  po qd as prescribed by her Primary care physician., check with PCP if her dose can be decreased. Continue with [facility] for therapy.  Obtained labs- a lab slip given to obtain lipid profile, CBC, CMP, prolactin and hemoglobin A1c  Anxiety-Same as above.  Return to clinic in 1 months time or  call before if necessary.   Julia Cooke 01/17/2018, 1:34 PM

## 2018-01-20 ENCOUNTER — Other Ambulatory Visit
Admission: RE | Admit: 2018-01-20 | Discharge: 2018-01-20 | Disposition: A | Discharge status: Home / Self Care | Payer: PPO | Source: Ambulatory Visit | Attending: Psychiatry | Admitting: Psychiatry

## 2018-01-20 ENCOUNTER — Telehealth: Payer: Self-pay

## 2018-01-20 DIAGNOSIS — F3110 Bipolar disorder, current episode manic without psychotic features, unspecified: Secondary | ICD-10-CM | POA: Insufficient documentation

## 2018-01-20 LAB — COMPREHENSIVE METABOLIC PANEL
ALBUMIN: 4.1 g/dL (ref 3.5–5.0)
ALT: 24 U/L (ref 14–54)
ANION GAP: 9 (ref 5–15)
AST: 28 U/L (ref 15–41)
Alkaline Phosphatase: 69 U/L (ref 38–126)
BUN: 19 mg/dL (ref 6–20)
CHLORIDE: 106 mmol/L (ref 101–111)
CO2: 23 mmol/L (ref 22–32)
Calcium: 9.3 mg/dL (ref 8.9–10.3)
Creatinine, Ser: 0.96 mg/dL (ref 0.44–1.00)
GFR calc non Af Amer: >60 mL/min (ref >60)
GLUCOSE: 104 mg/dL — AB (ref 65–99)
POTASSIUM: 4 mmol/L (ref 3.5–5.1)
SODIUM: 138 mmol/L (ref 135–145)
Total Bilirubin: 1.1 mg/dL (ref 0.3–1.2)
Total Protein: 7.5 g/dL (ref 6.5–8.1)

## 2018-01-20 LAB — CBC WITH DIFFERENTIAL/PLATELET
BASOS PCT: 0 %
Basophils Absolute: 0 10*3/uL (ref 0–0.1)
EOS ABS: 0.1 10*3/uL (ref 0–0.7)
EOS PCT: 2 %
HCT: 44.6 % (ref 35.0–47.0)
HEMOGLOBIN: 15.3 g/dL (ref 12.0–16.0)
Lymphocytes Relative: 23 %
Lymphs Abs: 1.7 10*3/uL (ref 1.0–3.6)
MCH: 30.7 pg (ref 26.0–34.0)
MCHC: 34.3 g/dL (ref 32.0–36.0)
MCV: 89.5 fL (ref 80.0–100.0)
MONOS PCT: 8 %
Monocytes Absolute: 0.6 10*3/uL (ref 0.2–0.9)
Neutro Abs: 4.8 10*3/uL (ref 1.4–6.5)
Neutrophils Relative %: 67 %
PLATELETS: 232 10*3/uL (ref 150–440)
RBC: 4.99 MIL/uL (ref 3.80–5.20)
RDW: 12.4 % (ref 11.5–14.5)
WBC: 7.2 10*3/uL (ref 3.6–11.0)

## 2018-01-20 LAB — LIPID PANEL
Cholesterol: 221 mg/dL — ABNORMAL HIGH (ref 0–200)
HDL: 53 mg/dL (ref >40)
LDL Cholesterol: 142 mg/dL — ABNORMAL HIGH (ref 0–99)
Total CHOL/HDL Ratio: 4.2 RATIO
Triglycerides: 131 mg/dL (ref <150)
VLDL: 26 mg/dL (ref 0–40)

## 2018-01-20 NOTE — Telephone Encounter (Signed)
received approval notice for medications.

## 2018-01-20 NOTE — Telephone Encounter (Signed)
recieved a fax from [facility] in regards to need a prior auth for vyvanse 70mg  capsule

## 2018-01-20 NOTE — Telephone Encounter (Signed)
went online to [email or web address] and submited prior auth info.

## 2018-01-20 NOTE — Telephone Encounter (Signed)
faxed and confirmed pa notice to the pharmacy. faxed too [phone]

## 2018-01-21 LAB — HEMOGLOBIN A1C
Hgb A1c MFr Bld: 4.9 % (ref 4.8–5.6)
Mean Plasma Glucose: 94 mg/dL

## 2018-01-21 LAB — T4: T4 TOTAL: 5.1 ug/dL (ref 4.5–12.0)

## 2018-01-21 LAB — PROLACTIN: PROLACTIN: 3.2 ng/mL — AB (ref 4.8–23.3)

## 2018-02-14 ENCOUNTER — Ambulatory Visit (INDEPENDENT_AMBULATORY_CARE_PROVIDER_SITE_OTHER): Payer: PPO | Admitting: Psychiatry

## 2018-02-14 ENCOUNTER — Encounter: Payer: Self-pay | Admitting: Psychiatry

## 2018-02-14 ENCOUNTER — Other Ambulatory Visit: Payer: Self-pay

## 2018-02-14 ENCOUNTER — Ambulatory Visit: Payer: PPO | Admitting: Licensed Clinical Social Worker

## 2018-02-14 ENCOUNTER — Ambulatory Visit: Payer: PPO | Admitting: Psychiatry

## 2018-02-14 VITALS — BP 151/88 | HR 77 | Temp 97.6°F | Wt 187.0 lb

## 2018-02-14 DIAGNOSIS — F3110 Bipolar disorder, current episode manic without psychotic features, unspecified: Secondary | ICD-10-CM | POA: Diagnosis not present

## 2018-02-14 MED ORDER — ARIPIPRAZOLE 5 MG PO TABS: 5.0000 mg | ORAL_TABLET | Freq: Every day | ORAL | 2 refills | Status: DC

## 2018-02-14 NOTE — Progress Notes (Signed)
Patient ID: Julia Cooke, female   DOB: [birth year], 67 y.o.   MRN: [id number]   [facility] MD/PA/NP OP Progress Note  02/14/2018 2:09 PM Julia Cooke  MRN:  [id number]  Subjective:  Patient returns for follow-up of her bipolar disorder. Patient reports she has tolerated the Abilify well at 5 mg. Denies any side effects. States that her mood seems to be more stable and she doesn't feel like she is as anxious.. Reports her sleep has also improved. Denies any suicidal thoughts. She has failed several antidepressants before. She is being given the Wellbutrin by her primary care physician.  We discussed that she would transition to Dr. Shea Evans at this practice at next visit .Patient is okay with this plan.  Denies any suicidal thoughts.  Chief Complaint: Doing better  Chief Complaint    Follow-up; Medication Refill     Visit Diagnosis:     ICD-10-CM   1. Bipolar disorder in remission F31.10     Past Medical History:  Past Medical History:  Diagnosis Date  . Bipolar disorder ([place])    takes Abilify  . Depression   . Dysrhythmia 10/2016   LBBB on ekg...nothing to compare it to.  Marland Kitchen Heart murmur   . Hypertension   . Nephrolithiasis 2000   s/p lithotripsy  . 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 EXTRACORPOREAL SHOCK WAVE LITHOTRIPSY Left 01/06/2017   Procedure: EXTRACORPOREAL SHOCK WAVE LITHOTRIPSY (ESWL);  Surgeon: Hollice Espy, MD;  Location: [facility] ORS;  Service: Urology;  Laterality: Left;  . LITHOTRIPSY  2000   Family History:  Family History  Problem Relation Age of Onset  . Hyperlipidemia Mother   . Stroke Mother   . Hypertension Mother   . Hyperlipidemia Father   . Hypertension Father   . Cancer Sister        breast  . Breast cancer Sister 98  . Heart disease Brother        myocardial infarction  . Heart attack Brother   . Hypertension Brother   . Fibromyalgia Sister   . Hypertension Sister   . Heart Problems Sister    . Hypertension Sister   . Prostate cancer Neg Hx   . Kidney cancer Neg Hx   . Bladder Cancer Neg Hx    Social History:  Social History   Socioeconomic History  . Marital status: Married    Spouse name: richard  . Number of children: 3  . Years of education: None  . Highest education level: Some college, no degree  Social Needs  . Financial resource strain: Not hard at all  . Food insecurity - worry: Never true  . Food insecurity - inability: Never true  . Transportation needs - medical: No  . Transportation needs - non-medical: No  Occupational History    Comment: retired  Tobacco Use  . Smoking status: Never Smoker  . Smokeless tobacco: Never Used  Substance and Sexual Activity  . Alcohol use: No    Alcohol/week: 0.0 - 1.2 oz    Comment: 2-3 times weekly  . Drug use: No  . Sexual activity: Yes    Birth control/protection: Post-menopausal, None  Other Topics Concern  . None  Social History Narrative   Patient lives at home with her husband of 96 years. They have 3 adult children (2 boys, 1 girl). They recently moved back to [place] in January. Prior to that, she was on the road  with her husband who remodels hotels. Patient has also worked as a Scientist, research (medical).   Additional History:   Assessment:   Musculoskeletal: Strength & Muscle Tone: within normal limits Gait & Station: normal Patient leans: N/A  Psychiatric Specialty Exam: Medication Refill   Depression         Associated symptoms include does not have insomnia and no suicidal ideas.   Review of Systems  Psychiatric/Behavioral: Negative for depression, hallucinations, memory loss, substance abuse and suicidal ideas. The patient is not nervous/anxious and does not have insomnia.   All other systems reviewed and are negative.   Blood pressure (!) 151/88, pulse 77, temperature 97.6 F (36.4 C), temperature source Oral, weight 84.8 kg (187 lb).Body mass index is 35.33 kg/m.  General Appearance: Neat and Well  Groomed  Eye Contact:  Good  Speech:  Normal Rate  Volume:  Normal  Mood:  improved  Affect: Pleasant   Thought Process:  Linear  Orientation:  Full (Time, Place, and Person)  Thought Content:  Negative  Suicidal Thoughts:  No  Homicidal Thoughts:  No  Memory:  Immediate;   Good Recent;   Good Remote;   Good  Judgement:  Good  Insight:  Good  Psychomotor Activity:  Negative  Concentration:  Good  Recall:  Good  Fund of Knowledge: Good  Language: Good  Akathisia:  Negative  Handed:   AIMS (if indicated):    Assets:  Communication Skills Desire for Improvement  ADL's:  Intact  Cognition: WNL  Sleep:  fair   Is the patient at risk to self?  No. Has the patient been a risk to self in the past 6 months?  No. Has the patient been a risk to self within the distant past?  Yes.   Is the patient a risk to others?  No. Has the patient been a risk to others in the past 6 months?  No. Has the patient been a risk to others within the distant past?  No.  Current Medications: Current Outpatient Medications  Medication Sig Dispense Refill  . ARIPiprazole (ABILIFY) 5 MG tablet Take 1 tablet (5 mg total) by mouth daily. 30 tablet 1  . buPROPion (WELLBUTRIN XL) 150 MG 24 hr tablet TAKE ONE TABLET BY MOUTH DAILY 7 tablet 2  . Liraglutide -Weight Management (SAXENDA) 18 MG/3ML SOPN Inject 0.6 mg into the skin daily. Increase dose weekly as follows: Week 2: 1.2 mg daily ; Week 3: 1.8 mg daily; Week 4: 2.4 mg daily 9 mL 0  . losartan (COZAAR) 25 MG tablet Take 1 tablet (25 mg total) by mouth daily. Needs to schedule a follow-up prior to next refill. 90 tablet 0  . metoprolol succinate (TOPROL-XL) 25 MG 24 hr tablet TAKE ONE TABLET BY MOUTH DAILY 90 tablet 1   No current facility-administered medications for this visit.     Medical Decision Making:  Established Problem, Stable/Improving (1) and Review of Medication Regimen & Side Effects (2)  Treatment Plan Summary:Medication management and  Plan   Bipolar disorder type I  Continue Abilifty at 5mg  po daily. Continue Wellbutrin at 150mg  po qd as prescribed by her Primary care physician., check with PCP if her dose can be decreased. Continue with [facility] for therapy.  Obtained labs- lipid profile wnl except for cholesterol at 221 and LDL at 141, CBC, CMP, prolactin and hemoglobin A1c wnl  Anxiety-Same as above.  Return to clinic in 2 months time, will follow up(transition care) with Dr. Shea Evans.  Call before if necessary.   Julia Cooke 02/14/2018, 2:09 PM

## 2018-02-16 ENCOUNTER — Telehealth: Payer: Self-pay | Admitting: Urology

## 2018-02-16 NOTE — Telephone Encounter (Signed)
You wanted this patient to come back in a year with a KUB prior Her app is on 03-07-18 but there is not an order for it. Just need KUB order  Thanks, [name]

## 2018-02-20 ENCOUNTER — Other Ambulatory Visit: Payer: Self-pay | Admitting: Urology

## 2018-02-20 DIAGNOSIS — R109 Unspecified abdominal pain: Secondary | ICD-10-CM

## 2018-02-20 NOTE — Progress Notes (Unsigned)
KUB order is in .  

## 2018-03-02 ENCOUNTER — Telehealth: Payer: Self-pay | Admitting: Internal Medicine

## 2018-03-02 NOTE — Telephone Encounter (Signed)
Looking in the pt's chart this medication was most recently filled by a different doctor but has been filled by you in the past. Pt also stated that you put her on this medication. Is it okay to refill?   Last OV: 05/31/2017 Next OV: not scheduled

## 2018-03-02 NOTE — Telephone Encounter (Signed)
Patient is seeing a psychiatrist and her wellbutrin and abilify should  be coming from him because I have not seen her since last July

## 2018-03-02 NOTE — Telephone Encounter (Signed)
Copied from [place] [phone]. Topic: Quick Communication - Rx Refill/Question >> Mar 02, 2018 11:05 AM Ahmed Prima L wrote: Medication: buPROPion (WELLBUTRIN XL) 150 MG 24 hr tablet Has the patient contacted their pharmacy? Yes & they told her she could only have 7 pills.  (Agent: If no, request that the patient contact the pharmacy for the refill.) Preferred Pharmacy (with phone number or street name): [place], [place] Agent: Please be advised that RX refills may take up to 3 business days. We ask that you follow-up with your pharmacy.

## 2018-03-02 NOTE — Telephone Encounter (Signed)
fyi

## 2018-03-02 NOTE — Telephone Encounter (Signed)
Called pt. About Wellbutrin refill because another provider's name is on record. She reports Dr. Derrel Nip has her on this medication.

## 2018-03-02 NOTE — Telephone Encounter (Signed)
Spoke with pt and informed her of Dr. Lupita Dawn message below. Pt stated that she would call her psychiatrist to get a refill.

## 2018-03-03 ENCOUNTER — Telehealth: Payer: Self-pay

## 2018-03-03 NOTE — Telephone Encounter (Signed)
Pt called left message that she needed refill on wellbutrin xl 150mg    Pt last seen dr. Einar Grad on  02-14-18 but at next visit will be seeing dr. Shea Evans on  05-02-18 . Pt needs enough medication to get to that appt.    buPROPion (WELLBUTRIN XL) 150 MG 24 hr tablet  Medication  Date: 10/25/2017 Department: [facility] Ordering/Authorizing: Elvin So, MD  Order Providers   Prescribing Provider Encounter Provider  Elvin So, MD Elvin So, MD  Outpatient Medication Detail    Disp Refills Start End   buPROPion (WELLBUTRIN XL) 150 MG 24 hr tablet 7 tablet 2 10/25/2017    Sig: TAKE ONE TABLET BY MOUTH DAILY   Sent to pharmacy as: buPROPion (WELLBUTRIN XL) 150 MG 24 hr tablet   E-Prescribing Status: Receipt confirmed by pharmacy (10/25/2017 3:37 PM EST)

## 2018-03-03 NOTE — Telephone Encounter (Signed)
Per Dr.Ravi's notes - PMD is giving wellbutrin and pt was given instructions to check with PMD for dosage changes. Please let pt know. I am not familiar with pt , will need to verify first with Dr.Ravi first. I will send her a message .

## 2018-03-06 NOTE — Progress Notes (Signed)
03/07/2018 1:33 PM   Julia Cooke September 06, 1951 [id number]  Referring provider: Crecencio Mc, MD [address] [place], [place] [address]  Chief Complaint  Patient presents with  . Nephrolithiasis    HPI: 67 yo WF with a history of nephrolithiasis and a history of hematuria who presents for a one year follow up.    Background history 67 yo WF who presents today for a follow up after undergoing ESWL on 01/06/2017 for a left 8 mm UVJ stone.  Contrast CT performed on 11/16/2016 noted an 8 mm left UVJ stone with mild left hydronephrosis. Correlation with urinalysis recommended to exclude superimposed UTI. Multiple smaller and nonobstructing left renal calculi as well as tiny right renal stone noted. No hydronephrosis on the right. Sigmoid diverticulosis. No evidence of bowel obstruction or active inflammation. Normal appendix.  Second stone event.  Last event 14 years ago treated with laser lithotripsy.  KUB taken on 01/24/2017 (today) notes resolved left ureterovesical junction calculus since 01/06/2017.  Small bilateral nephrolithiasis.  Her post procedural course was uneventful and as expected.  Today, she is not having flank pain or gross hematuria.  She has not had fevers, chills, nausea or vomiting.     Stone composition was calcium phosphate.    RUS performed on 02/24/2017 noted no hydronephrosis. 6 mm nonobstructing midpole stone in the right kidney.  Her UA last April was negative for [facility].    KUB taken on 03/07/2018 was negative for stones.  Her UA is negative.    Today, she is not experiencing flank pain or gross hematuria.  Patient denies any gross hematuria, dysuria or suprapubic/flank pain.  Patient denies any fevers, chills, nausea or vomiting.    PMH: Past Medical History:  Diagnosis Date  . Bipolar disorder ([place])    takes Abilify  . Depression   . Dysrhythmia 10/2016   LBBB on ekg...nothing to compare it to.  Marland Kitchen Heart murmur   . Hypertension   .  Nephrolithiasis 2000   s/p lithotripsy  . Psychotic disorder ([place])    mania    Surgical History: Past Surgical History:  Procedure Laterality Date  . EXTRACORPOREAL SHOCK WAVE LITHOTRIPSY Left 01/06/2017   Procedure: EXTRACORPOREAL SHOCK WAVE LITHOTRIPSY (ESWL);  Surgeon: Hollice Espy, MD;  Location: [facility] ORS;  Service: Urology;  Laterality: Left;  . LITHOTRIPSY  2000    Home Medications:  Allergies as of 03/07/2018      Reactions   Penicillins Swelling   As a child      Medication List        Accurate as of 03/07/18  1:33 PM. Always use your most recent med list.          ARIPiprazole 5 MG tablet Commonly known as:  ABILIFY Take 1 tablet (5 mg total) by mouth daily.   buPROPion 150 MG 24 hr tablet Commonly known as:  WELLBUTRIN XL Take 1 tablet (150 mg total) by mouth daily.   losartan 25 MG tablet Commonly known as:  COZAAR Take 1 tablet (25 mg total) by mouth daily. Needs to schedule a follow-up prior to next refill.   metoprolol succinate 25 MG 24 hr tablet Commonly known as:  TOPROL-XL TAKE ONE TABLET BY MOUTH DAILY       Allergies:  Allergies  Allergen Reactions  . Penicillins Swelling    As a child    Family History: Family History  Problem Relation Age of Onset  . Hyperlipidemia Mother   . Stroke  Mother   . Hypertension Mother   . Hyperlipidemia Father   . Hypertension Father   . Cancer Sister        breast  . Breast cancer Sister 62  . Heart disease Brother        myocardial infarction  . Heart attack Brother   . Hypertension Brother   . Fibromyalgia Sister   . Hypertension Sister   . Heart Problems Sister   . Hypertension Sister   . Prostate cancer Neg Hx   . Kidney cancer Neg Hx   . Bladder Cancer Neg Hx     Social History:  reports that she has never smoked. She has never used smokeless tobacco. She reports that she does not drink alcohol or use drugs.  ROS: UROLOGY Frequent Urination?: No Hard to postpone urination?:  No Burning/pain with urination?: No Get up at night to urinate?: No Leakage of urine?: No Urine stream starts and stops?: No Trouble starting stream?: No Do you have to strain to urinate?: No Blood in urine?: No Urinary tract infection?: No Sexually transmitted disease?: No Injury to kidneys or bladder?: No Painful intercourse?: No Weak stream?: No Currently pregnant?: No Vaginal bleeding?: No Last menstrual period?: n  Gastrointestinal Nausea?: No Vomiting?: No Indigestion/heartburn?: No Diarrhea?: No Constipation?: No  Constitutional Fever: No Night sweats?: No Weight loss?: No Fatigue?: No  Skin Skin rash/lesions?: No Itching?: No  Eyes Blurred vision?: No Double vision?: No  Ears/Nose/Throat Sore throat?: No Sinus problems?: No  Hematologic/Lymphatic Swollen glands?: No Easy bruising?: No  Cardiovascular Leg swelling?: No Chest pain?: No  Respiratory Cough?: No Shortness of breath?: No  Endocrine Excessive thirst?: No  Musculoskeletal Back pain?: No Joint pain?: No  Neurological Headaches?: No Dizziness?: No  Psychologic Depression?: No Anxiety?: No  Physical Exam: BP (!) 142/84 (BP Location: Right Arm, Patient Position: Sitting, Cuff Size: Large)   Pulse 84   Ht 5\' 1"  (1.549 m)   Wt 187 lb 3.2 oz (84.9 kg)   BMI 35.37 kg/m   Constitutional: Well nourished. Alert and oriented, No acute distress. HEENT: [place] AT, moist mucus membranes. Trachea midline, no masses. Cardiovascular: No clubbing, cyanosis, or edema. Respiratory: Normal respiratory effort, no increased work of breathing. Skin: No rashes, bruises or suspicious lesions. Lymph: No cervical or inguinal adenopathy. Neurologic: Grossly intact, no focal deficits, moving all 4 extremities. Psychiatric: Normal mood and affect.  Laboratory Data: Lab Results  Component Value Date   WBC 7.2 01/20/2018   HGB 15.3 01/20/2018   HCT 44.6 01/20/2018   MCV 89.5 01/20/2018   PLT  232 01/20/2018    Lab Results  Component Value Date   CREATININE 0.96 01/20/2018    Lab Results  Component Value Date   HGBA1C 4.9 01/20/2018     Pertinent Imaging: CLINICAL DATA:  Left-sided lithotripsy 1 year ago.  Follow-up exam.  EXAM: ABDOMEN - 1 VIEW  COMPARISON:  01/24/2017.  FINDINGS: Soft tissue structures are unremarkable. Bowel gas pattern is normal. Tiny 2 mm calcific densities in the kidneys most likely tiny renal calyceal stones. Multiple tiny pelvic calcifications are again noted most consistent with phleboliths. These appear to be in similar position. Distal ureteral stone cannot be completely excluded. Degenerative changes and scoliosis lumbar spine.  IMPRESSION: 1. Tiny calcific densities noted over both kidneys most likely tiny approximately 2 mm calyceal stones.  2. Multiple pelvic calcifications, most consistent with phleboliths. Distal ureteral stones cannot be completely excluded.   Electronically Signed   By: Marcello Moores  Register   On: 03/07/2018 15:49   Assessment & Plan:   1. Bilateral nephrolithiasis  - patient does not want to pursue a 24 hour urine study  - KUB yearly  - Advised to contact our office or seek treatment in the ED if becomes febrile or pain/ vomiting are difficult control in order to arrange for emergent/urgent intervention  2. Microscopic hematuria  - UA today is unremarkable.    Return in about 1 year (around 03/08/2019) for KUB, UA and office viist .  [name], [facility] [address] [place], [place] [address] [phone]

## 2018-03-07 ENCOUNTER — Other Ambulatory Visit: Payer: Self-pay | Admitting: Psychiatry

## 2018-03-07 ENCOUNTER — Ambulatory Visit
Admission: RE | Admit: 2018-03-07 | Discharge: 2018-03-07 | Disposition: A | Discharge status: Home / Self Care | Payer: PPO | Source: Ambulatory Visit | Attending: Urology | Admitting: Urology

## 2018-03-07 ENCOUNTER — Ambulatory Visit (INDEPENDENT_AMBULATORY_CARE_PROVIDER_SITE_OTHER): Payer: PPO | Admitting: Urology

## 2018-03-07 ENCOUNTER — Encounter: Payer: Self-pay | Admitting: Urology

## 2018-03-07 VITALS — BP 142/84 | HR 84 | Ht 61.0 in | Wt 187.2 lb

## 2018-03-07 DIAGNOSIS — R93421 Abnormal radiologic findings on diagnostic imaging of right kidney: Secondary | ICD-10-CM | POA: Diagnosis not present

## 2018-03-07 DIAGNOSIS — R109 Unspecified abdominal pain: Secondary | ICD-10-CM | POA: Insufficient documentation

## 2018-03-07 DIAGNOSIS — R93422 Abnormal radiologic findings on diagnostic imaging of left kidney: Secondary | ICD-10-CM | POA: Diagnosis not present

## 2018-03-07 DIAGNOSIS — Z87442 Personal history of urinary calculi: Secondary | ICD-10-CM

## 2018-03-07 DIAGNOSIS — Z87448 Personal history of other diseases of urinary system: Secondary | ICD-10-CM

## 2018-03-07 DIAGNOSIS — N200 Calculus of kidney: Secondary | ICD-10-CM | POA: Diagnosis not present

## 2018-03-07 LAB — URINALYSIS, COMPLETE
Bilirubin, UA: NEGATIVE
Glucose, UA: NEGATIVE
KETONES UA: NEGATIVE
NITRITE UA: NEGATIVE
Protein, UA: NEGATIVE
RBC, UA: NEGATIVE
Specific Gravity, UA: 1.015 (ref 1.005–1.030)
Urobilinogen, Ur: 0.2 mg/dL (ref 0.2–1.0)
pH, UA: 5 (ref 5.0–7.5)

## 2018-03-07 LAB — MICROSCOPIC EXAMINATION
Bacteria, UA: NONE SEEN
EPITHELIAL CELLS (NON RENAL): NONE SEEN /HPF (ref 0–10)
RBC MICROSCOPIC, UA: NONE SEEN /HPF (ref 0–2)

## 2018-03-07 MED ORDER — BUPROPION HCL ER (XL) 150 MG PO TB24: 150.0000 mg | ORAL_TABLET | Freq: Every day | ORAL | 1 refills | Status: DC

## 2018-03-07 NOTE — Telephone Encounter (Signed)
Thanks, will do!

## 2018-03-07 NOTE — Telephone Encounter (Signed)
Okay thank you. I prescribed the Wellbutrin at 150 mg once daily with 2 refills for now. She will be following up with you in June. At that time please reassess her medication regimen.

## 2018-03-09 NOTE — Progress Notes (Signed)
  THERAPIST PROGRESS NOTE   Date of Service:  02/14/2018   Session Time:   1 hour  Patient:   Julia Cooke   DOB:   Oct 25, 1951  MR Number:  [id number]  Location:  [facility] [place] [address] Dept: [id number]            Provider/Observer:  Lubertha South Counselor  Risk of Suicide/Violence: virtually non-existent   Diagnosis:    Bipolar disorder in remission  Type of Therapy: Individual Therapy  Treatment Goals addressed: Coping  Participation Level: Active    Behavioral Observation: [name]  presents as a 67 y.o.-year-old  Interventions: CBT and Motivational Interviewing   Behavioral Response: CasualAlertEuthymic   Summary: Therapist met with Patient in an outpatient setting to assess current mood and assist with making progress towards goals through the use of therapeutic intervention. Therapist did a brief mood check, assessing anger, fear, disgust, excitement, happiness, and sadness.  Patient reports that at times she is happy with her life and other times she feels useless/worthless to her family.  She goes back and forth about obtaining employment to help the family with finances.  She is unable to make a decision even when listing pros and cons.  Discussion of self esteem and what that means to her and gaged her self confidence level.  Explored with Patient enjoyable activities that she enjoys instead of always participating in activities that other people enjoy.    Plan: [name] will engage in enjoyable activities at least twice weekly.   Return again in 2 weeks.

## 2018-03-22 ENCOUNTER — Other Ambulatory Visit: Payer: Self-pay | Admitting: Internal Medicine

## 2018-03-31 ENCOUNTER — Ambulatory Visit (INDEPENDENT_AMBULATORY_CARE_PROVIDER_SITE_OTHER): Payer: PPO | Admitting: Internal Medicine

## 2018-03-31 ENCOUNTER — Encounter: Payer: Self-pay | Admitting: Internal Medicine

## 2018-03-31 ENCOUNTER — Encounter (INDEPENDENT_AMBULATORY_CARE_PROVIDER_SITE_OTHER): Payer: Self-pay

## 2018-03-31 VITALS — BP 148/82 | HR 97 | Temp 98.1°F | Resp 15 | Ht 61.0 in | Wt 187.0 lb

## 2018-03-31 DIAGNOSIS — Z6834 Body mass index (BMI) 34.0-34.9, adult: Secondary | ICD-10-CM

## 2018-03-31 DIAGNOSIS — Z23 Encounter for immunization: Secondary | ICD-10-CM | POA: Diagnosis not present

## 2018-03-31 DIAGNOSIS — Z1231 Encounter for screening mammogram for malignant neoplasm of breast: Secondary | ICD-10-CM

## 2018-03-31 DIAGNOSIS — I10 Essential (primary) hypertension: Secondary | ICD-10-CM

## 2018-03-31 DIAGNOSIS — R7301 Impaired fasting glucose: Secondary | ICD-10-CM

## 2018-03-31 DIAGNOSIS — E6609 Other obesity due to excess calories: Secondary | ICD-10-CM

## 2018-03-31 DIAGNOSIS — Z1239 Encounter for other screening for malignant neoplasm of breast: Secondary | ICD-10-CM

## 2018-03-31 DIAGNOSIS — Z0001 Encounter for general adult medical examination with abnormal findings: Secondary | ICD-10-CM | POA: Diagnosis not present

## 2018-03-31 MED ORDER — LOSARTAN POTASSIUM 50 MG PO TABS: 50.0000 mg | ORAL_TABLET | Freq: Every day | ORAL | 1 refills | Status: DC

## 2018-03-31 MED ORDER — TETANUS-DIPHTH-ACELL PERTUSSIS 5-2.5-18.5 LF-MCG/0.5 IM SUSP: 0.5000 mL | Freq: Once | INTRAMUSCULAR | 0 refills | Status: AC

## 2018-03-31 NOTE — Patient Instructions (Addendum)
You need to drink at least 60 ounces of water daily  Your blood pressure is not at goal yet.  Please  ncrease losartan to 50 mg daily ,  Take in the morning ,  Continue taking  metoprolol at night   I recommend   Doing an aerobics class daily  And following a Mediterranean diet with the intermittent fasting schedule (no food from 7 pm to 11 am )       Why follow it? Research shows. . Those who follow the Mediterranean diet have a reduced risk of heart disease  . The diet is associated with a reduced incidence of Parkinson's and Alzheimer's diseases . People following the diet may have longer life expectancies and lower rates of chronic diseases  . The Dietary Guidelines for Americans recommends the Mediterranean diet as an eating plan to promote health and prevent disease  What Is the Mediterranean Diet?  . Healthy eating plan based on typical foods and recipes of Mediterranean-style cooking . The diet is primarily a plant based diet; these foods should make up a majority of meals   Starches - Plant based foods should make up a majority of meals - They are an important sources of vitamins, minerals, energy, antioxidants, and fiber - Choose whole grains, foods high in fiber and minimally processed items  - Typical grain sources include wheat, oats, barley, corn, brown rice, bulgar, farro, millet, polenta, couscous  - Various types of beans include chickpeas, lentils, fava beans, black beans, white beans   Fruits  Veggies - Large quantities of antioxidant rich fruits & veggies; 6 or more servings  - Vegetables can be eaten raw or lightly drizzled with oil and cooked  - Vegetables common to the traditional Mediterranean Diet include: artichokes, arugula, beets, broccoli, brussel sprouts, cabbage, carrots, celery, collard greens, cucumbers, eggplant, kale, leeks, lemons, lettuce, mushrooms, okra, onions, peas, peppers, potatoes, pumpkin, radishes, rutabaga, shallots, spinach, sweet potatoes,  turnips, zucchini - Fruits common to the Mediterranean Diet include: apples, apricots, avocados, cherries, clementines, dates, figs, grapefruits, grapes, melons, nectarines, oranges, peaches, pears, pomegranates, strawberries, tangerines  Fats - Replace butter and margarine with healthy oils, such as olive oil, canola oil, and tahini  - Limit nuts to no more than a handful a day  - Nuts include walnuts, almonds, pecans, pistachios, pine nuts  - Limit or avoid candied, honey roasted or heavily salted nuts - Olives are central to the [employer] - can be eaten whole or used in a variety of dishes   Meats Protein - Limiting red meat: no more than a few times a month - When eating red meat: choose lean cuts and keep the portion to the size of deck of cards - Eggs: approx. 0 to 4 times a week  - Fish and lean poultry: at least 2 a week  - Healthy protein sources include, chicken, [place], lean beef, lamb - Increase intake of seafood such as tuna, salmon, trout, mackerel, shrimp, scallops - Avoid or limit high fat processed meats such as sausage and bacon  Dairy - Include moderate amounts of low fat dairy products  - Focus on healthy dairy such as fat free yogurt, skim milk, low or reduced fat cheese - Limit dairy products higher in fat such as whole or 2% milk, cheese, ice cream  Alcohol - Moderate amounts of red wine is ok  - No more than 5 oz daily for women (all ages) and men older than age 79  - No  more than 10 oz of wine daily for men younger than 16  Other - Limit sweets and other desserts  - Use herbs and spices instead of salt to flavor foods  - Herbs and spices common to the traditional Mediterranean Diet include: basil, bay leaves, chives, cloves, cumin, fennel, garlic, lavender, marjoram, mint, oregano, parsley, pepper, rosemary, sage, savory, sumac, tarragon, thyme   It's not just a diet, it's a lifestyle:  . The Mediterranean diet includes lifestyle factors typical of those in  the region  . Foods, drinks and meals are best eaten with others and savored . Daily physical activity is important for overall good health . This could be strenuous exercise like running and aerobics . This could also be more leisurely activities such as walking, housework, yard-work, or taking the stairs . Moderation is the key; a balanced and healthy diet accommodates most foods and drinks . Consider portion sizes and frequency of consumption of certain foods   Meal Ideas & Options:  . Breakfast:  o Whole wheat toast or whole wheat English muffins with peanut butter & hard boiled egg o Steel cut oats topped with apples & cinnamon and skim milk  o Fresh fruit: banana, strawberries, melon, berries, peaches  o Smoothies: strawberries, bananas, greek yogurt, peanut butter o Low fat greek yogurt with blueberries and granola  o Egg white omelet with spinach and mushrooms o Breakfast couscous: whole wheat couscous, apricots, skim milk, cranberries  . Sandwiches:  o Hummus and grilled vegetables (peppers, zucchini, squash) on whole wheat bread   o Grilled chicken on whole wheat pita with lettuce, tomatoes, cucumbers or tzatziki  o Tuna salad on whole wheat bread: tuna salad made with greek yogurt, olives, red peppers, capers, green onions o Garlic rosemary lamb pita: lamb sauted with garlic, rosemary, salt & pepper; add lettuce, cucumber, greek yogurt to pita - flavor with lemon juice and black pepper  . Seafood:  o Mediterranean grilled salmon, seasoned with garlic, basil, parsley, lemon juice and black pepper o Shrimp, lemon, and spinach whole-grain pasta salad made with low fat greek yogurt  o Seared scallops with lemon orzo  o Seared tuna steaks seasoned salt, pepper, coriander topped with tomato mixture of olives, tomatoes, olive oil, minced garlic, parsley, green onions and cappers  . Meats:  o Herbed greek chicken salad with kalamata olives, cucumber, feta  o Red bell peppers stuffed  with spinach, bulgur, lean ground beef (or lentils) & topped with feta   o Kebabs: skewers of chicken, tomatoes, onions, zucchini, squash  o [place] burgers: made with red onions, mint, dill, lemon juice, feta cheese topped with roasted red peppers . Vegetarian o Cucumber salad: cucumbers, artichoke hearts, celery, red onion, feta cheese, tossed in olive oil & lemon juice  o Hummus and whole grain pita points with a greek salad (lettuce, tomato, feta, olives, cucumbers, red onion) o Lentil soup with celery, carrots made with vegetable broth, garlic, salt and pepper  o Tabouli salad: parsley, bulgur, mint, scallions, cucumbers, tomato, radishes, lemon juice, olive oil, salt and pepper.

## 2018-03-31 NOTE — Progress Notes (Signed)
Patient ID: Julia Cooke, female    DOB: [birth year]  Age: 67 y.o. MRN: [id number]  The patient is here for annual preventive wellness examination and management of other chronic and acute problems.  Last PAP smear 2015 normal  At age 9 ,  By urology  Patient cannot recall date of last lst TdaP vaccination   cologuard  negative feb 2017   The risk factors are reflected in the social history.  The roster of all physicians providing medical care to patient - is listed in the Snapshot section of the chart.  Activities of daily living:  The patient is 100% independent in all ADLs: dressing, toileting, feeding as well as independent mobility  Home safety : The patient has smoke detectors in the home. They wear seatbelts.  There are no firearms at home. There is no violence in the home.   There is no risks for hepatitis, STDs or HIV. There is no   history of blood transfusion. They have no travel history to infectious disease endemic areas of the world.  The patient has seen their dentist in the last six month. They have seen their eye doctor in the last year. They admit to slight hearing difficulty with regard to whispered voices and some television programs.  They have deferred audiologic testing in the last year.  They do not  have excessive sun exposure. Discussed the need for sun protection: hats, long sleeves and use of sunscreen if there is significant sun exposure.   Diet: the importance of a healthy diet is discussed. They do have a healthy diet.  The benefits of regular aerobic exercise were discussed. She walks 4 times per week ,  30 minutes.   Depression screen: there are no signs or vegative symptoms of depression- irritability, change in appetite, anhedonia, sadness/tearfullness.  Cognitive assessment: the patient manages all their financial and personal affairs and is actively engaged. They could relate day,date,year and events; recalled 2/3 objects at 3 minutes; performed  clock-face test normally.  The following portions of the patient's history were reviewed and updated as appropriate: allergies, current medications, past family history, past medical history,  past surgical history, past social history  and problem list.  Visual acuity was not assessed per patient preference since she has regular follow up with her ophthalmologist. Hearing and body mass index were assessed and reviewed.   During the course of the visit the patient was educated and counseled about appropriate screening and preventive services including : fall prevention , diabetes screening, nutrition counseling, colorectal cancer screening, and recommended immunizations.    CC: The primary encounter diagnosis was Breast cancer screening. Diagnoses of Need for vaccination with 13-polyvalent pneumococcal conjugate vaccine, Impaired fasting glucose, Essential hypertension, and Class 1 obesity due to excess calories without serious comorbidity with body mass index (BMI) of 34.0 to 34.9 in adult were also pertinent to this visit.  Throat dry at night  Sleeps with mouth open, occasionally snores  Frustrated at inability to lose weight.  Follows a low GI diet most days.  Walks for exercise. Diet and exercise reviewed inn detail  Hypertension: patient checks blood pressure  One weekly at home.  Readings have been for the most part > 140/80 at rest . Patient is following a reduced salt diet most days and is taking medications as prescribed  History Julia Cooke has a past medical history of Bipolar disorder ([place]), Depression, Dysrhythmia (10/2016), Heart murmur, Hypertension, Nephrolithiasis (2000), and Psychotic disorder ([place]).   She  has a past surgical history that includes Lithotripsy (2000) and Extracorporeal shock wave lithotripsy (Left, 01/06/2017).   Her family history includes Breast cancer (age of onset: 20) in her sister; Cancer in her sister; Fibromyalgia in her sister; Heart Problems in her  sister; Heart attack in her brother; Heart disease in her brother; Hyperlipidemia in her father and mother; Hypertension in her brother, father, mother, sister, and sister; Stroke in her mother.She reports that she has never smoked. She has never used smokeless tobacco. She reports that she does not drink alcohol or use drugs.  Outpatient Medications Prior to Visit  Medication Sig Dispense Refill  . ARIPiprazole (ABILIFY) 5 MG tablet Take 1 tablet (5 mg total) by mouth daily. 30 tablet 2  . buPROPion (WELLBUTRIN XL) 150 MG 24 hr tablet Take 1 tablet (150 mg total) by mouth daily. 30 tablet 1  . metoprolol succinate (TOPROL-XL) 25 MG 24 hr tablet TAKE ONE TABLET BY MOUTH DAILY 90 tablet 1  . losartan (COZAAR) 25 MG tablet Take 1 tablet (25 mg total) by mouth daily. NEED APPT FOR 90 DAY SUPPLY. OVERDUE SINCE January. 30 tablet 0   No facility-administered medications prior to visit.     Review of Systems   Patient denies headache, fevers, malaise, unintentional weight loss, skin rash, eye pain, sinus congestion and sinus pain, sore throat, dysphagia,  hemoptysis , cough, dyspnea, wheezing, chest pain, palpitations, orthopnea, edema, abdominal pain, nausea, melena, diarrhea, constipation, flank pain, dysuria, hematuria, urinary  Frequency, nocturia, numbness, tingling, seizures,  Focal weakness, Loss of consciousness,  Tremor, insomnia, depression, anxiety, and suicidal ideation.      Objective:  BP (!) 148/82 (BP Location: Left Arm, Patient Position: Sitting, Cuff Size: Large)   Pulse 97   Temp 98.1 F (36.7 C) (Oral)   Resp 15   Ht '5\' 1"'  (1.549 m)   Wt 187 lb (84.8 kg)   SpO2 96%   BMI 35.33 kg/m   Physical Exam   General appearance: alert, cooperative and appears stated age Head: Normocephalic, without obvious abnormality, atraumatic Eyes: conjunctivae/corneas clear. PERRL, EOM's intact. Fundi benign. Ears: normal TM's and external ear canals both ears Nose: Nares normal. Septum  midline. Mucosa normal. No drainage or sinus tenderness. Throat: lips, mucosa, and tongue normal; teeth and gums normal Neck: no adenopathy, no carotid bruit, no JVD, supple, symmetrical, trachea midline and thyroid not enlarged, symmetric, no tenderness/mass/nodules Lungs: clear to auscultation bilaterally Breasts: normal appearance, no masses or tenderness Heart: regular rate and rhythm, S1, S2 normal, no murmur, click, rub or gallop Abdomen: soft, non-tender; bowel sounds normal; no masses,  no organomegaly Extremities: extremities normal, atraumatic, no cyanosis or edema Pulses: 2+ and symmetric Skin: Skin color, texture, turgor normal. No rashes or lesions Neurologic: Alert and oriented X 3, normal strength and tone. Normal symmetric reflexes. Normal coordination and gait.      Assessment & Plan:   Problem List Items Addressed This Visit    Obesity    I spent 15 minutes addressing her inability to lower her BMI by low GI diet alone and recommended intermittent fasting and more strenuous  exercise a minimum of 5 days per week.       Impaired fasting glucose    She has no signs of prediabetes by A1c. She is following a low glycemic index diet   Lab Results  Component Value Date   HGBA1C 4.9 01/20/2018            Essential hypertension  Not at goal on losartan 25 mg,  Increase to 50 mg daily.  Lab Results  Component Value Date   CREATININE 0.96 01/20/2018   Lab Results  Component Value Date   NA 138 01/20/2018   K 4.0 01/20/2018   CL 106 01/20/2018   CO2 23 01/20/2018         Relevant Medications   losartan (COZAAR) 50 MG tablet   Encounter for preventive health examination - Primary    Annual comprehensive preventive exam was done as well as an evaluation and management of chronic conditions .  During the course of the visit the patient was educated and counseled about appropriate screening and preventive services including :  diabetes screening, lipid  analysis with projected  10 year  risk for CAD , nutrition counseling, breast, cervical and colorectal cancer screening, and recommended immunizations.  Printed recommendations for health maintenance screenings was given       Other Visit Diagnoses    Need for vaccination with 13-polyvalent pneumococcal conjugate vaccine       Relevant Orders   Pneumococcal conjugate vaccine 13-valent IM (Completed)      I have changed Julia Cooke's losartan. I am also having her start on Tdap. Additionally, I am having her maintain her metoprolol succinate, ARIPiprazole, and buPROPion.  Meds ordered this encounter  Medications  . Tdap (BOOSTRIX) 5-2.5-18.5 LF-MCG/0.5 injection    Sig: Inject 0.5 mLs into the muscle once for 1 dose.    Dispense:  0.5 mL    Refill:  0  . losartan (COZAAR) 50 MG tablet    Sig: Take 1 tablet (50 mg total) by mouth daily. NEED APPT FOR 90 DAY SUPPLY. OVERDUE SINCE January.    Dispense:  90 tablet    Refill:  1    Medications Discontinued During This Encounter  Medication Reason  . losartan (COZAAR) 25 MG tablet     Follow-up: Return in about 6 months (around 10/01/2018).   Crecencio Mc, MD

## 2018-04-02 NOTE — Assessment & Plan Note (Signed)
She has no signs of prediabetes by A1c. She is following a low glycemic index diet   Lab Results  Component Value Date   HGBA1C 4.9 01/20/2018

## 2018-04-02 NOTE — Assessment & Plan Note (Signed)
Not at goal on losartan 25 mg,  Increase to 50 mg daily.  Lab Results  Component Value Date   CREATININE 0.96 01/20/2018   Lab Results  Component Value Date   NA 138 01/20/2018   K 4.0 01/20/2018   CL 106 01/20/2018   CO2 23 01/20/2018

## 2018-04-02 NOTE — Assessment & Plan Note (Signed)
Annual comprehensive preventive exam was done as well as an evaluation and management of chronic conditions .  During the course of the visit the patient was educated and counseled about appropriate screening and preventive services including :  diabetes screening, lipid analysis with projected  10 year  risk for CAD , nutrition counseling, breast, cervical and colorectal cancer screening, and recommended immunizations.  Printed recommendations for health maintenance screenings was given 

## 2018-04-02 NOTE — Assessment & Plan Note (Signed)
I spent 15 minutes addressing her inability to lower her BMI by low GI diet alone and recommended intermittent fasting and more strenuous  exercise a minimum of 5 days per week.  

## 2018-04-08 ENCOUNTER — Other Ambulatory Visit: Payer: Self-pay | Admitting: Internal Medicine

## 2018-05-02 ENCOUNTER — Encounter: Payer: Self-pay | Admitting: Psychiatry

## 2018-05-02 ENCOUNTER — Ambulatory Visit: Payer: PPO | Admitting: Psychiatry

## 2018-05-02 ENCOUNTER — Ambulatory Visit (INDEPENDENT_AMBULATORY_CARE_PROVIDER_SITE_OTHER): Payer: PPO | Admitting: Licensed Clinical Social Worker

## 2018-05-02 ENCOUNTER — Other Ambulatory Visit: Payer: Self-pay

## 2018-05-02 VITALS — BP 134/82 | HR 83 | Temp 97.9°F | Wt 190.4 lb

## 2018-05-02 DIAGNOSIS — F3110 Bipolar disorder, current episode manic without psychotic features, unspecified: Secondary | ICD-10-CM

## 2018-05-02 MED ORDER — ARIPIPRAZOLE 10 MG PO TABS
5.0000 mg | ORAL_TABLET | Freq: Every day | ORAL | 2 refills | Status: DC
Start: 2018-05-02 — End: 2018-07-27

## 2018-05-02 MED ORDER — ARIPIPRAZOLE 2 MG PO TABS: 2.0000 mg | ORAL_TABLET | Freq: Every day | ORAL | 2 refills | Status: DC

## 2018-05-02 MED ORDER — HYDROXYZINE HCL 10 MG PO TABS: 10.0000 mg | ORAL_TABLET | Freq: Every day | ORAL | 2 refills | Status: DC | PRN

## 2018-05-02 MED ORDER — BUPROPION HCL ER (XL) 150 MG PO TB24: 150.0000 mg | ORAL_TABLET | Freq: Every day | ORAL | 2 refills | Status: DC

## 2018-05-02 NOTE — Patient Instructions (Signed)
Hydroxyzine capsules or tablets What is this medicine? HYDROXYZINE (hye [place] i zeen) is an antihistamine. This medicine is used to treat allergy symptoms. It is also used to treat anxiety and tension. This medicine can be used with other medicines to induce sleep before surgery. This medicine may be used for other purposes; ask your health care provider or pharmacist if you have questions. COMMON BRAND NAME(S): ANX, Atarax, Rezine, Vistaril What should I tell my health care provider before I take this medicine? They need to know if you have any of these conditions: -any chronic illness -difficulty passing urine -glaucoma -heart disease -kidney disease -liver disease -lung disease -an unusual or allergic reaction to hydroxyzine, cetirizine, other medicines, foods, dyes, or preservatives -pregnant or trying to get pregnant -breast-feeding How should I use this medicine? Take this medicine by mouth with a full glass of water. Follow the directions on the prescription label. You may take this medicine with food or on an empty stomach. Take your medicine at regular intervals. Do not take your medicine more often than directed. Talk to your pediatrician regarding the use of this medicine in children. Special care may be needed. While this drug may be prescribed for children as young as 75 years of age for selected conditions, precautions do apply. Patients over 62 years old may have a stronger reaction and need a smaller dose. Overdosage: If you think you have taken too much of this medicine contact a poison control center or emergency room at once. NOTE: This medicine is only for you. Do not share this medicine with others. What if I miss a dose? If you miss a dose, take it as soon as you can. If it is almost time for your next dose, take only that dose. Do not take double or extra doses. What may interact with this medicine? -alcohol -barbiturate medicines for sleep or seizures -medicines for  colds, allergies -medicines for depression, anxiety, or emotional disturbances -medicines for pain -medicines for sleep -muscle relaxants This list may not describe all possible interactions. Give your health care provider a list of all the medicines, herbs, non-prescription drugs, or dietary supplements you use. Also tell them if you smoke, drink alcohol, or use illegal drugs. Some items may interact with your medicine. What should I watch for while using this medicine? Tell your doctor or health care professional if your symptoms do not improve. You may get drowsy or dizzy. Do not drive, use machinery, or do anything that needs mental alertness until you know how this medicine affects you. Do not stand or sit up quickly, especially if you are an older patient. This reduces the risk of dizzy or fainting spells. Alcohol may interfere with the effect of this medicine. Avoid alcoholic drinks. Your mouth may get dry. Chewing sugarless gum or sucking hard candy, and drinking plenty of water may help. Contact your doctor if the problem does not go away or is severe. This medicine may cause dry eyes and blurred vision. If you wear contact lenses you may feel some discomfort. Lubricating drops may help. See your eye doctor if the problem does not go away or is severe. If you are receiving skin tests for allergies, tell your doctor you are using this medicine. What side effects may I notice from receiving this medicine? Side effects that you should report to your doctor or health care professional as soon as possible: -fast or irregular heartbeat -difficulty passing urine -seizures -slurred speech or confusion -tremor Side effects that  usually do not require medical attention (report to your doctor or health care professional if they continue or are bothersome): -constipation -drowsiness -fatigue -headache -stomach upset This list may not describe all possible side effects. Call your doctor for  medical advice about side effects. You may report side effects to FDA at 1-800-FDA-1088. Where should I keep my medicine? Keep out of the reach of children. Store at room temperature between 15 and 30 degrees C (59 and 86 degrees F). Keep container tightly closed. Throw away any unused medicine after the expiration date. NOTE: This sheet is a summary. It may not cover all possible information. If you have questions about this medicine, talk to your doctor, pharmacist, or health care provider.  2018 Elsevier/Gold Standard (2008-03-29 14:50:59)  

## 2018-05-02 NOTE — Progress Notes (Signed)
[facility] MD OP Progress Note  05/02/2018 2:20 PM Julia Cooke  MRN:  [id number]  Chief Complaint: ' I am here for follow up.' Chief Complaint    Follow-up; Medication Refill     HPI: Julia Cooke is a 67 year old Caucasian female, married, on SSI, lives in [place], has a history of bipolar disorder, currently in partial remission, presented to the clinic today for a follow-up visit.  Patient used to follow up with Dr. Einar Grad in the past.  This is her first visit with Probation officer.  Patient today reports she is currently doing well.  She continues to have some psychosocial stressors, financial issues however reports she is coping better.  Her Abilify was increased previously by Dr. Einar Grad from 2 mg to 5 mg.  Patient reports she does not think she can stay on the 5 mg since its more expensive for her to pay the co-pay every month for the same.  She reports she likes the effect of the 5 mg but would like to go back to 2 mg since it is not affordable for her.    She reports sleep is good.  She reports her appetite is fair.  Patient denies any suicidality.  Patient denies any perceptual disturbances.  Patient reports she continues to have good social support from her family.  She denies any other concerns today. Visit Diagnosis:    ICD-10-CM   1. Bipolar I disorder, most recent episode (or current) manic with anxious distress ([place]) F31.10    in partial remission    Past Psychiatric History: Recent used to be under the care of Dr. Einar Grad here in clinic.  Patient reports a history of being admitted on the inpatient unit for a day to be observed 3 years ago.  Patient reports that at that time she was going through a manic phase.  Patient reports a history of several suicide attempts in the past.    Past Medical History:  Past Medical History:  Diagnosis Date  . Bipolar disorder ([place])    takes Abilify  . Depression   . Dysrhythmia 10/2016   LBBB on ekg...nothing to compare it to.  [facility] Heart murmur   .  Hypertension   . Nephrolithiasis 2000   s/p lithotripsy  . Psychotic disorder ([place])    mania    Past Surgical History:  Procedure Laterality Date  . EXTRACORPOREAL SHOCK WAVE LITHOTRIPSY Left 01/06/2017   Procedure: EXTRACORPOREAL SHOCK WAVE LITHOTRIPSY (ESWL);  Surgeon: Hollice Espy, MD;  Location: [facility] ORS;  Service: Urology;  Laterality: Left;  . LITHOTRIPSY  2000    Family Psychiatric History: She denies any family history of mental health problems  Family History:  Family History  Problem Relation Age of Onset  . Hyperlipidemia Mother   . Stroke Mother   . Hypertension Mother   . Hyperlipidemia Father   . Hypertension Father   . Cancer Sister        breast  . Breast cancer Sister 71  . Heart disease Brother        myocardial infarction  . Heart attack Brother   . Hypertension Brother   . Fibromyalgia Sister   . Hypertension Sister   . Heart Problems Sister   . Hypertension Sister   . Prostate cancer Neg Hx   . Kidney cancer Neg Hx   . Bladder Cancer Neg Hx     Social History: She is married.  She is currently retired.  She is on SSI.  She has 3 children-all  adults and one grandson.  She reports she has a good relationship with her family.  She lives in [place] with her husband, her son and her grandson. Social History   Socioeconomic History  . Marital status: Married    Spouse name: richard  . Number of children: 3  . Years of education: Not on file  . Highest education level: Some college, no degree  Occupational History    Comment: retired  Scientific laboratory technician  . Financial resource strain: Not hard at all  . Food insecurity:    Worry: Never true    Inability: Never true  . Transportation needs:    Medical: No    Non-medical: No  Tobacco Use  . Smoking status: Never Smoker  . Smokeless tobacco: Never Used  Substance and Sexual Activity  . Alcohol use: No    Alcohol/week: 0.0 - 1.2 oz    Comment: 2-3 times weekly  . Drug use: No  . Sexual activity:  Yes    Birth control/protection: Post-menopausal, None  Lifestyle  . Physical activity:    Days per week: 4 days    Minutes per session: 120 min  . Stress: To some extent  Relationships  . Social connections:    Talks on phone: More than three times a week    Gets together: More than three times a week    Attends religious service: More than 4 times per year    Active member of club or organization: Yes    Attends meetings of clubs or organizations: More than 4 times per year    Relationship status: Married  Other Topics Concern  . Not on file  Social History Narrative   Patient lives at home with her husband of 73 years. They have 3 adult children (2 boys, 1 girl). They recently moved back to [place] in January. 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    Allergies:  Allergies  Allergen Reactions  . Penicillins Swelling    As a child    Metabolic Disorder Labs: Lab Results  Component Value Date   HGBA1C 4.9 01/20/2018   MPG 94 01/20/2018   Lab Results  Component Value Date   PROLACTIN 3.2 (L) 01/20/2018   Lab Results  Component Value Date   CHOL 221 (H) 01/20/2018   TRIG 131 01/20/2018   HDL 53 01/20/2018   CHOLHDL 4.2 01/20/2018   VLDL 26 01/20/2018   LDLCALC 142 (H) 01/20/2018   LDLCALC 157 (H) 12/23/2016   Lab Results  Component Value Date   TSH 0.68 12/23/2016   TSH 1.20 12/25/2015    Therapeutic Level Labs: No results found for: LITHIUM No results found for: VALPROATE No components found for:  CBMZ  Current Medications: Current Outpatient Medications  Medication Sig Dispense Refill  . buPROPion (WELLBUTRIN XL) 150 MG 24 hr tablet Take 1 tablet (150 mg total) by mouth daily. 30 tablet 2  . losartan (COZAAR) 25 MG tablet TAKE ONE TABLET BY MOUTH DAILY 90 tablet 1  . losartan (COZAAR) 50 MG tablet Take 1 tablet (50 mg total) by mouth daily. NEED APPT FOR 90 DAY SUPPLY. OVERDUE  SINCE January. 90 tablet 1  . metoprolol succinate (TOPROL-XL) 25 MG 24 hr tablet TAKE ONE TABLET BY MOUTH DAILY 90 tablet 1  . ARIPiprazole (ABILIFY) 10 MG tablet Take 0.5 tablets (5 mg total) by mouth daily. 15 tablet 2  . ARIPiprazole (  ABILIFY) 2 MG tablet Take 1 tablet (2 mg total) by mouth daily. 30 tablet 2  . hydrOXYzine (ATARAX/VISTARIL) 10 MG tablet Take 1-2 tablets (10-20 mg total) by mouth daily as needed for anxiety. Only for severe anxiety sx. 45 tablet 2   No current facility-administered medications for this visit.      Musculoskeletal: Strength & Muscle Tone: within normal limits Gait & Station: normal Patient leans: N/A  Psychiatric Specialty Exam: Review of Systems  Psychiatric/Behavioral: The patient is nervous/anxious.   All other systems reviewed and are negative.   Blood pressure 134/82, pulse 83, temperature 97.9 F (36.6 C), temperature source Oral, weight 190 lb 6.4 oz (86.4 kg).Body mass index is 35.98 kg/m.  General Appearance: Casual  Eye Contact:  Fair  Speech:  Clear and Coherent  Volume:  Normal  Mood:  Anxious  Affect:  Congruent  Thought Process:  Goal Directed and Descriptions of Associations: Intact  Orientation:  Full (Time, Place, and Person)  Thought Content: Logical   Suicidal Thoughts:  No  Homicidal Thoughts:  No  Memory:  Immediate;   Fair Recent;   Fair Remote;   Fair  Judgement:  Fair  Insight:  Fair  Psychomotor Activity:  Normal  Concentration:  Concentration: Fair and Attention Span: Fair  Recall:  [employer] of Knowledge: Fair  Language: Fair  Akathisia:  No  Handed:  Right  AIMS (if indicated): 0  Assets:  Communication Skills Desire for Improvement Social Support  ADL's:  Intact  Cognition: WNL  Sleep:  Fair   Screenings: PHQ2-9     Office Visit from 12/23/2016 in [facility]  PHQ-2 Total Score  1       Assessment and Plan: Julia Cooke is a 67 year old Caucasian female who has a history of  bipolar disorder, currently in remission, presented to the clinic today for a follow-up visit.  Patient used to be under the care of Dr. Einar Grad here in clinic.  She is currently compliant with her medications.  Continue plan as noted below.  Plan Bipolar disorder type I Continue Abilify 5 mg p.o. daily.  Will give her prescription for Abilify 10 mg #15 pills.  Patient will verify with pharmacy if this is more affordable for her.  If not we will change her back to Abilify 2 mg based on patient preference. Will continue Wellbutrin XL 150 mg p.o. daily. Will add hydroxyzine 10-20 mg p.o. daily as needed for severe anxiety symptoms  I reviewed previous notes per Dr. Einar Grad.  Follow-up in clinic in 2-3 months or sooner if needed.  More than 50 % of the time was spent for psychoeducation and supportive psychotherapy and care coordination.  This note was generated in part or whole with voice recognition software. Voice recognition is usually quite accurate but there are transcription errors that can and very often do occur. I apologize for any typographical errors that were not detected and corrected.       Ursula Alert, MD 05/02/2018, 2:20 PM

## 2018-05-17 ENCOUNTER — Ambulatory Visit: Payer: PPO

## 2018-06-02 NOTE — Progress Notes (Signed)
   THERAPIST PROGRESS NOTE  Session Time: 60 min  Participation Level: Active  Behavioral Response: CasualAlertEuthymic  Type of Therapy: Individual Therapy  Treatment Goals addressed: Coping  Interventions: CBT and Motivational Interviewing  Summary: Julia Cooke is a 67 y.o. female who presents with continued symptoms of her diagnosis.  Therapist met with Patient in an outpatient setting to assess current mood and assist with making progress towards goals through the use of therapeutic intervention. Therapist did a brief mood check, assessing anger, fear, disgust, excitement, happiness, and sadness.  Prompted Patient for her thought's regarding her efforts to stabilize her mental health symptoms over the past week.  Encouraged Patient to build a larger support network. Engaged Patient in the development of effective relaxation techniques to better manage overwhelming feelings and decrease adverse reactive behaviors.  Provided psychoeducation on focused breathing and progressive muscle relaxation via "Topic 8a: Relaxation Techniques" (IMR: Practitioner Guides and Handouts, p.285). Modeled the aforementioned relaxation skills for Patient before engaging her in a cognitive rehearsal of the activities to ensure independent ease of use, comfort, and effectiveness.    Suicidal/Homicidal: No  Plan: Return again in 2 weeks.  Diagnosis: Axis I: Bipolar, Depressed    Axis II: No diagnosis    Lubertha South, LCSW 05/03/2018

## 2018-06-13 ENCOUNTER — Ambulatory Visit: Payer: PPO | Admitting: Licensed Clinical Social Worker

## 2018-07-17 DIAGNOSIS — D2262 Melanocytic nevi of left upper limb, including shoulder: Secondary | ICD-10-CM | POA: Diagnosis not present

## 2018-07-17 DIAGNOSIS — D2271 Melanocytic nevi of right lower limb, including hip: Secondary | ICD-10-CM | POA: Diagnosis not present

## 2018-07-17 DIAGNOSIS — D2272 Melanocytic nevi of left lower limb, including hip: Secondary | ICD-10-CM | POA: Diagnosis not present

## 2018-07-17 DIAGNOSIS — Z08 Encounter for follow-up examination after completed treatment for malignant neoplasm: Secondary | ICD-10-CM | POA: Diagnosis not present

## 2018-07-17 DIAGNOSIS — L821 Other seborrheic keratosis: Secondary | ICD-10-CM | POA: Diagnosis not present

## 2018-07-17 DIAGNOSIS — Z85828 Personal history of other malignant neoplasm of skin: Secondary | ICD-10-CM | POA: Diagnosis not present

## 2018-07-17 DIAGNOSIS — D2261 Melanocytic nevi of right upper limb, including shoulder: Secondary | ICD-10-CM | POA: Diagnosis not present

## 2018-07-27 ENCOUNTER — Other Ambulatory Visit: Payer: Self-pay | Admitting: Psychiatry

## 2018-08-02 ENCOUNTER — Ambulatory Visit: Payer: PPO | Admitting: Psychiatry

## 2018-08-28 ENCOUNTER — Telehealth: Payer: Self-pay

## 2018-08-28 NOTE — Telephone Encounter (Signed)
Received a fax requesting a refill on wellbutrin xl  Pt was last seen on 05-02-18 next appt  08-31-18    buPROPion (WELLBUTRIN XL) 150 MG 24 hr tablet  Medication  Date: 05/02/2018 Department: [facility] Ordering/Authorizing: Ursula Alert, MD  Order Providers   Prescribing Provider Encounter Provider  Ursula Alert, MD Ursula Alert, MD  Outpatient Medication Detail    Disp Refills Start End   buPROPion (WELLBUTRIN XL) 150 MG 24 hr tablet 30 tablet 2 05/02/2018    Sig - Route: Take 1 tablet (150 mg total) by mouth daily. - Oral   Sent to pharmacy as: buPROPion (WELLBUTRIN XL) 150 MG 24 hr tablet   E-Prescribing Status: Receipt confirmed by pharmacy (05/02/2018 1:53 PM EDT)

## 2018-08-28 NOTE — Telephone Encounter (Signed)
pt needs enough medication to do until she comes in on 08-31-18 pt last seen on  05-02-18    buPROPion (WELLBUTRIN XL) 150 MG 24 hr tablet  Medication  Da 05/02/2018 Department: [facility] Ordering/Authorizing: Ursula Alert, MD  Order Providers   Prescribing Provider Encounter Provider  Ursula Alert, MD Ursula Alert, MD  Outpatient Medication Detail    Disp Refills Start End   buPROPion (WELLBUTRIN XL) 150 MG 24 hr tablet 30 tablet 2 05/02/2018    Sig - Route: Take 1 tablet (150 mg total) by mouth daily. - Oral   Sent to pharmacy as: buPROPion (WELLBUTRIN XL) 150 MG 24 hr tablet   E-Prescribing Status: Receipt confirmed by pharmacy (05/02/2018 1:53 PM EDT)

## 2018-08-31 ENCOUNTER — Encounter: Payer: Self-pay | Admitting: Psychiatry

## 2018-08-31 ENCOUNTER — Ambulatory Visit: Payer: PPO | Admitting: Psychiatry

## 2018-08-31 ENCOUNTER — Other Ambulatory Visit: Payer: Self-pay

## 2018-08-31 VITALS — BP 145/101 | HR 77 | Temp 98.3°F | Wt 190.6 lb

## 2018-08-31 DIAGNOSIS — F3131 Bipolar disorder, current episode depressed, mild: Secondary | ICD-10-CM

## 2018-08-31 DIAGNOSIS — R030 Elevated blood-pressure reading, without diagnosis of hypertension: Secondary | ICD-10-CM | POA: Diagnosis not present

## 2018-08-31 MED ORDER — BUPROPION HCL ER (XL) 300 MG PO TB24: 300.0000 mg | ORAL_TABLET | Freq: Every day | ORAL | 1 refills | Status: DC

## 2018-08-31 MED ORDER — ARIPIPRAZOLE 10 MG PO TABS: ORAL_TABLET | ORAL | 2 refills | Status: DC

## 2018-08-31 MED ORDER — HYDROXYZINE HCL 10 MG PO TABS: 10.0000 mg | ORAL_TABLET | Freq: Every day | ORAL | 2 refills | Status: DC | PRN

## 2018-08-31 NOTE — Progress Notes (Signed)
[facility] MD OP Progress Note  08/31/2018 11:52 AM Julia Cooke  MRN:  [id number]  Chief Complaint: ' I am here for follow up.' Chief Complaint    Follow-up; Medication Refill     HPI: Julia Cooke is a 67 year old Caucasian female, married, on SSI, lives in [place], has a history of bipolar disorder, presented to the clinic today for a follow-up visit.  Patient today reports she has been experiencing some depressive symptoms since the past few weeks.  She reports she feels tired, low energy, fatigue, lack of motivation and so on.  She reports her sleep is fair.  She reports her appetite is fair.  She denies any perceptual disturbances.  She denies any euphoria or mood lability.  She denies any anxiety symptoms.  She denies any suicidality or homicidality.  She reports she has been taking her medications as prescribed.  She wonders whether her Wellbutrin will help her feel better by increasing the dosage.  She is tolerating the Wellbutrin well.  Discussed with patient that the dosage of Wellbutrin can be increased however she has to monitor herself for hypomanic or manic symptoms.  Discussed with her to monitor herself for sleep problems , restlessness,  irritability and so on.  She agrees with plan.  Patient continues to have good social support system.  Pt also struggles with toothache today.  She reports she has an upcoming appointment with her dental provider.  Patient denies any other concerns today. Visit Diagnosis:    ICD-10-CM   1. Bipolar disorder, current episode depressed, mild (HCC) F31.31 ARIPiprazole (ABILIFY) 10 MG tablet    buPROPion (WELLBUTRIN XL) 300 MG 24 hr tablet    hydrOXYzine (ATARAX/VISTARIL) 10 MG tablet  2. Elevated blood pressure reading R03.0     Past Psychiatric History: Have reviewed past psychiatric history from my progress note on 05/02/2018  Past Medical History:  Past Medical History:  Diagnosis Date  . Bipolar disorder ([place])    takes Abilify  . Depression    . Dysrhythmia 10/2016   LBBB on ekg...nothing to compare it to.  Marland Kitchen Heart murmur   . Hypertension   . Nephrolithiasis 2000   s/p lithotripsy  . Psychotic disorder ([place])    mania    Past Surgical History:  Procedure Laterality Date  . EXTRACORPOREAL SHOCK WAVE LITHOTRIPSY Left 01/06/2017   Procedure: EXTRACORPOREAL SHOCK WAVE LITHOTRIPSY (ESWL);  Surgeon: Hollice Espy, MD;  Location: [facility] ORS;  Service: Urology;  Laterality: Left;  . LITHOTRIPSY  2000    Family Psychiatric History: Have reviewed family-psychiatric history from my progress note on 05/02/2018.  Family History:  Family History  Problem Relation Age of Onset  . Hyperlipidemia Mother   . Stroke Mother   . Hypertension Mother   . Hyperlipidemia Father   . Hypertension Father   . Cancer Sister        breast  . Breast cancer Sister 25  . Heart disease Brother        myocardial infarction  . Heart attack Brother   . Hypertension Brother   . Fibromyalgia Sister   . Hypertension Sister   . Heart Problems Sister   . Hypertension Sister   . Prostate cancer Neg Hx   . Kidney cancer Neg Hx   . Bladder Cancer Neg Hx     Social History: Reviewed social history from my progress note on 05/02/2018 Social History   Socioeconomic History  . Marital status: Married    Spouse name: richard  . Number  of children: 3  . Years of education: Not on file  . Highest education level: Some college, no degree  Occupational History    Comment: retired  Scientific laboratory technician  . Financial resource strain: Not hard at all  . Food insecurity:    Worry: Never true    Inability: Never true  . Transportation needs:    Medical: No    Non-medical: No  Tobacco Use  . Smoking status: Never Smoker  . Smokeless tobacco: Never Used  Substance and Sexual Activity  . Alcohol use: No    Alcohol/week: 0.0 - 2.0 standard drinks    Comment: 2-3 times weekly  . Drug use: No  . Sexual activity: Yes    Birth control/protection: Post-menopausal, None   Lifestyle  . Physical activity:    Days per week: 4 days    Minutes per session: 120 min  . Stress: To some extent  Relationships  . Social connections:    Talks on phone: More than three times a week    Gets together: More than three times a week    Attends religious service: More than 4 times per year    Active member of club or organization: Yes    Attends meetings of clubs or organizations: More than 4 times per year    Relationship status: Married  Other Topics Concern  . Not on file  Social History Narrative   Patient lives at home with her husband of 64 years. They have 3 adult children (2 boys, 1 girl). They recently moved back to [place] in January. 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    Allergies:  Allergies  Allergen Reactions  . Penicillins Swelling    As a child    Metabolic Disorder Labs: Lab Results  Component Value Date   HGBA1C 4.9 01/20/2018   MPG 94 01/20/2018   Lab Results  Component Value Date   PROLACTIN 3.2 (L) 01/20/2018   Lab Results  Component Value Date   CHOL 221 (H) 01/20/2018   TRIG 131 01/20/2018   HDL 53 01/20/2018   CHOLHDL 4.2 01/20/2018   VLDL 26 01/20/2018   LDLCALC 142 (H) 01/20/2018   LDLCALC 157 (H) 12/23/2016   Lab Results  Component Value Date   TSH 0.68 12/23/2016   TSH 1.20 12/25/2015    Therapeutic Level Labs: No results found for: LITHIUM No results found for: VALPROATE No components found for:  CBMZ  Current Medications: Current Outpatient Medications  Medication Sig Dispense Refill  . ARIPiprazole (ABILIFY) 10 MG tablet TAKE 0.5 TABLETS BY MOUTH DAILY 15 tablet 2  . hydrOXYzine (ATARAX/VISTARIL) 10 MG tablet Take 1-2 tablets (10-20 mg total) by mouth daily as needed for anxiety. Only for severe anxiety sx. 45 tablet 2  . losartan (COZAAR) 25 MG tablet TAKE ONE TABLET BY MOUTH DAILY 90 tablet 1  . losartan (COZAAR) 50 MG tablet  Take 1 tablet (50 mg total) by mouth daily. NEED APPT FOR 90 DAY SUPPLY. OVERDUE SINCE January. 90 tablet 1  . metoprolol succinate (TOPROL-XL) 25 MG 24 hr tablet TAKE ONE TABLET BY MOUTH DAILY 90 tablet 1  . buPROPion (WELLBUTRIN XL) 300 MG 24 hr tablet Take 1 tablet (300 mg total) by mouth daily. 30 tablet 1   No current facility-administered medications for this visit.      Musculoskeletal: Strength & Muscle Tone: within normal limits Gait & Station:  normal Patient leans: N/A  Psychiatric Specialty Exam: Review of Systems  Constitutional: Positive for malaise/fatigue.  HENT:       Tooth ache   Psychiatric/Behavioral: Positive for depression.  All other systems reviewed and are negative.   Blood pressure (!) 145/101, pulse 77, temperature 98.3 F (36.8 C), temperature source Oral, weight 190 lb 9.6 oz (86.5 kg).Body mass index is 36.01 kg/m.  General Appearance: Casual  Eye Contact:  Fair  Speech:  Clear and Coherent  Volume:  Normal  Mood:  Dysphoric  Affect:  Congruent  Thought Process:  Goal Directed and Descriptions of Associations: Intact  Orientation:  Full (Time, Place, and Person)  Thought Content: Logical   Suicidal Thoughts:  No  Homicidal Thoughts:  No  Memory:  Immediate;   Fair Recent;   Fair Remote;   Fair  Judgement:  Fair  Insight:  Fair  Psychomotor Activity:  Normal  Concentration:  Concentration: Fair and Attention Span: Fair  Recall:  [employer] of Knowledge: Fair  Language: Fair  Akathisia:  No  Handed:  Right  AIMS (if indicated): 0  Assets:  Communication Skills Desire for Improvement Housing Social Support  ADL's:  Intact  Cognition: WNL  Sleep:  Fair   Screenings: PHQ2-9     Office Visit from 12/23/2016 in [place]  PHQ-2 Total Score  1       Assessment and Plan: Julia Cooke is a 67 year old Caucasian female who has a history of bipolar disorder, currently having some depressive symptoms, presented to the clinic  today for a follow-up visit.  Patient reports some recent low mood, lack of motivation and fatigue.  We will continue to make medication changes as noted below.  Plan Bipolar disorder type I Continue Abilify 5 mg p.o. daily Increase Wellbutrin XL to 300 mg p.o. daily in the a.m. AIMS - 0 Continue hydroxyzine 10-20 mg p.o. daily as needed for severe anxiety symptoms.  Elevated blood pressure Patient reports her elevated blood pressure is likely due to her tooth ache.  She reports she will reach out to her primary medical doctor and her dentist.  Follow-up in clinic in 3-4 weeks or sooner if needed.  More than 50 % of the time was spent for psychoeducation and supportive psychotherapy and care coordination.  This note was generated in part or whole with voice recognition software. Voice recognition is usually quite accurate but there are transcription errors that can and very often do occur. I apologize for any typographical errors that were not detected and corrected.       Ursula Alert, MD 08/31/2018, 11:52 AM

## 2018-09-12 ENCOUNTER — Ambulatory Visit
Admission: RE | Admit: 2018-09-12 | Discharge: 2018-09-12 | Disposition: A | Discharge status: Home / Self Care | Payer: PPO | Source: Ambulatory Visit | Attending: Internal Medicine | Admitting: Internal Medicine

## 2018-09-12 DIAGNOSIS — Z1231 Encounter for screening mammogram for malignant neoplasm of breast: Secondary | ICD-10-CM | POA: Diagnosis not present

## 2018-09-12 DIAGNOSIS — Z1239 Encounter for other screening for malignant neoplasm of breast: Secondary | ICD-10-CM

## 2018-09-27 ENCOUNTER — Other Ambulatory Visit: Payer: Self-pay

## 2018-09-27 MED ORDER — METOPROLOL SUCCINATE ER 25 MG PO TB24: 25.0000 mg | ORAL_TABLET | Freq: Every day | ORAL | 1 refills | Status: DC

## 2018-09-27 MED ORDER — LOSARTAN POTASSIUM 50 MG PO TABS: 50.0000 mg | ORAL_TABLET | Freq: Every day | ORAL | 1 refills | Status: DC

## 2018-09-28 ENCOUNTER — Ambulatory Visit: Payer: PPO | Admitting: Psychiatry

## 2018-10-02 ENCOUNTER — Encounter: Payer: Self-pay | Admitting: Internal Medicine

## 2018-10-02 ENCOUNTER — Ambulatory Visit (INDEPENDENT_AMBULATORY_CARE_PROVIDER_SITE_OTHER): Payer: PPO | Admitting: Internal Medicine

## 2018-10-02 VITALS — BP 130/86 | HR 75 | Temp 98.1°F | Resp 14 | Ht 61.0 in | Wt 187.0 lb

## 2018-10-02 DIAGNOSIS — K591 Functional diarrhea: Secondary | ICD-10-CM

## 2018-10-02 DIAGNOSIS — R7301 Impaired fasting glucose: Secondary | ICD-10-CM

## 2018-10-02 DIAGNOSIS — R0683 Snoring: Secondary | ICD-10-CM

## 2018-10-02 DIAGNOSIS — Z6834 Body mass index (BMI) 34.0-34.9, adult: Secondary | ICD-10-CM | POA: Diagnosis not present

## 2018-10-02 DIAGNOSIS — Z23 Encounter for immunization: Secondary | ICD-10-CM

## 2018-10-02 DIAGNOSIS — E66811 Obesity, class 1: Secondary | ICD-10-CM

## 2018-10-02 DIAGNOSIS — E6609 Other obesity due to excess calories: Secondary | ICD-10-CM

## 2018-10-02 DIAGNOSIS — I10 Essential (primary) hypertension: Secondary | ICD-10-CM | POA: Diagnosis not present

## 2018-10-02 DIAGNOSIS — R5383 Other fatigue: Secondary | ICD-10-CM

## 2018-10-02 DIAGNOSIS — R197 Diarrhea, unspecified: Secondary | ICD-10-CM | POA: Diagnosis not present

## 2018-10-02 NOTE — Patient Instructions (Addendum)
Try the SOLA low carb bread ([employer]  Frozen bread )  3 g/slice  Tastes great!   [employer] carb smart ice cream bars and fudgsicles   [employer] mashed cauliflower      Try slicing kohlrabi and sprinkle with olive oil and sea salt  ,  Bake with herb  (great potato substitute)    MAKE YOURSELF GO TO THE GYN FOR 30 MINUTES    You need a minimum of 60 ounces of water daily  Buy a chin strap to keep your mouth closed at night when you sleep

## 2018-10-02 NOTE — Progress Notes (Signed)
Subjective:  Patient ID: Julia Cooke, female    DOB: [birth year]  Age: 67 y.o. MRN: [id number]  CC: The primary encounter diagnosis was Diarrhea, unspecified type. Diagnoses of Encounter for immunization, Essential hypertension, Fatigue, unspecified type, Class 1 obesity due to excess calories without serious comorbidity with body mass index (BMI) of 34.0 to 34.9 in adult, Functional diarrhea, Impaired fasting glucose, and Snoring were also pertinent to this visit.  HPI Julia Cooke presents for follow up on chornic conditions  Obesity:  No weight loss since May .  NOT exercising or watching  Her diet .  Multiple excuses   Loose stools occurring 2-3 times daily  URGENCY, .  PRESENT for several months.  Just finished a course of antibiotics from abscessed tooth.  Took probiotics  For a few days during the antibiotics   No solid stools in months.    No prior colonoscopy,   No blood in stools,  No fevers  Or cramping   Has some mucous  With the stools sometimes looks like undigested food.   Dry mouth at night ,  Snores,  Wakes up too dry  .    Lab Results  Component Value Date   TSH 0.82 10/02/2018      Lab Results  Component Value Date   HGBA1C 4.9 01/20/2018     Outpatient Medications Prior to Visit  Medication Sig Dispense Refill  . ARIPiprazole (ABILIFY) 10 MG tablet TAKE 0.5 TABLETS BY MOUTH DAILY 15 tablet 2  . buPROPion (WELLBUTRIN XL) 300 MG 24 hr tablet Take 1 tablet (300 mg total) by mouth daily. 30 tablet 1  . clindamycin (CLEOCIN) 150 MG capsule     . hydrOXYzine (ATARAX/VISTARIL) 10 MG tablet Take 1-2 tablets (10-20 mg total) by mouth daily as needed for anxiety. Only for severe anxiety sx. 45 tablet 2  . ibuprofen (ADVIL,MOTRIN) 800 MG tablet     . losartan (COZAAR) 25 MG tablet TAKE ONE TABLET BY MOUTH DAILY 90 tablet 1  . losartan (COZAAR) 50 MG tablet Take 1 tablet (50 mg total) by mouth daily. NEED APPT FOR 90 DAY SUPPLY. OVERDUE SINCE January. 90 tablet 1    . metoprolol succinate (TOPROL-XL) 25 MG 24 hr tablet Take 1 tablet (25 mg total) by mouth daily. 90 tablet 1   No facility-administered medications prior to visit.     Review of Systems;  Patient denies headache, fevers, malaise, unintentional weight loss, skin rash, eye pain, sinus congestion and sinus pain, sore throat, dysphagia,  hemoptysis , cough, dyspnea, wheezing, chest pain, palpitations, orthopnea, edema, abdominal pain, nausea, melena, diarrhea, constipation, flank pain, dysuria, hematuria, urinary  Frequency, nocturia, numbness, tingling, seizures,  Focal weakness, Loss of consciousness,  Tremor, insomnia, depression, anxiety, and suicidal ideation.      Objective:  BP 130/86 (BP Location: Left Arm, Patient Position: Sitting, Cuff Size: Normal)   Pulse 75   Temp 98.1 F (36.7 C) (Oral)   Resp 14   Ht 5\' 1"  (1.549 m)   Wt 187 lb (84.8 kg)   SpO2 98%   BMI 35.33 kg/m   BP Readings from Last 3 Encounters:  10/02/18 130/86  08/31/18 (!) 145/101  05/02/18 134/82    Wt Readings from Last 3 Encounters:  10/02/18 187 lb (84.8 kg)  08/31/18 190 lb 9.6 oz (86.5 kg)  05/02/18 190 lb 6.4 oz (86.4 kg)    General appearance: alert, cooperative and appears stated age Ears: normal TM's and external ear canals  both ears Throat: lips, mucosa, and tongue normal; teeth and gums normal Neck: no adenopathy, no carotid bruit, supple, symmetrical, trachea midline and thyroid not enlarged, symmetric, no tenderness/mass/nodules Back: symmetric, no curvature. ROM normal. No CVA tenderness. Lungs: clear to auscultation bilaterally Heart: regular rate and rhythm, S1, S2 normal, no murmur, click, rub or gallop Abdomen: soft, non-tender; bowel sounds normal; no masses,  no organomegaly Pulses: 2+ and symmetric Skin: Skin color, texture, turgor normal. No rashes or lesions Lymph nodes: Cervical, supraclavicular, and axillary nodes normal.  Lab Results  Component Value Date   HGBA1C  4.9 01/20/2018   HGBA1C 4.8 12/23/2016   HGBA1C 4.8 04/10/2014    Lab Results  Component Value Date   CREATININE 0.92 10/02/2018   CREATININE 0.96 01/20/2018   CREATININE 0.75 12/23/2016    .last  Lab Results  Component Value Date   WBC 7.2 01/20/2018   HGB 15.3 01/20/2018   HCT 44.6 01/20/2018   PLT 232 01/20/2018   GLUCOSE 87 10/02/2018   CHOL 221 (H) 01/20/2018   TRIG 131 01/20/2018   HDL 53 01/20/2018   LDLDIRECT 162.0 12/23/2016   LDLCALC 142 (H) 01/20/2018   ALT 23 10/02/2018   AST 17 10/02/2018   NA 140 10/02/2018   K 4.3 10/02/2018   CL 103 10/02/2018   CREATININE 0.92 10/02/2018   BUN 17 10/02/2018   CO2 27 10/02/2018   TSH 0.82 10/02/2018   HGBA1C 4.9 01/20/2018   MICROALBUR 0.7 12/23/2016    Mm 3d Screen Breast Bilateral  Result Date: 09/13/2018 CLINICAL DATA:  Screening. EXAM: DIGITAL SCREENING BILATERAL MAMMOGRAM WITH TOMO AND CAD COMPARISON:  Previous exam(s). ACR Breast Density Category b: There are scattered areas of fibroglandular density. FINDINGS: There are no findings suspicious for malignancy. Images were processed with CAD. IMPRESSION: No mammographic evidence of malignancy. A result letter of this screening mammogram will be mailed directly to the patient. RECOMMENDATION: Screening mammogram in one year. (Code:SM-B-01Y) BI-RADS CATEGORY  1: Negative. Electronically Signed   By: Lillia Mountain M.D.   On: 09/13/2018 09:08    Assessment & Plan:   Problem List Items Addressed This Visit    Essential hypertension    Well controlled on current regimen. Renal function stable, no changes today.  Lab Results  Component Value Date   CREATININE 0.92 10/02/2018   Lab Results  Component Value Date   NA 140 10/02/2018   K 4.3 10/02/2018   CL 103 10/02/2018   CO2 27 10/02/2018         Relevant Orders   Comprehensive metabolic panel (Completed)   Fatigue   Relevant Orders   TSH (Completed)   Functional diarrhea    Presumed, based on history ,  chronicity, absence of blood in stools and unintentional weight loss .  Given recent abx use,  Need to rule out c dificile and other pathogens      Impaired fasting glucose    She has no evidence of prediabetes by a1c  Lab Results  Component Value Date   HGBA1C 4.9 01/20/2018         Obesity    I spent 15 minutes addressing her inability to lower her BMI by low GI diet alone and recommended intermittent fasting and more strenuous  exercise a minimum of 5 days per week.       Snoring    Advised to wear a chin strap to prevent snoring.        Other Visit Diagnoses  Diarrhea, unspecified type    -  Primary   Relevant Orders   Clostridium difficile Toxin B, Qualitative, Real-Time PCR([facility])   Encounter for immunization       Relevant Orders   Flu vaccine HIGH DOSE PF (Completed)     A total of 25 minutes of face to face time was spent with patient more than half of which was spent in counselling and coordination of care .  I am having Julia Cooke maintain her losartan, ARIPiprazole, buPROPion, hydrOXYzine, losartan, metoprolol succinate, clindamycin, and ibuprofen.  No orders of the defined types were placed in this encounter.   There are no discontinued medications.  Follow-up: Return in about 6 months (around 04/02/2019).   Crecencio Mc, MD

## 2018-10-03 ENCOUNTER — Other Ambulatory Visit (INDEPENDENT_AMBULATORY_CARE_PROVIDER_SITE_OTHER): Payer: PPO

## 2018-10-03 DIAGNOSIS — R197 Diarrhea, unspecified: Secondary | ICD-10-CM | POA: Diagnosis not present

## 2018-10-03 DIAGNOSIS — R0683 Snoring: Secondary | ICD-10-CM | POA: Insufficient documentation

## 2018-10-03 DIAGNOSIS — K591 Functional diarrhea: Secondary | ICD-10-CM | POA: Insufficient documentation

## 2018-10-03 LAB — COMPREHENSIVE METABOLIC PANEL
ALBUMIN: 4.3 g/dL (ref 3.5–5.2)
ALT: 23 U/L (ref 0–35)
AST: 17 U/L (ref 0–37)
Alkaline Phosphatase: 66 U/L (ref 39–117)
BILIRUBIN TOTAL: 0.8 mg/dL (ref 0.2–1.2)
BUN: 17 mg/dL (ref 6–23)
CALCIUM: 9.7 mg/dL (ref 8.4–10.5)
CO2: 27 meq/L (ref 19–32)
CREATININE: 0.92 mg/dL (ref 0.40–1.20)
Chloride: 103 mEq/L (ref 96–112)
GFR: 64.75 mL/min (ref >60.00)
Glucose, Bld: 87 mg/dL (ref 70–99)
Potassium: 4.3 mEq/L (ref 3.5–5.1)
Sodium: 140 mEq/L (ref 135–145)
Total Protein: 7.1 g/dL (ref 6.0–8.3)

## 2018-10-03 LAB — TSH: TSH: 0.82 u[IU]/mL (ref 0.35–4.50)

## 2018-10-03 NOTE — Assessment & Plan Note (Signed)
I spent 15 minutes addressing her inability to lower her BMI by low GI diet alone and recommended intermittent fasting and more strenuous  exercise a minimum of 5 days per week.

## 2018-10-03 NOTE — Addendum Note (Signed)
Addended by: Leeanne Rio on: 10/03/2018 11:11 AM   Modules accepted: Orders

## 2018-10-03 NOTE — Assessment & Plan Note (Signed)
Advised to wear a chin strap to prevent snoring.

## 2018-10-03 NOTE — Assessment & Plan Note (Signed)
She has no evidence of prediabetes by a1c  Lab Results  Component Value Date   HGBA1C 4.9 01/20/2018

## 2018-10-03 NOTE — Assessment & Plan Note (Signed)
Well controlled on current regimen. Renal function stable, no changes today.  Lab Results  Component Value Date   CREATININE 0.92 10/02/2018   Lab Results  Component Value Date   NA 140 10/02/2018   K 4.3 10/02/2018   CL 103 10/02/2018   CO2 27 10/02/2018

## 2018-10-03 NOTE — Assessment & Plan Note (Signed)
Presumed, based on history , chronicity, absence of blood in stools and unintentional weight loss .  Given recent abx use,  Need to rule out c dificile and other pathogens

## 2018-10-05 LAB — GASTROINTESTINAL PATHOGEN PANEL PCR
C. DIFFICILE TOX A/B, PCR: NOT DETECTED
CRYPTOSPORIDIUM, PCR: NOT DETECTED
Campylobacter, PCR: NOT DETECTED
E COLI (ETEC) LT/ST, PCR: NOT DETECTED
E COLI (STEC) STX1/STX2, PCR: NOT DETECTED
E coli 0157, PCR: NOT DETECTED
Giardia lamblia, PCR: NOT DETECTED
Norovirus, PCR: NOT DETECTED
ROTAVIRUS, PCR: NOT DETECTED
Salmonella, PCR: NOT DETECTED
Shigella, PCR: NOT DETECTED

## 2018-10-05 LAB — CLOSTRIDIUM DIFFICILE TOXIN B, QUALITATIVE, REAL-TIME PCR: CDIFFPCR: NOT DETECTED

## 2018-10-17 ENCOUNTER — Encounter: Payer: Self-pay | Admitting: Psychiatry

## 2018-10-17 ENCOUNTER — Other Ambulatory Visit: Payer: Self-pay

## 2018-10-17 ENCOUNTER — Ambulatory Visit: Payer: PPO | Admitting: Psychiatry

## 2018-10-17 VITALS — BP 145/84 | HR 77 | Temp 97.9°F | Wt 188.8 lb

## 2018-10-17 DIAGNOSIS — F3131 Bipolar disorder, current episode depressed, mild: Secondary | ICD-10-CM | POA: Diagnosis not present

## 2018-10-17 MED ORDER — ARIPIPRAZOLE 2 MG PO TABS: 2.0000 mg | ORAL_TABLET | Freq: Every day | ORAL | 2 refills | Status: DC

## 2018-10-17 MED ORDER — BUPROPION HCL ER (XL) 300 MG PO TB24: 300.0000 mg | ORAL_TABLET | Freq: Every day | ORAL | 2 refills | Status: DC

## 2018-10-17 NOTE — Progress Notes (Signed)
Julia Cooke  10/17/2018 4:56 PM Julia Cooke  MRN:  [id number]  Chief Complaint: 'I am here for follow up.' Chief Complaint    Follow-up; Medication Refill     HPI: Julia Cooke is a 67 year old Caucasian female, married, on SSI, lives in [place], has a history of bipolar disorder, presented to the clinic today for a follow-up visit.  Patient today reports she is tolerating the Wellbutrin well.  She reports she has noticed some improvement in her depressive symptoms. Discussed with patient she needs to give it more time.  She denies any side effects like agitation or irritability or sleep problems.  Patient reports she has not been very compliant with her Abilify as prescribed due to the co-pay.  She reports she would like to go down to Abilify 2 mg which is more affordable for her.  Discussed with patient about starting another mood stabilizer like Lamictal if she needs to get off of Abilify.  Also discussed to monitor herself closely for manic or hypomanic symptoms.  She denies any suicidality.  Patient denies any perceptual disturbances.  Patient denies any other concerns today. Visit Diagnosis:    ICD-10-CM   1. Bipolar disorder, current episode depressed, mild (HCC) F31.31 buPROPion (WELLBUTRIN XL) 300 MG 24 hr tablet    ARIPiprazole (ABILIFY) 2 MG tablet   improving    Past Psychiatric History: Reviewed past psychiatric history from my progress Cooke on 05/02/2018  Past Medical History:  Past Medical History:  Diagnosis Date  . Bipolar disorder ([place])    takes Abilify  . Depression   . Dysrhythmia 10/2016   LBBB on ekg...nothing to compare it to.  Marland Kitchen Heart murmur   . Hypertension   . Nephrolithiasis 2000   s/p lithotripsy  . 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 EXTRACORPOREAL SHOCK WAVE LITHOTRIPSY Left 01/06/2017   Procedure: EXTRACORPOREAL SHOCK WAVE LITHOTRIPSY (ESWL);  Surgeon: Hollice Espy, MD;  Location:  [facility] ORS;  Service: Urology;  Laterality: Left;  . LITHOTRIPSY  2000    Family Psychiatric History: Have reviewed family psychiatric history from my progress Cooke on 05/02/2018  Family History:  Family History  Problem Relation Age of Onset  . Hyperlipidemia Mother   . Stroke Mother   . Hypertension Mother   . Hyperlipidemia Father   . Hypertension Father   . Cancer Sister        breast  . Breast cancer Sister 44  . Heart disease Brother        myocardial infarction  . Heart attack Brother   . Hypertension Brother   . Fibromyalgia Sister   . Hypertension Sister   . Heart Problems Sister   . Hypertension Sister   . Prostate cancer Neg Hx   . Kidney cancer Neg Hx   . Bladder Cancer Neg Hx     Social History: Have reviewed social history from my progress Cooke on 05/02/2018 Social History   Socioeconomic History  . Marital status: Married    Spouse name: richard  . Number of children: 3  . Years of education: Not on file  . Highest education level: Some college, no degree  Occupational History    Comment: retired  Scientific laboratory technician  . Financial resource strain: Not hard at all  . Food insecurity:    Worry: Never true    Inability: Never true  . Transportation needs:    Medical: No  Non-medical: No  Tobacco Use  . Smoking status: Never Smoker  . Smokeless tobacco: Never Used  Substance and Sexual Activity  . Alcohol use: No    Alcohol/week: 0.0 - 2.0 standard drinks    Comment: 2-3 times weekly  . Drug use: No  . Sexual activity: Yes    Birth control/protection: Post-menopausal, None  Lifestyle  . Physical activity:    Days per week: 4 days    Minutes per session: 120 min  . Stress: To some extent  Relationships  . Social connections:    Talks on phone: More than three times a week    Gets together: More than three times a week    Attends religious service: More than 4 times per year    Active member of club or organization: Yes    Attends meetings of clubs  or organizations: More than 4 times per year    Relationship status: Married  Other Topics Concern  . Not on file  Social History Narrative   Patient lives at home with her husband of 36 years. They have 3 adult children (2 boys, 1 girl). They recently moved back to [place] in January. 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    Allergies:  Allergies  Allergen Reactions  . Penicillins Swelling    As a child    Metabolic Disorder Labs: Lab Results  Component Value Date   HGBA1C 4.9 01/20/2018   MPG 94 01/20/2018   Lab Results  Component Value Date   PROLACTIN 3.2 (L) 01/20/2018   Lab Results  Component Value Date   CHOL 221 (H) 01/20/2018   TRIG 131 01/20/2018   HDL 53 01/20/2018   CHOLHDL 4.2 01/20/2018   VLDL 26 01/20/2018   LDLCALC 142 (H) 01/20/2018   LDLCALC 157 (H) 12/23/2016   Lab Results  Component Value Date   TSH 0.82 10/02/2018   TSH 0.68 12/23/2016    Therapeutic Level Labs: No results found for: LITHIUM No results found for: VALPROATE No components found for:  CBMZ  Current Medications: Current Outpatient Medications  Medication Sig Dispense Refill  . buPROPion (WELLBUTRIN XL) 300 MG 24 hr tablet Take 1 tablet (300 mg total) by mouth daily. 30 tablet 2  . clindamycin (CLEOCIN) 150 MG capsule     . hydrOXYzine (ATARAX/VISTARIL) 10 MG tablet Take 1-2 tablets (10-20 mg total) by mouth daily as needed for anxiety. Only for severe anxiety sx. 45 tablet 2  . ibuprofen (ADVIL,MOTRIN) 800 MG tablet     . losartan (COZAAR) 50 MG tablet Take 1 tablet (50 mg total) by mouth daily. NEED APPT FOR 90 DAY SUPPLY. OVERDUE SINCE January. 90 tablet 1  . metoprolol succinate (TOPROL-XL) 25 MG 24 hr tablet Take 1 tablet (25 mg total) by mouth daily. 90 tablet 1  . ARIPiprazole (ABILIFY) 2 MG tablet Take 1 tablet (2 mg total) by mouth daily. 30 tablet 2   No current facility-administered  medications for this visit.      Musculoskeletal: Strength & Muscle Tone: within normal limits Gait & Station: normal Patient leans: N/A  Psychiatric Specialty Exam: Review of Systems  Psychiatric/Behavioral: Positive for depression (improving).  All other systems reviewed and are negative.   Blood pressure (!) 145/84, pulse 77, temperature 97.9 F (36.6 C), temperature source Oral, weight 188 lb 12.8 oz (85.6 kg).Body mass index is 35.67 kg/m.  General Appearance: Casual  Eye Contact:  Fair  Speech:  Clear and Coherent  Volume:  Normal  Mood:  Depressed improving  Affect:  Congruent  Thought Process:  Goal Directed and Descriptions of Associations: Intact  Orientation:  Full (Time, Place, and Person)  Thought Content: Logical   Suicidal Thoughts:  No  Homicidal Thoughts:  No  Memory:  Immediate;   Fair Recent;   Fair Remote;   Fair  Judgement:  Fair  Insight:  Fair  Psychomotor Activity:  Normal  Concentration:  Concentration: Fair and Attention Span: Fair  Recall:  [employer] of Knowledge: Fair  Language: Fair  Akathisia:  No  Handed:  Right  AIMS (if indicated):denies tremors ,rigidity, stiffness  Assets:  Communication Skills Desire for Improvement Social Support  ADL's:  Intact  Cognition: WNL  Sleep:  Fair   Screenings: PHQ2-9     Office Visit from 10/02/2018 in [place] Office Visit from 12/23/2016 in [place]  PHQ-2 Total Score  1  1  PHQ-9 Total Score  9  -       Assessment and Plan: Julia Cooke is a 67 year old Caucasian female who has a history of bipolar disorder, presented to the clinic today for a follow-up visit.  Patient is tolerating the medication changes well.  Patient would like the Abilify dosage to be reduced since she has financial issues and she has been noncompliant.  Discussed ongoing medication changes with patient.  Plan Bipolar disorder type I Reduce Abilify to 2 mg p.o. daily Continue  Wellbutrin XL 300 mg p.o. daily. Continue hydroxyzine 10-20 mg p.o. daily as needed for severe anxiety symptoms  Elevated blood pressure Discussed with patient to keep track of her blood pressure and follow-up with her primary medical doctor.  Follow-up in clinic in 2-3 months or sooner if needed.  More than 50 % of the time was spent for psychoeducation and supportive psychotherapy and care coordination.  This Cooke was generated in part or whole with voice recognition software. Voice recognition is usually quite accurate but there are transcription errors that can and very often do occur. I apologize for any typographical errors that were not detected and corrected.       Ursula Alert, MD 10/17/2018, 4:56 PM

## 2018-10-20 ENCOUNTER — Telehealth: Payer: Self-pay

## 2018-10-20 MED ORDER — ARIPIPRAZOLE 5 MG PO TABS: 2.5000 mg | ORAL_TABLET | Freq: Every day | ORAL | 3 refills | Status: DC

## 2018-10-20 NOTE — Telephone Encounter (Signed)
pt called stated that the abilify would be cheaper if it is a 5mg  take 1/2 tablet daily according to what the pharmacy told her.   Pt states that the abilify you sent in she can not afford.

## 2018-10-20 NOTE — Telephone Encounter (Signed)
I sent Abilify 5 mg ( take half) to pharmacy with refills .

## 2019-01-16 ENCOUNTER — Ambulatory Visit: Payer: PPO | Admitting: Psychiatry

## 2019-01-30 ENCOUNTER — Ambulatory Visit: Payer: Self-pay | Admitting: *Deleted

## 2019-01-30 NOTE — Telephone Encounter (Signed)
Pt has an appt w/ Philis Nettle, NP on 02/01/19 @ 8:00 am.  Is this ok or do you want pt to be seen sooner?

## 2019-01-30 NOTE — Telephone Encounter (Signed)
Called and spoke to pt.  Pt stated that she is still having foot pain throughout the day but is worse in the evening.  Pt said that her toenail from the injured foot is still attached to the nail bed but is hanging off.  Pt said that there is a clear and yellow discharge draining from toe.  Informed pt that Dr. Derrel Nip wants pt to go to [facility] walk in clinic for urgent evaluation of possible fractures and for a tetanus vaccine. Instructed pt to come to office ASAP for tetanus if [facility] cannot give her a tetanus vaccine there.  Instructions given to pt per Dr. Lupita Dawn note.  Pt stated that she was sure that she was given a tetanus shot at either her last ov or 2nd to last ov.  Pt feels that it wasn't recorded. Pt said that she would not go to [facility] today but would try to go tomorrow.  Per pt request, gave her address and phone number and hours of operation of [facility].  Pt will keep her appt scheduled for Thurs. with Philis Nettle, NP as well. Informed pt to go to UC or ED if symptoms worsen such as fever, discharge, signs of infection in toe before she goes to [facility] tomorrow.

## 2019-01-30 NOTE — Telephone Encounter (Signed)
She should be directed to [facility] walk in clinic for urgent evaluation of possible fractures and if they cannot give hr a tetanus vaccine she can come here to get it done asao

## 2019-01-30 NOTE — Telephone Encounter (Signed)
Pt called with having an injury to her toe last week. A ramp door was down and she stepped on it when her left foot was underneath it. She crushed that foot. She did have some bleeding. Now looks like her big toe nail is lifting up. It now aches. Has smelly drainage. She stated that she received a tetanus injection not that long ago.  Denies fever.  Feels a numbing ache  in her leg also. Appointment scheduled per protocol. Advised to call back if she feels like the toe is getting worst and go to an urgent care. Pt voiced understanding. After pt hung up. It was found in the chart the patient last tetanus was 12/12/70. Called patient back to let her know. She is still stating that this medication was given in the office not that long ago. If she has not had a tetanus, then she would need to be seen sooner than appointment. Pt voiced understanding. Routing to flow at [facility] at [facility] for review.  Reason for Disposition . [1] Toenail comes off or is almost off AND [2] follows old injury AND [3] wants doctor to remove it  Answer Assessment - Initial Assessment Questions 1. MECHANISM: "How did the injury happen?" (e.g., twisting injury, direct blow)      Utility ramp door came down and was under the foot and then stepped on the ramp and crushed the foot 2. ONSET: "When did the injury happen?" (Minutes or hours ago)      About a week ago 3. LOCATION: "Where is the injury located?"      Left big toe 4. APPEARANCE of INJURY: "What does the injury look like?"      Toe nail is loose 5. WEIGHT-BEARING: "Can you put weight on that foot?" "Can you walk (four steps or more)?"       yes 6. SIZE: For cuts, bruises, or swelling, ask: "How large is it?" (e.g., inches or centimeters;  entire joint)      Bruised and swelling and weeping fluid yellowish and smells bad 7. PAIN: "Is there pain?" If so, ask: "How bad is the pain?"    (e.g., Scale 1-10; or mild, moderate, severe)     Aching, # about  4 8. TETANUS: For any breaks in the skin, ask: "When was the last tetanus booster?"     Yes, had tetanus shot 9. OTHER SYMPTOMS: "Do you have any other symptoms?"      Numbing ache of the left leg 10. PREGNANCY: "Is there any chance you are pregnant?" "When was your last menstrual period?"       no  Protocols used: TOE INJURY-A-AH, FOOT AND ANKLE INJURY-A-AH

## 2019-01-31 DIAGNOSIS — L03039 Cellulitis of unspecified toe: Secondary | ICD-10-CM | POA: Diagnosis not present

## 2019-02-01 ENCOUNTER — Ambulatory Visit: Payer: PPO | Admitting: Family Medicine

## 2019-02-05 DIAGNOSIS — L03039 Cellulitis of unspecified toe: Secondary | ICD-10-CM | POA: Diagnosis not present

## 2019-02-13 ENCOUNTER — Telehealth: Payer: Self-pay

## 2019-02-13 ENCOUNTER — Ambulatory Visit: Payer: PPO | Admitting: Psychiatry

## 2019-02-13 ENCOUNTER — Telehealth: Payer: Self-pay | Admitting: Psychiatry

## 2019-02-13 DIAGNOSIS — F3131 Bipolar disorder, current episode depressed, mild: Secondary | ICD-10-CM

## 2019-02-13 MED ORDER — BUPROPION HCL ER (XL) 300 MG PO TB24: 300.0000 mg | ORAL_TABLET | Freq: Every day | ORAL | 3 refills | Status: DC

## 2019-02-13 MED ORDER — HYDROXYZINE HCL 10 MG PO TABS: 10.0000 mg | ORAL_TABLET | Freq: Every day | ORAL | 3 refills | Status: DC | PRN

## 2019-02-13 MED ORDER — ARIPIPRAZOLE 5 MG PO TABS: 2.5000 mg | ORAL_TABLET | Freq: Every day | ORAL | 3 refills | Status: DC

## 2019-02-13 NOTE — Telephone Encounter (Signed)
left message that patient would need to make an appt to be seen and that is would check with doctor to see if enough medication can be given to get to  appointment . pt was also left a message that if she did not keep the appt she would be dismissed as a patient.

## 2019-02-13 NOTE — Telephone Encounter (Signed)
Set meds to pharmacy

## 2019-02-13 NOTE — Telephone Encounter (Signed)
pt called left a message she needs a refill on abilify.

## 2019-02-13 NOTE — Telephone Encounter (Signed)
I have already sent refills for Abilify to pharmacy today since she was worried about coming here due to the Virus scare. Please ask to schedule an appointment at some point. Please let patient know.

## 2019-02-15 ENCOUNTER — Ambulatory Visit: Payer: PPO | Admitting: Psychiatry

## 2019-02-26 ENCOUNTER — Other Ambulatory Visit: Payer: Self-pay | Admitting: Radiology

## 2019-02-26 DIAGNOSIS — Z87442 Personal history of urinary calculi: Secondary | ICD-10-CM

## 2019-03-08 ENCOUNTER — Ambulatory Visit: Payer: Self-pay | Admitting: Urology

## 2019-03-08 ENCOUNTER — Telehealth: Payer: Self-pay | Admitting: Urology

## 2019-03-20 ENCOUNTER — Other Ambulatory Visit: Payer: Self-pay

## 2019-03-20 ENCOUNTER — Ambulatory Visit (INDEPENDENT_AMBULATORY_CARE_PROVIDER_SITE_OTHER): Payer: PPO | Admitting: Internal Medicine

## 2019-03-20 DIAGNOSIS — Z6834 Body mass index (BMI) 34.0-34.9, adult: Secondary | ICD-10-CM

## 2019-03-20 DIAGNOSIS — E785 Hyperlipidemia, unspecified: Secondary | ICD-10-CM | POA: Diagnosis not present

## 2019-03-20 DIAGNOSIS — E6609 Other obesity due to excess calories: Secondary | ICD-10-CM | POA: Diagnosis not present

## 2019-03-20 DIAGNOSIS — Z7189 Other specified counseling: Secondary | ICD-10-CM | POA: Diagnosis not present

## 2019-03-20 DIAGNOSIS — L03032 Cellulitis of left toe: Secondary | ICD-10-CM | POA: Diagnosis not present

## 2019-03-20 DIAGNOSIS — I10 Essential (primary) hypertension: Secondary | ICD-10-CM

## 2019-03-20 DIAGNOSIS — E559 Vitamin D deficiency, unspecified: Secondary | ICD-10-CM | POA: Diagnosis not present

## 2019-03-20 NOTE — Assessment & Plan Note (Signed)
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

## 2019-03-20 NOTE — Assessment & Plan Note (Signed)
She has been eating everything in sight due to boredom from the restricted travel. Reviewed her previous success at weight loss,  Her goal weight for BMI < 30 . I have addressed  BMI and recommended wt loss of 10% of body weigh over the next 6 months using a low glycemic index diet and regular exercise a minimum of 5 days per week.

## 2019-03-20 NOTE — Progress Notes (Addendum)
Virtual Visit via  [email or web address]  This visit type was conducted due to national recommendations for restrictions regarding the COVID-19 pandemic (e.g. social distancing).  This format is felt to be most appropriate for this patient at this time.  All issues noted in this document were discussed and addressed.  No physical exam was performed (except for noted visual exam findings with Video Visits).   I connected with@ on 03/20/19 at  1:00 PM EDT by a video enabled telemedicine application  and verified that I am speaking with the correct person using two identifiers. Location patient: home Location provider: work or home office Persons participating in the virtual visit: patient, provider  I discussed the limitations, risks, security and privacy concerns of performing an evaluation and management service by telephone and the availability of in person appointments. I also discussed with the patient that there may be a patient responsible charge related to this service. The patient expressed understanding and agreed to proceed.  Reason for visit: 6 month follow up on hypertension, obesity   HPI:   The patient has no signs or symptoms of COVID 19 infection (fever, cough, sore throat  or shortness of breath beyond what is typical for patient).  Patient denies contact with other persons with the above mentioned symptoms or with anyone confirmed to have COVID 19 . She is wearing a mask when she goes out.  HTN:   Patient is taking her medications as prescribed and notes no adverse effects.  SHE owns a meter but has not been checking her blood pressure fo rthe past 6 months,   She is not avoiding added salt in her diet and has gained weight,  She is walking regularly for about an hour daily   .  She was treated for post traumatic paronychia with clindamycin  Several weeks ago to resolution,  Lost the nail .  No diarrhea,  Not taking a probiotic    ROS: See pertinent positives and negatives per HPI.  Past  Medical History:  Diagnosis Date  . Bipolar disorder ([place])    takes Abilify  . Depression   . Dysrhythmia 10/2016   LBBB on ekg...nothing to compare it to.  Marland Kitchen Heart murmur   . Hypertension   . Nephrolithiasis 2000   s/p lithotripsy  . 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 EXTRACORPOREAL SHOCK WAVE LITHOTRIPSY Left 01/06/2017   Procedure: EXTRACORPOREAL SHOCK WAVE LITHOTRIPSY (ESWL);  Surgeon: Hollice Espy, MD;  Location: [facility] ORS;  Service: Urology;  Laterality: Left;  . LITHOTRIPSY  2000    Family History  Problem Relation Age of Onset  . Hyperlipidemia Mother   . Stroke Mother   . Hypertension Mother   . Hyperlipidemia Father   . Hypertension Father   . Cancer Sister        breast  . Breast cancer Sister 37  . Heart disease Brother        myocardial infarction  . Heart attack Brother   . Hypertension Brother   . Fibromyalgia Sister   . Hypertension Sister   . Heart Problems Sister   . Hypertension Sister   . Prostate cancer Neg Hx   . Kidney cancer Neg Hx   . Bladder Cancer Neg Hx     SOCIAL HX: married,  No alcohol or tobacco  Current Outpatient Medications:  .  ARIPiprazole (ABILIFY) 5 MG tablet, Take 0.5 tablets (2.5 mg total) by mouth daily.,  Disp: 15 tablet, Rfl: 3 .  buPROPion (WELLBUTRIN XL) 300 MG 24 hr tablet, Take 1 tablet (300 mg total) by mouth daily., Disp: 30 tablet, Rfl: 3 .  hydrOXYzine (ATARAX/VISTARIL) 10 MG tablet, Take 1-2 tablets (10-20 mg total) by mouth daily as needed for anxiety. Only for severe anxiety sx., Disp: 45 tablet, Rfl: 3 .  ibuprofen (ADVIL,MOTRIN) 800 MG tablet, , Disp: , Rfl:  .  losartan (COZAAR) 50 MG tablet, Take 1 tablet (50 mg total) by mouth daily. NEED APPT FOR 90 DAY SUPPLY. OVERDUE SINCE January., Disp: 90 tablet, Rfl: 1 .  metoprolol succinate (TOPROL-XL) 25 MG 24 hr tablet, Take 1 tablet (25 mg total) by mouth daily., Disp: 90 tablet, Rfl: 1  EXAM:  VITALS  per patient if applicable:  GENERAL: alert, oriented, appears well and in no acute distress  HEENT: atraumatic, conjunttiva clear, no obvious abnormalities on inspection of external nose and ears  NECK: normal movements of the head and neck  LUNGS: on inspection no signs of respiratory distress, breathing rate appears normal, no obvious gross SOB, gasping or wheezing  CV: no obvious cyanosis  MS: moves all visible extremities without noticeable abnormality  PSYCH/NEURO: pleasant and cooperative, no obvious depression or anxiety, speech and thought processing grossly intact  ASSESSMENT AND PLAN:  Discussed the following assessment and plan: Essential hypertension Patient is taking her medications as prescribed and notes no adverse effects.  Has not been checking readings at home bu does have a monitor,  Advised to submit 4-5 readings over he next few weeks   Obesity She has been eating everything in sight due to boredom from the restricted travel. Reviewed her previous success at weight loss,  Her goal weight for BMI < 30 . I have addressed  BMI and recommended wt loss of 10% of body weigh over the next 6 months using a low glycemic index diet and regular exercise a minimum of 5 days per week.    Educated About Covid-19 Virus Infection Educated patient on the signs and symptoms of COVID-19 infection and ways to avoid the viral infection including wearing a mask when outside, washing hands frequently with soap and water,  using hand sanitizer if unable to wash, avoiding touching face,  staying at home and limiting visitors,  and avoiding contact with people coming in and out of home.  Reminded patient to call office with questions/concerns.  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 ARIPiprazole (ABILIFY) 5 MG tablet Take 0.5 tablets (2.5 mg total) by mouth daily.  Marland Kitchen buPROPion (WELLBUTRIN XL) 300 MG 24 hr tablet Take 1 tablet (300 mg total) by mouth daily.  . hydrOXYzine (ATARAX/VISTARIL) 10 MG tablet Take 1-2 tablets (10-20 mg total) by mouth daily as needed for anxiety. Only for severe anxiety sx.  . ibuprofen (ADVIL,MOTRIN) 800 MG tablet   . losartan (COZAAR) 50 MG tablet Take 1 tablet (50 mg total) by mouth daily. NEED APPT FOR 90 DAY SUPPLY. OVERDUE SINCE January.  . metoprolol succinate (TOPROL-XL) 25 MG 24 hr tablet Take 1 tablet (25 mg total) by mouth daily.  . [DISCONTINUED] clindamycin (CLEOCIN) 150 MG capsule    No facility-administered encounter medications on file as of 03/20/2019.  I discussed the assessment and treatment plan with the patient. The patient was provided an opportunity to ask questions and all were answered. The patient agreed with the plan and demonstrated an understanding of the instructions.   The patient was advised to call back or seek an in-person evaluation if the symptoms worsen or if the condition fails to improve as anticipated.  I provided 25  minutes of non-face-to-face time during this encounter.   Crecencio Mc, MD

## 2019-03-20 NOTE — Assessment & Plan Note (Signed)
Patient is taking her medications as prescribed and notes no adverse effects.  Has not been checking readings at home bu does have a monitor,  Advised to submit 4-5 readings over he next few weeks

## 2019-03-20 NOTE — Assessment & Plan Note (Signed)
Educated patient on the signs and symptoms of COVID-19 infection and ways to avoid the viral infection including wearing a mask when outside, washing hands frequently with soap and water,  using hand sanitizer if unable to wash, avoiding touching face,  staying at home and limiting visitors,  and avoiding contact with people coming in and out of home.  Reminded patient to call office with questions/concerns.  The importance of social distancing was discussed today

## 2019-03-26 ENCOUNTER — Encounter: Payer: Self-pay | Admitting: Family Medicine

## 2019-03-26 ENCOUNTER — Encounter: Payer: Self-pay | Admitting: Psychiatry

## 2019-03-26 ENCOUNTER — Ambulatory Visit (INDEPENDENT_AMBULATORY_CARE_PROVIDER_SITE_OTHER): Payer: PPO | Admitting: Family Medicine

## 2019-03-26 ENCOUNTER — Ambulatory Visit (INDEPENDENT_AMBULATORY_CARE_PROVIDER_SITE_OTHER): Payer: PPO | Admitting: Psychiatry

## 2019-03-26 ENCOUNTER — Other Ambulatory Visit: Payer: Self-pay

## 2019-03-26 DIAGNOSIS — S30860A Insect bite (nonvenomous) of lower back and pelvis, initial encounter: Secondary | ICD-10-CM

## 2019-03-26 DIAGNOSIS — W57XXXA Bitten or stung by nonvenomous insect and other nonvenomous arthropods, initial encounter: Secondary | ICD-10-CM

## 2019-03-26 DIAGNOSIS — F3131 Bipolar disorder, current episode depressed, mild: Secondary | ICD-10-CM | POA: Diagnosis not present

## 2019-03-26 DIAGNOSIS — F5105 Insomnia due to other mental disorder: Secondary | ICD-10-CM | POA: Diagnosis not present

## 2019-03-26 MED ORDER — TRAZODONE HCL 50 MG PO TABS: 25.0000 mg | ORAL_TABLET | Freq: Every evening | ORAL | 3 refills | Status: DC | PRN

## 2019-03-26 MED ORDER — DOXYCYCLINE HYCLATE 100 MG PO TABS: 100.0000 mg | ORAL_TABLET | Freq: Two times a day (BID) | ORAL | 0 refills | Status: DC

## 2019-03-26 NOTE — Progress Notes (Addendum)
Virtual Visit via Video Note  I connected with Julia Cooke on 03/26/19 at  9:30 AM EDT by a video enabled telemedicine application and verified that I am speaking with the correct person using two identifiers.   I discussed the limitations of evaluation and management by telemedicine and the availability of in person appointments. The patient expressed understanding and agreed to proceed.   I discussed the assessment and treatment plan with the patient. The patient was provided an opportunity to ask questions and all were answered. The patient agreed with the plan and demonstrated an understanding of the instructions.   The patient was advised to call back or seek an in-person evaluation if the symptoms worsen or if the condition fails to improve as anticipated.   [facility] MD OP Progress Note  03/26/2019 9:48 AM Julia Cooke Baily Hovanec  MRN:  [id number]  Chief Complaint:  Chief Complaint    Follow-up     HPI: Julia Cooke is a 68 year old Caucasian female, married, on SSI, lives in [place], has a history of bipolar disorder, was evaluated by telemedicine today.  Patient today reports she is currently living with her daughter.  Her daughter bought a new house and she and her husband currently lives with her.  She hence has been busy with projects around the house.  She reports most of the days it keeps her occupied.  She has been working  in the yard.  She also has been walking her dog.  She however continues to have some anxiety symptoms on and off.  She reports it is due to the COVID-19 outbreak.  She reports she has been taking the hydroxyzine which helps.  She also reports sleep problems.  She is interested in a sleep medication.  Discussed trazodone.  She agrees with plan.  She continues to take Abilify and Wellbutrin.  She is on 2.5 mg Abilify at this time.  She reports she wants to stay on that dosage for now.  She denies any side effects to the medication.  She appeared to be alert, oriented to  person place and situation today.  Patient has good support system from her family.  She denies any other concerns today. Visit Diagnosis:    ICD-10-CM   1. Bipolar disorder, current episode depressed, mild ([place]) F31.31   2. Insomnia due to mental condition F51.05 traZODone (DESYREL) 50 MG tablet    Past Psychiatric History: Reviewed past psychiatric history from my progress note on 05/02/2018.  Past Medical History:  Past Medical History:  Diagnosis Date  . Bipolar disorder ([place])    takes Abilify  . Depression   . Dysrhythmia 10/2016   LBBB on ekg...nothing to compare it to.  Marland Kitchen Heart murmur   . Hypertension   . Nephrolithiasis 2000   s/p lithotripsy  . 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 EXTRACORPOREAL SHOCK WAVE LITHOTRIPSY Left 01/06/2017   Procedure: EXTRACORPOREAL SHOCK WAVE LITHOTRIPSY (ESWL);  Surgeon: Hollice Espy, MD;  Location: [facility] ORS;  Service: Urology;  Laterality: Left;  . LITHOTRIPSY  2000    Family Psychiatric History: Reviewed family psychiatric history from my progress note on 05/02/2018  Family History:  Family History  Problem Relation Age of Onset  . Hyperlipidemia Mother   . Stroke Mother   . Hypertension Mother   . Hyperlipidemia Father   . Hypertension Father   . Cancer Sister        breast  .  Breast cancer Sister 65  . Heart disease Brother        myocardial infarction  . Heart attack Brother   . Hypertension Brother   . Fibromyalgia Sister   . Hypertension Sister   . Heart Problems Sister   . Hypertension Sister   . Prostate cancer Neg Hx   . Kidney cancer Neg Hx   . Bladder Cancer Neg Hx     Social History: I have reviewed social history from my progress note on 05/02/2018. Social History   Socioeconomic History  . Marital status: Married    Spouse name: richard  . Number of children: 3  . Years of education: Not on file  . Highest education level: Some college, no degree   Occupational History    Comment: retired  Scientific laboratory technician  . Financial resource strain: Not hard at all  . Food insecurity:    Worry: Never true    Inability: Never true  . Transportation needs:    Medical: No    Non-medical: No  Tobacco Use  . Smoking status: Never Smoker  . Smokeless tobacco: Never Used  Substance and Sexual Activity  . Alcohol use: No    Alcohol/week: 0.0 - 2.0 standard drinks    Comment: 2-3 times weekly  . Drug use: No  . Sexual activity: Yes    Birth control/protection: Post-menopausal, None  Lifestyle  . Physical activity:    Days per week: 4 days    Minutes per session: 120 min  . Stress: To some extent  Relationships  . Social connections:    Talks on phone: More than three times a week    Gets together: More than three times a week    Attends religious service: More than 4 times per year    Active member of club or organization: Yes    Attends meetings of clubs or organizations: More than 4 times per year    Relationship status: Married  Other Topics Concern  . Not on file  Social History Narrative   Patient lives at home with her husband of 15 years. They have 3 adult children (2 boys, 1 girl). They recently moved back to [place] in January. 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    Allergies:  Allergies  Allergen Reactions  . Penicillins Swelling    As a child    Metabolic Disorder Labs: Lab Results  Component Value Date   HGBA1C 4.9 01/20/2018   MPG 94 01/20/2018   Lab Results  Component Value Date   PROLACTIN 3.2 (L) 01/20/2018   Lab Results  Component Value Date   CHOL 221 (H) 01/20/2018   TRIG 131 01/20/2018   HDL 53 01/20/2018   CHOLHDL 4.2 01/20/2018   VLDL 26 01/20/2018   LDLCALC 142 (H) 01/20/2018   LDLCALC 157 (H) 12/23/2016   Lab Results  Component Value Date   TSH 0.82 10/02/2018   TSH 0.68 12/23/2016    Therapeutic Level Labs: No  results found for: LITHIUM No results found for: VALPROATE No components found for:  CBMZ  Current Medications: Current Outpatient Medications  Medication Sig Dispense Refill  . ARIPiprazole (ABILIFY) 5 MG tablet Take 0.5 tablets (2.5 mg total) by mouth daily. 15 tablet 3  . buPROPion (WELLBUTRIN XL) 300 MG 24 hr tablet Take 1 tablet (300 mg total) by mouth daily. 30 tablet 3  . doxycycline (VIBRA-TABS) 100 MG  tablet Take 1 tablet (100 mg total) by mouth 2 (two) times daily. 20 tablet 0  . hydrOXYzine (ATARAX/VISTARIL) 10 MG tablet Take 1-2 tablets (10-20 mg total) by mouth daily as needed for anxiety. Only for severe anxiety sx. 45 tablet 3  . ibuprofen (ADVIL,MOTRIN) 800 MG tablet     . losartan (COZAAR) 50 MG tablet Take 1 tablet (50 mg total) by mouth daily. NEED APPT FOR 90 DAY SUPPLY. OVERDUE SINCE January. 90 tablet 1  . metoprolol succinate (TOPROL-XL) 25 MG 24 hr tablet Take 1 tablet (25 mg total) by mouth daily. 90 tablet 1  . traZODone (DESYREL) 50 MG tablet Take 0.5-1 tablets (25-50 mg total) by mouth at bedtime as needed for sleep. 30 tablet 3   No current facility-administered medications for this visit.      Musculoskeletal: Strength & Muscle Tone: UTA Gait & Station: normal Patient leans: N/A  Psychiatric Specialty Exam: Review of Systems  Psychiatric/Behavioral: The patient is nervous/anxious and has insomnia.   All other systems reviewed and are negative.   There were no vitals taken for this visit.There is no height or weight on file to calculate BMI.  General Appearance: Casual  Eye Contact:  Fair  Speech:  Clear and Coherent  Volume:  Normal  Mood:  Anxious  Affect:  Appropriate  Thought Process:  Goal Directed and Descriptions of Associations: Intact  Orientation:  Full (Time, Place, and Person)  Thought Content: Logical   Suicidal Thoughts:  No  Homicidal Thoughts:  No  Memory:  Immediate;   Fair Recent;   Fair Remote;   Fair  Judgement:  Fair   Insight:  Fair  Psychomotor Activity:  Normal  Concentration:  Concentration: Fair and Attention Span: Fair  Recall:  [employer] of Knowledge: Fair  Language: Fair  Akathisia:  No  Handed:  Right  AIMS (if indicated):Denies tremors, rigidity,stiffness  Assets:  Communication Skills Desire for Improvement Housing Social Support  ADL's:  Intact  Cognition: WNL  Sleep:  Poor   Screenings: PHQ2-9     Office Visit from 10/02/2018 in [facility] Office Visit from 12/23/2016 in [facility]  PHQ-2 Total Score  1  1  PHQ-9 Total Score  9  -       Assessment and Plan: Julia Cooke is a 68 year old Caucasian female who has a history of bipolar disorder, was evaluated by telemedicine today.  Patient is currently struggling with some anxiety and sleep problems.  She however has good social support system.  She will benefit from medication changes as noted below.  Plan Bipolar disorder type I- stable Abilify 2.5 mg p.o. daily. Wellbutrin XL 300 mg p.o. daily Hydroxyzine 10 to 20 mg p.o. daily PRN for severe anxiety symptoms.  For insomnia-unstable Start trazodone 25 to 50 mg p.o. nightly as needed. Discussed sleep hygiene techniques.  Follow-up in clinic in 2 months or sooner if needed.  I have spent atleast 15 minutes non face to face with patient today. More than 50 % of the time was spent for psychoeducation and supportive psychotherapy and care coordination.   This note was generated in part or whole with voice recognition software. Voice recognition is usually quite accurate but there are transcription errors that can and very often do occur. I apologize for any typographical errors that were not detected and corrected.         Ursula Alert, MD 03/27/2019, 8:57 AM

## 2019-03-26 NOTE — Progress Notes (Signed)
Patient ID: Julia Cooke, female   DOB: 11-Feb-1951, 68 y.o.   MRN: [id number]  Virtual Visit via video Note  This visit type was conducted due to national recommendations for restrictions regarding the COVID-19 pandemic (e.g. social distancing).  This format is felt to be most appropriate for this patient at this time.  All issues noted in this document were discussed and addressed.  No physical exam was performed (except for noted visual exam findings with Video Visits).   I connected with Sueanne Margarita on 03/26/19 at 11:00 AM EDT by a video enabled telemedicine application or telephone and verified that I am speaking with the correct person using two identifiers. Location patient: home Location provider: [facility] Persons participating in the virtual visit: patient, provider  I discussed the limitations, risks, security and privacy concerns of performing an evaluation and management service by video and the availability of in person appointments. I also discussed with the patient that there may be a patient responsible charge related to this service. The patient expressed understanding and agreed to proceed.   HPI:  Patient and I connected via video due to finding a tick on her back.  Patient believes the tick has been on her for approximately 3 to 4 days.  Daughter was able to remove the tick fully.  There is a small red area with a little bit of white pus in the middle.  Patient has never been bit by a tick before as far as she can remember.  Denies fever or chills.  Denies body aches.  Denies joint aches.  Denies respiratory symptoms.  Denies chest pain.  Denies GI or GU issues.   ROS: See pertinent positives and negatives per HPI.  Past Medical History:  Diagnosis Date  . Bipolar disorder ([place])    takes Abilify  . Depression   . Dysrhythmia 10/2016   LBBB on ekg...nothing to compare it to.  Marland Kitchen Heart murmur   . Hypertension   . Nephrolithiasis 2000   s/p lithotripsy  .  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 EXTRACORPOREAL SHOCK WAVE LITHOTRIPSY Left 01/06/2017   Procedure: EXTRACORPOREAL SHOCK WAVE LITHOTRIPSY (ESWL);  Surgeon: Hollice Espy, MD;  Location: [facility] ORS;  Service: Urology;  Laterality: Left;  . LITHOTRIPSY  2000    Family History  Problem Relation Age of Onset  . Hyperlipidemia Mother   . Stroke Mother   . Hypertension Mother   . Hyperlipidemia Father   . Hypertension Father   . Cancer Sister        breast  . Breast cancer Sister 15  . Heart disease Brother        myocardial infarction  . Heart attack Brother   . Hypertension Brother   . Fibromyalgia Sister   . Hypertension Sister   . Heart Problems Sister   . Hypertension Sister   . Prostate cancer Neg Hx   . Kidney cancer Neg Hx   . Bladder Cancer Neg Hx     Social History   Tobacco Use  . Smoking status: Never Smoker  . Smokeless tobacco: Never Used  Substance Use Topics  . Alcohol use: No    Alcohol/week: 0.0 - 2.0 standard drinks    Comment: 2-3 times weekly     Current Outpatient Medications:  .  ARIPiprazole (ABILIFY) 5 MG tablet, Take 0.5 tablets (2.5 mg total) by mouth daily., Disp: 15 tablet, Rfl: 3 .  buPROPion (WELLBUTRIN XL) 300 MG 24 hr tablet, Take 1 tablet (300 mg total) by mouth daily., Disp: 30 tablet, Rfl: 3 .  hydrOXYzine (ATARAX/VISTARIL) 10 MG tablet, Take 1-2 tablets (10-20 mg total) by mouth daily as needed for anxiety. Only for severe anxiety sx., Disp: 45 tablet, Rfl: 3 .  ibuprofen (ADVIL,MOTRIN) 800 MG tablet, , Disp: , Rfl:  .  losartan (COZAAR) 50 MG tablet, Take 1 tablet (50 mg total) by mouth daily. NEED APPT FOR 90 DAY SUPPLY. OVERDUE SINCE January., Disp: 90 tablet, Rfl: 1 .  metoprolol succinate (TOPROL-XL) 25 MG 24 hr tablet, Take 1 tablet (25 mg total) by mouth daily., Disp: 90 tablet, Rfl: 1 .  traZODone (DESYREL) 50 MG tablet, Take 0.5-1 tablets (25-50 mg total) by mouth at  bedtime as needed for sleep., Disp: 30 tablet, Rfl: 3  EXAM:  GENERAL: alert, oriented, appears well and in no acute distress  HEENT: atraumatic, conjunttiva clear, no obvious abnormalities on inspection of external nose and ears  NECK: normal movements of the head and neck  LUNGS: on inspection no signs of respiratory distress, breathing rate appears normal, no obvious gross SOB, gasping or wheezing  CV: no obvious cyanosis  MS: moves all visible extremities without noticeable abnormality  PSYCH/NEURO: pleasant and cooperative, no obvious depression or anxiety, speech and thought processing grossly intact  ASSESSMENT AND PLAN:  Discussed the following assessment and plan:  Tick bite of back, initial encounter  Patient unable to show me her back on the video due to do a video chat alone.  Per her description of how the area looks and suspecting that the tick was in her skin for at least 3 days, we will treat proactively with doxycycline to cover [facility] spotted fever and Lyme disease.  Advised patient that there is not much Lyme disease seen in our geographical area, we more associate [facility] spotted fever.  Advised patient that the doxycycline course will cover both Lyme disease and [facility] spotted fever.  Also advised that it is currently too early to test for [facility] spotted fever and Lyme disease due to them being antibody tests, we will schedule her out for 2 weeks to come in for lab testing to look for these antibodies.   I discussed the assessment and treatment plan with the patient. The patient was provided an opportunity to ask questions and all were answered. The patient agreed with the plan and demonstrated an understanding of the instructions.   The patient was advised to call back or seek an in-person evaluation if the symptoms worsen or if the condition fails to improve as anticipated.   Jodelle Green, FNP

## 2019-03-27 ENCOUNTER — Encounter: Payer: Self-pay | Admitting: Psychiatry

## 2019-04-17 ENCOUNTER — Other Ambulatory Visit: Payer: Self-pay | Admitting: Internal Medicine

## 2019-04-17 DIAGNOSIS — W57XXXA Bitten or stung by nonvenomous insect and other nonvenomous arthropods, initial encounter: Secondary | ICD-10-CM

## 2019-04-17 DIAGNOSIS — S30860A Insect bite (nonvenomous) of lower back and pelvis, initial encounter: Secondary | ICD-10-CM

## 2019-04-17 MED ORDER — DOXYCYCLINE HYCLATE 100 MG PO TABS: 100.0000 mg | ORAL_TABLET | Freq: Two times a day (BID) | ORAL | 0 refills | Status: DC

## 2019-05-17 ENCOUNTER — Ambulatory Visit: Payer: Self-pay | Admitting: Urology

## 2019-05-24 ENCOUNTER — Telehealth: Payer: Self-pay | Admitting: Internal Medicine

## 2019-05-24 DIAGNOSIS — Z1211 Encounter for screening for malignant neoplasm of colon: Secondary | ICD-10-CM

## 2019-05-24 NOTE — Telephone Encounter (Signed)
Is it okay to order this

## 2019-05-24 NOTE — Telephone Encounter (Signed)
Ordered and printed 

## 2019-05-24 NOTE — Telephone Encounter (Signed)
Pt need order for cologuard test. Please advise

## 2019-05-25 NOTE — Telephone Encounter (Signed)
Cologuard order has been faxed.

## 2019-05-28 ENCOUNTER — Other Ambulatory Visit: Payer: Self-pay

## 2019-05-28 ENCOUNTER — Encounter: Payer: Self-pay | Admitting: Psychiatry

## 2019-05-28 ENCOUNTER — Ambulatory Visit (INDEPENDENT_AMBULATORY_CARE_PROVIDER_SITE_OTHER): Payer: PPO | Admitting: Psychiatry

## 2019-05-28 DIAGNOSIS — G4701 Insomnia due to medical condition: Secondary | ICD-10-CM

## 2019-05-28 DIAGNOSIS — F3131 Bipolar disorder, current episode depressed, mild: Secondary | ICD-10-CM

## 2019-05-28 MED ORDER — BUPROPION HCL ER (XL) 300 MG PO TB24: 300.0000 mg | ORAL_TABLET | Freq: Every day | ORAL | 3 refills | Status: DC

## 2019-05-28 MED ORDER — ARIPIPRAZOLE 5 MG PO TABS: 2.5000 mg | ORAL_TABLET | Freq: Every day | ORAL | 3 refills | Status: DC

## 2019-05-28 MED ORDER — HYDROXYZINE HCL 10 MG PO TABS: 10.0000 mg | ORAL_TABLET | Freq: Every day | ORAL | 3 refills | Status: DC | PRN

## 2019-05-28 NOTE — Progress Notes (Signed)
Virtual Visit via Video Note  I connected with Julia Cooke on 05/28/19 at 10:00 AM EDT by a video enabled telemedicine application and verified that I am speaking with the correct person using two identifiers.   I discussed the limitations of evaluation and management by telemedicine and the availability of in person appointments. The patient expressed understanding and agreed to proceed.   I discussed the assessment and treatment plan with the patient. The patient was provided an opportunity to ask questions and all were answered. The patient agreed with the plan and demonstrated an understanding of the instructions.   The patient was advised to call back or seek an in-person evaluation if the symptoms worsen or if the condition fails to improve as anticipated.   [place] MD OP Progress Note  05/28/2019 1:50 PM Julia Cooke  MRN:  [id number]  Chief Complaint:  Chief Complaint    Follow-up     HPI: Julia Cooke is a 68 year old Caucasian female, married, on SSI, lives in [place], has a history of bipolar disorder, insomnia was evaluated by telemedicine today.  A video call was initiated however due to connection problem it had to be changed to a phone call.  Patient today reports she continues to live with her daughter.  She reports she is currently going through the stressors of the current situational problems of COVID-19.  She however has been coping with it.  She continues to have good social support system.  She reports sleep is better on the trazodone.  She continues to be compliant with the Abilify and Wellbutrin.  She denies any side effects.  She reports she does struggle with some mood symptoms on and off however would like to stay on her medications at this time since she is able to cope with it.  Patient denies any other concerns today.   Visit Diagnosis:    ICD-10-CM   1. Bipolar disorder, current episode depressed, mild (HCC)  F31.31 ARIPiprazole (ABILIFY) 5 MG tablet   buPROPion (WELLBUTRIN XL) 300 MG 24 hr tablet   improving  2. Insomnia due to medical condition  G47.01 hydrOXYzine (ATARAX/VISTARIL) 10 MG tablet    Past Psychiatric History: Reviewed past psychiatric history from my progress note on 05/02/2018.  Past Medical History:  Past Medical History:  Diagnosis Date  . Bipolar disorder ([place])    takes Abilify  . Depression   . Dysrhythmia 10/2016   LBBB on ekg...nothing to compare it to.  Marland Kitchen Heart murmur   . Hypertension   . Nephrolithiasis 2000   s/p lithotripsy  . 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 EXTRACORPOREAL SHOCK WAVE LITHOTRIPSY Left 01/06/2017   Procedure: EXTRACORPOREAL SHOCK WAVE LITHOTRIPSY (ESWL);  Surgeon: Hollice Espy, MD;  Location: [facility] ORS;  Service: Urology;  Laterality: Left;  . LITHOTRIPSY  2000    Family Psychiatric History: Reviewed family psychiatric history from my progress note on 05/02/2018.  Family History:  Family History  Problem Relation Age of Onset  . Hyperlipidemia Mother   . Stroke Mother   . Hypertension Mother   . Hyperlipidemia Father   . Hypertension Father   . Cancer Sister        breast  . Breast cancer Sister 88  . Heart disease Brother        myocardial infarction  . Heart attack Brother   . Hypertension Brother   . Fibromyalgia Sister   . Hypertension Sister   .  Heart Problems Sister   . Hypertension Sister   . Prostate cancer Neg Hx   . Kidney cancer Neg Hx   . Bladder Cancer Neg Hx     Social History: Reviewed social history from my progress note on 05/02/2018. Social History   Socioeconomic History  . Marital status: Married    Spouse name: richard  . Number of children: 3  . Years of education: Not on file  . Highest education level: Some college, no degree  Occupational History    Comment: retired  Scientific laboratory technician  . Financial resource strain: Not hard at all  . Food insecurity    Worry: Never true    Inability:  Never true  . Transportation needs    Medical: No    Non-medical: No  Tobacco Use  . Smoking status: Never Smoker  . Smokeless tobacco: Never Used  Substance and Sexual Activity  . Alcohol use: No    Alcohol/week: 0.0 - 2.0 standard drinks    Comment: 2-3 times weekly  . Drug use: No  . Sexual activity: Yes    Birth control/protection: Post-menopausal, None  Lifestyle  . Physical activity    Days per week: 4 days    Minutes per session: 120 min  . Stress: To some extent  Relationships  . Social connections    Talks on phone: More than three times a week    Gets together: More than three times a week    Attends religious service: More than 4 times per year    Active member of club or organization: Yes    Attends meetings of clubs or organizations: More than 4 times per year    Relationship status: Married  Other Topics Concern  . Not on file  Social History Narrative   Patient lives at home with her husband of 66 years. They have 3 adult children (2 boys, 1 girl). They recently moved back to [place] in January. 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    Allergies:  Allergies  Allergen Reactions  . Penicillins Swelling    As a child    Metabolic Disorder Labs: Lab Results  Component Value Date   HGBA1C 4.9 01/20/2018   MPG 94 01/20/2018   Lab Results  Component Value Date   PROLACTIN 3.2 (L) 01/20/2018   Lab Results  Component Value Date   CHOL 221 (H) 01/20/2018   TRIG 131 01/20/2018   HDL 53 01/20/2018   CHOLHDL 4.2 01/20/2018   VLDL 26 01/20/2018   LDLCALC 142 (H) 01/20/2018   LDLCALC 157 (H) 12/23/2016   Lab Results  Component Value Date   TSH 0.82 10/02/2018   TSH 0.68 12/23/2016    Therapeutic Level Labs: No results found for: LITHIUM No results found for: VALPROATE No components found for:  CBMZ  Current Medications: Current Outpatient Medications  Medication Sig  Dispense Refill  . ARIPiprazole (ABILIFY) 5 MG tablet Take 0.5 tablets (2.5 mg total) by mouth daily. 15 tablet 3  . buPROPion (WELLBUTRIN XL) 300 MG 24 hr tablet Take 1 tablet (300 mg total) by mouth daily. 30 tablet 3  . doxycycline (VIBRA-TABS) 100 MG tablet Take 1 tablet (100 mg total) by mouth 2 (two) times daily. 20 tablet 0  . hydrOXYzine (ATARAX/VISTARIL) 10 MG tablet Take 1-2 tablets (10-20 mg total) by mouth daily as needed for anxiety. Only for severe anxiety sx. 45 tablet  3  . ibuprofen (ADVIL,MOTRIN) 800 MG tablet     . losartan (COZAAR) 50 MG tablet Take 1 tablet (50 mg total) by mouth daily. NEED APPT FOR 90 DAY SUPPLY. OVERDUE SINCE January. 90 tablet 1  . metoprolol succinate (TOPROL-XL) 25 MG 24 hr tablet Take 1 tablet (25 mg total) by mouth daily. 90 tablet 1  . traZODone (DESYREL) 50 MG tablet Take 0.5-1 tablets (25-50 mg total) by mouth at bedtime as needed for sleep. 30 tablet 3   No current facility-administered medications for this visit.      Musculoskeletal: Strength & Muscle Tone: Reports wnl Gait & Station: Reports WNL Patient leans: N/A  Psychiatric Specialty Exam: Review of Systems  All other systems reviewed and are negative.   There were no vitals taken for this visit.There is no height or weight on file to calculate BMI.  General Appearance: UTA  Eye Contact:  UTA  Speech:  Clear and Coherent  Volume:  Normal  Mood:  Anxious  Affect:  UTA  Thought Process:  Goal Directed and Descriptions of Associations: Intact  Orientation:  Full (Time, Place, and Person)  Thought Content: Logical   Suicidal Thoughts:  No  Homicidal Thoughts:  No  Memory:  Immediate;   Fair Recent;   Fair Remote;   Fair  Judgement:  Fair  Insight:  Fair  Psychomotor Activity:  UTA  Concentration:  Concentration: Fair and Attention Span: Fair  Recall:  [employer] of Knowledge: Fair  Language: Fair  Akathisia:  No  Handed:  Right  AIMS (if indicated): Denies tremors,  rigidity  Assets:  Communication Skills Desire for Improvement Housing Social Support  ADL's:  Intact  Cognition: WNL  Sleep:  Fair   Screenings: PHQ2-9     Office Visit from 10/02/2018 in [facility] Office Visit from 12/23/2016 in [facility]  PHQ-2 Total Score  1  1  PHQ-9 Total Score  9  -       Assessment and Plan: Julia Cooke is a 68 year old Caucasian female who has a history of bipolar disorder was evaluated by telemedicine today.  Patient with psychosocial stressors of current COVID-19 however is making progress on the current medication regimen.  Patient did struggle with sleep however is making progress on the trazodone at this time.  Plan as noted below.  Plan Bipolar disorder type I-stable Abilify 2.5 mg p.o. daily Wellbutrin XL 300 mg p.o. daily Hydroxyzine 10 to 20 mg p.o. daily PRN for severe anxiety symptoms.  Insomnia- improving Trazodone 25 to 50 mg p.o. nightly as needed  Follow-up in clinic in 2 to 3 months or sooner if needed.  September 29 at 4:30 PM  I have spent atleast 15 minutes non face to face with patient today. More than 50 % of the time was spent for psychoeducation and supportive psychotherapy and care coordination.  This note was generated in part or whole with voice recognition software. Voice recognition is usually quite accurate but there are transcription errors that can and very often do occur. I apologize for any typographical errors that were not detected and corrected.        Ursula Alert, MD 05/28/2019, 1:50 PM

## 2019-06-05 ENCOUNTER — Other Ambulatory Visit: Payer: Self-pay | Admitting: Internal Medicine

## 2019-06-11 DIAGNOSIS — Z1211 Encounter for screening for malignant neoplasm of colon: Secondary | ICD-10-CM | POA: Diagnosis not present

## 2019-06-16 LAB — COLOGUARD: Cologuard: NEGATIVE

## 2019-06-26 NOTE — Progress Notes (Signed)
06/27/2019 1:49 PM   [name] [birth year] [id number]  Referring provider: Crecencio Mc, MD [address] [place],  [place] [address]  Chief Complaint  Patient presents with  . Nephrolithiasis    HPI: 68 yo female with a history of nephrolithiasis and a history of hematuria associated with nephrolithiasis who presents for a one year follow up.    Contrast CT performed on 11/16/2016 noted an 8 mm left UVJ stone with mild left hydronephrosis. Correlation with urinalysis recommended to exclude superimposed UTI. Multiple smaller and nonobstructing left renal calculi as well as tiny right renal stone noted. No hydronephrosis on the right. Sigmoid diverticulosis. No evidence of bowel obstruction or active inflammation. Normal appendix.  Second stone event.  Last event 14 years ago treated with laser lithotripsy.    ESWL on 01/06/2017 for a left 8 mm UVJ stone.  KUB taken on 01/24/2017 (today) notes resolved left ureterovesical junction calculus since 01/06/2017.  Small bilateral nephrolithiasis.      Stone composition was calcium phosphate.    RUS performed on 02/24/2017 noted no hydronephrosis. 6 mm nonobstructing midpole stone in the right kidney.  Her UA last April was negative for [facility]    KUB taken on 03/07/2018 was negative for stones.    KUB on 06/27/2019 notes 2 right and 1 left small punctate stones.   Today, she is having frequency (6 to 7 times a day), occasional nocturia and intermittency.  These are baseline and not bothersome to her.  Patient denies any gross hematuria, dysuria or suprapubic/flank pain.  Patient denies any fevers, chills, nausea or vomiting.   She is also having painful intercourse.  Her pain occurs during penetration, but it does not continue with intercourse.  She is not bothered by this as well as she is not sexually active at this time.     PMH: Past Medical History:  Diagnosis Date  . Bipolar disorder ([place])    takes Abilify   . Depression   . Dysrhythmia 10/2016   LBBB on ekg...nothing to compare it to.  Marland Kitchen Heart murmur   . Hypertension   . Nephrolithiasis 2000   s/p lithotripsy  . 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 EXTRACORPOREAL SHOCK WAVE LITHOTRIPSY Left 01/06/2017   Procedure: EXTRACORPOREAL SHOCK WAVE LITHOTRIPSY (ESWL);  Surgeon: Hollice Espy, MD;  Location: [facility] ORS;  Service: Urology;  Laterality: Left;  . LITHOTRIPSY  2000    Home Medications:  Allergies as of 06/27/2019      Reactions   Penicillins Swelling   As a child      Medication List       Accurate as of June 27, 2019  1:49 PM. If you have any questions, ask your nurse or doctor.        STOP taking these medications   doxycycline 100 MG tablet Commonly known as: VIBRA-TABS Stopped by: [name], PA-C     TAKE these medications   ARIPiprazole 5 MG tablet Commonly known as: Abilify Take 0.5 tablets (2.5 mg total) by mouth daily.   buPROPion 300 MG 24 hr tablet Commonly known as: Wellbutrin XL Take 1 tablet (300 mg total) by mouth daily.   hydrOXYzine 10 MG tablet Commonly known as: ATARAX/VISTARIL Take 1-2 tablets (10-20 mg total) by mouth daily as needed for anxiety. Only for severe anxiety sx.   ibuprofen 800 MG tablet Commonly known as: ADVIL  losartan 50 MG tablet Commonly known as: COZAAR TAKE ONE TABLET BY MOUTH DAILY   metoprolol succinate 25 MG 24 hr tablet Commonly known as: TOPROL-XL Take 1 tablet (25 mg total) by mouth daily.   traZODone 50 MG tablet Commonly known as: DESYREL Take 0.5-1 tablets (25-50 mg total) by mouth at bedtime as needed for sleep.       Allergies:  Allergies  Allergen Reactions  . Penicillins Swelling    As a child    Family History: Family History  Problem Relation Age of Onset  . Hyperlipidemia Mother   . Stroke Mother   . Hypertension Mother   . Hyperlipidemia Father   .  Hypertension Father   . Cancer Sister        breast  . Breast cancer Sister 72  . Heart disease Brother        myocardial infarction  . Heart attack Brother   . Hypertension Brother   . Fibromyalgia Sister   . Hypertension Sister   . Heart Problems Sister   . Hypertension Sister   . Prostate cancer Neg Hx   . Kidney cancer Neg Hx   . Bladder Cancer Neg Hx     Social History:  reports that she has never smoked. She has never used smokeless tobacco. She reports that she does not drink alcohol or use drugs.  ROS: UROLOGY Frequent Urination?: Yes Hard to postpone urination?: No Burning/pain with urination?: No Get up at night to urinate?: Yes Leakage of urine?: No Urine stream starts and stops?: No Trouble starting stream?: Yes Do you have to strain to urinate?: No Blood in urine?: No Urinary tract infection?: No Sexually transmitted disease?: No Injury to kidneys or bladder?: No Painful intercourse?: Yes Weak stream?: No Currently pregnant?: No Vaginal bleeding?: No Last menstrual period?: n  Gastrointestinal Nausea?: No Vomiting?: No Indigestion/heartburn?: No Diarrhea?: No Constipation?: No  Constitutional Fever: No Night sweats?: No Weight loss?: No Fatigue?: Yes  Skin Skin rash/lesions?: No Itching?: No  Eyes Blurred vision?: No Double vision?: No  Ears/Nose/Throat Sore throat?: No Sinus problems?: Yes  Hematologic/Lymphatic Swollen glands?: No Easy bruising?: Yes  Cardiovascular Leg swelling?: No Chest pain?: No  Respiratory Cough?: No Shortness of breath?: Yes  Endocrine Excessive thirst?: No  Musculoskeletal Back pain?: Yes Joint pain?: No  Neurological Headaches?: No Dizziness?: No  Psychologic Depression?: No Anxiety?: Yes  Physical Exam: BP 134/89 (BP Location: Left Arm, Patient Position: Sitting, Cuff Size: Normal)   Pulse 82   Ht 5\' 1"  (1.549 m)   Wt 187 lb (84.8 kg)   BMI 35.33 kg/m   Constitutional:  Well  nourished. Alert and oriented, No acute distress. HEENT: [place] AT, moist mucus membranes.  Trachea midline, no masses. Cardiovascular: No clubbing, cyanosis, or edema. Respiratory: Normal respiratory effort, no increased work of breathing. Neurologic: Grossly intact, no focal deficits, moving all 4 extremities. Psychiatric: Normal mood and affect.   Laboratory Data: Lab Results  Component Value Date   WBC 7.2 01/20/2018   HGB 15.3 01/20/2018   HCT 44.6 01/20/2018   MCV 89.5 01/20/2018   PLT 232 01/20/2018    Lab Results  Component Value Date   CREATININE 0.92 10/02/2018    Lab Results  Component Value Date   HGBA1C 4.9 01/20/2018     Pertinent Imaging: CLINICAL DATA:  History of renal stones and left sided lithotripsy.  EXAM: ABDOMEN - 1 VIEW  COMPARISON:  Abdominal radiograph 01/24/2017  FINDINGS: There is a 2 mm  calcification within the right mid abdomen projecting over the expected location of the kidney. There is a 2 mm calcification within the right lower quadrant projecting over the inferior aspect of the right kidney. There is a 2 mm calcification within the left mid abdomen projecting over the expected location of the left kidney. Multiple pelvic phleboliths. Relative paucity of small bowel gas. Lung bases are clear. Lumbar spine degenerative changes.  IMPRESSION: There are 2 right and 1 left small punctate calcifications which may represent nephrolithiasis.   Electronically Signed   By: Lovey Newcomer M.D.   On: 06/28/2019 09:35 I have independently reviewed the films and note bilateral stones.    Assessment & Plan:   1. Bilateral nephrolithiasis No intervention needed at this time KUB yearly Advised to contact our office or seek treatment in the ED if becomes febrile or pain/ vomiting are difficult control in order to arrange for emergent/urgent intervention  2. OAB Discussed behavioral therapies, bladder training, bladder control strategies  and pelvic floor muscle training Discussed drinking more water as she does not drink a lot of fluids in general   She is not interested any therapies at this time  3. Dyspareunia She is not sexually active at this time, so she is not wanting any further intervention  Return in about 1 year (around 06/26/2020) for KUB and office visit .  [name], PA-C  [facility] [address] [place], [place] [address] [phone]

## 2019-06-27 ENCOUNTER — Ambulatory Visit
Admission: RE | Admit: 2019-06-27 | Discharge: 2019-06-27 | Disposition: A | Discharge status: Home / Self Care | Payer: PPO | Attending: Urology | Admitting: Urology

## 2019-06-27 ENCOUNTER — Ambulatory Visit (INDEPENDENT_AMBULATORY_CARE_PROVIDER_SITE_OTHER): Payer: PPO | Admitting: Urology

## 2019-06-27 ENCOUNTER — Encounter: Payer: Self-pay | Admitting: Urology

## 2019-06-27 ENCOUNTER — Other Ambulatory Visit: Payer: Self-pay

## 2019-06-27 ENCOUNTER — Ambulatory Visit
Admission: RE | Admit: 2019-06-27 | Discharge: 2019-06-27 | Disposition: A | Discharge status: Home / Self Care | Payer: PPO | Source: Ambulatory Visit | Attending: Urology | Admitting: Urology

## 2019-06-27 VITALS — BP 134/89 | HR 82 | Ht 61.0 in | Wt 187.0 lb

## 2019-06-27 DIAGNOSIS — Z87442 Personal history of urinary calculi: Secondary | ICD-10-CM

## 2019-06-27 DIAGNOSIS — N200 Calculus of kidney: Secondary | ICD-10-CM | POA: Diagnosis not present

## 2019-06-27 DIAGNOSIS — N941 Unspecified dyspareunia: Secondary | ICD-10-CM | POA: Diagnosis not present

## 2019-06-27 DIAGNOSIS — N3281 Overactive bladder: Secondary | ICD-10-CM | POA: Diagnosis not present

## 2019-07-04 ENCOUNTER — Ambulatory Visit: Payer: PPO | Admitting: Licensed Clinical Social Worker

## 2019-07-14 ENCOUNTER — Other Ambulatory Visit: Payer: Self-pay | Admitting: Internal Medicine

## 2019-07-16 DIAGNOSIS — Z85828 Personal history of other malignant neoplasm of skin: Secondary | ICD-10-CM | POA: Diagnosis not present

## 2019-07-16 DIAGNOSIS — D485 Neoplasm of uncertain behavior of skin: Secondary | ICD-10-CM | POA: Diagnosis not present

## 2019-07-16 DIAGNOSIS — D2262 Melanocytic nevi of left upper limb, including shoulder: Secondary | ICD-10-CM | POA: Diagnosis not present

## 2019-07-16 DIAGNOSIS — D225 Melanocytic nevi of trunk: Secondary | ICD-10-CM | POA: Diagnosis not present

## 2019-07-16 DIAGNOSIS — C44712 Basal cell carcinoma of skin of right lower limb, including hip: Secondary | ICD-10-CM | POA: Diagnosis not present

## 2019-07-16 DIAGNOSIS — L923 Foreign body granuloma of the skin and subcutaneous tissue: Secondary | ICD-10-CM | POA: Diagnosis not present

## 2019-07-16 DIAGNOSIS — L237 Allergic contact dermatitis due to plants, except food: Secondary | ICD-10-CM | POA: Diagnosis not present

## 2019-07-16 DIAGNOSIS — L821 Other seborrheic keratosis: Secondary | ICD-10-CM | POA: Diagnosis not present

## 2019-07-16 DIAGNOSIS — D2261 Melanocytic nevi of right upper limb, including shoulder: Secondary | ICD-10-CM | POA: Diagnosis not present

## 2019-07-18 ENCOUNTER — Other Ambulatory Visit: Payer: Self-pay

## 2019-07-18 ENCOUNTER — Ambulatory Visit: Payer: PPO | Admitting: Licensed Clinical Social Worker

## 2019-07-30 ENCOUNTER — Other Ambulatory Visit: Payer: Self-pay | Admitting: Psychiatry

## 2019-07-30 DIAGNOSIS — F5105 Insomnia due to other mental disorder: Secondary | ICD-10-CM

## 2019-08-16 DIAGNOSIS — C44712 Basal cell carcinoma of skin of right lower limb, including hip: Secondary | ICD-10-CM | POA: Diagnosis not present

## 2019-08-17 ENCOUNTER — Other Ambulatory Visit: Payer: Self-pay

## 2019-08-17 ENCOUNTER — Ambulatory Visit (INDEPENDENT_AMBULATORY_CARE_PROVIDER_SITE_OTHER): Payer: PPO

## 2019-08-17 ENCOUNTER — Ambulatory Visit: Payer: PPO | Admitting: Licensed Clinical Social Worker

## 2019-08-17 DIAGNOSIS — Z23 Encounter for immunization: Secondary | ICD-10-CM | POA: Diagnosis not present

## 2019-08-23 ENCOUNTER — Ambulatory Visit: Payer: PPO | Admitting: Licensed Clinical Social Worker

## 2019-08-28 ENCOUNTER — Ambulatory Visit (INDEPENDENT_AMBULATORY_CARE_PROVIDER_SITE_OTHER): Payer: PPO | Admitting: Psychiatry

## 2019-08-28 ENCOUNTER — Other Ambulatory Visit: Payer: Self-pay

## 2019-08-28 ENCOUNTER — Encounter: Payer: Self-pay | Admitting: Psychiatry

## 2019-08-28 DIAGNOSIS — F3175 Bipolar disorder, in partial remission, most recent episode depressed: Secondary | ICD-10-CM

## 2019-08-28 DIAGNOSIS — Z79899 Other long term (current) drug therapy: Secondary | ICD-10-CM

## 2019-08-28 DIAGNOSIS — G4701 Insomnia due to medical condition: Secondary | ICD-10-CM

## 2019-08-28 DIAGNOSIS — F3131 Bipolar disorder, current episode depressed, mild: Secondary | ICD-10-CM | POA: Insufficient documentation

## 2019-08-28 MED ORDER — HYDROXYZINE HCL 10 MG PO TABS: 10.0000 mg | ORAL_TABLET | Freq: Every day | ORAL | 3 refills | Status: DC | PRN

## 2019-08-28 MED ORDER — ARIPIPRAZOLE 5 MG PO TABS: 2.5000 mg | ORAL_TABLET | Freq: Every day | ORAL | 3 refills | Status: DC

## 2019-08-28 MED ORDER — BUPROPION HCL ER (XL) 300 MG PO TB24: 300.0000 mg | ORAL_TABLET | Freq: Every day | ORAL | 3 refills | Status: DC

## 2019-08-28 NOTE — Progress Notes (Signed)
Virtual Visit via Video Note  I connected with Julia Cooke on 08/28/19 at  4:30 PM EDT by a video enabled telemedicine application and verified that I am speaking with the correct person using two identifiers.   I discussed the limitations of evaluation and management by telemedicine and the availability of in person appointments. The patient expressed understanding and agreed to proceed   I discussed the assessment and treatment plan with the patient. The patient was provided an opportunity to ask questions and all were answered. The patient agreed with the plan and demonstrated an understanding of the instructions.   The patient was advised to call back or seek an in-person evaluation if the symptoms worsen or if the condition fails to improve as anticipated.   [facility] MD OP Progress Note  08/28/2019 5:13 PM Julia Cooke  MRN:  [id number]  Chief Complaint:  Chief Complaint    Follow-up     HPI: Julia Cooke is a 68 year old Caucasian female, married, on SSI, lives in [place], has a history of bipolar disorder, insomnia was evaluated by telemedicine today.  Patient today reports she is currently coming down with a cold.  She reports she has some sinus congestion.  She reports she otherwise is doing well with regards to her mood symptoms.  She has been compliant on her medications as prescribed.  She continues to take the Abilify at bedtime and is tolerating it well.  She denies any tremors, rigidity or stiffness from the Abilify.  Patient is compliant with her Wellbutrin.  She denies side effects.  Patient denies any suicidality, homicidality or perceptual disturbances.  Patient appeared to be alert, oriented to person place time and situation.   Visit Diagnosis:    ICD-10-CM   1. Bipolar disorder, in partial remission, most recent episode depressed (HCC)  F31.75 buPROPion (WELLBUTRIN XL) 300 MG 24 hr tablet    ARIPiprazole (ABILIFY) 5 MG tablet   improving  2. Insomnia due to  medical condition  G47.01 hydrOXYzine (ATARAX/VISTARIL) 10 MG tablet  3. High risk medication use  Z79.899 Hemoglobin A1C    Prolactin    Past Psychiatric History: Reviewed past psychiatric history from my progress note on 05/02/2018  Past Medical History:  Past Medical History:  Diagnosis Date  . Bipolar disorder ([place])    takes Abilify  . Depression   . Dysrhythmia 10/2016   LBBB on ekg...nothing to compare it to.  Marland Kitchen Heart murmur   . Hypertension   . Nephrolithiasis 2000   s/p lithotripsy  . 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 EXTRACORPOREAL SHOCK WAVE LITHOTRIPSY Left 01/06/2017   Procedure: EXTRACORPOREAL SHOCK WAVE LITHOTRIPSY (ESWL);  Surgeon: Hollice Espy, MD;  Location: [facility] ORS;  Service: Urology;  Laterality: Left;  . LITHOTRIPSY  2000    Family Psychiatric History: Reviewed family psychiatric history from my progress note on 05/02/2018  Family History:  Family History  Problem Relation Age of Onset  . Hyperlipidemia Mother   . Stroke Mother   . Hypertension Mother   . Hyperlipidemia Father   . Hypertension Father   . Cancer Sister        breast  . Breast cancer Sister 32  . Heart disease Brother        myocardial infarction  . Heart attack Brother   . Hypertension Brother   . Fibromyalgia Sister   . Hypertension Sister   . Heart Problems Sister   .  Hypertension Sister   . Prostate cancer Neg Hx   . Kidney cancer Neg Hx   . Bladder Cancer Neg Hx     Social History: Reviewed social history from my progress note on 05/02/2018 Social History   Socioeconomic History  . Marital status: Married    Spouse name: richard  . Number of children: 3  . Years of education: Not on file  . Highest education level: Some college, no degree  Occupational History    Comment: retired  Scientific laboratory technician  . Financial resource strain: Not hard at all  . Food insecurity    Worry: Never true    Inability: Never true  .  Transportation needs    Medical: No    Non-medical: No  Tobacco Use  . Smoking status: Never Smoker  . Smokeless tobacco: Never Used  Substance and Sexual Activity  . Alcohol use: No    Alcohol/week: 0.0 - 2.0 standard drinks    Comment: 2-3 times weekly  . Drug use: No  . Sexual activity: Yes    Birth control/protection: Post-menopausal, None  Lifestyle  . Physical activity    Days per week: 4 days    Minutes per session: 120 min  . Stress: To some extent  Relationships  . Social connections    Talks on phone: More than three times a week    Gets together: More than three times a week    Attends religious service: More than 4 times per year    Active member of club or organization: Yes    Attends meetings of clubs or organizations: More than 4 times per year    Relationship status: Married  Other Topics Concern  . Not on file  Social History Narrative   Patient lives at home with her husband of 34 years. They have 3 adult children (2 boys, 1 girl). They recently moved back to [place] in January. 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    Allergies:  Allergies  Allergen Reactions  . Penicillins Swelling    As a child    Metabolic Disorder Labs: Lab Results  Component Value Date   HGBA1C 4.9 01/20/2018   MPG 94 01/20/2018   Lab Results  Component Value Date   PROLACTIN 3.2 (L) 01/20/2018   Lab Results  Component Value Date   CHOL 221 (H) 01/20/2018   TRIG 131 01/20/2018   HDL 53 01/20/2018   CHOLHDL 4.2 01/20/2018   VLDL 26 01/20/2018   LDLCALC 142 (H) 01/20/2018   LDLCALC 157 (H) 12/23/2016   Lab Results  Component Value Date   TSH 0.82 10/02/2018   TSH 0.68 12/23/2016    Therapeutic Level Labs: No results found for: LITHIUM No results found for: VALPROATE No components found for:  CBMZ  Current Medications: Current Outpatient Medications  Medication Sig Dispense Refill  .  ARIPiprazole (ABILIFY) 5 MG tablet Take 0.5 tablets (2.5 mg total) by mouth daily. 15 tablet 3  . augmented betamethasone dipropionate (DIPROLENE-AF) 0.05 % cream     . buPROPion (WELLBUTRIN XL) 300 MG 24 hr tablet Take 1 tablet (300 mg total) by mouth daily. 30 tablet 3  . hydrOXYzine (ATARAX/VISTARIL) 10 MG tablet Take 1-2 tablets (10-20 mg total) by mouth daily as needed for anxiety. Only for severe anxiety sx. 45 tablet 3  . ibuprofen (ADVIL,MOTRIN) 800 MG tablet     . losartan (COZAAR) 50  MG tablet TAKE ONE TABLET BY MOUTH DAILY 90 tablet 0  . metoprolol succinate (TOPROL-XL) 25 MG 24 hr tablet TAKE ONE TABLET BY MOUTH DAILY 90 tablet 0  . traZODone (DESYREL) 50 MG tablet TAKE ONE-HALF TO ONE TABLET BY MOUTH EVERY NIGHT AT BEDTIME AS NEEDED FOR SLEEP 30 tablet 2   No current facility-administered medications for this visit.      Musculoskeletal: Strength & Muscle Tone: UTA Gait & Station: normal Patient leans: N/A  Psychiatric Specialty Exam: Review of Systems  HENT: Positive for congestion and sinus pain.   Psychiatric/Behavioral: Negative for depression, hallucinations, substance abuse and suicidal ideas. The patient is not nervous/anxious.   All other systems reviewed and are negative.   There were no vitals taken for this visit.There is no height or weight on file to calculate BMI.  General Appearance: Casual  Eye Contact:  Fair  Speech:  Clear and Coherent  Volume:  Normal  Mood:  Euthymic  Affect:  Congruent  Thought Process:  Goal Directed and Descriptions of Associations: Intact  Orientation:  Full (Time, Place, and Person)  Thought Content: Logical   Suicidal Thoughts:  No  Homicidal Thoughts:  No  Memory:  Immediate;   Fair Recent;   Fair Remote;   Fair  Judgement:  Fair  Insight:  Fair  Psychomotor Activity:  Normal  Concentration:  Concentration: Fair and Attention Span: Fair  Recall:  [employer] of Knowledge: Fair  Language: Fair  Akathisia:  No   Handed:  Right  AIMS (if indicated): Denies tremors, rigidity  Assets:  Communication Skills Desire for Improvement Social Support  ADL's:  Intact  Cognition: WNL  Sleep:  Fair   Screenings: PHQ2-9     Office Visit from 10/02/2018 in [facility] Office Visit from 12/23/2016 in [place]  PHQ-2 Total Score  1  1  PHQ-9 Total Score  9  -       Assessment and Plan: Julia Cooke is a 68 year old Caucasian female who has a history of bipolar disorder was evaluated by telemedicine today.  Patient with psychosocial stressors of current pandemic.  She however is stable on medications.  Plan as noted below.  Plan Bipolar disorder type I-in remission Abilify 2.5 mg p.o. daily Wellbutrin XL 300 mg p.o. daily Hydroxyzine 10 to 20 mg p.o. daily PRN for severe anxiety attacks   Insomnia-improving Trazodone 25 to 50 mg p.o. nightly as needed  Discussed with patient that we need the following labs and she is on medications like Abilify-will order  lipid panel, hemoglobin A1c, prolactin as well as EKG to monitor QTC.  She will have a discussion with her primary care provider.  Follow-up in clinic in 3 months or sooner if needed.  December 30 at 2:30 PM I have spent atleast 15 minutes non face to face with patient today. More than 50 % of the time was spent for psychoeducation and supportive psychotherapy and care coordination. This note was generated in part or whole with voice recognition software. Voice recognition is usually quite accurate but there are transcription errors that can and very often do occur. I apologize for any typographical errors that were not detected and corrected.      Ursula Alert, MD 08/28/2019, 5:13 PM

## 2019-08-29 DIAGNOSIS — Z20822 Contact with and (suspected) exposure to covid-19: Secondary | ICD-10-CM

## 2019-08-31 ENCOUNTER — Other Ambulatory Visit: Payer: Self-pay

## 2019-08-31 ENCOUNTER — Ambulatory Visit (INDEPENDENT_AMBULATORY_CARE_PROVIDER_SITE_OTHER): Payer: PPO | Admitting: Internal Medicine

## 2019-08-31 DIAGNOSIS — Z20828 Contact with and (suspected) exposure to other viral communicable diseases: Secondary | ICD-10-CM | POA: Diagnosis not present

## 2019-08-31 DIAGNOSIS — Z20822 Contact with and (suspected) exposure to covid-19: Secondary | ICD-10-CM

## 2019-08-31 NOTE — Telephone Encounter (Signed)
Covid-19 test has been ordered and pt is aware that she needs to go be tested today and is aware of where to go. Scheduled a virtual visit with Dr. Derrel Nip at 11:30.

## 2019-08-31 NOTE — Assessment & Plan Note (Signed)
Patient's  symptoms may be due to a mild case of COVID. Advised patient to self quarantine for a minimum of 10 days from the onset of symptoms.  Continue  quarantine until patient is afebrile without antipyretic for 24 hours and other symptoms are improving  . covid testing of all family members advised as well.

## 2019-08-31 NOTE — Progress Notes (Signed)
Virtual Visit via [email or web address]  This visit type was conducted due to national recommendations for restrictions regarding the COVID-19 pandemic (e.g. social distancing).  This format is felt to be most appropriate for this patient at this time.  All issues noted in this document were discussed and addressed.  No physical exam was performed (except for noted visual exam findings with Video Visits).   I connected with@ on 08/31/19 at 11:30 AM EDT by a video enabled telemedicine application  and verified that I am speaking with the correct person using two identifiers. Location patient: home Location provider: work or home office Persons participating in the virtual visit: patient, provider  I discussed the limitations, risks, security and privacy concerns of performing an evaluation and management service by telephone and the availability of in person appointments. I also discussed with the patient that there may be a patient responsible charge related to this service. The patient expressed understanding and agreed to proceed.  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 Symptoms began with headache and rhinorrhea on sept 28.  By day 2 she develop a persistent productive cough acc'd by subjecive fevers and body aches.  She noticed some wheezing with supine position on day 3,  But has not been short of breath.  Her symptoms are improving   ROS: See pertinent positives and negatives per HPI.  Past Medical History:  Diagnosis Date  . Bipolar disorder ([place])    takes Abilify  . Depression   . Dysrhythmia 10/2016   LBBB on ekg...nothing to compare it to.  Marland Kitchen Heart murmur   . Hypertension   . Nephrolithiasis 2000   s/p lithotripsy  . 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 EXTRACORPOREAL SHOCK WAVE LITHOTRIPSY Left 01/06/2017   Procedure:  EXTRACORPOREAL SHOCK WAVE LITHOTRIPSY (ESWL);  Surgeon: Hollice Espy, MD;  Location: [facility] ORS;  Service: Urology;  Laterality: Left;  . LITHOTRIPSY  2000    Family History  Problem Relation Age of Onset  . Hyperlipidemia Mother   . Stroke Mother   . Hypertension Mother   . Hyperlipidemia Father   . Hypertension Father   . Cancer Sister        breast  . Breast cancer Sister 84  . Heart disease Brother        myocardial infarction  . Heart attack Brother   . Hypertension Brother   . Fibromyalgia Sister   . Hypertension Sister   . Heart Problems Sister   . Hypertension Sister   . Prostate cancer Neg Hx   . Kidney cancer Neg Hx   . Bladder Cancer Neg Hx     SOCIAL HX:  reports that she has never smoked. She has never used smokeless tobacco. She reports that she does not drink alcohol or use drugs.   Current Outpatient Medications:  .  ARIPiprazole (ABILIFY) 5 MG tablet, Take 0.5 tablets (2.5 mg total) by mouth daily., Disp: 15 tablet, Rfl: 3 .  augmented betamethasone dipropionate (DIPROLENE-AF) 0.05 % cream, , Disp: , Rfl:  .  buPROPion (WELLBUTRIN XL) 300 MG 24 hr tablet, Take 1 tablet (300 mg total) by mouth daily., Disp: 30 tablet, Rfl: 3 .  hydrOXYzine (ATARAX/VISTARIL) 10 MG tablet, Take 1-2 tablets (10-20 mg total) by mouth daily as needed for anxiety. Only for severe anxiety sx., Disp:  45 tablet, Rfl: 3 .  ibuprofen (ADVIL,MOTRIN) 800 MG tablet, , Disp: , Rfl:  .  losartan (COZAAR) 50 MG tablet, TAKE ONE TABLET BY MOUTH DAILY, Disp: 90 tablet, Rfl: 0 .  metoprolol succinate (TOPROL-XL) 25 MG 24 hr tablet, TAKE ONE TABLET BY MOUTH DAILY, Disp: 90 tablet, Rfl: 0 .  traZODone (DESYREL) 50 MG tablet, TAKE ONE-HALF TO ONE TABLET BY MOUTH EVERY NIGHT AT BEDTIME AS NEEDED FOR SLEEP, Disp: 30 tablet, Rfl: 2  EXAM:  VITALS per patient if applicable:  GENERAL: alert, oriented, appears well and in no acute distress  HEENT: atraumatic, conjunttiva clear, no obvious  abnormalities on inspection of external nose and ears  NECK: normal movements of the head and neck  LUNGS: on inspection no signs of respiratory distress, breathing rate appears normal, no obvious gross SOB, gasping or wheezing  CV: no obvious cyanosis  MS: moves all visible extremities without noticeable abnormality  PSYCH/NEURO: pleasant and cooperative, no obvious depression or anxiety, speech and thought processing grossly intact  ASSESSMENT AND PLAN:  Discussed the following assessment and plan:  Suspected COVID-19 virus infection  Suspected COVID-19 virus infection Patient's  symptoms may be due to a mild case of COVID. Advised patient to self quarantine for a minimum of 10 days from the onset of symptoms.  Continue  quarantine until patient is afebrile without antipyretic for 24 hours and other symptoms are improving  . covid testing of all family members advised as well.     I discussed the assessment and treatment plan with the patient. The patient was provided an opportunity to ask questions and all were answered. The patient agreed with the plan and demonstrated an understanding of the instructions.   The patient was advised to call back or seek an in-person evaluation if the symptoms worsen or if the condition fails to improve as anticipated.   I provided15  minutes of non-face-to-face time during this encounter reviewing patient's current problems ,  Providing counseling on the above mentioned problems , and coordination  of care .  Crecencio Mc, MD

## 2019-08-31 NOTE — Patient Instructions (Signed)
REGARDLESS OF YOUR TEST RESULTS:     You should remain in isolation  isolation for a minimum of 10 days from Monday sept 28 . You  must be fever free without the use of tylenol, aleve or motrin for  24 hours and all symptoms need to be improoved or resolved before breaking isolation

## 2019-09-01 LAB — NOVEL CORONAVIRUS, NAA: SARS-CoV-2, NAA: NOT DETECTED

## 2019-09-04 ENCOUNTER — Ambulatory Visit: Payer: PPO | Admitting: Licensed Clinical Social Worker

## 2019-09-04 ENCOUNTER — Other Ambulatory Visit: Payer: Self-pay

## 2019-09-07 ENCOUNTER — Other Ambulatory Visit: Payer: Self-pay | Admitting: Internal Medicine

## 2019-09-25 ENCOUNTER — Other Ambulatory Visit: Payer: Self-pay

## 2019-09-25 ENCOUNTER — Ambulatory Visit (INDEPENDENT_AMBULATORY_CARE_PROVIDER_SITE_OTHER): Payer: PPO | Admitting: Licensed Clinical Social Worker

## 2019-09-25 DIAGNOSIS — F3175 Bipolar disorder, in partial remission, most recent episode depressed: Secondary | ICD-10-CM | POA: Diagnosis not present

## 2019-09-25 NOTE — Progress Notes (Signed)
Virtual Visit via Telephone Note  I connected with Julia Cooke on 09/25/19 at 10:00 AM EDT by telephone and verified that I am speaking with the correct person using two identifiers.  Location: Patient: home Provider: office   I discussed the limitations, risks, security and privacy concerns of performing an evaluation and management service by telephone and the availability of in person appointments. I also discussed with the patient that there may be a patient responsible charge related to this service. The patient expressed understanding and agreed to proceed.       THERAPIST PROGRESS NOTE  Session Time: 30 min  Participation Level: Active  Type of Therapy: Individual Therapy  Treatment Goals addressed: Coping  Interventions: CBT and Motivational Interviewing  Summary: Julia Cooke is a 68 y.o. female who presents with a reduction in symptoms. LCSW offered education about common irrational fears and beliefs that contribute to anxiety. Reviewed and showed client how to use CBT/REBT strategies to recognize and re-frame irrational fears and self-talk as a means of increasing client's capacity to handle their anxiety more constructively. Therapist inquired about medication management and assisted her with her triggers.   Suicidal/Homicidal: No    Plan: Return again in 4 weeks.  Diagnosis: Axis I: Generalized Anxiety Disorder    Axis II: No diagnosis     I discussed the assessment and treatment plan with the patient. The patient was provided an opportunity to ask questions and all were answered. The patient agreed with the plan and demonstrated an understanding of the instructions.   The patient was advised to call back or seek an in-person evaluation if the symptoms worsen or if the condition fails to improve as anticipated.  I provided 30 minutes of non-face-to-face time during this encounter.   Lubertha South, LCSW

## 2019-10-02 ENCOUNTER — Other Ambulatory Visit: Payer: Self-pay | Admitting: Urology

## 2019-10-02 ENCOUNTER — Ambulatory Visit
Admission: RE | Admit: 2019-10-02 | Discharge: 2019-10-02 | Disposition: A | Discharge status: Home / Self Care | Payer: PPO | Source: Ambulatory Visit | Attending: Urology | Admitting: Urology

## 2019-10-02 ENCOUNTER — Other Ambulatory Visit: Payer: Self-pay

## 2019-10-02 ENCOUNTER — Encounter: Payer: Self-pay | Admitting: Urology

## 2019-10-02 ENCOUNTER — Ambulatory Visit: Payer: PPO | Admitting: Urology

## 2019-10-02 ENCOUNTER — Telehealth: Payer: Self-pay | Admitting: Urology

## 2019-10-02 VITALS — BP 137/82 | HR 80 | Temp 97.6°F | Ht 61.0 in | Wt 185.0 lb

## 2019-10-02 DIAGNOSIS — R109 Unspecified abdominal pain: Secondary | ICD-10-CM

## 2019-10-02 DIAGNOSIS — N201 Calculus of ureter: Secondary | ICD-10-CM | POA: Diagnosis not present

## 2019-10-02 DIAGNOSIS — Z87442 Personal history of urinary calculi: Secondary | ICD-10-CM | POA: Diagnosis not present

## 2019-10-02 DIAGNOSIS — N132 Hydronephrosis with renal and ureteral calculous obstruction: Secondary | ICD-10-CM

## 2019-10-02 DIAGNOSIS — N361 Urethral diverticulum: Secondary | ICD-10-CM | POA: Diagnosis not present

## 2019-10-02 DIAGNOSIS — R3129 Other microscopic hematuria: Secondary | ICD-10-CM

## 2019-10-02 DIAGNOSIS — N200 Calculus of kidney: Secondary | ICD-10-CM | POA: Diagnosis not present

## 2019-10-02 LAB — MICROSCOPIC EXAMINATION: RBC, Urine: >30 /hpf — AB (ref 0–2)

## 2019-10-02 LAB — URINALYSIS, COMPLETE
Bilirubin, UA: NEGATIVE
Glucose, UA: NEGATIVE
Ketones, UA: NEGATIVE
Nitrite, UA: NEGATIVE
Protein,UA: NEGATIVE
Specific Gravity, UA: 1.02 (ref 1.005–1.030)
Urobilinogen, Ur: 0.2 mg/dL (ref 0.2–1.0)
pH, UA: 5 (ref 5.0–7.5)

## 2019-10-02 LAB — POCT I-STAT CREATININE: Creatinine, Ser: 1 mg/dL (ref 0.44–1.00)

## 2019-10-02 MED ORDER — IOHEXOL 300 MG/ML  SOLN
125.0000 mL | Freq: Once | INTRAMUSCULAR | Status: AC | PRN
  Administered 2019-10-02: 17:00:00 125 mL via INTRAVENOUS

## 2019-10-02 MED ORDER — SULFAMETHOXAZOLE-TRIMETHOPRIM 800-160 MG PO TABS: 1.0000 | ORAL_TABLET | Freq: Two times a day (BID) | ORAL | 0 refills | Status: DC

## 2019-10-02 MED ORDER — TAMSULOSIN HCL 0.4 MG PO CAPS: 0.4000 mg | ORAL_CAPSULE | Freq: Every day | ORAL | 0 refills | Status: DC

## 2019-10-02 MED ORDER — OXYCODONE-ACETAMINOPHEN 10-325 MG PO TABS: 1.0000 | ORAL_TABLET | ORAL | 0 refills | Status: DC | PRN

## 2019-10-02 NOTE — Progress Notes (Signed)
10/02/2019 4:12 PM   [name] 1951-05-01 [id number]  Referring provider: Crecencio Mc, MD [place],  [place] [address]  Chief Complaint  Patient presents with   Flank Pain    HPI: Mrs. Hosbach is a 68 year old female with a history of nephrolithiasis and a history of hematuria associated with nephrolithiasis who presents for flank pain.  Contrast CT performed on 11/16/2016 noted an 8 mm left UVJ stone with mild left hydronephrosis. Correlation with urinalysis recommended to exclude superimposed UTI. Multiple smaller and nonobstructing left renal calculi as well as tiny right renal stone noted. No hydronephrosis on the right. Sigmoid diverticulosis. No evidence of bowel obstruction or active inflammation. Normal appendix.  Second stone event.  Last event 14 years ago treated with laser lithotripsy.    ESWL on 01/06/2017 for a left 8 mm UVJ stone.  KUB taken on 01/24/2017 (today) notes resolved left ureterovesical junction calculus since 01/06/2017.  Small bilateral nephrolithiasis.      Stone composition was calcium phosphate.    RUS performed on 02/24/2017 noted no hydronephrosis. 6 mm nonobstructing midpole stone in the right kidney.  Her UA last April was negative for [facility].    KUB taken on 03/07/2018 was negative for stones.    KUB on 06/27/2019 notes 2 right and 1 left small punctate stones.   She is having severe right sided pain that started last night that is radiating to the right flank pain.  8/10 pain.  She took acetaminophen and ibuprofen and it helped.  She was also having difficulty with urination.  She is having associated pink tinged urine, nausea, intermittency, hesitancy and straining to urinate.  Patient denies any fevers, chills or vomiting. Her UA today is positive for 6-10 WBC's, > 30 RBC's and a few bacteria.    CT urogram 10/02/2019 revealed there is a 3 mm calculus at the right ureterovesicular junction with moderate right  hydronephrosis and hydroureter. There are multiple additional bilateral renal calculi.  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 - patient contacted by phone for this result   PMH: Past Medical History:  Diagnosis Date   Bipolar disorder ([place])    takes Abilify   Depression    Dysrhythmia 10/2016   LBBB on ekg...nothing to compare it to.   Heart murmur    Hypertension    Nephrolithiasis 2000   s/p lithotripsy   Psychotic disorder ([place])    mania    Surgical History: Past Surgical History:  Procedure Laterality Date   EXTRACORPOREAL SHOCK WAVE LITHOTRIPSY Left 01/06/2017   Procedure: EXTRACORPOREAL SHOCK WAVE LITHOTRIPSY (ESWL);  Surgeon: Hollice Espy, MD;  Location: [facility] ORS;  Service: Urology;  Laterality: Left;   LITHOTRIPSY  2000    Home Medications:  Allergies as of 10/02/2019      Reactions   Penicillins Swelling   As a child      Medication List       Accurate as of October 02, 2019 11:59 PM. If you have any questions, ask your nurse or doctor.        ARIPiprazole 5 MG tablet Commonly known as: Abilify Take 0.5 tablets (2.5 mg total) by mouth daily.   augmented betamethasone dipropionate 0.05 % cream Commonly known as: DIPROLENE-AF   buPROPion 300 MG 24 hr tablet Commonly known as: Wellbutrin XL Take 1 tablet (300 mg total) by mouth daily.   hydrOXYzine 10 MG tablet  Commonly known as: ATARAX/VISTARIL Take 1-2 tablets (10-20 mg total) by mouth daily as needed for anxiety. Only for severe anxiety sx.   ibuprofen 800 MG tablet Commonly known as: ADVIL   losartan 50 MG tablet Commonly known as: COZAAR TAKE ONE TABLET BY MOUTH DAILY   metoprolol succinate 25 MG 24 hr tablet Commonly known as: TOPROL-XL TAKE ONE TABLET BY MOUTH DAILY   oxyCODONE-acetaminophen 10-325 MG tablet Commonly known as: Percocet Take 1 tablet by mouth every 4 (four) hours as needed for pain. Started by: [name], PA-C   sulfamethoxazole-trimethoprim 800-160 MG tablet Commonly known as: BACTRIM DS Take 1 tablet by mouth every 12 (twelve) hours. Started by: [name], PA-C   tamsulosin 0.4 MG Caps capsule Commonly known as: FLOMAX Take 1 capsule (0.4 mg total) by mouth daily. Started by: [name], PA-C   traZODone 50 MG tablet Commonly known as: DESYREL TAKE ONE-HALF TO ONE TABLET BY MOUTH EVERY NIGHT AT BEDTIME AS NEEDED FOR SLEEP       Allergies:  Allergies  Allergen Reactions   Penicillins Swelling    As a child    Family History: Family History  Problem Relation Age of Onset   Hyperlipidemia Mother    Stroke Mother    Hypertension Mother    Hyperlipidemia Father    Hypertension Father    Cancer Sister        breast   Breast cancer Sister 59   Heart disease Brother        myocardial infarction   Heart attack Brother    Hypertension Brother    Fibromyalgia Sister    Hypertension Sister    Heart Problems Sister    Hypertension Sister    Prostate cancer Neg Hx    Kidney cancer Neg Hx    Bladder Cancer Neg Hx     Social History:  reports that she has never smoked. She has never used smokeless tobacco. She reports that she does not drink alcohol or use drugs.  ROS: UROLOGY Frequent Urination?: Yes Hard to postpone urination?: No Burning/pain with urination?: No Get up at night to urinate?: Yes Leakage of urine?: Yes Urine stream starts and stops?: Yes Trouble starting stream?: Yes Do you have to strain to urinate?: Yes Blood in urine?: No Urinary tract infection?: No Sexually transmitted disease?: No Injury to kidneys or bladder?: No Painful intercourse?: No Weak stream?: No Currently pregnant?: No Vaginal bleeding?: No Last menstrual period?: n  Gastrointestinal Nausea?: Yes Vomiting?: No Indigestion/heartburn?: No Diarrhea?: No Constipation?: No  Constitutional Fever: No Night sweats?: No Weight loss?:  No Fatigue?: No  Skin Skin rash/lesions?: No Itching?: No  Eyes Blurred vision?: No Double vision?: No  Ears/Nose/Throat Sore throat?: No Sinus problems?: Yes  Hematologic/Lymphatic Swollen glands?: No Easy bruising?: No  Cardiovascular Leg swelling?: No Chest pain?: No  Respiratory Cough?: Yes Shortness of breath?: No  Endocrine Excessive thirst?: No  Musculoskeletal Back pain?: No Joint pain?: No  Neurological Headaches?: Yes Dizziness?: No  Psychologic Depression?: No Anxiety?: No  Physical Exam: BP 137/82    Pulse 80    Temp 97.6 F (36.4 C) (Temporal)    Ht 5\' 1"  (1.549 m)    Wt 185 lb (83.9 kg)    BMI 34.96 kg/m   Constitutional:  Well nourished. Alert and oriented, No acute distress. HEENT: [place] AT, moist mucus membranes.  Trachea midline, no masses. Cardiovascular: No clubbing, cyanosis, or edema. Respiratory: Normal respiratory effort, no increased work of breathing. GI:  Abdomen is soft, non tender, non distended, no abdominal masses. Liver and spleen not palpable.  No hernias appreciated.  Stool sample for occult testing is not indicated.   GU: Mild right CVA tenderness.  No bladder fullness or masses.   Neurologic: Grossly intact, no focal deficits, moving all 4 extremities. Psychiatric: Normal mood and affect.   Laboratory Data: Urinalysis Component     Latest Ref Rng & Units 10/02/2019          Specific Gravity, UA     1.005 - 1.030 1.020  pH, UA     5.0 - 7.5 5.0  Color, UA     Yellow Yellow  Appearance Ur     Clear Hazy (A)  Leukocytes,UA     Negative 1+ (A)  Protein,UA     Negative/Trace Negative  Glucose, UA     Negative Negative  Ketones, UA     Negative Negative  RBC, UA     Negative 3+ (A)  Bilirubin, UA     Negative Negative  Urobilinogen, Ur     0.2 - 1.0 mg/dL 0.2  Nitrite, UA     Negative Negative  Microscopic Examination      See below:   Component     Latest Ref Rng & Units 10/02/2019          WBC, UA      0 - 5 /hpf 6-10 (A)  RBC     0 - 2 /hpf >30 (A)  Epithelial Cells (non renal)     0 - 10 /hpf 0-10  Renal Epithel, UA     None seen /hpf 0-10 (A)  Bacteria, UA     None seen/Few Few   Lab Results  Component Value Date   WBC 7.2 01/20/2018   HGB 15.3 01/20/2018   HCT 44.6 01/20/2018   MCV 89.5 01/20/2018   PLT 232 01/20/2018    Lab Results  Component Value Date   CREATININE 1.00 10/02/2019    Lab Results  Component Value Date   HGBA1C 4.9 01/20/2018     Pertinent Imaging: CLINICAL DATA:  Flank pain.  EXAM: ABDOMEN - 1 VIEW  COMPARISON:  06/27/2019.  FINDINGS: Soft tissue structures are unremarkable. No bowel distention or free air. Large amount of stool noted throughout the colon making evaluation for renal and ureteral stones difficult. Faint bilateral renal calcifications again noted consistent with nephrolithiasis. Questionable punctate density noted over the left sacrum. This is not in the typical location for the left ureter and may represent stool or a faintly calcified phlebolith. Stable pelvic calcifications are noted most consistent phleboliths. Distal ureteral stones can not be excluded. Degenerative change thoracolumbar spine. Thoracolumbar spine scoliosis. Degenerative changes both hips. No acute bony abnormality  IMPRESSION: Large amount of stool noted throughout the colon making evaluation for renal ureteral stones difficult. Punctate calcifications noted over both kidneys consistent with nephrolithiasis. Punctate calcifications noted over the pelvis consistent with phleboliths. No definite ureteral stone noted. If symptoms persist nonenhanced CT suggested.   Electronically Signed   By: Marcello Moores  Register   On: 10/02/2019 16:18  CLINICAL DATA:  Microscopic hematuria, flank pain  EXAM: CT ABDOMEN AND PELVIS WITHOUT AND WITH CONTRAST  TECHNIQUE: Multidetector CT imaging of the abdomen and pelvis was performed following the  standard protocol before and following the bolus administration of intravenous contrast.  CONTRAST:  187mL OMNIPAQUE IOHEXOL 300 MG/ML  SOLN  COMPARISON:  11/16/2016  FINDINGS: Lower chest: No acute abnormality.  Hepatobiliary: No  solid liver abnormality is seen. No gallstones, gallbladder wall thickening, or biliary dilatation.  Pancreas: Unremarkable. No pancreatic ductal dilatation or surrounding inflammatory changes.  Spleen: Normal in size without significant abnormality.  Adrenals/Urinary Tract: Adrenal glands are unremarkable. There is a 3 mm calculus at the right ureterovesicular junction with moderate right hydronephrosis and hydroureter. There are multiple additional bilateral renal calculi. Bladder is unremarkable. 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  Stomach/Bowel: Stomach is within normal limits. Appendix appears normal. No evidence of bowel wall thickening, distention, or inflammatory changes. Occasional sigmoid diverticula.  Vascular/Lymphatic: No significant vascular findings are present. No enlarged abdominal or pelvic lymph nodes.  Reproductive: No mass or other significant abnormality.  Other: No abdominal wall hernia or abnormality. No abdominopelvic ascites.  Musculoskeletal: No acute or significant osseous findings.  IMPRESSION: 1. There is a 3 mm calculus at the right ureterovesicular junction with moderate right hydronephrosis and hydroureter. There are multiple additional bilateral renal calculi.  2. 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   Electronically Signed   By: Eddie Candle M.D.   On: 10/02/2019 17:20 I have independently reviewed the films and note the obstructing right UVJ stone and diverticulum.   Assessment & Plan:   1. Microscopic hematuria I explained to the patient that there are a number of  causes that can be associated with blood in the urine, such as stones, UTI's, damage to the urinary tract and/or cancer. We discussed that clinically she is presenting like a ureteral stone, but the X-ray did not demonstrate a definite ureteral stone.   At this time, we could give her supportive treatment with tamsulosin, pain medication, strainer and have her return next week for a follow up KUB and UA, obtain a CT Renal stone study with the understanding that we may need to repeat the study with contrast if an obstructing stone is not seen or proceed with a CT Urogram.  She has elected to proceed with a CT Urogram.  I explained to the patient that a contrast material will be injected into a vein and that in rare instances, an allergic reaction can result and may even life threatening (1:100,000)  The patient denies any allergies to contrast, iodine and/or seafood and is not taking metformin UA Urine culture CT Urogram notes a right UVJ stone  Patient started on Flomax, Percocet, Septra and given a strainer RTC in one week for follow up KUB Patient is advised that if they should start to experience pain that is not able to be controlled with pain medication, intractable nausea and/or vomiting and/or fevers greater than 103 or shaking chills to contact the office immediately or seek treatment in the emergency department for emergent intervention.    2. Right flank pain  UA positive for RBC's - clinical presentation of stone Due to UVJ stone  3. Bilateral nephrolithiasis Small stones seen in each kidney  4. Urethral diverticulum Will discuss further at next visit    Return in about 1 week (around 10/09/2019) for KUB and office visit .  [name], PA-C  [facility] [address] [place], [place] [address] [phone]

## 2019-10-02 NOTE — Telephone Encounter (Signed)
Patient called stating that she was having some flank pain and trouble urinating and feels like she may have another kidney stone? Should she come in and be seen with a KUB prior?  Please advise the patient on what she should do    [name]

## 2019-10-02 NOTE — Telephone Encounter (Signed)
Patient scheduled.

## 2019-10-03 ENCOUNTER — Telehealth: Payer: Self-pay | Admitting: Urology

## 2019-10-03 NOTE — Telephone Encounter (Signed)
Would you call Mrs. Rosselli and make her a follow up appointment in one week for KUB and office visit?

## 2019-10-03 NOTE — Telephone Encounter (Signed)
Appt made and I called and LMOM for pt to confirm appt.

## 2019-10-04 LAB — CULTURE, URINE COMPREHENSIVE

## 2019-10-09 NOTE — Progress Notes (Signed)
10/10/2019 9:55 AM   Julia Cooke December 29, 1950 [id number]  Referring provider: Crecencio Mc, MD [place],  [place] [address]  Chief Complaint  Patient presents with  . Nephrolithiasis    HPI: Julia Cooke is a 68 year old female with a history of nephrolithiasis and a history of hematuria associated with nephrolithiasis who presents for flank pain.  Contrast CT performed on 11/16/2016 noted an 8 mm left UVJ stone with mild left hydronephrosis. Correlation with urinalysis recommended to exclude superimposed UTI. Multiple smaller and nonobstructing left renal calculi as well as tiny right renal stone noted. No hydronephrosis on the right. Sigmoid diverticulosis. No evidence of bowel obstruction or active inflammation. Normal appendix.  Second stone event.  Last event 14 years ago treated with laser lithotripsy.    ESWL on 01/06/2017 for a left 8 mm UVJ stone.  KUB taken on 01/24/2017 (today) notes resolved left ureterovesical junction calculus since 01/06/2017.  Small bilateral nephrolithiasis.      Stone composition was calcium phosphate.    RUS performed on 02/24/2017 noted no hydronephrosis. 6 mm nonobstructing midpole stone in the right kidney.  Her UA last April was negative for [facility].    KUB taken on 03/07/2018 was negative for stones.    KUB on 06/27/2019 notes 2 right and 1 left small punctate stones.   CT urogram 10/02/2019 revealed there is a 3 mm calculus at the right ureterovesicular junction with moderate right hydronephrosis and hydroureter. There are multiple additional bilateral renal calculi.  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   KUB 10/10/2019 Small bilateral renal calculi.  Evaluation of the right UVJ stone is very limited due to pelvic phleboliths. Overall similar pattern of the pelvic phleboliths compared to the radiograph of 10/02/2019.  No bowel dilatation or evidence of  obstruction. No free air.  Today she states she feels well.  She is no longer having the severe right-sided pain, difficulty with urination, pink-tinged urine, nausea, intermittency, hesitancy or straining to urinate.  She has not captured a fragment.    PMH: Past Medical History:  Diagnosis Date  . Bipolar disorder ([place])    takes Abilify  . Depression   . Dysrhythmia 10/2016   LBBB on ekg...nothing to compare it to.  Marland Kitchen Heart murmur   . Hypertension   . Nephrolithiasis 2000   s/p lithotripsy  . Psychotic disorder ([place])    mania    Surgical History: Past Surgical History:  Procedure Laterality Date  . EXTRACORPOREAL SHOCK WAVE LITHOTRIPSY Left 01/06/2017   Procedure: EXTRACORPOREAL SHOCK WAVE LITHOTRIPSY (ESWL);  Surgeon: Hollice Espy, MD;  Location: [facility] ORS;  Service: Urology;  Laterality: Left;  . LITHOTRIPSY  2000    Home Medications:  Allergies as of 10/10/2019      Reactions   Penicillins Swelling   As a child      Medication List       Accurate as of October 10, 2019  9:55 AM. If you have any questions, ask your nurse or doctor.        ARIPiprazole 5 MG tablet Commonly known as: Abilify Take 0.5 tablets (2.5 mg total) by mouth daily.   augmented betamethasone dipropionate 0.05 % cream Commonly known as: DIPROLENE-AF   buPROPion 300 MG 24 hr tablet Commonly known as: Wellbutrin XL Take 1 tablet (300 mg total) by mouth daily.   hydrOXYzine 10 MG tablet Commonly known as: ATARAX/VISTARIL Take  1-2 tablets (10-20 mg total) by mouth daily as needed for anxiety. Only for severe anxiety sx.   ibuprofen 800 MG tablet Commonly known as: ADVIL   losartan 50 MG tablet Commonly known as: COZAAR TAKE ONE TABLET BY MOUTH DAILY   metoprolol succinate 25 MG 24 hr tablet Commonly known as: TOPROL-XL TAKE ONE TABLET BY MOUTH DAILY   oxyCODONE-acetaminophen 10-325 MG tablet Commonly known as: Percocet Take 1 tablet by mouth every 4 (four) hours as needed for  pain.   sulfamethoxazole-trimethoprim 800-160 MG tablet Commonly known as: BACTRIM DS Take 1 tablet by mouth every 12 (twelve) hours.   tamsulosin 0.4 MG Caps capsule Commonly known as: FLOMAX Take 1 capsule (0.4 mg total) by mouth daily.   traZODone 50 MG tablet Commonly known as: DESYREL TAKE ONE-HALF TO ONE TABLET BY MOUTH EVERY NIGHT AT BEDTIME AS NEEDED FOR SLEEP       Allergies:  Allergies  Allergen Reactions  . Penicillins Swelling    As a child    Family History: Family History  Problem Relation Age of Onset  . Hyperlipidemia Mother   . Stroke Mother   . Hypertension Mother   . Hyperlipidemia Father   . Hypertension Father   . Cancer Sister        breast  . Breast cancer Sister 74  . Heart disease Brother        myocardial infarction  . Heart attack Brother   . Hypertension Brother   . Fibromyalgia Sister   . Hypertension Sister   . Heart Problems Sister   . Hypertension Sister   . Prostate cancer Neg Hx   . Kidney cancer Neg Hx   . Bladder Cancer Neg Hx     Social History:  reports that she has never smoked. She has never used smokeless tobacco. She reports that she does not drink alcohol or use drugs.  ROS: UROLOGY Frequent Urination?: Yes Hard to postpone urination?: No Burning/pain with urination?: No Get up at night to urinate?: Yes Leakage of urine?: No Urine stream starts and stops?: No Trouble starting stream?: No Do you have to strain to urinate?: No Blood in urine?: No Urinary tract infection?: No Sexually transmitted disease?: No Injury to kidneys or bladder?: No Painful intercourse?: No Weak stream?: No Currently pregnant?: No Vaginal bleeding?: No Last menstrual period?: n  Gastrointestinal Nausea?: No Vomiting?: No Indigestion/heartburn?: No Diarrhea?: No Constipation?: No  Constitutional Fever: No Night sweats?: No Weight loss?: No Fatigue?: No  Skin Skin rash/lesions?: No Itching?: No  Eyes Blurred  vision?: No Double vision?: No  Ears/Nose/Throat Sore throat?: No Sinus problems?: Yes  Hematologic/Lymphatic Swollen glands?: No Easy bruising?: No  Cardiovascular Leg swelling?: No Chest pain?: No  Respiratory Cough?: No Shortness of breath?: No  Endocrine Excessive thirst?: No  Musculoskeletal Back pain?: Yes Joint pain?: Yes  Neurological Headaches?: No Dizziness?: No  Psychologic Depression?: No Anxiety?: Yes  Physical Exam: BP 118/63   Pulse 80   Ht 5\' 1"  (1.549 m)   Wt 183 lb (83 kg)   BMI 34.58 kg/m   Constitutional:  Well nourished. Alert and oriented, No acute distress. HEENT: [place] AT, moist mucus membranes.  Trachea midline, no masses. Cardiovascular: No clubbing, cyanosis, or edema. Respiratory: Normal respiratory effort, no increased work of breathing. Neurologic: Grossly intact, no focal deficits, moving all 4 extremities. Psychiatric: Normal mood and affect.   Laboratory Data: Urinalysis Component     Latest Ref Rng & Units 10/02/2019  Specific Gravity, UA     1.005 - 1.030 1.020  pH, UA     5.0 - 7.5 5.0  Color, UA     Yellow Yellow  Appearance Ur     Clear Hazy (A)  Leukocytes,UA     Negative 1+ (A)  Protein,UA     Negative/Trace Negative  Glucose, UA     Negative Negative  Ketones, UA     Negative Negative  RBC, UA     Negative 3+ (A)  Bilirubin, UA     Negative Negative  Urobilinogen, Ur     0.2 - 1.0 mg/dL 0.2  Nitrite, UA     Negative Negative  Microscopic Examination      See below:   Component     Latest Ref Rng & Units 10/02/2019          WBC, UA     0 - 5 /hpf 6-10 (A)  RBC     0 - 2 /hpf >30 (A)  Epithelial Cells (non renal)     0 - 10 /hpf 0-10  Renal Epithel, UA     None seen /hpf 0-10 (A)  Bacteria, UA     None seen/Few Few   Lab Results  Component Value Date   WBC 7.2 01/20/2018   HGB 15.3 01/20/2018   HCT 44.6 01/20/2018   MCV 89.5 01/20/2018   PLT 232 01/20/2018    Lab Results   Component Value Date   CREATININE 1.00 10/02/2019    Lab Results  Component Value Date   HGBA1C 4.9 01/20/2018     Pertinent Imaging: CLINICAL DATA:  68 year old female with right-sided kidney stone. Patient presenting for follow-up. No current symptoms.  EXAM: ABDOMEN - 1 VIEW  COMPARISON:  Abdominal radiograph dated 10/02/2019 and CT of the abdomen pelvis dated 10/02/2019  FINDINGS: Several small radiopaque foci over the renal silhouettes bilaterally consistent with kidney stones as seen on the prior radiograph and CT. These measure up to approximately 3 mm in the inferior pole of the left kidney.  Several pelvic phleboliths noted which make evaluation of the right UVJ stone seen on the prior CT difficult. Overall the pattern of the phleboliths in the pelvis appears similar to the radiograph of 10/02/2019.  There is no bowel dilatation or evidence of obstruction. No free air. Degenerative changes of the lower lumbar spine. No acute osseous pathology.  IMPRESSION: 1. Small bilateral renal calculi. 2. Evaluation of the right UVJ stone is very limited due to pelvic phleboliths. Overall similar pattern of the pelvic phleboliths compared to the radiograph of 10/02/2019. 3. No bowel dilatation or evidence of obstruction. No free air.   Electronically Signed   By: Anner Crete M.D.   On: 10/10/2019 09:32   Assessment & Plan:   1. Right UVJ stone Patient's KUB did not identify a specific right ureteral stone and patient is now asymptomatic  2. Hydronephrosis of the right kidney We will obtain a renal ultrasound to ensure the hydronephrosis resolves  3.  History of hematuria We will recheck UA once she has recovered from recent stone incident -will need cystoscopy to complete hematuria work-up if hematuria persists  3. Bilateral nephrolithiasis Small stones seen in each kidney  4. Urethral diverticulum Will refer to Dr. Matilde Sprang    Return in  about 2 weeks (around 10/24/2019) for RUS report and UA .  [name], PA-C  [facility] [address] [place], [place] [address] [phone]

## 2019-10-10 ENCOUNTER — Ambulatory Visit: Payer: PPO | Admitting: Urology

## 2019-10-10 ENCOUNTER — Ambulatory Visit
Admission: RE | Admit: 2019-10-10 | Discharge: 2019-10-10 | Disposition: A | Discharge status: Home / Self Care | Payer: PPO | Source: Ambulatory Visit | Attending: Urology | Admitting: Urology

## 2019-10-10 ENCOUNTER — Other Ambulatory Visit: Payer: Self-pay

## 2019-10-10 ENCOUNTER — Encounter: Payer: Self-pay | Admitting: Urology

## 2019-10-10 ENCOUNTER — Other Ambulatory Visit: Payer: Self-pay | Admitting: Internal Medicine

## 2019-10-10 VITALS — BP 118/63 | HR 80 | Ht 61.0 in | Wt 183.0 lb

## 2019-10-10 DIAGNOSIS — N132 Hydronephrosis with renal and ureteral calculous obstruction: Secondary | ICD-10-CM

## 2019-10-10 DIAGNOSIS — Z87448 Personal history of other diseases of urinary system: Secondary | ICD-10-CM

## 2019-10-10 DIAGNOSIS — N201 Calculus of ureter: Secondary | ICD-10-CM

## 2019-10-10 DIAGNOSIS — Z87442 Personal history of urinary calculi: Secondary | ICD-10-CM | POA: Insufficient documentation

## 2019-10-10 DIAGNOSIS — N200 Calculus of kidney: Secondary | ICD-10-CM | POA: Diagnosis not present

## 2019-10-10 DIAGNOSIS — R109 Unspecified abdominal pain: Secondary | ICD-10-CM

## 2019-10-10 DIAGNOSIS — R3129 Other microscopic hematuria: Secondary | ICD-10-CM

## 2019-10-10 NOTE — Progress Notes (Signed)
This encounter was created in error - please disregard.

## 2019-10-26 ENCOUNTER — Other Ambulatory Visit: Payer: Self-pay | Admitting: Psychiatry

## 2019-10-26 DIAGNOSIS — F5105 Insomnia due to other mental disorder: Secondary | ICD-10-CM

## 2019-10-28 ENCOUNTER — Other Ambulatory Visit: Payer: Self-pay | Admitting: Urology

## 2019-10-28 DIAGNOSIS — N201 Calculus of ureter: Secondary | ICD-10-CM

## 2019-11-05 ENCOUNTER — Ambulatory Visit
Admission: RE | Admit: 2019-11-05 | Discharge: 2019-11-05 | Disposition: A | Discharge status: Home / Self Care | Payer: PPO | Source: Ambulatory Visit | Attending: Urology | Admitting: Urology

## 2019-11-05 ENCOUNTER — Other Ambulatory Visit: Payer: Self-pay

## 2019-11-05 DIAGNOSIS — N201 Calculus of ureter: Secondary | ICD-10-CM

## 2019-11-05 DIAGNOSIS — R3129 Other microscopic hematuria: Secondary | ICD-10-CM | POA: Diagnosis not present

## 2019-11-05 DIAGNOSIS — N132 Hydronephrosis with renal and ureteral calculous obstruction: Secondary | ICD-10-CM | POA: Insufficient documentation

## 2019-11-05 NOTE — Progress Notes (Signed)
11/06/2019 8:36 AM   Julia Cooke [name] 18-Jul-1951 [id number]  Referring provider: Crecencio Mc, MD [place],  [place] [address]  Chief Complaint  Patient presents with  . Nephrolithiasis    HPI: Julia Cooke is a 68 year old female with a history of nephrolithiasis and a history of hematuria associated with nephrolithiasis who presents for flank pain.  Contrast CT performed on 11/16/2016 noted an 8 mm left UVJ stone with mild left hydronephrosis. Correlation with urinalysis recommended to exclude superimposed UTI. Multiple smaller and nonobstructing left renal calculi as well as tiny right renal stone noted. No hydronephrosis on the right. Sigmoid diverticulosis. No evidence of bowel obstruction or active inflammation. Normal appendix.  Second stone event.  Last event 14 years ago treated with laser lithotripsy.    ESWL on 01/06/2017 for a left 8 mm UVJ stone.  KUB taken on 01/24/2017 (today) notes resolved left ureterovesical junction calculus since 01/06/2017.  Small bilateral nephrolithiasis.      Stone composition was calcium phosphate.    RUS performed on 02/24/2017 noted no hydronephrosis. 6 mm nonobstructing midpole stone in the right kidney.  Her UA last April was negative for [facility].    KUB taken on 03/07/2018 was negative for stones.    KUB on 06/27/2019 notes 2 right and 1 left small punctate stones.   CT urogram 10/02/2019 revealed there is a 3 mm calculus at the right ureterovesicular junction with moderate right hydronephrosis and hydroureter. There are multiple additional bilateral renal calculi.  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   KUB 10/10/2019 Small bilateral renal calculi.  Evaluation of the right UVJ stone is very limited due to pelvic phleboliths. Overall similar pattern of the pelvic phleboliths compared to the radiograph of 10/02/2019.  No bowel dilatation or evidence of  obstruction. No free air.  RUS 11/05/2019 Echogenic foci within the bilateral renal collecting systems measuring up to 5 mm, consistent with renal calculi.  No hydronephrosis.  Renal cortical echogenicity within normal limits.  UA negative for microscopic hematuria.   Today she is is having baseline symptoms of urgency and intermittency.  Patient denies any gross hematuria, dysuria or suprapubic/flank pain.  Patient denies any fevers, chills, nausea or vomiting.   PMH: Past Medical History:  Diagnosis Date  . Bipolar disorder ([place])    takes Abilify  . Depression   . Dysrhythmia 10/2016   LBBB on ekg...nothing to compare it to.  Marland Kitchen Heart murmur   . Hypertension   . Nephrolithiasis 2000   s/p lithotripsy  . Psychotic disorder ([place])    mania    Surgical History: Past Surgical History:  Procedure Laterality Date  . EXTRACORPOREAL SHOCK WAVE LITHOTRIPSY Left 01/06/2017   Procedure: EXTRACORPOREAL SHOCK WAVE LITHOTRIPSY (ESWL);  Surgeon: Hollice Espy, MD;  Location: [facility] ORS;  Service: Urology;  Laterality: Left;  . LITHOTRIPSY  2000    Home Medications:  Allergies as of 11/06/2019      Reactions   Penicillins Swelling   As a child      Medication List       Accurate as of November 06, 2019 11:59 PM. If you have any questions, ask your nurse or doctor.        STOP taking these medications   oxyCODONE-acetaminophen 10-325 MG tablet Commonly known as: Percocet Stopped by: Wadell Craddock, PA-C   sulfamethoxazole-trimethoprim 800-160 MG tablet Commonly known as: BACTRIM DS Stopped by:  Aaria Happ, PA-C   traZODone 50 MG tablet Commonly known as: DESYREL Stopped by: [name], PA-C     TAKE these medications   ARIPiprazole 5 MG tablet Commonly known as: Abilify Take 0.5 tablets (2.5 mg total) by mouth daily.   augmented betamethasone dipropionate 0.05 % cream Commonly known as: DIPROLENE-AF   buPROPion 300 MG 24 hr tablet Commonly known as: Wellbutrin  XL Take 1 tablet (300 mg total) by mouth daily.   hydrOXYzine 10 MG tablet Commonly known as: ATARAX/VISTARIL Take 1-2 tablets (10-20 mg total) by mouth daily as needed for anxiety. Only for severe anxiety sx.   ibuprofen 800 MG tablet Commonly known as: ADVIL   losartan 50 MG tablet Commonly known as: COZAAR TAKE ONE TABLET BY MOUTH DAILY   metoprolol succinate 25 MG 24 hr tablet Commonly known as: TOPROL-XL TAKE ONE TABLET BY MOUTH DAILY   tamsulosin 0.4 MG Caps capsule Commonly known as: FLOMAX TAKE ONE CAPSULE BY MOUTH DAILY       Allergies:  Allergies  Allergen Reactions  . Penicillins Swelling    As a child    Family History: Family History  Problem Relation Age of Onset  . Hyperlipidemia Mother   . Stroke Mother   . Hypertension Mother   . Hyperlipidemia Father   . Hypertension Father   . Cancer Sister        breast  . Breast cancer Sister 41  . Heart disease Brother        myocardial infarction  . Heart attack Brother   . Hypertension Brother   . Fibromyalgia Sister   . Hypertension Sister   . Heart Problems Sister   . Hypertension Sister   . Prostate cancer Neg Hx   . Kidney cancer Neg Hx   . Bladder Cancer Neg Hx     Social History:  reports that she has never smoked. She has never used smokeless tobacco. She reports that she does not drink alcohol or use drugs.  ROS: UROLOGY Frequent Urination?: No Hard to postpone urination?: Yes Burning/pain with urination?: No Get up at night to urinate?: No Leakage of urine?: No Urine stream starts and stops?: Yes Trouble starting stream?: No Do you have to strain to urinate?: No Blood in urine?: No Urinary tract infection?: No Sexually transmitted disease?: No Injury to kidneys or bladder?: No Painful intercourse?: No Weak stream?: No Currently pregnant?: No Vaginal bleeding?: No Last menstrual period?: n  Gastrointestinal Nausea?: No Vomiting?: No Indigestion/heartburn?: No Diarrhea?:  No Constipation?: No  Constitutional Fever: No Night sweats?: No Weight loss?: No Fatigue?: No  Skin Skin rash/lesions?: No Itching?: No  Eyes Blurred vision?: No Double vision?: No  Ears/Nose/Throat Sore throat?: No Sinus problems?: No  Hematologic/Lymphatic Swollen glands?: No Easy bruising?: No  Cardiovascular Leg swelling?: No Chest pain?: No  Respiratory Cough?: No Shortness of breath?: No  Endocrine Excessive thirst?: No  Musculoskeletal Back pain?: No Joint pain?: No  Neurological Headaches?: No Dizziness?: No  Psychologic Depression?: No Anxiety?: No  Physical Exam: BP 134/81   Pulse 74   Ht 5\' 1"  (1.549 m)   Wt 181 lb 11.2 oz (82.4 kg)   BMI 34.33 kg/m   Constitutional:  Well nourished. Alert and oriented, No acute distress. HEENT: [place] AT, moist mucus membranes.  Trachea midline, no masses. Cardiovascular: No clubbing, cyanosis, or edema. Respiratory: Normal respiratory effort, no increased work of breathing. Neurologic: Grossly intact, no focal deficits, moving all 4 extremities. Psychiatric: Normal mood and  affect.   Laboratory Data: Urinalysis Component     Latest Ref Rng & Units 11/06/2019          Specific Gravity, UA     1.005 - 1.030 1.015  pH, UA     5.0 - 7.5 6.0  Color, UA     Yellow Yellow  Appearance Ur     Clear Hazy (A)  Leukocytes,UA     Negative 2+ (A)  Protein,UA     Negative/Trace Negative  Glucose, UA     Negative Negative  Ketones, UA     Negative Negative  RBC, UA     Negative Negative  Bilirubin, UA     Negative Negative  Urobilinogen, Ur     0.2 - 1.0 mg/dL 0.2  Nitrite, UA     Negative Negative  Microscopic Examination      See below:   Component     Latest Ref Rng & Units 11/06/2019          WBC, UA     0 - 5 /hpf 11-30 (A)  RBC     0 - 2 /hpf None seen  Epithelial Cells (non renal)     0 - 10 /hpf 0-10  Renal Epithel, UA     None seen /hpf 0-10 (A)  Bacteria, UA     None  seen/Few None seen   Lab Results  Component Value Date   WBC 7.2 01/20/2018   HGB 15.3 01/20/2018   HCT 44.6 01/20/2018   MCV 89.5 01/20/2018   PLT 232 01/20/2018    Lab Results  Component Value Date   CREATININE 1.00 10/02/2019    Lab Results  Component Value Date   HGBA1C 4.9 01/20/2018     Pertinent Imaging: CLINICAL DATA:  Right ureteral stone, hydronephrosis with urinary obstruction due to ureteral calculus. Microscopic hematuria, hydronephrosis.  EXAM: RENAL / URINARY TRACT ULTRASOUND COMPLETE  COMPARISON:  CT abdomen/pelvis 10/02/2019  FINDINGS: Right Kidney:  Renal measurements: 11.6 x 4.9 x 4.6 cm = volume: 137.3 mL. Renal cortical echogenicity within normal limits. 5 mm echogenic focus within the interpolar collecting system, likely reflecting a renal calculus. No hydronephrosis.  Left Kidney:  Renal measurements: 10.7 x 4.8 x 6.0 cm = volume: 159.8 mL. Renal cortical echogenicity within normal limits. Multiple echogenic foci within the left renal collecting system, the largest within the interpolar region measuring 5 mm, likely reflecting renal calculi. No hydronephrosis.  Bladder:  Appears normal for degree of bladder distention. Bilateral ureteral jets are demonstrated.  IMPRESSION: Echogenic foci within the bilateral renal collecting systems measuring up to 5 mm, consistent with renal calculi.  No hydronephrosis.  Renal cortical echogenicity within normal limits.   Electronically Signed   By: Kellie Simmering DO   On: 11/05/2019 22:33 I have independently reviewed the films and do not appreciate hydronephrosis.  Assessment & Plan:   1. Right UVJ stone Likely passed  2. Hydronephrosis of the right kidney Renal ultrasound shows the hydronephrosis has resolved  3.  History of hematuria No reports of gross hematuria  UA today is negative for hematuria  3. Bilateral nephrolithiasis Small stones seen in each kidney   4. Urethral diverticulum Explained to the patient what a urethral diverticulum is and that it may result in calculus formation in the urethra, chronic infections and rarely malignancy. Will schedule an appointment with Dr. Matilde Sprang for further evaluation management   Return for appointment with Dr. Matilde Sprang for urethral diveriticulum .  Skylie Hiott,  PA-C  [facility] [address] [place], [place] [address] [phone]

## 2019-11-06 ENCOUNTER — Encounter: Payer: Self-pay | Admitting: Urology

## 2019-11-06 ENCOUNTER — Ambulatory Visit (INDEPENDENT_AMBULATORY_CARE_PROVIDER_SITE_OTHER): Payer: PPO | Admitting: Urology

## 2019-11-06 ENCOUNTER — Ambulatory Visit: Payer: PPO | Admitting: Licensed Clinical Social Worker

## 2019-11-06 VITALS — BP 134/81 | HR 74 | Ht 61.0 in | Wt 181.7 lb

## 2019-11-06 DIAGNOSIS — N201 Calculus of ureter: Secondary | ICD-10-CM

## 2019-11-06 DIAGNOSIS — R3129 Other microscopic hematuria: Secondary | ICD-10-CM | POA: Diagnosis not present

## 2019-11-06 DIAGNOSIS — N361 Urethral diverticulum: Secondary | ICD-10-CM | POA: Diagnosis not present

## 2019-11-06 DIAGNOSIS — N132 Hydronephrosis with renal and ureteral calculous obstruction: Secondary | ICD-10-CM | POA: Diagnosis not present

## 2019-11-06 LAB — URINALYSIS, COMPLETE
Bilirubin, UA: NEGATIVE
Glucose, UA: NEGATIVE
Ketones, UA: NEGATIVE
Nitrite, UA: NEGATIVE
Protein,UA: NEGATIVE
RBC, UA: NEGATIVE
Specific Gravity, UA: 1.015 (ref 1.005–1.030)
Urobilinogen, Ur: 0.2 mg/dL (ref 0.2–1.0)
pH, UA: 6 (ref 5.0–7.5)

## 2019-11-06 LAB — MICROSCOPIC EXAMINATION
Bacteria, UA: NONE SEEN
RBC, Urine: NONE SEEN /hpf (ref 0–2)

## 2019-11-15 ENCOUNTER — Other Ambulatory Visit: Payer: Self-pay

## 2019-11-15 ENCOUNTER — Ambulatory Visit: Payer: PPO | Admitting: Licensed Clinical Social Worker

## 2019-11-28 ENCOUNTER — Ambulatory Visit (INDEPENDENT_AMBULATORY_CARE_PROVIDER_SITE_OTHER): Payer: PPO | Admitting: Psychiatry

## 2019-11-28 ENCOUNTER — Other Ambulatory Visit: Payer: Self-pay

## 2019-11-28 ENCOUNTER — Encounter: Payer: Self-pay | Admitting: Psychiatry

## 2019-11-28 DIAGNOSIS — F3176 Bipolar disorder, in full remission, most recent episode depressed: Secondary | ICD-10-CM

## 2019-11-28 DIAGNOSIS — G4701 Insomnia due to medical condition: Secondary | ICD-10-CM

## 2019-11-28 DIAGNOSIS — Z79899 Other long term (current) drug therapy: Secondary | ICD-10-CM | POA: Diagnosis not present

## 2019-11-28 DIAGNOSIS — Z9189 Other specified personal risk factors, not elsewhere classified: Secondary | ICD-10-CM | POA: Diagnosis not present

## 2019-11-28 MED ORDER — ARIPIPRAZOLE 2 MG PO TABS: 2.0000 mg | ORAL_TABLET | Freq: Every day | ORAL | 3 refills | Status: DC

## 2019-11-28 MED ORDER — BUPROPION HCL ER (XL) 300 MG PO TB24: 300.0000 mg | ORAL_TABLET | Freq: Every day | ORAL | 3 refills | Status: DC

## 2019-11-28 NOTE — Progress Notes (Signed)
Virtual Visit via Video Note  I connected with Julia Cooke on 11/28/19 at  2:30 PM EST by a video enabled telemedicine application and verified that I am speaking with the correct person using two identifiers.   I discussed the limitations of evaluation and management by telemedicine and the availability of in person appointments. The patient expressed understanding and agreed to proceed.     I discussed the assessment and treatment plan with the patient. The patient was provided an opportunity to ask questions and all were answered. The patient agreed with the plan and demonstrated an understanding of the instructions.   The patient was advised to call back or seek an in-person evaluation if the symptoms worsen or if the condition fails to improve as anticipated.   [place] [facility] MD OP Progress Note  11/28/2019 6:08 PM Julia Cooke  MRN:  [id number]  Chief Complaint:  Chief Complaint    Follow-up     HPI: Julia Cooke is a 68 year old Caucasian female, married, on SSI, lives in [place], has a history of bipolar disorder, insomnia was evaluated by telemedicine today.  Patient today reports she had a good Christmas holiday.  She enjoyed with her family.  She reports her mood symptoms continues to be stable.  Patient denies any significant anxiety or depressive symptoms.  She reports sleep is good.  She continues to be compliant on her Wellbutrin and Abilify as prescribed.  She denies side effects.  Patient denies any suicidality, homicidality or perceptual disturbances.  Patient symptoms have been stable and hence discussed reducing the dosage of her Abilify.  She is agreeable.  Also discussed getting metabolic panel as well as an EKG done.  She will get it done from her primary care provider. Visit Diagnosis:    ICD-10-CM   1. Bipolar disorder, in full remission, most recent episode depressed ([place])  F31.76 ARIPiprazole (ABILIFY) 2 MG tablet    buPROPion (WELLBUTRIN XL) 300 MG 24 hr  tablet  2. Insomnia due to medical condition  G47.01   3. High risk medication use  Z79.899 EKG 12-Lead  4. At risk for long QT syndrome  Z91.89 EKG 12-Lead    Past Psychiatric History: I have reviewed past psychiatric history from my progress note on 05/02/2018.  Past Medical History:  Past Medical History:  Diagnosis Date  . Bipolar disorder ([place])    takes Abilify  . Depression   . Dysrhythmia 10/2016   LBBB on ekg...nothing to compare it to.  Marland Kitchen Heart murmur   . Hypertension   . Nephrolithiasis 2000   s/p lithotripsy  . 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 EXTRACORPOREAL SHOCK WAVE LITHOTRIPSY Left 01/06/2017   Procedure: EXTRACORPOREAL SHOCK WAVE LITHOTRIPSY (ESWL);  Surgeon: Hollice Espy, MD;  Location: [facility] ORS;  Service: Urology;  Laterality: Left;  . LITHOTRIPSY  2000    Family Psychiatric History: Reviewed family psychiatric history from my progress note on 05/02/2018.  Family History:  Family History  Problem Relation Age of Onset  . Hyperlipidemia Mother   . Stroke Mother   . Hypertension Mother   . Hyperlipidemia Father   . Hypertension Father   . Cancer Sister        breast  . Breast cancer Sister 64  . Heart disease Brother        myocardial infarction  . Heart attack Brother   . Hypertension Brother   . Fibromyalgia Sister   .  Hypertension Sister   . Heart Problems Sister   . Hypertension Sister   . Prostate cancer Neg Hx   . Kidney cancer Neg Hx   . Bladder Cancer Neg Hx     Social History: Reviewed social history from my progress note on 05/02/2018. Social History   Socioeconomic History  . Marital status: Married    Spouse name: richard  . Number of children: 3  . Years of education: Not on file  . Highest education level: Some college, no degree  Occupational History    Comment: retired  Tobacco Use  . Smoking status: Never Smoker  . Smokeless tobacco: Never Used  Substance and  Sexual Activity  . Alcohol use: No    Alcohol/week: 0.0 - 2.0 standard drinks    Comment: 2-3 times weekly  . Drug use: No  . Sexual activity: Yes    Birth control/protection: Post-menopausal, None  Other Topics Concern  . Not on file  Social History Narrative   Patient lives at home with her husband of 85 years. They have 3 adult children (2 boys, 1 girl). They recently moved back to [place] in January. 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   Social Determinants of Health   Financial Resource Strain:   . Difficulty of Paying Living Expenses: Not on file  Food Insecurity:   . Worried About Charity fundraiser in the Last Year: Not on file  . Ran Out of Food in the Last Year: Not on file  Transportation Needs:   . Lack of Transportation (Medical): Not on file  . Lack of Transportation (Non-Medical): Not on file  Physical Activity:   . Days of Exercise per Week: Not on file  . Minutes of Exercise per Session: Not on file  Stress:   . Feeling of Stress : Not on file  Social Connections:   . Frequency of Communication with Friends and Family: Not on file  . Frequency of Social Gatherings with Friends and Family: Not on file  . Attends Religious Services: Not on file  . Active Member of Clubs or Organizations: Not on file  . Attends Archivist Meetings: Not on file  . Marital Status: Not on file    Allergies:  Allergies  Allergen Reactions  . Penicillins Swelling    As a child    Metabolic Disorder Labs: Lab Results  Component Value Date   HGBA1C 4.9 01/20/2018   MPG 94 01/20/2018   Lab Results  Component Value Date   PROLACTIN 3.2 (L) 01/20/2018   Lab Results  Component Value Date   CHOL 221 (H) 01/20/2018   TRIG 131 01/20/2018   HDL 53 01/20/2018   CHOLHDL 4.2 01/20/2018   VLDL 26 01/20/2018   LDLCALC 142 (H) 01/20/2018   LDLCALC 157 (H) 12/23/2016   Lab Results  Component  Value Date   TSH 0.82 10/02/2018   TSH 0.68 12/23/2016    Therapeutic Level Labs: No results found for: LITHIUM No results found for: VALPROATE No components found for:  CBMZ  Current Medications: Current Outpatient Medications  Medication Sig Dispense Refill  . ARIPiprazole (ABILIFY) 2 MG tablet Take 1 tablet (2 mg total) by mouth daily. 30 tablet 3  . augmented betamethasone dipropionate (DIPROLENE-AF) 0.05 % cream     . buPROPion (WELLBUTRIN XL) 300 MG 24 hr tablet Take 1 tablet (300 mg total) by mouth daily.  30 tablet 3  . hydrOXYzine (ATARAX/VISTARIL) 10 MG tablet Take 1-2 tablets (10-20 mg total) by mouth daily as needed for anxiety. Only for severe anxiety sx. 45 tablet 3  . ibuprofen (ADVIL,MOTRIN) 800 MG tablet     . losartan (COZAAR) 50 MG tablet TAKE ONE TABLET BY MOUTH DAILY 90 tablet 3  . metoprolol succinate (TOPROL-XL) 25 MG 24 hr tablet TAKE ONE TABLET BY MOUTH DAILY 90 tablet 0  . tamsulosin (FLOMAX) 0.4 MG CAPS capsule TAKE ONE CAPSULE BY MOUTH DAILY 30 capsule 0   No current facility-administered medications for this visit.     Musculoskeletal: Strength & Muscle Tone: UTA Gait & Station: normal Patient leans: N/A  Psychiatric Specialty Exam: Review of Systems  Psychiatric/Behavioral: Negative for agitation, behavioral problems, confusion, decreased concentration, dysphoric mood, hallucinations, self-injury, sleep disturbance and suicidal ideas. The patient is not nervous/anxious and is not hyperactive.   All other systems reviewed and are negative.   There were no vitals taken for this visit.There is no height or weight on file to calculate BMI.  General Appearance: Casual  Eye Contact:  Fair  Speech:  Normal Rate  Volume:  Normal  Mood:  Euthymic  Affect:  Congruent  Thought Process:  Goal Directed and Descriptions of Associations: Intact  Orientation:  Full (Time, Place, and Person)  Thought Content: Logical   Suicidal Thoughts:  No  Homicidal  Thoughts:  No  Memory:  Immediate;   Fair Recent;   Fair Remote;   Fair  Judgement:  Fair  Insight:  Fair  Psychomotor Activity:  Normal  Concentration:  Concentration: Fair and Attention Span: Fair  Recall:  [employer] of Knowledge: Fair  Language: Fair  Akathisia:  No  Handed:  Right  AIMS (if indicated): Denies tremors, rigidity  Assets:  Communication Skills Desire for Improvement Housing Intimacy Social Support  ADL's:  Intact  Cognition: WNL  Sleep:  Fair   Screenings: PHQ2-9     Office Visit from 10/02/2018 in [facility] Office Visit from 12/23/2016 in [facility]  PHQ-2 Total Score  1  1  PHQ-9 Total Score  9  -       Assessment and Plan: Julia Cooke is a 68 year old Caucasian female who has a history of bipolar disorder was evaluated by telemedicine today.  Patient with psychosocial stressors of the current pandemic however is currently doing well.  Patient will benefit from the following plan.  Plan Bipolar disorder type I in remission We will reduce Abilify to 2 mg p.o. daily.  She will monitor her symptoms closely Wellbutrin XL 300 mg p.o. daily Hydroxyzine  10 - 20 mg p.o. daily as needed for severe anxiety attacks   Insomnia-stable Trazodone 25 to 50 mg p.o. nightly as needed  Pending labs-lipid panel, hemoglobin A1c, prolactin as well as EKG to monitor QTC.  She will get it from her primary care provider.  Follow-up in clinic in 3 months or sooner if needed.  March 30 at 4:20 PM  I have spent atleast 15 minutes non face to face with patient today. More than 50 % of the time was spent for psychoeducation and supportive psychotherapy and care coordination. This note was generated in part or whole with voice recognition software. Voice recognition is usually quite accurate but there are transcription errors that can and very often do occur. I apologize for any typographical errors that were not detected and  corrected.      Bodhi Moradi,  MD 11/28/2019, 6:08 PM

## 2019-12-04 ENCOUNTER — Ambulatory Visit: Payer: PPO | Admitting: Licensed Clinical Social Worker

## 2019-12-17 ENCOUNTER — Encounter: Payer: Self-pay | Admitting: Urology

## 2019-12-17 ENCOUNTER — Other Ambulatory Visit: Payer: Self-pay

## 2019-12-17 ENCOUNTER — Ambulatory Visit: Payer: PPO | Admitting: Urology

## 2019-12-17 VITALS — BP 135/85 | HR 89 | Ht 60.0 in | Wt 182.0 lb

## 2019-12-17 DIAGNOSIS — N361 Urethral diverticulum: Secondary | ICD-10-CM

## 2019-12-17 DIAGNOSIS — N201 Calculus of ureter: Secondary | ICD-10-CM | POA: Diagnosis not present

## 2019-12-17 LAB — MICROSCOPIC EXAMINATION: RBC, Urine: NONE SEEN /hpf (ref 0–2)

## 2019-12-17 LAB — URINALYSIS, COMPLETE
Bilirubin, UA: NEGATIVE
Glucose, UA: NEGATIVE
Ketones, UA: NEGATIVE
Nitrite, UA: NEGATIVE
Protein,UA: NEGATIVE
Specific Gravity, UA: <1.005 — ABNORMAL LOW (ref 1.005–1.030)
Urobilinogen, Ur: 0.2 mg/dL (ref 0.2–1.0)
pH, UA: 6 (ref 5.0–7.5)

## 2019-12-17 NOTE — Progress Notes (Signed)
12/17/2019 4:00 PM   Julia Cooke 02-25-1951 [id number]  Referring provider: Crecencio Mc, MD [address] [place],  [place] [address]  Chief Complaint  Patient presents with  . Other    HPI: Patient in clinic with kidney stones.  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  Patient is asymptomatic does not get bladder infections.  Voids every 2-3 hours gets up once a night.  Rarely leaks with cough and wears no pads.  No bulging sensation or burning or postvoid dribbling  On pelvic examination she had a small elevation closer to the distal urethra and actually more in the right side with minimal on the left.  I felt this was normal elevation of the tissue.  I could not see any elevation or cystic mass mid urethra on the left side.  Picture was drawn.  Role of MRI discussed.  Natural history discussed.  Based on review of the CT scan the diverticulum is lateral just reaching the inferior edge of the urethra.  I am not surprised that there is no cystic mass palpable and that is otherwise asymptomatic.  I think it safest to get his MRI and watchful waiting is a good option.     PMH: Past Medical History:  Diagnosis Date  . Bipolar disorder ([place])    takes Abilify  . Depression   . Dysrhythmia 10/2016   LBBB on ekg...nothing to compare it to.  Marland Kitchen Heart murmur   . Hypertension   . Nephrolithiasis 2000   s/p lithotripsy  . Psychotic disorder ([place])    mania    Surgical History: Past Surgical History:  Procedure Laterality Date  . EXTRACORPOREAL SHOCK WAVE LITHOTRIPSY Left 01/06/2017   Procedure: EXTRACORPOREAL SHOCK WAVE LITHOTRIPSY (ESWL);  Surgeon: Hollice Espy, MD;  Location: [facility] ORS;  Service: Urology;  Laterality: Left;  . LITHOTRIPSY  2000    Home Medications:  Allergies as of 12/17/2019      Reactions   Penicillins Swelling   As a child      Medication List       Accurate as of December 17, 2019  4:00 PM. If you have any  questions, ask your nurse or doctor.        ARIPiprazole 2 MG tablet Commonly known as: Abilify Take 1 tablet (2 mg total) by mouth daily.   augmented betamethasone dipropionate 0.05 % cream Commonly known as: DIPROLENE-AF   buPROPion 300 MG 24 hr tablet Commonly known as: Wellbutrin XL Take 1 tablet (300 mg total) by mouth daily.   hydrOXYzine 10 MG tablet Commonly known as: ATARAX/VISTARIL Take 1-2 tablets (10-20 mg total) by mouth daily as needed for anxiety. Only for severe anxiety sx.   ibuprofen 800 MG tablet Commonly known as: ADVIL   losartan 50 MG tablet Commonly known as: COZAAR TAKE ONE TABLET BY MOUTH DAILY   metoprolol succinate 25 MG 24 hr tablet Commonly known as: TOPROL-XL TAKE ONE TABLET BY MOUTH DAILY   tamsulosin 0.4 MG Caps capsule Commonly known as: FLOMAX TAKE ONE CAPSULE BY MOUTH DAILY   traZODone 50 MG tablet Commonly known as: DESYREL Take 50 mg by mouth at bedtime.       Allergies:  Allergies  Allergen Reactions  . Penicillins Swelling    As a child    Family History: Family History  Problem Relation Age of Onset  . Hyperlipidemia Mother   . Stroke Mother   . Hypertension Mother   .  Hyperlipidemia Father   . Hypertension Father   . Cancer Sister        breast  . Breast cancer Sister 4  . Heart disease Brother        myocardial infarction  . Heart attack Brother   . Hypertension Brother   . Fibromyalgia Sister   . Hypertension Sister   . Heart Problems Sister   . Hypertension Sister   . Prostate cancer Neg Hx   . Kidney cancer Neg Hx   . Bladder Cancer Neg Hx     Social History:  reports that she has never smoked. She has never used smokeless tobacco. She reports that she does not drink alcohol or use drugs.  ROS: UROLOGY Frequent Urination?: No Hard to postpone urination?: Yes Burning/pain with urination?: No Get up at night to urinate?: No Leakage of urine?: Yes Urine stream starts and stops?: Yes Trouble  starting stream?: No Do you have to strain to urinate?: No Blood in urine?: No Urinary tract infection?: No Sexually transmitted disease?: No Injury to kidneys or bladder?: No Painful intercourse?: No Weak stream?: No Currently pregnant?: No Vaginal bleeding?: No  Gastrointestinal Nausea?: No Vomiting?: No Indigestion/heartburn?: No Diarrhea?: No Constipation?: No  Constitutional Fever: No Night sweats?: No Weight loss?: No Fatigue?: No  Skin Skin rash/lesions?: No Itching?: No  Eyes Blurred vision?: No Double vision?: No  Ears/Nose/Throat Sore throat?: No Sinus problems?: No  Hematologic/Lymphatic Swollen glands?: No Easy bruising?: No  Cardiovascular Leg swelling?: No Chest pain?: No  Respiratory Cough?: No Shortness of breath?: No  Endocrine Excessive thirst?: No  Musculoskeletal Back pain?: No Joint pain?: No  Neurological Headaches?: No Dizziness?: No  Psychologic Depression?: No Anxiety?: Yes  Physical Exam: BP 135/85 (BP Location: Left Arm, Patient Position: Sitting, Cuff Size: Normal)   Pulse 89   Ht 5' (1.524 m)   Wt 82.6 kg   BMI 35.54 kg/m   Constitutional:  Alert and oriented, No acute distress.  Laboratory Data: Lab Results  Component Value Date   WBC 7.2 01/20/2018   HGB 15.3 01/20/2018   HCT 44.6 01/20/2018   MCV 89.5 01/20/2018   PLT 232 01/20/2018    Lab Results  Component Value Date   CREATININE 1.00 10/02/2019    No results found for: PSA  No results found for: TESTOSTERONE  Lab Results  Component Value Date   HGBA1C 4.9 01/20/2018    Urinalysis    Component Value Date/Time   COLORURINE YELLOW (A) 11/16/2016 0340   APPEARANCEUR Hazy (A) 11/06/2019 1114   LABSPEC 1.017 11/16/2016 0340   LABSPEC 1.003 02/02/2013 1331   PHURINE 6.0 11/16/2016 0340   GLUCOSEU Negative 11/06/2019 1114   GLUCOSEU NEGATIVE 11/18/2014 1135   HGBUR SMALL (A) 11/16/2016 0340   BILIRUBINUR Negative 11/06/2019 1114    BILIRUBINUR Negative 02/02/2013 1331   KETONESUR NEGATIVE 11/16/2016 0340   PROTEINUR Negative 11/06/2019 1114   PROTEINUR NEGATIVE 11/16/2016 0340   UROBILINOGEN 0.2 11/18/2014 1135   UROBILINOGEN 0.2 11/18/2014 1134   NITRITE Negative 11/06/2019 1114   NITRITE NEGATIVE 11/16/2016 0340   LEUKOCYTESUR 2+ (A) 11/06/2019 1114   LEUKOCYTESUR 1+ 02/02/2013 1331    Pertinent Imaging:   Assessment & Plan: The stone and cancer discussed.  Watchful waiting discussed.  We decided not to get an MRI since it would not change management.  Repeat pelvic examination in 1 year  There are no diagnoses linked to this encounter.  No follow-ups on file.  Reece Packer, MD  [facility] [address] [place],  [address] [phone]

## 2019-12-18 DIAGNOSIS — L570 Actinic keratosis: Secondary | ICD-10-CM | POA: Diagnosis not present

## 2019-12-18 DIAGNOSIS — Z08 Encounter for follow-up examination after completed treatment for malignant neoplasm: Secondary | ICD-10-CM | POA: Diagnosis not present

## 2019-12-18 DIAGNOSIS — L821 Other seborrheic keratosis: Secondary | ICD-10-CM | POA: Diagnosis not present

## 2019-12-18 DIAGNOSIS — D225 Melanocytic nevi of trunk: Secondary | ICD-10-CM | POA: Diagnosis not present

## 2019-12-18 DIAGNOSIS — D2272 Melanocytic nevi of left lower limb, including hip: Secondary | ICD-10-CM | POA: Diagnosis not present

## 2019-12-18 DIAGNOSIS — Z85828 Personal history of other malignant neoplasm of skin: Secondary | ICD-10-CM | POA: Diagnosis not present

## 2019-12-18 DIAGNOSIS — D485 Neoplasm of uncertain behavior of skin: Secondary | ICD-10-CM | POA: Diagnosis not present

## 2019-12-31 ENCOUNTER — Other Ambulatory Visit: Payer: Self-pay | Admitting: Psychiatry

## 2020-01-15 DIAGNOSIS — X32XXXA Exposure to sunlight, initial encounter: Secondary | ICD-10-CM | POA: Diagnosis not present

## 2020-01-15 DIAGNOSIS — L821 Other seborrheic keratosis: Secondary | ICD-10-CM | POA: Diagnosis not present

## 2020-01-15 DIAGNOSIS — L570 Actinic keratosis: Secondary | ICD-10-CM | POA: Diagnosis not present

## 2020-02-03 ENCOUNTER — Other Ambulatory Visit: Payer: Self-pay | Admitting: Psychiatry

## 2020-02-05 ENCOUNTER — Telehealth: Payer: Self-pay | Admitting: Internal Medicine

## 2020-02-05 NOTE — Telephone Encounter (Signed)
Left message for patient to call back and schedule Medicare Annual Wellness Visit (AWV) either virtually or audio only.  No hx; please schedule at anytime with Denisa O'Brien-Blaney at [facility].

## 2020-02-26 ENCOUNTER — Ambulatory Visit (INDEPENDENT_AMBULATORY_CARE_PROVIDER_SITE_OTHER): Payer: PPO | Admitting: Psychiatry

## 2020-02-26 ENCOUNTER — Encounter: Payer: Self-pay | Admitting: Psychiatry

## 2020-02-26 ENCOUNTER — Other Ambulatory Visit: Payer: Self-pay

## 2020-02-26 DIAGNOSIS — F3176 Bipolar disorder, in full remission, most recent episode depressed: Secondary | ICD-10-CM | POA: Insufficient documentation

## 2020-02-26 DIAGNOSIS — G4701 Insomnia due to medical condition: Secondary | ICD-10-CM

## 2020-02-26 MED ORDER — HYDROXYZINE HCL 10 MG PO TABS: 10.0000 mg | ORAL_TABLET | Freq: Every day | ORAL | 2 refills | Status: DC | PRN

## 2020-02-26 MED ORDER — TRAZODONE HCL 50 MG PO TABS: ORAL_TABLET | ORAL | 2 refills | Status: DC

## 2020-02-26 MED ORDER — BUPROPION HCL ER (XL) 300 MG PO TB24: 300.0000 mg | ORAL_TABLET | Freq: Every day | ORAL | 2 refills | Status: DC

## 2020-02-26 MED ORDER — ARIPIPRAZOLE 2 MG PO TABS: 1.0000 mg | ORAL_TABLET | Freq: Every day | ORAL | 2 refills | Status: DC

## 2020-02-26 NOTE — Progress Notes (Signed)
Provider Location : [facility] Patient Location : Home  Virtual Visit via Video Note  I connected with Julia Cooke on 02/26/20 at  4:20 PM EDT by a video enabled telemedicine application and verified that I am speaking with the correct person using two identifiers.   I discussed the limitations of evaluation and management by telemedicine and the availability of in person appointments. The patient expressed understanding and agreed to proceed.    I discussed the assessment and treatment plan with the patient. The patient was provided an opportunity to ask questions and all were answered. The patient agreed with the plan and demonstrated an understanding of the instructions.   The patient was advised to call back or seek an in-person evaluation if the symptoms worsen or if the condition fails to improve as anticipated.   [place] MD OP Progress Note  02/26/2020 5:40 PM Julia Cooke  MRN:  [id number]  Chief Complaint:  Chief Complaint    Follow-up     HPI: Julia Cooke is a 69 year old Caucasian female, married, on SSI, lives in [place], has a history of bipolar disorder, insomnia was evaluated by telemedicine today.  Patient today reports she is currently doing okay.  She reports she does have anxiety regarding the pandemic and so on.  She however reports she is coping okay.  She tries to keep herself busy.  She is currently doing a lot of gardening which kind of keeps her occupied.  That does help.  She reports she is compliant on her medications.  She however reports she has been taking the Wellbutrin at night.  She takes the sleep medication and has sleep is okay.  Patient denies any suicidality, homicidality or perceptual disturbances.  Patient denies any other concerns today. Visit Diagnosis:    ICD-10-CM   1. Bipolar disorder, in full remission, most recent episode depressed ([place])  F31.76 ARIPiprazole (ABILIFY) 2 MG tablet    buPROPion (WELLBUTRIN XL) 300 MG 24 hr tablet    traZODone  (DESYREL) 50 MG tablet  2. Insomnia due to medical condition  G47.01 hydrOXYzine (ATARAX/VISTARIL) 10 MG tablet    Past Psychiatric History: I have reviewed past psychiatric history from my progress note on 05/02/2018.  Past Medical History:  Past Medical History:  Diagnosis Date  . Bipolar disorder ([place])    takes Abilify  . Depression   . Dysrhythmia 10/2016   LBBB on ekg...nothing to compare it to.  Marland Kitchen Heart murmur   . Hypertension   . Nephrolithiasis 2000   s/p lithotripsy  . Psychotic disorder ([place])    mania    Past Surgical History:  Procedure Laterality Date  . EXTRACORPOREAL SHOCK WAVE LITHOTRIPSY Left 01/06/2017   Procedure: EXTRACORPOREAL SHOCK WAVE LITHOTRIPSY (ESWL);  Surgeon: Hollice Espy, MD;  Location: [facility] ORS;  Service: Urology;  Laterality: Left;  . LITHOTRIPSY  2000    Family Psychiatric History: I have reviewed family psychiatric history from my progress note on 05/02/2018  Family History:  Family History  Problem Relation Age of Onset  . Hyperlipidemia Mother   . Stroke Mother   . Hypertension Mother   . Hyperlipidemia Father   . Hypertension Father   . Cancer Sister        breast  . Breast cancer Sister 98  . Heart disease Brother        myocardial infarction  . Heart attack Brother   . Hypertension Brother   . Fibromyalgia Sister   . Hypertension Sister   . Heart  Problems Sister   . Hypertension Sister   . Prostate cancer Neg Hx   . Kidney cancer Neg Hx   . Bladder Cancer Neg Hx     Social History: I have reviewed social history from my progress note on 05/02/2018 Social History   Socioeconomic History  . Marital status: Married    Spouse name: richard  . Number of children: 3  . Years of education: Not on file  . Highest education level: Some college, no degree  Occupational History    Comment: retired  Tobacco Use  . Smoking status: Never Smoker  . Smokeless tobacco: Never Used  Substance and Sexual Activity  . Alcohol use: No     Alcohol/week: 0.0 - 2.0 standard drinks    Comment: 2-3 times weekly  . Drug use: No  . Sexual activity: Yes    Birth control/protection: Post-menopausal, None  Other Topics Concern  . Not on file  Social History Narrative   Patient lives at home with her husband of 13 years. They have 3 adult children (2 boys, 1 girl). They recently moved back to [place] in January. 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   Social Determinants of Health   Financial Resource Strain:   . Difficulty of Paying Living Expenses:   Food Insecurity:   . Worried About Charity fundraiser in the Last Year:   . Arboriculturist in the Last Year:   Transportation Needs:   . Film/video editor (Medical):   Marland Kitchen Lack of Transportation (Non-Medical):   Physical Activity:   . Days of Exercise per Week:   . Minutes of Exercise per Session:   Stress:   . Feeling of Stress :   Social Connections:   . Frequency of Communication with Friends and Family:   . Frequency of Social Gatherings with Friends and Family:   . Attends Religious Services:   . Active Member of Clubs or Organizations:   . Attends Archivist Meetings:   Marland Kitchen Marital Status:     Allergies:  Allergies  Allergen Reactions  . Penicillins Swelling    As a child    Metabolic Disorder Labs: Lab Results  Component Value Date   HGBA1C 4.9 01/20/2018   MPG 94 01/20/2018   Lab Results  Component Value Date   PROLACTIN 3.2 (L) 01/20/2018   Lab Results  Component Value Date   CHOL 221 (H) 01/20/2018   TRIG 131 01/20/2018   HDL 53 01/20/2018   CHOLHDL 4.2 01/20/2018   VLDL 26 01/20/2018   LDLCALC 142 (H) 01/20/2018   LDLCALC 157 (H) 12/23/2016   Lab Results  Component Value Date   TSH 0.82 10/02/2018   TSH 0.68 12/23/2016    Therapeutic Level Labs: No results found for: LITHIUM No results found for: VALPROATE No components found for:  CBMZ  Current  Medications: Current Outpatient Medications  Medication Sig Dispense Refill  . ARIPiprazole (ABILIFY) 2 MG tablet Take 0.5 tablets (1 mg total) by mouth daily. 15 tablet 2  . augmented betamethasone dipropionate (DIPROLENE-AF) 0.05 % cream     . buPROPion (WELLBUTRIN XL) 300 MG 24 hr tablet Take 1 tablet (300 mg total) by mouth daily with breakfast. 30 tablet 2  . hydrOXYzine (ATARAX/VISTARIL) 10 MG tablet Take 1-2 tablets (10-20 mg total) by mouth daily as needed for anxiety. Only for severe anxiety sx. 45 tablet  2  . ibuprofen (ADVIL,MOTRIN) 800 MG tablet     . losartan (COZAAR) 50 MG tablet TAKE ONE TABLET BY MOUTH DAILY 90 tablet 3  . metoprolol succinate (TOPROL-XL) 25 MG 24 hr tablet TAKE ONE TABLET BY MOUTH DAILY 90 tablet 0  . tamsulosin (FLOMAX) 0.4 MG CAPS capsule TAKE ONE CAPSULE BY MOUTH DAILY 30 capsule 0  . traZODone (DESYREL) 50 MG tablet TAKE 0.5-1 TABLET BY MOUTH EVERY NIGHT AT BEDTIME AS NEEDED FOR SLEEP 30 tablet 2   No current facility-administered medications for this visit.     Musculoskeletal: Strength & Muscle Tone: UTA Gait & Station: normal Patient leans: N/A  Psychiatric Specialty Exam: Review of Systems  Psychiatric/Behavioral: Negative for agitation, behavioral problems, confusion, decreased concentration, dysphoric mood, hallucinations, self-injury, sleep disturbance and suicidal ideas. The patient is not nervous/anxious and is not hyperactive.   All other systems reviewed and are negative.   There were no vitals taken for this visit.There is no height or weight on file to calculate BMI.  General Appearance: Casual  Eye Contact:  Fair  Speech:  Clear and Coherent  Volume:  Normal  Mood:  Euthymic  Affect:  Congruent  Thought Process:  Goal Directed and Descriptions of Associations: Intact  Orientation:  Full (Time, Place, and Person)  Thought Content: Logical   Suicidal Thoughts:  No  Homicidal Thoughts:  No  Memory:  Immediate;   Fair Recent;    Fair Remote;   Fair  Judgement:  Fair  Insight:  Fair  Psychomotor Activity:  Normal  Concentration:  Concentration: Fair and Attention Span: Fair  Recall:  [employer] of Knowledge: Fair  Language: Fair  Akathisia:  No  Handed:  Right  AIMS (if indicated):UTA  Assets:  Communication Skills Desire for Improvement Housing Social Support  ADL's:  Intact  Cognition: WNL  Sleep:  Fair   Screenings: PHQ2-9     Office Visit from 10/02/2018 in [facility] Office Visit from 12/23/2016 in [facility]  PHQ-2 Total Score  1  1  PHQ-9 Total Score  9  --       Assessment and Plan: Julia Cooke is a 69 year old Caucasian female who has a history of bipolar disorder was evaluated by telemedicine today.  Patient with psychosocial stressors of the current pandemic however is currently stable on medications.  Plan as noted below.  Plan Bipolar disorder type I in remission Reduce Abilify to 1 mg p.o. daily.  Advised patient to monitor her symptoms and if she notices any worsening mood swings to restart Abilify 2 mg p.o. daily. Continue Wellbutrin XL 300 mg p.o. daily however encouraged her to take it with breakfast in the morning. Hydroxyzine 10 to 20 mg p.o. daily as needed for severe anxiety attacks  Insomnia-improving Trazodone 25 to 50 mg p.o. nightly as needed  Pending labs-lipid panel, hemoglobin A1c, prolactin as well as EKG to monitor QTC.  Follow-up in clinic in 3 months or sooner if needed.  I have spent atleast 20 minutes non- face to face with patient today. More than 50 % of the time was spent for preparing to see the patient ( e.g., review of test, records ),  ordering medications and test ,psychoeducation and supportive psychotherapy and care coordination,as well as documenting clinical information in electronic health record. This note was generated in part or whole with voice recognition software. Voice recognition is usually quite accurate but  there are transcription errors that can and very often  do occur. I apologize for any typographical errors that were not detected and corrected.       Ursula Alert, MD 02/26/2020, 5:40 PM

## 2020-03-14 ENCOUNTER — Other Ambulatory Visit: Payer: Self-pay | Admitting: Internal Medicine

## 2020-05-26 ENCOUNTER — Encounter: Payer: Self-pay | Admitting: Psychiatry

## 2020-05-26 ENCOUNTER — Other Ambulatory Visit: Payer: Self-pay

## 2020-05-26 ENCOUNTER — Telehealth (INDEPENDENT_AMBULATORY_CARE_PROVIDER_SITE_OTHER): Payer: PPO | Admitting: Psychiatry

## 2020-05-26 DIAGNOSIS — G4701 Insomnia due to medical condition: Secondary | ICD-10-CM

## 2020-05-26 DIAGNOSIS — F3176 Bipolar disorder, in full remission, most recent episode depressed: Secondary | ICD-10-CM | POA: Diagnosis not present

## 2020-05-26 DIAGNOSIS — Z79899 Other long term (current) drug therapy: Secondary | ICD-10-CM

## 2020-05-26 DIAGNOSIS — E785 Hyperlipidemia, unspecified: Secondary | ICD-10-CM

## 2020-05-26 MED ORDER — TRAZODONE HCL 50 MG PO TABS: ORAL_TABLET | ORAL | 2 refills | Status: DC

## 2020-05-26 MED ORDER — BUPROPION HCL ER (XL) 300 MG PO TB24: 300.0000 mg | ORAL_TABLET | Freq: Every day | ORAL | 2 refills | Status: DC

## 2020-05-26 MED ORDER — HYDROXYZINE HCL 10 MG PO TABS: 10.0000 mg | ORAL_TABLET | Freq: Every day | ORAL | 2 refills | Status: DC | PRN

## 2020-05-26 NOTE — Progress Notes (Signed)
Provider Location : [facility] Patient Location : Home  Virtual Visit via Video Note  I connected with Julia Cooke on 05/26/20 at  4:00 PM EDT by a video enabled telemedicine application and verified that I am speaking with the correct person using two identifiers.   I discussed the limitations of evaluation and management by telemedicine and the availability of in person appointments. The patient expressed understanding and agreed to proceed.     I discussed the assessment and treatment plan with the patient. The patient was provided an opportunity to ask questions and all were answered. The patient agreed with the plan and demonstrated an understanding of the instructions.   The patient was advised to call back or seek an in-person evaluation if the symptoms worsen or if the condition fails to improve as anticipated.   [place] MD OP Progress Note  05/26/2020 4:14 PM Julia Cooke  MRN:  [id number]  Chief Complaint:  Chief Complaint    Follow-up     HPI: Julia Cooke is a 69 year old Caucasian female, married, on SSI, lives in [place], has a history of bipolar disorder, insomnia was evaluated by telemedicine today.  Patient today reports she is currently doing well.  She reports she currently does not have any significant mood swings.  She is sleeping well on the trazodone.  She is compliant on all of her medications except the Abilify.  She reports she ran out of Abilify couple of weeks ago and has not taken it since then.  She is doing well without it.  Patient denies any suicidality, homicidality or perceptual disturbances.  He has not been able to get the labs as recommended last visit.  She agrees to go to her primary care provider.  Patient denies any other concerns today.    Visit Diagnosis:    ICD-10-CM   1. Bipolar disorder, in full remission, most recent episode depressed ([place])  F31.76 buPROPion (WELLBUTRIN XL) 300 MG 24 hr tablet    traZODone (DESYREL) 50 MG  tablet  2. Insomnia due to medical condition  G47.01 hydrOXYzine (ATARAX/VISTARIL) 10 MG tablet    Past Psychiatric History: I have reviewed past psychiatric history from my progress note on 05/02/2018.  Past Medical History:  Past Medical History:  Diagnosis Date  . Bipolar disorder ([place])    takes Abilify  . Depression   . Dysrhythmia 10/2016   LBBB on ekg...nothing to compare it to.  Marland Kitchen Heart murmur   . Hypertension   . Nephrolithiasis 2000   s/p lithotripsy  . Psychotic disorder ([place])    mania    Past Surgical History:  Procedure Laterality Date  . EXTRACORPOREAL SHOCK WAVE LITHOTRIPSY Left 01/06/2017   Procedure: EXTRACORPOREAL SHOCK WAVE LITHOTRIPSY (ESWL);  Surgeon: Hollice Espy, MD;  Location: [facility] ORS;  Service: Urology;  Laterality: Left;  . LITHOTRIPSY  2000    Family Psychiatric History: I have reviewed family psychiatric history from my progress note on 05/02/2018.  Family History:  Family History  Problem Relation Age of Onset  . Hyperlipidemia Mother   . Stroke Mother   . Hypertension Mother   . Hyperlipidemia Father   . Hypertension Father   . Cancer Sister        breast  . Breast cancer Sister 41  . Heart disease Brother        myocardial infarction  . Heart attack Brother   . Hypertension Brother   . Fibromyalgia Sister   . Hypertension Sister   .  Heart Problems Sister   . Hypertension Sister   . Prostate cancer Neg Hx   . Kidney cancer Neg Hx   . Bladder Cancer Neg Hx     Social History: I have reviewed social history from my progress note on 05/02/2018. Social History   Socioeconomic History  . Marital status: Married    Spouse name: richard  . Number of children: 3  . Years of education: Not on file  . Highest education level: Some college, no degree  Occupational History    Comment: retired  Tobacco Use  . Smoking status: Never Smoker  . Smokeless tobacco: Never Used  Vaping Use  . Vaping Use: Never used  Substance and Sexual  Activity  . Alcohol use: No    Alcohol/week: 0.0 - 2.0 standard drinks    Comment: 2-3 times weekly  . Drug use: No  . Sexual activity: Yes    Birth control/protection: Post-menopausal, None  Other Topics Concern  . Not on file  Social History Narrative   Patient lives at home with her husband of 63 years. They have 3 adult children (2 boys, 1 girl). They recently moved back to [place] in January. 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   Social Determinants of Health   Financial Resource Strain:   . Difficulty of Paying Living Expenses:   Food Insecurity:   . Worried About Charity fundraiser in the Last Year:   . Arboriculturist in the Last Year:   Transportation Needs:   . Film/video editor (Medical):   Marland Kitchen Lack of Transportation (Non-Medical):   Physical Activity:   . Days of Exercise per Week:   . Minutes of Exercise per Session:   Stress:   . Feeling of Stress :   Social Connections:   . Frequency of Communication with Friends and Family:   . Frequency of Social Gatherings with Friends and Family:   . Attends Religious Services:   . Active Member of Clubs or Organizations:   . Attends Archivist Meetings:   Marland Kitchen Marital Status:     Allergies:  Allergies  Allergen Reactions  . Penicillins Swelling    As a child    Metabolic Disorder Labs: Lab Results  Component Value Date   HGBA1C 4.9 01/20/2018   MPG 94 01/20/2018   Lab Results  Component Value Date   PROLACTIN 3.2 (L) 01/20/2018   Lab Results  Component Value Date   CHOL 221 (H) 01/20/2018   TRIG 131 01/20/2018   HDL 53 01/20/2018   CHOLHDL 4.2 01/20/2018   VLDL 26 01/20/2018   LDLCALC 142 (H) 01/20/2018   LDLCALC 157 (H) 12/23/2016   Lab Results  Component Value Date   TSH 0.82 10/02/2018   TSH 0.68 12/23/2016    Therapeutic Level Labs: No results found for: LITHIUM No results found for: VALPROATE No  components found for:  CBMZ  Current Medications: Current Outpatient Medications  Medication Sig Dispense Refill  . augmented betamethasone dipropionate (DIPROLENE-AF) 0.05 % cream     . buPROPion (WELLBUTRIN XL) 300 MG 24 hr tablet Take 1 tablet (300 mg total) by mouth daily with breakfast. 30 tablet 2  . hydrOXYzine (ATARAX/VISTARIL) 10 MG tablet Take 1-2 tablets (10-20 mg total) by mouth daily as needed for anxiety. Only for severe anxiety sx. 45 tablet 2  . ibuprofen (ADVIL,MOTRIN) 800 MG tablet     .  losartan (COZAAR) 50 MG tablet TAKE ONE TABLET BY MOUTH DAILY 90 tablet 3  . metoprolol succinate (TOPROL-XL) 25 MG 24 hr tablet TAKE ONE TABLET BY MOUTH DAILY 90 tablet 0  . tamsulosin (FLOMAX) 0.4 MG CAPS capsule TAKE ONE CAPSULE BY MOUTH DAILY 30 capsule 0  . traZODone (DESYREL) 50 MG tablet TAKE 0.5-1 TABLET BY MOUTH EVERY NIGHT AT BEDTIME AS NEEDED FOR SLEEP 30 tablet 2   No current facility-administered medications for this visit.     Musculoskeletal: Strength & Muscle Tone: UTA Gait & Station: normal Patient leans: N/A  Psychiatric Specialty Exam: Review of Systems  Psychiatric/Behavioral: Negative for agitation, behavioral problems, confusion, decreased concentration, dysphoric mood, hallucinations, self-injury, sleep disturbance and suicidal ideas. The patient is not nervous/anxious and is not hyperactive.   All other systems reviewed and are negative.   There were no vitals taken for this visit.There is no height or weight on file to calculate BMI.  General Appearance: Casual  Eye Contact:  Fair  Speech:  Clear and Coherent  Volume:  Normal  Mood:  Euthymic  Affect:  Congruent  Thought Process:  Goal Directed and Descriptions of Associations: Intact  Orientation:  Full (Time, Place, and Person)  Thought Content: Logical   Suicidal Thoughts:  No  Homicidal Thoughts:  No  Memory:  Immediate;   Fair Recent;   Fair Remote;   Fair  Judgement:  Fair  Insight:  Fair   Psychomotor Activity:  Normal  Concentration:  Concentration: Fair and Attention Span: Fair  Recall:  [employer] of Knowledge: Fair  Language: Fair  Akathisia:  No  Handed:  Right  AIMS (if indicated): UTA  Assets:  Communication Skills Desire for Improvement Housing Social Support  ADL's:  Intact  Cognition: WNL  Sleep:  Fair   Screenings: PHQ2-9     Office Visit from 10/02/2018 in [place] Office Visit from 12/23/2016 in [facility]  PHQ-2 Total Score 1 1  PHQ-9 Total Score 9 --       Assessment and Plan: Julia Cooke is a 69 year old Caucasian female who has a history of bipolar disorder was evaluated by telemedicine today.  Patient with psychosocial stressors of the current pandemic, is currently stable ON medications.  Patient stopped taking Abilify 2 weeks ago and is currently doing well.  Plan as noted below.  Plan Bipolar disorder type I in remission Discontinue Abilify for noncompliance.  Patient however reports that her symptoms are stable and hence will not restart it. However discussed with patient that if her mood swings recurs she can restart taking Abilify 1 mg p.o. daily.  Patient advised to call the clinic for refills at that time. Wellbutrin XL 300 mg p.o. daily. Hydroxyzine 10 to 20 mg p.o. daily as needed for severe anxiety attacks  Insomnia-improving Trazodone 25 to 50 mg p.o. nightly as needed  Pending labs-lipid panel, hemoglobin A1c, prolactin.  Encouraged patient to get it done at her primary care office.  Pending EKG to monitor QTC  Follow-up in clinic in 3 months or sooner if needed.  I have spent atleast 20 minutes non face to face with patient today. More than 50 % of the time was spent for preparing to see the patient ( e.g., review of test, records ), ordering medications and test ,psychoeducation and supportive psychotherapy and care coordination,as well as documenting clinical information in  electronic health record. This note was generated in part or whole with voice recognition  software. Voice recognition is usually quite accurate but there are transcription errors that can and very often do occur. I apologize for any typographical errors that were not detected and corrected.        Ursula Alert, MD 05/26/2020, 4:14 PM

## 2020-06-11 ENCOUNTER — Other Ambulatory Visit (INDEPENDENT_AMBULATORY_CARE_PROVIDER_SITE_OTHER): Payer: PPO

## 2020-06-11 ENCOUNTER — Other Ambulatory Visit: Payer: Self-pay

## 2020-06-11 DIAGNOSIS — Z79899 Other long term (current) drug therapy: Secondary | ICD-10-CM | POA: Diagnosis not present

## 2020-06-11 DIAGNOSIS — E785 Hyperlipidemia, unspecified: Secondary | ICD-10-CM

## 2020-06-11 LAB — COMPREHENSIVE METABOLIC PANEL
ALT: 15 U/L (ref 0–35)
AST: 16 U/L (ref 0–37)
Albumin: 4.2 g/dL (ref 3.5–5.2)
Alkaline Phosphatase: 59 U/L (ref 39–117)
BUN: 21 mg/dL (ref 6–23)
CO2: 28 mEq/L (ref 19–32)
Calcium: 9.4 mg/dL (ref 8.4–10.5)
Chloride: 105 mEq/L (ref 96–112)
Creatinine, Ser: 0.84 mg/dL (ref 0.40–1.20)
GFR: 67.33 mL/min (ref >60.00)
Glucose, Bld: 100 mg/dL — ABNORMAL HIGH (ref 70–99)
Potassium: 5 mEq/L (ref 3.5–5.1)
Sodium: 138 mEq/L (ref 135–145)
Total Bilirubin: 1.3 mg/dL — ABNORMAL HIGH (ref 0.2–1.2)
Total Protein: 6.4 g/dL (ref 6.0–8.3)

## 2020-06-11 LAB — CBC WITH DIFFERENTIAL/PLATELET
Basophils Absolute: 0.1 10*3/uL (ref 0.0–0.1)
Basophils Relative: 0.7 % (ref 0.0–3.0)
Eosinophils Absolute: 0.1 10*3/uL (ref 0.0–0.7)
Eosinophils Relative: 1.8 % (ref 0.0–5.0)
HCT: 41 % (ref 36.0–46.0)
Hemoglobin: 14.3 g/dL (ref 12.0–15.0)
Lymphocytes Relative: 21.2 % (ref 12.0–46.0)
Lymphs Abs: 1.8 10*3/uL (ref 0.7–4.0)
MCHC: 34.8 g/dL (ref 30.0–36.0)
MCV: 90.3 fl (ref 78.0–100.0)
Monocytes Absolute: 0.7 10*3/uL (ref 0.1–1.0)
Monocytes Relative: 8.3 % (ref 3.0–12.0)
Neutro Abs: 5.6 10*3/uL (ref 1.4–7.7)
Neutrophils Relative %: 68 % (ref 43.0–77.0)
Platelets: 213 10*3/uL (ref 150.0–400.0)
RBC: 4.54 Mil/uL (ref 3.87–5.11)
RDW: 12.8 % (ref 11.5–15.5)
WBC: 8.3 10*3/uL (ref 4.0–10.5)

## 2020-06-11 LAB — LIPID PANEL
Cholesterol: 191 mg/dL (ref 0–200)
HDL: 43.9 mg/dL (ref >39.00)
LDL Cholesterol: 129 mg/dL — ABNORMAL HIGH (ref 0–99)
NonHDL: 146.81
Total CHOL/HDL Ratio: 4
Triglycerides: 90 mg/dL (ref 0.0–149.0)
VLDL: 18 mg/dL (ref 0.0–40.0)

## 2020-06-11 LAB — TSH: TSH: 0.69 u[IU]/mL (ref 0.35–4.50)

## 2020-06-11 LAB — HEMOGLOBIN A1C: Hgb A1c MFr Bld: 4.8 % (ref 4.6–6.5)

## 2020-06-13 ENCOUNTER — Other Ambulatory Visit: Payer: Self-pay | Admitting: Internal Medicine

## 2020-06-17 IMAGING — US US RENAL
1 series · 14 of 25 positions shown · non-contrast
Comparison: CT abdomen/pelvis 10/02/2019

CLINICAL DATA: Right ureteral stone, hydronephrosis with urinary
obstruction due to ureteral calculus. Microscopic hematuria,
hydronephrosis.

EXAM:
RENAL / URINARY TRACT ULTRASOUND COMPLETE

[Series 1: us renal · 0.25mm/px · 14 of 48 slices shown]
[im 1/48]
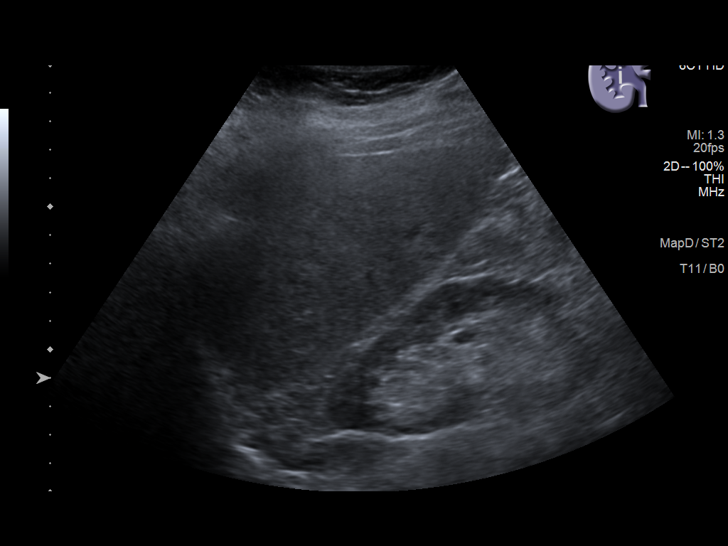
[im 4/48]
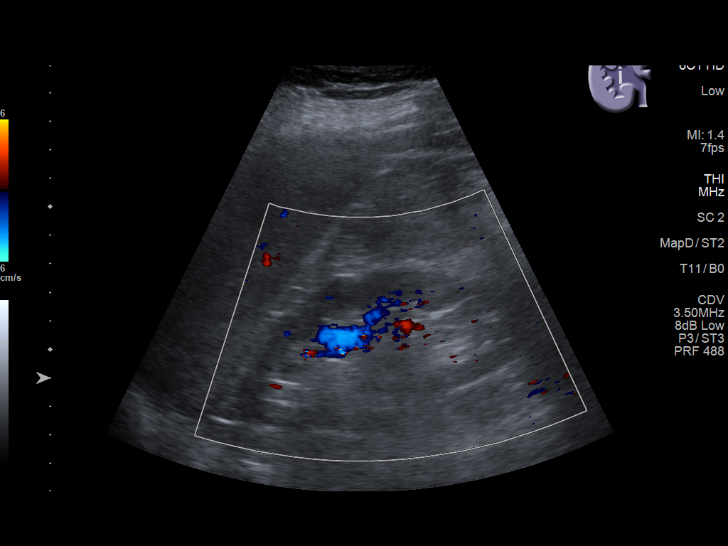
[im 8/48]
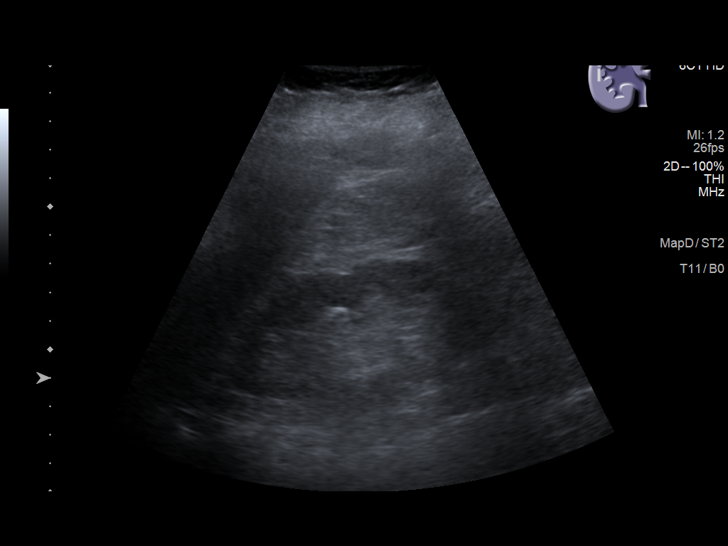
[im 12/48]
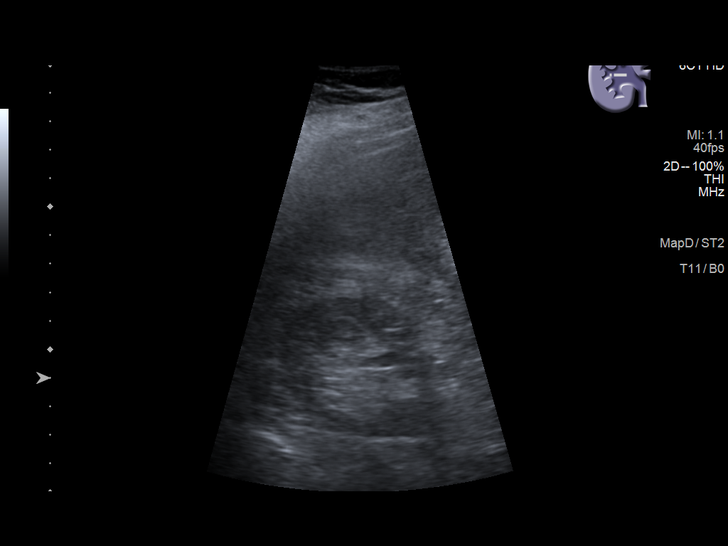
[im 16/48]
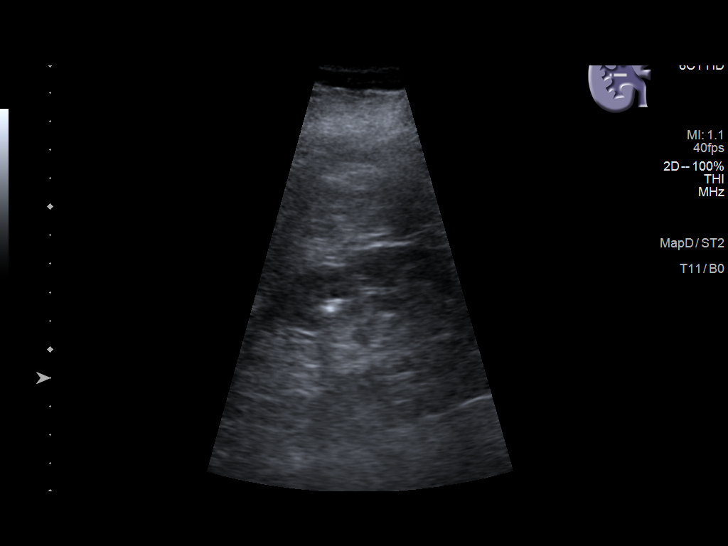
[im 18/48]
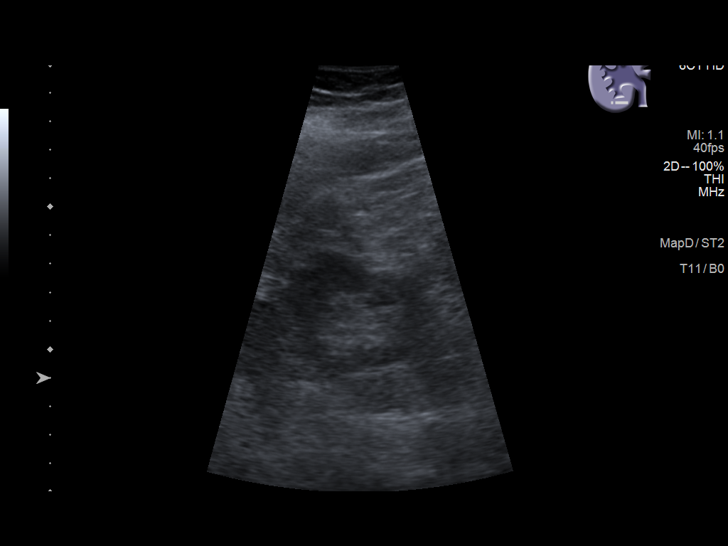
[im 22/48]
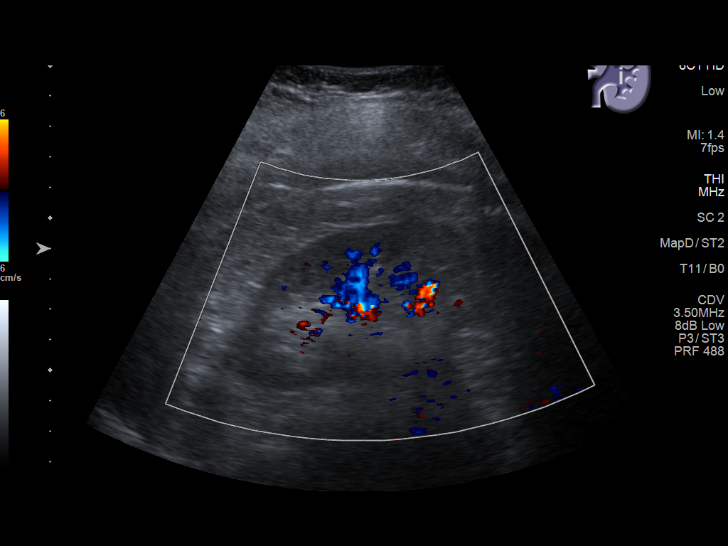
[im 26/48]
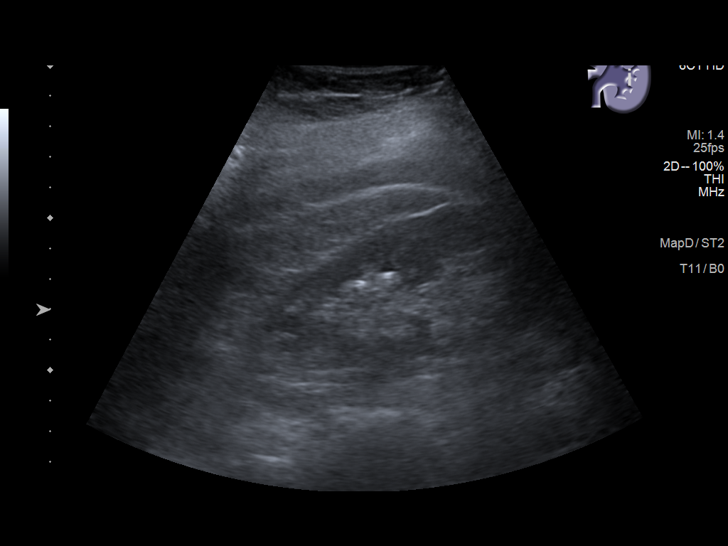
[im 30/48]
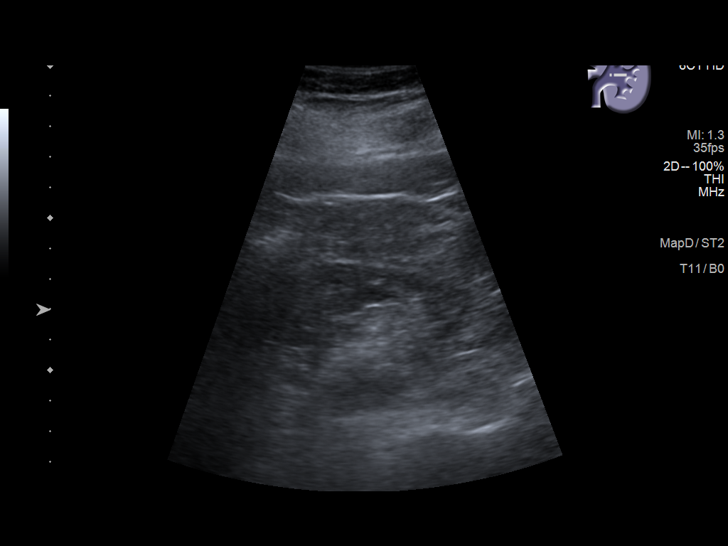
[im 32/48]
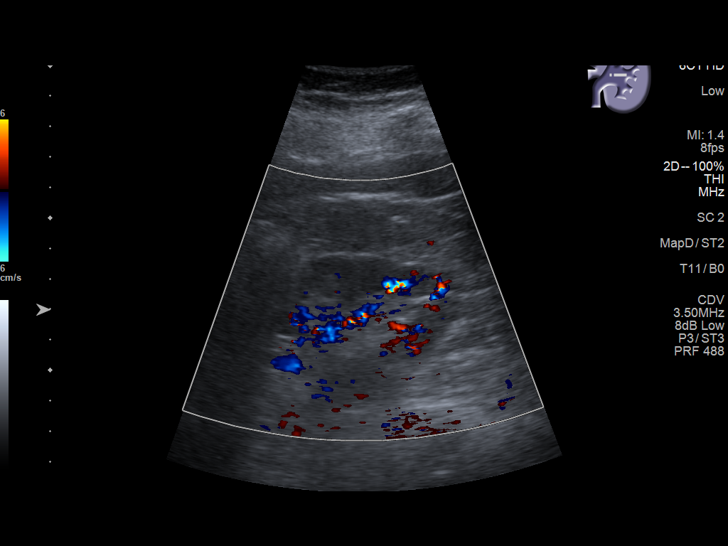
[im 36/48]
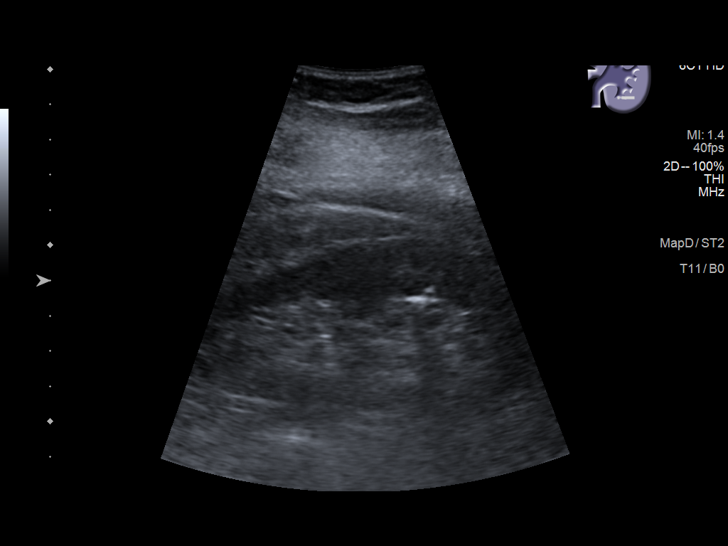
[im 40/48]
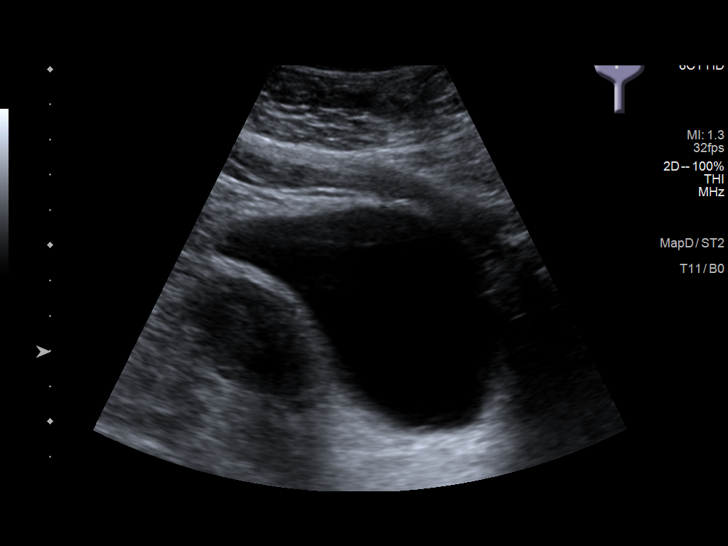
[im 44/48]
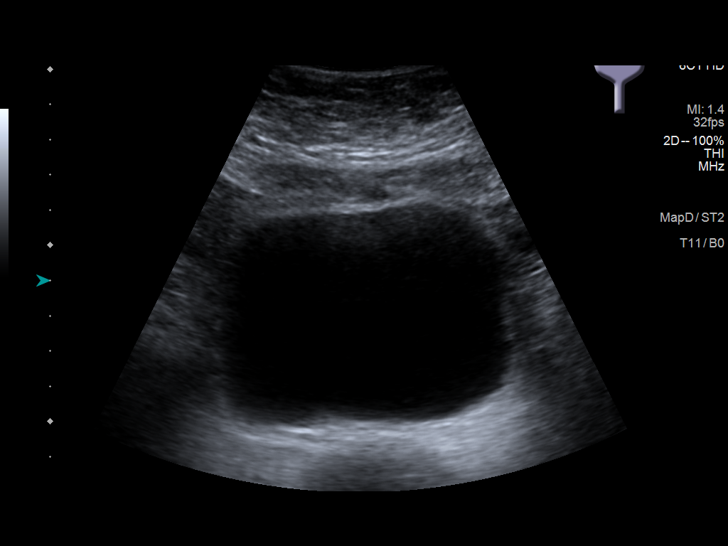
[im 48/48]
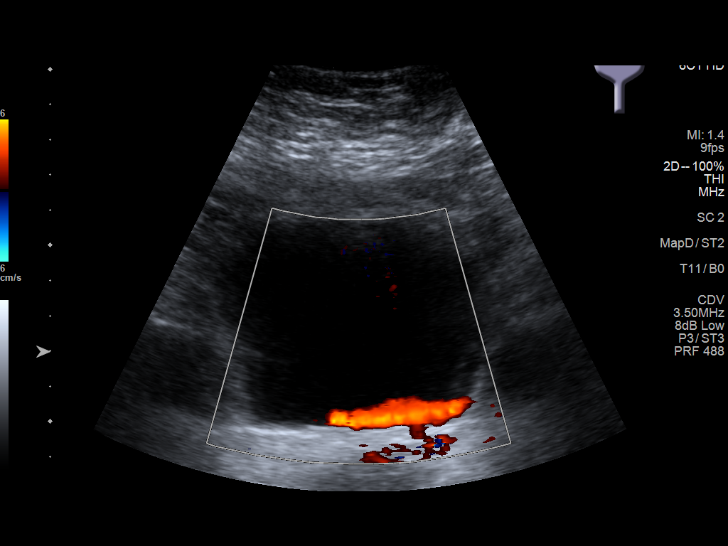

[14 of 25 positions shown; findings below may reference images not displayed]

FINDINGS: Right Kidney:

Renal measurements: 11.6 x 4.9 x 4.6 cm = volume: 137.3 mL. Renal
cortical echogenicity within normal limits. 5 mm echogenic focus
within the interpolar collecting system, likely reflecting a renal
calculus. No hydronephrosis.

Left Kidney:

Renal measurements: 10.7 x 4.8 x 6.0 cm = volume: 159.8 mL. Renal
cortical echogenicity within normal limits. Multiple echogenic foci
within the left renal collecting system, the largest within the
interpolar region measuring 5 mm, likely reflecting renal calculi.
No hydronephrosis.

Bladder:

Appears normal for degree of bladder distention. Bilateral ureteral
jets are demonstrated.
IMPRESSION: Echogenic foci within the bilateral renal collecting systems
measuring up to 5 mm, consistent with renal calculi.

No hydronephrosis.

Renal cortical echogenicity within normal limits.

## 2020-06-19 ENCOUNTER — Other Ambulatory Visit: Payer: Self-pay | Admitting: Family Medicine

## 2020-06-19 DIAGNOSIS — N201 Calculus of ureter: Secondary | ICD-10-CM

## 2020-06-26 ENCOUNTER — Ambulatory Visit: Payer: PPO | Admitting: Urology

## 2020-07-14 DIAGNOSIS — D485 Neoplasm of uncertain behavior of skin: Secondary | ICD-10-CM | POA: Diagnosis not present

## 2020-07-14 DIAGNOSIS — D2261 Melanocytic nevi of right upper limb, including shoulder: Secondary | ICD-10-CM | POA: Diagnosis not present

## 2020-07-14 DIAGNOSIS — D2262 Melanocytic nevi of left upper limb, including shoulder: Secondary | ICD-10-CM | POA: Diagnosis not present

## 2020-07-14 DIAGNOSIS — D225 Melanocytic nevi of trunk: Secondary | ICD-10-CM | POA: Diagnosis not present

## 2020-07-14 DIAGNOSIS — Z85828 Personal history of other malignant neoplasm of skin: Secondary | ICD-10-CM | POA: Diagnosis not present

## 2020-07-14 DIAGNOSIS — L821 Other seborrheic keratosis: Secondary | ICD-10-CM | POA: Diagnosis not present

## 2020-07-14 DIAGNOSIS — D2272 Melanocytic nevi of left lower limb, including hip: Secondary | ICD-10-CM | POA: Diagnosis not present

## 2020-08-21 DIAGNOSIS — H2513 Age-related nuclear cataract, bilateral: Secondary | ICD-10-CM | POA: Diagnosis not present

## 2020-08-26 ENCOUNTER — Other Ambulatory Visit: Payer: Self-pay

## 2020-08-26 ENCOUNTER — Telehealth (INDEPENDENT_AMBULATORY_CARE_PROVIDER_SITE_OTHER): Payer: PPO | Admitting: Psychiatry

## 2020-08-26 ENCOUNTER — Encounter: Payer: Self-pay | Admitting: Psychiatry

## 2020-08-26 DIAGNOSIS — G4701 Insomnia due to medical condition: Secondary | ICD-10-CM

## 2020-08-26 DIAGNOSIS — F319 Bipolar disorder, unspecified: Secondary | ICD-10-CM

## 2020-08-26 MED ORDER — BUPROPION HCL ER (XL) 300 MG PO TB24: 300.0000 mg | ORAL_TABLET | Freq: Every day | ORAL | 2 refills | Status: DC

## 2020-08-26 MED ORDER — LAMOTRIGINE 25 MG PO TABS: 25.0000 mg | ORAL_TABLET | Freq: Every day | ORAL | 1 refills | Status: DC

## 2020-08-26 NOTE — Progress Notes (Signed)
Provider Location : [facility] Patient Location : Home  Participants: Patient , Provider  Virtual Visit via Video Note  I connected with Julia Cooke on 08/26/20 at  4:00 PM EDT by a video enabled telemedicine application and verified that I am speaking with the correct person using two identifiers.   I discussed the limitations of evaluation and management by telemedicine and the availability of in person appointments. The patient expressed understanding and agreed to proceed.   I discussed the assessment and treatment plan with the patient. The patient was provided an opportunity to ask questions and all were answered. The patient agreed with the plan and demonstrated an understanding of the instructions.   The patient was advised to call back or seek an in-person evaluation if the symptoms worsen or if the condition fails to improve as anticipated.  [place] MD OP Progress Note  08/26/2020 4:21 PM Julia Cooke  MRN:  [id number]  Chief Complaint:  Chief Complaint    Follow-up     HPI: Julia Cooke is a 69 year old Caucasian female, married, on SSI, lives in [place], has a history of bipolar disorder, insomnia was evaluated by telemedicine today.  Patient reports that she stopped taking the Abilify as discussed.  She does have some irritability and anger issues on and off.  She also reports she does feel anxious on and off and has to make sure that she keep herself distracted and occupied.  She reports she is currently having some anxiety due to possibly contracting poison ivy.  She reports she is going to talk to her providers for treatment if it gets worse.  She reports sleep is overall okay on the trazodone as needed.  She reports she had all her labs done at her primary care office.  Reviewed and discussed the labs with her.  Patient denies any suicidality, homicidality or perceptual disturbances.  Discussed with patient about adding a new mood stabilizer like Lamictal now that  she is not on Abilify to see if that will help her irritability.  She agrees.  Patient denies any other concerns today.  Visit Diagnosis:    ICD-10-CM   1. Bipolar 1 disorder, depressed ([facility])  F31.9 lamoTRIgine (LAMICTAL) 25 MG tablet    buPROPion (WELLBUTRIN XL) 300 MG 24 hr tablet   mild  2. Insomnia due to medical condition  G47.01     Past Psychiatric History: I have reviewed past psychiatric history from my progress note on 05/02/2018  Past Medical History:  Past Medical History:  Diagnosis Date  . Bipolar disorder ([place])    takes Abilify  . Depression   . Dysrhythmia 10/2016   LBBB on ekg...nothing to compare it to.  Marland Kitchen Heart murmur   . Hypertension   . Nephrolithiasis 2000   s/p lithotripsy  . 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 EXTRACORPOREAL SHOCK WAVE LITHOTRIPSY Left 01/06/2017   Procedure: EXTRACORPOREAL SHOCK WAVE LITHOTRIPSY (ESWL);  Surgeon: Hollice Espy, MD;  Location: [facility] ORS;  Service: Urology;  Laterality: Left;  . LITHOTRIPSY  2000    Family Psychiatric History: Reviewed family psychiatric history from my progress note on 05/02/2018  Family History:  Family History  Problem Relation Age of Onset  . Hyperlipidemia Mother   . Stroke Mother   . Hypertension Mother   . Hyperlipidemia Father   . Hypertension Father   . Cancer Sister        breast  .  Breast cancer Sister 88  . Heart disease Brother        myocardial infarction  . Heart attack Brother   . Hypertension Brother   . Fibromyalgia Sister   . Hypertension Sister   . Heart Problems Sister   . Hypertension Sister   . Prostate cancer Neg Hx   . Kidney cancer Neg Hx   . Bladder Cancer Neg Hx     Social History: Reviewed social history from my progress note on 05/02/2018 Social History   Socioeconomic History  . Marital status: Married    Spouse name: richard  . Number of children: 3  . Years of education: Not on file  . Highest  education level: Some college, no degree  Occupational History    Comment: retired  Tobacco Use  . Smoking status: Never Smoker  . Smokeless tobacco: Never Used  Vaping Use  . Vaping Use: Never used  Substance and Sexual Activity  . Alcohol use: No    Alcohol/week: 0.0 - 2.0 standard drinks    Comment: 2-3 times weekly  . Drug use: No  . Sexual activity: Yes    Birth control/protection: Post-menopausal, None  Other Topics Concern  . Not on file  Social History Narrative   Patient lives at home with her husband of 84 years. They have 3 adult children (2 boys, 1 girl). They recently moved back to [place] in January. 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   Social Determinants of Health   Financial Resource Strain:   . Difficulty of Paying Living Expenses: Not on file  Food Insecurity:   . Worried About Charity fundraiser in the Last Year: Not on file  . Ran Out of Food in the Last Year: Not on file  Transportation Needs:   . Lack of Transportation (Medical): Not on file  . Lack of Transportation (Non-Medical): Not on file  Physical Activity:   . Days of Exercise per Week: Not on file  . Minutes of Exercise per Session: Not on file  Stress:   . Feeling of Stress : Not on file  Social Connections:   . Frequency of Communication with Friends and Family: Not on file  . Frequency of Social Gatherings with Friends and Family: Not on file  . Attends Religious Services: Not on file  . Active Member of Clubs or Organizations: Not on file  . Attends Archivist Meetings: Not on file  . Marital Status: Not on file    Allergies:  Allergies  Allergen Reactions  . Penicillins Swelling    As a child    Metabolic Disorder Labs: Lab Results  Component Value Date   HGBA1C 4.8 06/11/2020   MPG 94 01/20/2018   Lab Results  Component Value Date   PROLACTIN 3.2 (L) 01/20/2018   Lab Results   Component Value Date   CHOL 191 06/11/2020   TRIG 90.0 06/11/2020   HDL 43.90 06/11/2020   CHOLHDL 4 06/11/2020   VLDL 18.0 06/11/2020   LDLCALC 129 (H) 06/11/2020   LDLCALC 142 (H) 01/20/2018   Lab Results  Component Value Date   TSH 0.69 06/11/2020   TSH 0.82 10/02/2018    Therapeutic Level Labs: No results found for: LITHIUM No results found for: VALPROATE No components found for:  CBMZ  Current Medications: Current Outpatient Medications  Medication Sig Dispense Refill  . augmented betamethasone dipropionate (DIPROLENE-AF)  0.05 % cream     . buPROPion (WELLBUTRIN XL) 300 MG 24 hr tablet Take 1 tablet (300 mg total) by mouth daily with breakfast. 30 tablet 2  . hydrOXYzine (ATARAX/VISTARIL) 10 MG tablet Take 1-2 tablets (10-20 mg total) by mouth daily as needed for anxiety. Only for severe anxiety sx. 45 tablet 2  . ibuprofen (ADVIL,MOTRIN) 800 MG tablet     . lamoTRIgine (LAMICTAL) 25 MG tablet Take 1 tablet (25 mg total) by mouth daily. 30 tablet 1  . losartan (COZAAR) 50 MG tablet TAKE ONE TABLET BY MOUTH DAILY 90 tablet 3  . metoprolol succinate (TOPROL-XL) 25 MG 24 hr tablet TAKE ONE TABLET BY MOUTH DAILY 90 tablet 0  . tamsulosin (FLOMAX) 0.4 MG CAPS capsule TAKE ONE CAPSULE BY MOUTH DAILY 30 capsule 0  . traZODone (DESYREL) 50 MG tablet TAKE 0.5-1 TABLET BY MOUTH EVERY NIGHT AT BEDTIME AS NEEDED FOR SLEEP 30 tablet 2   No current facility-administered medications for this visit.     Musculoskeletal: Strength & Muscle Tone: UTA Gait & Station: normal Patient leans: N/A  Psychiatric Specialty Exam: Review of Systems  Skin:       RASH - Has poison ivy  Psychiatric/Behavioral: The patient is nervous/anxious.   All other systems reviewed and are negative.   There were no vitals taken for this visit.There is no height or weight on file to calculate BMI.  General Appearance: Casual  Eye Contact:  Fair  Speech:  Normal Rate  Volume:  Normal  Mood:  Anxious  and Irritable  Affect:  Congruent  Thought Process:  Goal Directed and Descriptions of Associations: Intact  Orientation:  Full (Time, Place, and Person)  Thought Content: Logical   Suicidal Thoughts:  No  Homicidal Thoughts:  No  Memory:  Immediate;   Fair Recent;   Fair Remote;   Fair  Judgement:  Fair  Insight:  Fair  Psychomotor Activity:  Normal  Concentration:  Concentration: Fair and Attention Span: Fair  Recall:  [employer] of Knowledge: Fair  Language: Fair  Akathisia:  No  Handed:  Right  AIMS (if indicated): UTA  Assets:  Communication Skills Desire for Improvement Housing Intimacy  ADL's:  Intact  Cognition: WNL  Sleep:  Fair   Screenings: PHQ2-9     Office Visit from 10/02/2018 in [facility] Office Visit from 12/23/2016 in [facility]  PHQ-2 Total Score 1 1  PHQ-9 Total Score 9 --       Assessment and Plan: Kao Berkheimer  Is a 69 year old Caucasian female who has a history of bipolar disorder was evaluated by telemedicine today.  Patient with psychosocial stressors of the pandemic, currently is off of the Abilify.  She does struggle with irritability and anger issues and will benefit from the following medication changes.  Plan as noted below.  Plan Bipolar disorder type I depressed-unstable Continue Wellbutrin XL 300 mg p.o. daily for now. Start Lamictal 25 mg p.o. daily to replace the Abilify.  Discussed with patient to not start the Lamictal until she is completely free of the poison ivy that she is currently struggling with. Provided education and discussed Katherina Right syndrome. Hydroxyzine 10 to 20 mg p.o. daily as needed for severe anxiety attacks  Insomnia-improving Trazodone 25 to 50 mg p.o. nightly as needed  Reviewed and discussed the following labs-lipid panel, hemoglobin A1c, prolactin with patient-dated 06/21/2020-within normal limits. She will talk to PMD about her elevated bilirubin  level.  Follow-up in clinic in 6 to 8 weeks or sooner if needed.  I have spent atleast 20 minutes face to face with patient today. More than 50 % of the time was spent for preparing to see the patient ( e.g., review of test, records ), ordering medications and test ,psychoeducation and supportive psychotherapy and care coordination,as well as documenting clinical information in electronic health record. This note was generated in part or whole with voice recognition software. Voice recognition is usually quite accurate but there are transcription errors that can and very often do occur. I apologize for any typographical errors that were not detected and corrected.       Ursula Alert, MD 08/27/2020, 8:24 AM

## 2020-08-26 NOTE — Patient Instructions (Signed)
Lamotrigine tablets What is this medicine? LAMOTRIGINE (la MOE tri jeen) is used to control seizures in adults and children with epilepsy and Lennox-Gastaut syndrome. It is also used in adults to treat bipolar disorder. This medicine may be used for other purposes; ask your health care provider or pharmacist if you have questions. COMMON BRAND NAME(S): Lamictal, Subvenite What should I tell my health care provider before I take this medicine? They need to know if you have any of these conditions:  aseptic meningitis during prior use of lamotrigine  depression  folate deficiency  kidney disease  liver disease  suicidal thoughts, plans, or attempt; a previous suicide attempt by you or a family member  an unusual or allergic reaction to lamotrigine or other seizure medications, other medicines, foods, dyes, or preservatives  pregnant or trying to get pregnant  breast-feeding How should I use this medicine? Take this medicine by mouth with a glass of water. Follow the directions on the prescription label. Do not chew these tablets. If this medicine upsets your stomach, take it with food or milk. Take your doses at regular intervals. Do not take your medicine more often than directed. A special MedGuide will be given to you by the pharmacist with each new prescription and refill. Be sure to read this information carefully each time. Talk to your pediatrician regarding the use of this medicine in children. While this drug may be prescribed for children as young as 2 years for selected conditions, precautions do apply. Overdosage: If you think you have taken too much of this medicine contact a poison control center or emergency room at once. NOTE: This medicine is only for you. Do not share this medicine with others. What if I miss a dose? If you miss a dose, take it as soon as you can. If it is almost time for your next dose, take only that dose. Do not take double or extra doses. What may  interact with this medicine?  atazanavir  carbamazepine  female hormones, including contraceptive or birth control pills  lopinavir  methotrexate  phenobarbital  phenytoin  primidone  pyrimethamine  rifampin  ritonavir  trimethoprim  valproic acid This list may not describe all possible interactions. Give your health care provider a list of all the medicines, herbs, non-prescription drugs, or dietary supplements you use. Also tell them if you smoke, drink alcohol, or use illegal drugs. Some items may interact with your medicine. What should I watch for while using this medicine? Visit your doctor or health care provider for regular checks on your progress. If you take this medicine for seizures, wear a Medic Alert bracelet or necklace. Carry an identification card with information about your condition, medicines, and doctor or health care provider. It is important to take this medicine exactly as directed. When first starting treatment, your dose will need to be adjusted slowly. It may take weeks or months before your dose is stable. You should contact your doctor or health care provider if your seizures get worse or if you have any new types of seizures. Do not stop taking this medicine unless instructed by your doctor or health care provider. Stopping your medicine suddenly can increase your seizures or their severity. This medicine may cause serious skin reactions. They can happen weeks to months after starting the medicine. Contact your health care provider right away if you notice fevers or flu-like symptoms with a rash. The rash may be red or purple and then turn into blisters or peeling   of the skin. Or, you might notice a red rash with swelling of the face, lips or lymph nodes in your neck or under your arms. You may get drowsy, dizzy, or have blurred vision. Do not drive, use machinery, or do anything that needs mental alertness until you know how this medicine affects you. To  reduce dizzy or fainting spells, do not sit or stand up quickly, especially if you are an older patient. Alcohol can increase drowsiness and dizziness. Avoid alcoholic drinks. If you are taking this medicine for bipolar disorder, it is important to report any changes in your mood to your doctor or health care provider. If your condition gets worse, you get mentally depressed, feel very hyperactive or manic, have difficulty sleeping, or have thoughts of hurting yourself or committing suicide, you need to get help from your health care provider right away. If you are a caregiver for someone taking this medicine for bipolar disorder, you should also report these behavioral changes right away. The use of this medicine may increase the chance of suicidal thoughts or actions. Pay special attention to how you are responding while on this medicine. Your mouth may get dry. Chewing sugarless gum or sucking hard candy, and drinking plenty of water may help. Contact your doctor if the problem does not go away or is severe. Women who become pregnant while using this medicine may enroll in the [place] Antiepileptic Drug Pregnancy Registry by calling 1-888-233-2334. This registry collects information about the safety of antiepileptic drug use during pregnancy. This medicine may cause a decrease in folic acid. You should make sure that you get enough folic acid while you are taking this medicine. Discuss the foods you eat and the vitamins you take with your health care provider. What side effects may I notice from receiving this medicine? Side effects that you should report to your doctor or health care professional as soon as possible:  allergic reactions like skin rash, itching or hives, swelling of the face, lips, or tongue  changes in vision  depressed mood  elevated mood, decreased need for sleep, racing thoughts, impulsive behavior  loss of balance or coordination  mouth sores  rash, fever, and  swollen lymph nodes  redness, blistering, peeling or loosening of the skin, including inside the mouth  right upper belly pain  seizures  severe muscle pain  signs and symptoms of aseptic meningitis such as stiff neck and sensitivity to light, headache, drowsiness, fever, nausea, vomiting, rash  signs of infection - fever or chills, cough, sore throat, pain or difficulty passing urine  suicidal thoughts or other mood changes  swollen lymph nodes  trouble walking  unusual bruising or bleeding  unusually weak or tired  yellowing of the eyes or skin Side effects that usually do not require medical attention (report to your doctor or health care professional if they continue or are bothersome):  diarrhea  dizziness  dry mouth  stuffy nose  tiredness  tremors  trouble sleeping This list may not describe all possible side effects. Call your doctor for medical advice about side effects. You may report side effects to FDA at 1-800-FDA-1088. Where should I keep my medicine? Keep out of reach of children. Store at room temperature between 15 and 30 degrees C (59 and 86 degrees F). Throw away any unused medicine after the expiration date. NOTE: This sheet is a summary. It may not cover all possible information. If you have questions about this medicine, talk to your doctor,   pharmacist, or health care provider.  2020 Elsevier/Gold Standard (2019-02-16 15:03:40)  

## 2020-08-29 DIAGNOSIS — L237 Allergic contact dermatitis due to plants, except food: Secondary | ICD-10-CM | POA: Diagnosis not present

## 2020-09-01 ENCOUNTER — Ambulatory Visit (INDEPENDENT_AMBULATORY_CARE_PROVIDER_SITE_OTHER): Payer: PPO | Admitting: Urology

## 2020-09-01 ENCOUNTER — Other Ambulatory Visit: Payer: Self-pay | Admitting: *Deleted

## 2020-09-01 ENCOUNTER — Other Ambulatory Visit: Payer: Self-pay

## 2020-09-01 ENCOUNTER — Encounter: Payer: Self-pay | Admitting: Urology

## 2020-09-01 ENCOUNTER — Other Ambulatory Visit
Admission: RE | Admit: 2020-09-01 | Discharge: 2020-09-01 | Disposition: A | Discharge status: Home / Self Care | Payer: PPO | Attending: Urology | Admitting: Urology

## 2020-09-01 VITALS — BP 107/71 | HR 82 | Ht 60.0 in | Wt 170.0 lb

## 2020-09-01 DIAGNOSIS — N952 Postmenopausal atrophic vaginitis: Secondary | ICD-10-CM

## 2020-09-01 DIAGNOSIS — N3281 Overactive bladder: Secondary | ICD-10-CM | POA: Diagnosis not present

## 2020-09-01 DIAGNOSIS — N3000 Acute cystitis without hematuria: Secondary | ICD-10-CM | POA: Diagnosis not present

## 2020-09-01 DIAGNOSIS — N361 Urethral diverticulum: Secondary | ICD-10-CM | POA: Diagnosis not present

## 2020-09-01 LAB — URINALYSIS, COMPLETE (UACMP) WITH MICROSCOPIC
Glucose, UA: NEGATIVE mg/dL
Hgb urine dipstick: NEGATIVE
Ketones, ur: NEGATIVE mg/dL
Nitrite: NEGATIVE
Protein, ur: NEGATIVE mg/dL
Specific Gravity, Urine: 1.02 (ref 1.005–1.030)
pH: 5.5 (ref 5.0–8.0)

## 2020-09-01 LAB — BLADDER SCAN AMB NON-IMAGING: Scan Result: 0

## 2020-09-01 MED ORDER — SULFAMETHOXAZOLE-TRIMETHOPRIM 800-160 MG PO TABS: 1.0000 | ORAL_TABLET | Freq: Two times a day (BID) | ORAL | 0 refills | Status: DC

## 2020-09-01 NOTE — Progress Notes (Signed)
09/01/2020 12:25 PM   Julia Cooke Julia Cooke 1951-10-14 [id number]  Referring provider: Crecencio Mc, MD Julia Cooke,  Julia Cooke [id number]  Chief Complaint  Patient presents with  . 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   She was last seen by Dr. Matilde Cooke in 11/2019 and she was return in one year for a repeated pelvic exam.    She has been having 2 weeks of needing to double void in order to feel like she is completely emptying her bladder.  She states that she started to develop suprapubic discomfort and a fever with nausea yesterday.  She did not take her temperature, so she is unknown how high her fever was.  She has not noted any burning with urination, but she has an achiness in the suprapubic area after urinating.  PVR is 0 mL.      PMH: Past Medical History:  Diagnosis Date  . Bipolar disorder ([place])    takes Abilify  . Depression   . Dysrhythmia 10/2016   LBBB on ekg...nothing to compare it to.  Marland Kitchen Heart murmur   . Hypertension   . Nephrolithiasis 2000   s/p lithotripsy  . Psychotic disorder ([place])    mania    Surgical History: Past Surgical History:  Procedure Laterality Date  . EXTRACORPOREAL SHOCK WAVE LITHOTRIPSY Left 01/06/2017   Procedure: EXTRACORPOREAL SHOCK WAVE LITHOTRIPSY (ESWL);  Surgeon: Julia Espy, MD;  Location: [facility] ORS;  Service: Urology;  Laterality: Left;  . LITHOTRIPSY  2000    Home Medications:  Allergies as of 09/01/2020      Reactions   Penicillins Swelling   As a child      Medication List       Accurate as of September 01, 2020 12:25 PM. If you have any questions, ask your nurse or doctor.        STOP taking these medications   tamsulosin 0.4 MG Caps capsule Commonly known as: FLOMAX Stopped by: Julia Council, PA-C     TAKE these medications   augmented betamethasone dipropionate 0.05 % cream Commonly known as: DIPROLENE-AF   buPROPion 300 MG 24 hr tablet Commonly known as: Wellbutrin XL Take 1 tablet (300 mg total) by mouth daily with breakfast.   hydrOXYzine 10 MG tablet Commonly known as: ATARAX/VISTARIL Take 1-2 tablets (10-20 mg total) by mouth daily as needed for anxiety. Only for severe anxiety sx.   ibuprofen 800 MG tablet Commonly known as: ADVIL   lamoTRIgine 25 MG tablet Commonly known as: LaMICtal Take 1 tablet (25 mg total) by mouth daily.   losartan 50 MG tablet Commonly known as: COZAAR TAKE ONE TABLET BY MOUTH DAILY   metoprolol succinate 25 MG 24 hr tablet Commonly known as: TOPROL-XL TAKE ONE TABLET BY MOUTH DAILY   sulfamethoxazole-trimethoprim 800-160 MG tablet Commonly known as: BACTRIM DS Take 1 tablet by mouth every 12 (twelve) hours. Started by: Julia Council, PA-C   traZODone 50 MG tablet Commonly known as: DESYREL TAKE 0.5-1 TABLET BY MOUTH EVERY NIGHT AT BEDTIME AS NEEDED FOR SLEEP       Allergies:  Allergies  Allergen Reactions  . Penicillins Swelling    As a child    Family History: Family History  Problem Relation Age of Onset  .  Hyperlipidemia Mother   . Stroke Mother   . Hypertension Mother   . Hyperlipidemia Father   . Hypertension Father   . Cancer Sister        breast  . Breast cancer Sister 51  . Heart disease Brother        myocardial infarction  . Heart attack Brother   . Hypertension Brother   . Fibromyalgia Sister   . Hypertension Sister   . Heart Problems Sister   . Hypertension Sister   . Prostate cancer Neg Hx   . Kidney cancer Neg Hx   . Bladder Cancer Neg Hx     Social History:  reports that she has never smoked. She has never used smokeless tobacco. She reports that she does not drink alcohol and does not use drugs.  ROS: Pertinent ROS in HPI  Physical Exam: BP 107/71   Pulse 82   Ht 5'  (1.524 m)   Wt 170 lb (77.1 kg)   BMI 33.20 kg/m   Constitutional:  Well nourished. Alert and oriented, No acute distress. HEENT: [place] AT, mask in place.  Trachea midline Cardiovascular: No clubbing, cyanosis, or edema. Respiratory: Normal respiratory effort, no increased work of breathing. GU: No CVA tenderness.  No bladder fullness or masses.  Atrophic external genitalia, sparse pubic hair distribution, no lesions.  Normal urethral meatus, no lesions, no prolapse, no discharge.   No urethral masses, tenderness and/or tenderness. No bladder fullness, tenderness or masses. Pale vagina mucosa, poor estrogen effect, no discharge, no lesions, fair pelvic support, grade I cystocele and grade I rectocele noted.  A tender cystic area is noted anteriorly on the left side of the vaginal vault in the area of the known urethral diverticulum.  No purulent drainage was seen coming from the meatus with pressing of the cyst.  Anus and perineum are without rashes or lesions.    Neurologic: Grossly intact, no focal deficits, moving all 4 extremities. Psychiatric: Normal mood and affect.   Laboratory Data: Lab Results  Component Value Date   WBC 8.3 06/11/2020   HGB 14.3 06/11/2020   HCT 41.0 06/11/2020   MCV 90.3 06/11/2020   PLT 213.0 06/11/2020    Lab Results  Component Value Date   CREATININE 0.84 06/11/2020    Lab Results  Component Value Date   HGBA1C 4.8 06/11/2020    Lab Results  Component Value Date   TSH 0.69 06/11/2020       Component Value Date/Time   CHOL 191 06/11/2020 0930   HDL 43.90 06/11/2020 0930   CHOLHDL 4 06/11/2020 0930   VLDL 18.0 06/11/2020 0930   LDLCALC 129 (H) 06/11/2020 0930    Lab Results  Component Value Date   AST 16 06/11/2020   Lab Results  Component Value Date   ALT 15 06/11/2020    Urinalysis Component     Latest Ref Rng & Units 09/01/2020          Color, Urine     YELLOW YELLOW  Appearance     CLEAR HAZY (A)  Specific Gravity, Urine      1.005 - 1.030 1.020  pH     5.0 - 8.0 5.5  Glucose, UA     NEGATIVE mg/dL NEGATIVE  Hgb urine dipstick     NEGATIVE NEGATIVE  Bilirubin Urine     NEGATIVE SMALL (A)  Ketones, ur     NEGATIVE mg/dL NEGATIVE  Protein     NEGATIVE mg/dL NEGATIVE  Nitrite  NEGATIVE NEGATIVE  Leukocytes,Ua     NEGATIVE TRACE (A)  Squamous Epithelial / LPF     0 - 5 0-5  WBC, UA     0 - 5 WBC/hpf 21-50  RBC / HPF     0 - 5 RBC/hpf 0-5  Bacteria, UA     NONE SEEN FEW (A)  WBC Clumps      PRESENT  Mucus      PRESENT   I have reviewed the labs.   Pertinent Imaging: Results for YONG, WAHLQUIST (MRN [id number]) as of 09/01/2020 12:56  Ref. Range 09/01/2020 11:55  Scan Result Unknown 0 ml    Assessment & Plan:    1. UTI  - UA grossly infected - urine sent for culture - Started on Septra DS empirically - will adjust if necessary once sensitivities are available   2. Vaginal atrophy - start the vaginal estrogen cream three nights weekly - return in three weeks for exam   3. Urethral diverticulum - return in three weeks for follow up exam  Return in about 3 weeks (around 09/22/2020) for exam .  These notes generated with voice recognition software. I apologize for typographical errors.  Julia Council, PA-C  [facility] ([facility] [address]  [place], [place] [address] [phone]

## 2020-09-01 NOTE — Telephone Encounter (Signed)
Tried calling pt to schedule appt today in [place], pt would need ua/ ucx.   Marland Kitchenleft message to have patient return my call.

## 2020-09-02 LAB — URINE CULTURE: Culture: NO GROWTH

## 2020-09-03 ENCOUNTER — Telehealth: Payer: Self-pay | Admitting: Family Medicine

## 2020-09-03 NOTE — Telephone Encounter (Signed)
LMOM for patient to return call.

## 2020-09-03 NOTE — Telephone Encounter (Signed)
Notified patient as instructed, patient pleased °

## 2020-09-03 NOTE — Telephone Encounter (Signed)
-----   Message from Nori Riis, PA-C sent at 09/03/2020  9:37 AM EDT ----- Please let Julia Cooke know that her urine culture was negative for infection, so she may discontinue her antibiotic.  If she is still having symptoms, I would suggest she have an MRI of her pelvis for further evaluation

## 2020-09-12 ENCOUNTER — Other Ambulatory Visit: Payer: Self-pay | Admitting: Internal Medicine

## 2020-09-12 MED ORDER — LOSARTAN POTASSIUM 50 MG PO TABS: 50.0000 mg | ORAL_TABLET | Freq: Every day | ORAL | 0 refills | Status: DC

## 2020-09-12 MED ORDER — METOPROLOL SUCCINATE ER 25 MG PO TB24: 25.0000 mg | ORAL_TABLET | Freq: Every day | ORAL | 0 refills | Status: DC

## 2020-09-12 NOTE — Addendum Note (Signed)
Addended by: Adair Laundry on: 09/12/2020 01:27 PM   Modules accepted: Orders

## 2020-09-23 ENCOUNTER — Ambulatory Visit: Payer: PPO | Admitting: Urology

## 2020-10-03 ENCOUNTER — Telehealth: Payer: Self-pay

## 2020-10-03 NOTE — Telephone Encounter (Signed)
LMTCB. Need to schedule pt for a follow up with Dr. Tullo. 

## 2020-10-22 ENCOUNTER — Other Ambulatory Visit: Payer: Self-pay | Admitting: Psychiatry

## 2020-10-22 DIAGNOSIS — F319 Bipolar disorder, unspecified: Secondary | ICD-10-CM

## 2020-10-31 ENCOUNTER — Ambulatory Visit: Payer: PPO | Admitting: Internal Medicine

## 2020-11-03 ENCOUNTER — Encounter: Payer: Self-pay | Admitting: Psychiatry

## 2020-11-03 ENCOUNTER — Other Ambulatory Visit: Payer: Self-pay

## 2020-11-03 ENCOUNTER — Telehealth (INDEPENDENT_AMBULATORY_CARE_PROVIDER_SITE_OTHER): Payer: PPO | Admitting: Psychiatry

## 2020-11-03 DIAGNOSIS — F3176 Bipolar disorder, in full remission, most recent episode depressed: Secondary | ICD-10-CM | POA: Diagnosis not present

## 2020-11-03 DIAGNOSIS — G4701 Insomnia due to medical condition: Secondary | ICD-10-CM

## 2020-11-03 MED ORDER — TRAZODONE HCL 50 MG PO TABS: ORAL_TABLET | ORAL | 2 refills | Status: DC

## 2020-11-03 NOTE — Progress Notes (Signed)
Virtual Visit via Telephone Note  I connected with Julia Cooke on 11/03/20 at  4:00 PM EST by telephone and verified that I am speaking with the correct person using two identifiers.  Location Provider Location : [facility] Patient Location : Home  Participants: Patient , Provider   I discussed the limitations, risks, security and privacy concerns of performing an evaluation and management service by telephone and the availability of in person appointments. I also discussed with the patient that there may be a patient responsible charge related to this service. The patient expressed understanding and agreed to proceed.     I discussed the assessment and treatment plan with the patient. The patient was provided an opportunity to ask questions and all were answered. The patient agreed with the plan and demonstrated an understanding of the instructions.   The patient was advised to call back or seek an in-person evaluation if the symptoms worsen or if the condition fails to improve as anticipated.  [facility] MD OP Progress Note  11/03/2020 4:22 PM [name]  MRN:  [id number]  Chief Complaint:  Chief Complaint    Follow-up     HPI: Julia Cooke is a 69 year old Caucasian female, married, on SSI, lives in [place], has a history of bipolar disorder, insomnia was evaluated by telemedicine today.  Patient today reports she had a good Thanksgiving holiday.  She reports her husband has started working again and they have been on the road together a lot since he has to travel for work.  She reports mood wise she is doing okay.  Denies any anger or irritability since being on the Lamictal.  She denies side effects to the Lamictal.  She reports sleep was restless only because her husband snores a lot at night.  She reports she is currently working on sleep hygiene techniques.  She also has trazodone available.  Patient denies any suicidality, homicidality or perceptual disturbances.  She does  report leg pain, left side.  She reports she has upcoming appointment scheduled with her provider to be evaluated.  This has been going on for a long time.  Patient denies any other concerns today.  Visit Diagnosis:    ICD-10-CM   1. Bipolar disorder, in full remission, most recent episode depressed ([place])  F31.76 traZODone (DESYREL) 50 MG tablet  2. Insomnia due to medical condition  G47.01     Past Psychiatric History: I have reviewed past psychiatric history from my progress note on 05/02/2018  Past Medical History:  Past Medical History:  Diagnosis Date  . Bipolar disorder ([place])    takes Abilify  . Depression   . Dysrhythmia 10/2016   LBBB on ekg...nothing to compare it to.  Marland Kitchen Heart murmur   . Hypertension   . Nephrolithiasis 2000   s/p lithotripsy  . 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 EXTRACORPOREAL SHOCK WAVE LITHOTRIPSY Left 01/06/2017   Procedure: EXTRACORPOREAL SHOCK WAVE LITHOTRIPSY (ESWL);  Surgeon: Hollice Espy, MD;  Location: [facility] ORS;  Service: Urology;  Laterality: Left;  . LITHOTRIPSY  2000    Family Psychiatric History: Reviewed family psychiatric history from my progress note on 05/02/2018  Family History:  Family History  Problem Relation Age of Onset  . Hyperlipidemia Mother   . Stroke Mother   . Hypertension Mother   . Hyperlipidemia Father   . Hypertension Father   . Cancer Sister  breast  . Breast cancer Sister 60  . Heart disease Brother        myocardial infarction  . Heart attack Brother   . Hypertension Brother   . Fibromyalgia Sister   . Hypertension Sister   . Heart Problems Sister   . Hypertension Sister   . Prostate cancer Neg Hx   . Kidney cancer Neg Hx   . Bladder Cancer Neg Hx     Social History: Reviewed social history from my progress note on 05/02/2018 Social History   Socioeconomic History  . Marital status: Married    Spouse name: richard  . Number of  children: 3  . Years of education: Not on file  . Highest education level: Some college, no degree  Occupational History    Comment: retired  Tobacco Use  . Smoking status: Never Smoker  . Smokeless tobacco: Never Used  Vaping Use  . Vaping Use: Never used  Substance and Sexual Activity  . Alcohol use: No    Alcohol/week: 0.0 - 2.0 standard drinks    Comment: 2-3 times weekly  . Drug use: No  . Sexual activity: Yes    Birth control/protection: Post-menopausal, None  Other Topics Concern  . Not on file  Social History Narrative   Patient lives at home with her husband of 46 years. They have 3 adult children (2 boys, 1 girl). They recently moved back to [place] in January. 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   Social Determinants of Health   Financial Resource Strain:   . Difficulty of Paying Living Expenses: Not on file  Food Insecurity:   . Worried About Charity fundraiser in the Last Year: Not on file  . Ran Out of Food in the Last Year: Not on file  Transportation Needs:   . Lack of Transportation (Medical): Not on file  . Lack of Transportation (Non-Medical): Not on file  Physical Activity:   . Days of Exercise per Week: Not on file  . Minutes of Exercise per Session: Not on file  Stress:   . Feeling of Stress : Not on file  Social Connections:   . Frequency of Communication with Friends and Family: Not on file  . Frequency of Social Gatherings with Friends and Family: Not on file  . Attends Religious Services: Not on file  . Active Member of Clubs or Organizations: Not on file  . Attends Archivist Meetings: Not on file  . Marital Status: Not on file    Allergies:  Allergies  Allergen Reactions  . Penicillins Swelling    As a child    Metabolic Disorder Labs: Lab Results  Component Value Date   HGBA1C 4.8 06/11/2020   MPG 94 01/20/2018   Lab Results  Component Value  Date   PROLACTIN 3.2 (L) 01/20/2018   Lab Results  Component Value Date   CHOL 191 06/11/2020   TRIG 90.0 06/11/2020   HDL 43.90 06/11/2020   CHOLHDL 4 06/11/2020   VLDL 18.0 06/11/2020   LDLCALC 129 (H) 06/11/2020   LDLCALC 142 (H) 01/20/2018   Lab Results  Component Value Date   TSH 0.69 06/11/2020   TSH 0.82 10/02/2018    Therapeutic Level Labs: No results found for: LITHIUM No results found for: VALPROATE No components found for:  CBMZ  Current Medications: Current Outpatient Medications  Medication Sig Dispense Refill  . augmented  betamethasone dipropionate (DIPROLENE-AF) 0.05 % cream     . buPROPion (WELLBUTRIN XL) 300 MG 24 hr tablet Take 1 tablet (300 mg total) by mouth daily with breakfast. 30 tablet 2  . hydrOXYzine (ATARAX/VISTARIL) 10 MG tablet Take 1-2 tablets (10-20 mg total) by mouth daily as needed for anxiety. Only for severe anxiety sx. 45 tablet 2  . ibuprofen (ADVIL,MOTRIN) 800 MG tablet     . lamoTRIgine (LAMICTAL) 25 MG tablet TAKE ONE TABLET BY MOUTH DAILY 30 tablet 1  . losartan (COZAAR) 50 MG tablet Take 1 tablet (50 mg total) by mouth daily. 90 tablet 0  . metoprolol succinate (TOPROL-XL) 25 MG 24 hr tablet Take 1 tablet (25 mg total) by mouth daily. 90 tablet 0  . sulfamethoxazole-trimethoprim (BACTRIM DS) 800-160 MG tablet Take 1 tablet by mouth every 12 (twelve) hours. 14 tablet 0  . traZODone (DESYREL) 50 MG tablet TAKE 0.5-1 TABLET BY MOUTH EVERY NIGHT AT BEDTIME AS NEEDED FOR SLEEP 30 tablet 2  . triamcinolone ointment (KENALOG) 0.1 % Apply topically.     No current facility-administered medications for this visit.     Musculoskeletal: Strength & Muscle Tone: UTA Gait & Station: UTA Patient leans: N/A  Psychiatric Specialty Exam: Review of Systems  Musculoskeletal:       Left sided leg pain  Psychiatric/Behavioral: Positive for sleep disturbance (Restless). Negative for agitation, behavioral problems, confusion, decreased  concentration, dysphoric mood, hallucinations, self-injury and suicidal ideas. The patient is not nervous/anxious and is not hyperactive.   All other systems reviewed and are negative.   There were no vitals taken for this visit.There is no height or weight on file to calculate BMI.  General Appearance: UTA  Eye Contact:  UTA  Speech:  Clear and Coherent  Volume:  Normal  Mood:  Euthymic  Affect:  UTA  Thought Process:  Goal Directed and Descriptions of Associations: Intact  Orientation:  Full (Time, Place, and Person)  Thought Content: Logical   Suicidal Thoughts:  No  Homicidal Thoughts:  No  Memory:  Immediate;   Fair Recent;   Fair Remote;   Fair  Judgement:  Fair  Insight:  Fair  Psychomotor Activity:  UTA  Concentration:  Concentration: Fair and Attention Span: Fair  Recall:  [employer] of Knowledge: Fair  Language: Fair  Akathisia:  No  Handed:  Right  AIMS (if indicated): UTA  Assets:  Communication Skills Desire for Improvement Housing Social Support  ADL's:  Intact  Cognition: WNL  Sleep:  Restless- husband's snoring   Screenings: PHQ2-9     Office Visit from 10/02/2018 in [facility] Visit from 12/23/2016 in [place]  PHQ-2 Total Score 1 1  PHQ-9 Total Score 9 --       Assessment and Plan: Hasna Stefanik is a 70 year old Caucasian female who has a history of bipolar disorder was evaluated by telemedicine today.  Patient with psychosocial stressors of the pandemic, currently is off of the Abilify.  She however is currently stable on the Lamictal, tolerating it well.  Plan as noted below.  Plan Bipolar disorder type I in remission Wellbutrin XL 300 mg p.o. daily. Continue Lamictal 25 mg p.o. daily Hydroxyzine 10 to 20 mg p.o. daily as needed for severe anxiety attacks   Insomnia-improving Trazodone 25 to 50 mg p.o. nightly Discussed to continue sleep hygiene techniques.  Advised to get  earplugs.  Follow-up in clinic in 2 months or sooner if needed.  I have spent atleast 20 minutes non face to face by video with patient today. More than 50 % of the time was spent for preparing to see the patient ( e.g., review of test, records , ordering medications and test ,psychoeducation and supportive psychotherapy and care coordination,as well as documenting clinical information in electronic health record. This note was generated in part or whole with voice recognition software. Voice recognition is usually quite accurate but there are transcription errors that can and very often do occur. I apologize for any typographical errors that were not detected and corrected.      Ursula Alert, MD 11/04/2020, 8:42 AM

## 2020-11-18 ENCOUNTER — Telehealth: Payer: Self-pay | Admitting: Internal Medicine

## 2020-11-18 DIAGNOSIS — Z20822 Contact with and (suspected) exposure to covid-19: Secondary | ICD-10-CM | POA: Diagnosis not present

## 2020-11-18 NOTE — Telephone Encounter (Signed)
Pt called wanting an in office appt.. Pt said that she was around her granddaughter this weekend and she has strep. Pt said that she would go to an UC

## 2020-11-18 NOTE — Telephone Encounter (Signed)
noted 

## 2020-11-21 DIAGNOSIS — Z20822 Contact with and (suspected) exposure to covid-19: Secondary | ICD-10-CM | POA: Diagnosis not present

## 2020-11-21 DIAGNOSIS — U071 COVID-19: Secondary | ICD-10-CM | POA: Diagnosis not present

## 2020-11-21 DIAGNOSIS — Z03818 Encounter for observation for suspected exposure to other biological agents ruled out: Secondary | ICD-10-CM | POA: Diagnosis not present

## 2020-12-22 ENCOUNTER — Encounter: Payer: Self-pay | Admitting: Urology

## 2020-12-22 ENCOUNTER — Ambulatory Visit: Payer: PPO | Admitting: Urology

## 2020-12-22 ENCOUNTER — Other Ambulatory Visit: Payer: Self-pay

## 2020-12-22 VITALS — BP 147/84 | HR 81 | Ht 61.0 in | Wt 169.0 lb

## 2020-12-22 DIAGNOSIS — N361 Urethral diverticulum: Secondary | ICD-10-CM | POA: Diagnosis not present

## 2020-12-22 NOTE — Progress Notes (Signed)
12/22/2020 1:25 PM   Julia Cooke 18-Feb-1951 [id number]  Referring provider: Crecencio Mc, MD [address],  [place] [address]  Chief Complaint  Patient presents with  . Follow-up    HPI: Patient in clinic with kidney stones.  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  Patient is asymptomatic does not get bladder infections.  Voids every 2-3 hours gets up once a night.  Rarely leaks with cough and wears no pads.  No bulging sensation or burning or postvoid dribbling  On pelvic examination she had a small elevation closer to the distal urethra and actually more in the right side with minimal on the left.  I felt this was normal elevation of the tissue.  I could not see any elevation or cystic mass mid urethra on the left side.  Picture was drawn.  Role of MRI discussed.  Natural history discussed.  Based on review of the CT scan the diverticulum is lateral just reaching the inferior edge of the urethra.  I am not surprised that there is no cystic mass palpable and that is otherwise asymptomatic.  I think it safest to get his MRI and watchful waiting is a good option.   The stone and cancer discussed.  Watchful waiting discussed.  We decided not to get an MRI since it would not change management.  Repeat pelvic examination in 1 year  Today Frequency stable.  Approximately 1 year ago when she saw the nurse practitioner a few weeks after my visit she had nausea and some discomfort.  No infections in the last year.  Has a little bit of stopping and starting but flow was reasonable and she feels empty.  Repeat pelvic examination unchanged.  She is a little bit of a swelling just distal to the right of the urethral meatus.  No elevation at all mid urethra on left side.   PMH: Past Medical History:  Diagnosis Date  . Bipolar disorder ([place])    takes Abilify  . Depression   . Dysrhythmia 10/2016   LBBB on ekg...nothing to compare it to.  Marland Kitchen  Heart murmur   . Hypertension   . Nephrolithiasis 2000   s/p lithotripsy  . 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 EXTRACORPOREAL SHOCK WAVE LITHOTRIPSY Left 01/06/2017   Procedure: EXTRACORPOREAL SHOCK WAVE LITHOTRIPSY (ESWL);  Surgeon: Hollice Espy, MD;  Location: [facility] ORS;  Service: Urology;  Laterality: Left;  . LITHOTRIPSY  2000    Home Medications:  Allergies as of 12/22/2020      Reactions   Penicillins Swelling   As a child      Medication List       Accurate as of December 22, 2020  1:25 PM. If you have any questions, ask your nurse or doctor.        augmented betamethasone dipropionate 0.05 % cream Commonly known as: DIPROLENE-AF   buPROPion 300 MG 24 hr tablet Commonly known as: Wellbutrin XL Take 1 tablet (300 mg total) by mouth daily with breakfast.   hydrOXYzine 10 MG tablet Commonly known as: ATARAX/VISTARIL Take 1-2 tablets (10-20 mg total) by mouth daily as needed for anxiety. Only for severe anxiety sx.   ibuprofen 800 MG tablet Commonly known as: ADVIL   lamoTRIgine 25 MG tablet Commonly known as: LAMICTAL TAKE ONE TABLET BY MOUTH DAILY   losartan 50 MG tablet Commonly known  as: COZAAR Take 1 tablet (50 mg total) by mouth daily.   metoprolol succinate 25 MG 24 hr tablet Commonly known as: TOPROL-XL Take 1 tablet (25 mg total) by mouth daily.   sulfamethoxazole-trimethoprim 800-160 MG tablet Commonly known as: BACTRIM DS Take 1 tablet by mouth every 12 (twelve) hours.   traZODone 50 MG tablet Commonly known as: DESYREL TAKE 0.5-1 TABLET BY MOUTH EVERY NIGHT AT BEDTIME AS NEEDED FOR SLEEP   triamcinolone ointment 0.1 % Commonly known as: KENALOG Apply topically.       Allergies:  Allergies  Allergen Reactions  . Penicillins Swelling    As a child    Family History: Family History  Problem Relation Age of Onset  . Hyperlipidemia Mother   . Stroke Mother    . Hypertension Mother   . Hyperlipidemia Father   . Hypertension Father   . Cancer Sister        breast  . Breast cancer Sister 48  . Heart disease Brother        myocardial infarction  . Heart attack Brother   . Hypertension Brother   . Fibromyalgia Sister   . Hypertension Sister   . Heart Problems Sister   . Hypertension Sister   . Prostate cancer Neg Hx   . Kidney cancer Neg Hx   . Bladder Cancer Neg Hx     Social History:  reports that she has never smoked. She has never used smokeless tobacco. She reports that she does not drink alcohol and does not use drugs.  ROS:                                        Physical Exam: BP (!) 147/84   Pulse 81   Ht 5\' 1"  (1.549 m)   Wt 76.7 kg   BMI 31.93 kg/m   Constitutional:  Alert and oriented, No acute distress.  Laboratory Data: Lab Results  Component Value Date   WBC 8.3 06/11/2020   HGB 14.3 06/11/2020   HCT 41.0 06/11/2020   MCV 90.3 06/11/2020   PLT 213.0 06/11/2020    Lab Results  Component Value Date   CREATININE 0.84 06/11/2020    No results found for: PSA  No results found for: TESTOSTERONE  Lab Results  Component Value Date   HGBA1C 4.8 06/11/2020    Urinalysis    Component Value Date/Time   COLORURINE YELLOW 09/01/2020 1132   APPEARANCEUR HAZY (A) 09/01/2020 1132   APPEARANCEUR Clear 12/17/2019 1551   LABSPEC 1.020 09/01/2020 1132   LABSPEC 1.003 02/02/2013 1331   PHURINE 5.5 09/01/2020 1132   GLUCOSEU NEGATIVE 09/01/2020 1132   GLUCOSEU NEGATIVE 11/18/2014 1135   HGBUR NEGATIVE 09/01/2020 1132   BILIRUBINUR SMALL (A) 09/01/2020 1132   BILIRUBINUR Negative 12/17/2019 1551   BILIRUBINUR Negative 02/02/2013 1331   KETONESUR NEGATIVE 09/01/2020 1132   PROTEINUR NEGATIVE 09/01/2020 1132   UROBILINOGEN 0.2 11/18/2014 1135   UROBILINOGEN 0.2 11/18/2014 1134   NITRITE NEGATIVE 09/01/2020 1132   LEUKOCYTESUR TRACE (A) 09/01/2020 1132   LEUKOCYTESUR 1+ 02/02/2013 1331     Pertinent Imaging:   Assessment & Plan: Reassurance given and I will see the patient as needed   There are no diagnoses linked to this encounter.  No follow-ups on file.  Reece Packer, MD  [facility] [address], [place], [place] [address] [phone]

## 2020-12-23 ENCOUNTER — Encounter: Payer: Self-pay | Admitting: Psychiatry

## 2020-12-23 ENCOUNTER — Telehealth (INDEPENDENT_AMBULATORY_CARE_PROVIDER_SITE_OTHER): Payer: PPO | Admitting: Psychiatry

## 2020-12-23 DIAGNOSIS — G4701 Insomnia due to medical condition: Secondary | ICD-10-CM

## 2020-12-23 DIAGNOSIS — F3176 Bipolar disorder, in full remission, most recent episode depressed: Secondary | ICD-10-CM | POA: Diagnosis not present

## 2020-12-23 MED ORDER — LAMOTRIGINE 25 MG PO TABS: 25.0000 mg | ORAL_TABLET | Freq: Every day | ORAL | 2 refills | Status: DC

## 2020-12-23 MED ORDER — BUPROPION HCL ER (XL) 300 MG PO TB24: 300.0000 mg | ORAL_TABLET | Freq: Every day | ORAL | 2 refills | Status: DC

## 2020-12-23 NOTE — Progress Notes (Signed)
Virtual Visit via Telephone Note  I connected with Julia Cooke on 12/23/20 at  4:00 PM EST by telephone and verified that I am speaking with the correct person using two identifiers.  Location Provider Location : [facility] Patient Location : Home  Participants: Patient ,Spouse, Provider   I discussed the limitations, risks, security and privacy concerns of performing an evaluation and management service by telephone and the availability of in person appointments. I also discussed with the patient that there may be a patient responsible charge related to this service. The patient expressed understanding and agreed to proceed.    I discussed the assessment and treatment plan with the patient. The patient was provided an opportunity to ask questions and all were answered. The patient agreed with the plan and demonstrated an understanding of the instructions.   The patient was advised to call back or seek an in-person evaluation if the symptoms worsen or if the condition fails to improve as anticipated.   Isabella MD OP Progress Note  12/23/2020 4:18 PM Julia Cooke  MRN:  [id number]  Chief Complaint:  Chief Complaint    Follow-up     HPI: Julia Cooke is a 70 year old Caucasian female, married, on SSI, lives in [place], has a history of bipolar disorder, insomnia was evaluated by telemedicine today.  Patient today reports she and her family members were infected with COVID-19 during the holidays.  She reports she is currently doing well.  She has recovered completely from it.  Patient denies any mood swings.  Reports the lamotrigine and Wellbutrin is effective.  Denies side effects.  She reports sleep is good.  Patient denies any suicidality, homicidality or perceptual disturbances.  Patient denies any other concerns today.  Visit Diagnosis:    ICD-10-CM   1. Bipolar disorder, in full remission, most recent episode depressed (HCC)  F31.76 lamoTRIgine (LAMICTAL) 25 MG tablet     buPROPion (WELLBUTRIN XL) 300 MG 24 hr tablet  2. Insomnia due to medical condition  G47.01     Past Psychiatric History: I have reviewed past psychiatric history from my progress note on 05/02/2018  Past Medical History:  Past Medical History:  Diagnosis Date  . Bipolar disorder ([place])    takes Abilify  . Depression   . Dysrhythmia 10/2016   LBBB on ekg...nothing to compare it to.  Marland Kitchen Heart murmur   . Hypertension   . Nephrolithiasis 2000   s/p lithotripsy  . 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 EXTRACORPOREAL SHOCK WAVE LITHOTRIPSY Left 01/06/2017   Procedure: EXTRACORPOREAL SHOCK WAVE LITHOTRIPSY (ESWL);  Surgeon: Hollice Espy, MD;  Location: [facility] ORS;  Service: Urology;  Laterality: Left;  . LITHOTRIPSY  2000    Family Psychiatric History: I have reviewed family psychiatric history from my progress note on 05/02/2018  Family History:  Family History  Problem Relation Age of Onset  . Hyperlipidemia Mother   . Stroke Mother   . Hypertension Mother   . Hyperlipidemia Father   . Hypertension Father   . Cancer Sister        breast  . Breast cancer Sister 6  . Heart disease Brother        myocardial infarction  . Heart attack Brother   . Hypertension Brother   . Fibromyalgia Sister   . Hypertension Sister   . Heart Problems Sister   . Hypertension Sister   . Prostate cancer Neg Hx   .  Kidney cancer Neg Hx   . Bladder Cancer Neg Hx     Social History: I have reviewed social history from my progress note on 05/02/2018 Social History   Socioeconomic History  . Marital status: Married    Spouse name: richard  . Number of children: 3  . Years of education: Not on file  . Highest education level: Some college, no degree  Occupational History    Comment: retired  Tobacco Use  . Smoking status: Never Smoker  . Smokeless tobacco: Never Used  Vaping Use  . Vaping Use: Never used  Substance and Sexual Activity  .  Alcohol use: No    Alcohol/week: 0.0 - 2.0 standard drinks    Comment: 2-3 times weekly  . Drug use: No  . Sexual activity: Yes    Birth control/protection: Post-menopausal, None  Other Topics Concern  . Not on file  Social History Narrative   Patient lives at home with her husband of 33 years. They have 3 adult children (2 boys, 1 girl). They recently moved back to [place] in January. 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   Social Determinants of Health   Financial Resource Strain: Not on file  Food Insecurity: Not on file  Transportation Needs: Not on file  Physical Activity: Not on file  Stress: Not on file  Social Connections: Not on file    Allergies:  Allergies  Allergen Reactions  . Penicillins Swelling    As a child    Metabolic Disorder Labs: Lab Results  Component Value Date   HGBA1C 4.8 06/11/2020   MPG 94 01/20/2018   Lab Results  Component Value Date   PROLACTIN 3.2 (L) 01/20/2018   Lab Results  Component Value Date   CHOL 191 06/11/2020   TRIG 90.0 06/11/2020   HDL 43.90 06/11/2020   CHOLHDL 4 06/11/2020   VLDL 18.0 06/11/2020   LDLCALC 129 (H) 06/11/2020   LDLCALC 142 (H) 01/20/2018   Lab Results  Component Value Date   TSH 0.69 06/11/2020   TSH 0.82 10/02/2018    Therapeutic Level Labs: No results found for: LITHIUM No results found for: VALPROATE No components found for:  CBMZ  Current Medications: Current Outpatient Medications  Medication Sig Dispense Refill  . augmented betamethasone dipropionate (DIPROLENE-AF) 0.05 % cream     . buPROPion (WELLBUTRIN XL) 300 MG 24 hr tablet Take 1 tablet (300 mg total) by mouth daily with breakfast. 30 tablet 2  . hydrOXYzine (ATARAX/VISTARIL) 10 MG tablet Take 1-2 tablets (10-20 mg total) by mouth daily as needed for anxiety. Only for severe anxiety sx. 45 tablet 2  . lamoTRIgine (LAMICTAL) 25 MG tablet Take 1 tablet (25 mg  total) by mouth daily. 30 tablet 2  . losartan (COZAAR) 50 MG tablet Take 1 tablet (50 mg total) by mouth daily. 90 tablet 0  . metoprolol succinate (TOPROL-XL) 25 MG 24 hr tablet Take 1 tablet (25 mg total) by mouth daily. 90 tablet 0  . sulfamethoxazole-trimethoprim (BACTRIM DS) 800-160 MG tablet Take 1 tablet by mouth every 12 (twelve) hours. (Patient not taking: Reported on 12/22/2020) 14 tablet 0  . traZODone (DESYREL) 50 MG tablet TAKE 0.5-1 TABLET BY MOUTH EVERY NIGHT AT BEDTIME AS NEEDED FOR SLEEP 30 tablet 2  . triamcinolone ointment (KENALOG) 0.1 % Apply topically.     No current facility-administered medications for this visit.  Musculoskeletal: Strength & Muscle Tone: UTA Gait & Station: UTA Patient leans: N/A  Psychiatric Specialty Exam: Review of Systems  Psychiatric/Behavioral: Negative for agitation, behavioral problems, confusion, decreased concentration, dysphoric mood, hallucinations, self-injury, sleep disturbance and suicidal ideas. The patient is not nervous/anxious and is not hyperactive.   All other systems reviewed and are negative.   There were no vitals taken for this visit.There is no height or weight on file to calculate BMI.  General Appearance: UTA  Eye Contact:  UTA  Speech:  Clear and Coherent  Volume:  Normal  Mood:  Euthymic  Affect:  UTA  Thought Process:  Goal Directed and Descriptions of Associations: Intact  Orientation:  Full (Time, Place, and Person)  Thought Content: Logical   Suicidal Thoughts:  No  Homicidal Thoughts:  No  Memory:  Immediate;   Fair Recent;   Fair Remote;   Fair  Judgement:  Fair  Insight:  Fair  Psychomotor Activity:  UTA  Concentration:  Concentration: Fair and Attention Span: Fair  Recall:  [employer] of Knowledge: Fair  Language: Fair  Akathisia:  No  Handed:  Right  AIMS (if indicated): UTA  Assets:  Communication Skills Desire for Improvement Housing Social Support  ADL's:  Intact  Cognition: WNL   Sleep:  Fair   Screenings: PHQ2-9   [place] Office Visit from 10/02/2018 in [place] Office Visit from 12/23/2016 in [place]  PHQ-2 Total Score 1 1  PHQ-9 Total Score 9 -       Assessment and Plan: Lenoir Facchini is a 70 year old Caucasian female who has a history of bipolar disorder was evaluated by telemedicine today.  Patient is currently doing well on the current medication regimen.  Plan as noted below.  Plan Bipolar disorder in remission Wellbutrin XL 300 mg p.o. daily Lamotrigine 25 mg p.o. daily Hydroxyzine 10 to 20 mg p.o. daily as needed for severe anxiety attacks  Insomnia-stable Trazodone 25 to 50 mg p.o. nightly as needed Patient to continue sleep hygiene techniques  Follow-up in clinic in 2 months or sooner if needed.  I have spent atleast 15 minutes non face to face with patient today. More than 50 % of the time was spent for preparing to see the patient ( e.g., review of test, records ),  ordering medications and test ,psychoeducation and supportive psychotherapy and care coordination,as well as documenting clinical information in electronic health record. This note was generated in part or whole with voice recognition software. Voice recognition is usually quite accurate but there are transcription errors that can and very often do occur. I apologize for any typographical errors that were not detected and corrected.        Ursula Alert, MD 12/24/2020, 12:29 PM

## 2020-12-25 DIAGNOSIS — H52223 Regular astigmatism, bilateral: Secondary | ICD-10-CM | POA: Diagnosis not present

## 2021-01-05 ENCOUNTER — Emergency Department: Payer: PPO

## 2021-01-05 ENCOUNTER — Other Ambulatory Visit: Payer: Self-pay

## 2021-01-05 ENCOUNTER — Emergency Department
Admission: EM | Admit: 2021-01-05 | Discharge: 2021-01-05 | Disposition: A | Discharge status: Home / Self Care | Payer: PPO | Attending: Emergency Medicine | Admitting: Emergency Medicine

## 2021-01-05 DIAGNOSIS — I10 Essential (primary) hypertension: Secondary | ICD-10-CM | POA: Insufficient documentation

## 2021-01-05 DIAGNOSIS — M7731 Calcaneal spur, right foot: Secondary | ICD-10-CM

## 2021-01-05 DIAGNOSIS — M722 Plantar fascial fibromatosis: Secondary | ICD-10-CM | POA: Diagnosis not present

## 2021-01-05 DIAGNOSIS — Z79899 Other long term (current) drug therapy: Secondary | ICD-10-CM | POA: Insufficient documentation

## 2021-01-05 DIAGNOSIS — M19071 Primary osteoarthritis, right ankle and foot: Secondary | ICD-10-CM | POA: Diagnosis not present

## 2021-01-05 DIAGNOSIS — M79671 Pain in right foot: Secondary | ICD-10-CM | POA: Diagnosis present

## 2021-01-05 DIAGNOSIS — Z8616 Personal history of COVID-19: Secondary | ICD-10-CM | POA: Insufficient documentation

## 2021-01-05 MED ORDER — MELOXICAM 15 MG PO TABS: 15.0000 mg | ORAL_TABLET | Freq: Every day | ORAL | 0 refills | Status: DC

## 2021-01-05 NOTE — ED Triage Notes (Signed)
Pt c/o right heel pain for the past month that has worsened, denies any injury

## 2021-01-05 NOTE — ED Notes (Signed)
See triage note  Presents with pain to right foot  States pain is mainly posterior  Denies any injury  Increased pain with ambulation

## 2021-01-05 NOTE — ED Provider Notes (Signed)
[facility] Emergency Department Provider Note ____________________________________________  Time seen: Approximately 8:21 AM  I have reviewed the triage vital signs and the nursing notes.   HISTORY  Chief Complaint Foot Pain    HPI Mirel Hundal is a 70 y.o. female who presents to the emergency department for evaluation and treatment of right foot pain.  No known trauma to the foot.  Pain has become progressively worse.  Pain seems to be worse in the morning.   Past Medical History:  Diagnosis Date  . Bipolar disorder ([place])    takes Abilify  . Depression   . Dysrhythmia 10/2016   LBBB on ekg...nothing to compare it to.  Marland Kitchen Heart murmur   . Hypertension   . Nephrolithiasis 2000   s/p lithotripsy  . 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 Bipolar disorder, in full remission, most recent episode depressed ([place]) 02/26/2020  . At risk for long QT syndrome 11/28/2019  . Suspected COVID-19 virus infection 08/31/2019  . Bipolar disorder, current episode depressed, mild ([place]) 08/28/2019  . High risk medication use 08/28/2019  . Educated about COVID-19 virus infection 03/20/2019  . Paronychia of great toe of left foot 03/20/2019  . Snoring 10/03/2018  . Insomnia 06/01/2017  . History of melanoma 03/29/2017  . LBBB (left bundle branch block) 12/07/2016  . Preoperative clearance 12/07/2016  . Nephrolithiasis 11/16/2016  . Vitamin D deficiency 12/27/2015  . Obesity 12/27/2015  . Hyperlipidemia LDL goal <100 12/27/2015  . Bipolar disorder, in partial remission, most recent episode depressed ([place]) 11/18/2014  . Encounter for preventive health examination 04/08/2014  . Dyspareunia 01/28/2014  . Vaginal dryness, menopausal 01/28/2014  . Fatigue 01/07/2014  . Decreased libido 01/07/2014  . Medication management 01/07/2014  . Essential hypertension 04/04/2013  . Mania ([place]) 04/04/2013    Past Surgical  History:  Procedure Laterality Date  . EXTRACORPOREAL SHOCK WAVE LITHOTRIPSY Left 01/06/2017   Procedure: EXTRACORPOREAL SHOCK WAVE LITHOTRIPSY (ESWL);  Surgeon: Hollice Espy, MD;  Location: [facility] ORS;  Service: Urology;  Laterality: Left;  . LITHOTRIPSY  2000    Prior to Admission medications   Medication Sig Start Date End Date Taking? Authorizing Provider  augmented betamethasone dipropionate (DIPROLENE-AF) 0.05 % cream  07/16/19   [provider]  buPROPion (WELLBUTRIN XL) 300 MG 24 hr tablet Take 1 tablet (300 mg total) by mouth daily with breakfast. 12/23/20   Ursula Alert, MD  hydrOXYzine (ATARAX/VISTARIL) 10 MG tablet Take 1-2 tablets (10-20 mg total) by mouth daily as needed for anxiety. Only for severe anxiety sx. 05/26/20   Ursula Alert, MD  lamoTRIgine (LAMICTAL) 25 MG tablet Take 1 tablet (25 mg total) by mouth daily. 12/23/20   Ursula Alert, MD  losartan (COZAAR) 50 MG tablet Take 1 tablet (50 mg total) by mouth daily. 09/12/20   Crecencio Mc, MD  metoprolol succinate (TOPROL-XL) 25 MG 24 hr tablet Take 1 tablet (25 mg total) by mouth daily. 09/12/20   Crecencio Mc, MD  traZODone (DESYREL) 50 MG tablet TAKE 0.5-1 TABLET BY MOUTH EVERY NIGHT AT BEDTIME AS NEEDED FOR SLEEP 11/03/20   Ursula Alert, MD  triamcinolone ointment (KENALOG) 0.1 % Apply topically. 08/29/20   [provider]    Allergies Penicillins  Family History  Problem Relation Age of Onset  . Hyperlipidemia Mother   . Stroke Mother   . Hypertension Mother   . Hyperlipidemia Father   .  Hypertension Father   . Cancer Sister        breast  . Breast cancer Sister 49  . Heart disease Brother        myocardial infarction  . Heart attack Brother   . Hypertension Brother   . Fibromyalgia Sister   . Hypertension Sister   . Heart Problems Sister   . Hypertension Sister   . Prostate cancer Neg Hx   . Kidney cancer Neg Hx   . Bladder Cancer Neg Hx     Social History Social  History   Tobacco Use  . Smoking status: Never Smoker  . Smokeless tobacco: Never Used  Vaping Use  . Vaping Use: Never used  Substance Use Topics  . Alcohol use: No    Alcohol/week: 0.0 - 2.0 standard drinks    Comment: 2-3 times weekly  . Drug use: No    Review of Systems Constitutional: Negative for fever. Cardiovascular: Negative for chest pain. Respiratory: Negative for shortness of breath. Musculoskeletal: Positive for right foot pain. Skin: Negative for open wounds or lesions. Neurological: Negative for decrease in sensation  ____________________________________________   PHYSICAL EXAM:  VITAL SIGNS: ED Triage Vitals  Enc Vitals Group     BP 01/05/21 0753 (!) 170/80     Pulse Rate 01/05/21 0753 79     Resp 01/05/21 0753 17     Temp 01/05/21 0753 98.5 F (36.9 C)     Temp Source 01/05/21 0753 Oral     SpO2 01/05/21 0753 98 %     Weight 01/05/21 0754 160 lb (72.6 kg)     Height 01/05/21 0754 5\' 1"  (1.549 m)     Head Circumference --      Peak Flow --      Pain Score 01/05/21 0753 7     Pain Loc --      Pain Edu? --      Excl. in [place]? --     Constitutional: Alert and oriented. Well appearing and in no acute distress. Eyes: Conjunctivae are clear without discharge or drainage Head: Atraumatic Neck: Supple Respiratory: No cough. Respirations are even and unlabored. Musculoskeletal: No deformity of the right foot.  Tenderness posterior lateral malleolus and calcaneus. Neurologic: Awake, alert, oriented x4.  Motor and sensory function of the toes intact. Skin: No open wounds. Psychiatric: Affect and behavior are appropriate.  ____________________________________________   LABS (all labs ordered are listed, but only abnormal results are displayed)  Labs Reviewed - No data to display ____________________________________________  RADIOLOGY  Image of the right foot shows prominent calcaneal spurs with ossification along the plantar fascial region.  I,  Sherrie George, personally viewed and evaluated these images (plain radiographs) as part of my medical decision making, as well as reviewing the written report by the radiologist.  No results found. ____________________________________________   PROCEDURES  Procedures  ____________________________________________   INITIAL IMPRESSION / ASSESSMENT AND PLAN / ED COURSE  Sunnie Odden is a 70 y.o. who presents to the emergency department for treatment and evaluation of right foot pain.  See HPI for further details.  X-ray does show spurs on the posterior calcaneus as well as plantar aspect of the calcaneus.  Symptoms most likely consistent with acute on chronic plantar fasciitis.  Plan will be to put her in a cam walker and prescribe Mobic.  Patient instructed to follow-up with podiatry.  She was also instructed to return to the emergency department for symptoms that change or worsen if unable schedule  an appointment with orthopedics or primary care.  Medications - No data to display  Pertinent labs & imaging results that were available during my care of the patient were reviewed by me and considered in my medical decision making (see chart for details).   _________________________________________   FINAL CLINICAL IMPRESSION(S) / ED DIAGNOSES  Final diagnoses:  None    ED Discharge Orders    None       If controlled substance prescribed during this visit, 12 month history viewed on the [place] prior to issuing an initial prescription for Schedule II or III opiod.   Victorino Dike, FNP 01/05/21 1328    Vladimir Crofts, MD 01/05/21 1537

## 2021-01-06 ENCOUNTER — Telehealth: Payer: Self-pay | Admitting: Internal Medicine

## 2021-01-06 NOTE — Telephone Encounter (Signed)
Left message for patient to call back and schedule Medicare Annual Wellness Visit (AWV)   This should be a virtual visit only=30 minutes.  No hx of AWV; please schedule at anytime with Denisa O'Brien-Blaney at [facility]   

## 2021-01-13 DIAGNOSIS — S86011A Strain of right Achilles tendon, initial encounter: Secondary | ICD-10-CM | POA: Diagnosis not present

## 2021-01-13 DIAGNOSIS — B351 Tinea unguium: Secondary | ICD-10-CM | POA: Diagnosis not present

## 2021-01-13 DIAGNOSIS — M79674 Pain in right toe(s): Secondary | ICD-10-CM | POA: Diagnosis not present

## 2021-01-14 DIAGNOSIS — M5136 Other intervertebral disc degeneration, lumbar region: Secondary | ICD-10-CM | POA: Diagnosis not present

## 2021-01-14 DIAGNOSIS — M9903 Segmental and somatic dysfunction of lumbar region: Secondary | ICD-10-CM | POA: Diagnosis not present

## 2021-01-14 DIAGNOSIS — M9905 Segmental and somatic dysfunction of pelvic region: Secondary | ICD-10-CM | POA: Diagnosis not present

## 2021-01-14 DIAGNOSIS — M5416 Radiculopathy, lumbar region: Secondary | ICD-10-CM | POA: Diagnosis not present

## 2021-01-16 DIAGNOSIS — M9905 Segmental and somatic dysfunction of pelvic region: Secondary | ICD-10-CM | POA: Diagnosis not present

## 2021-01-16 DIAGNOSIS — M5416 Radiculopathy, lumbar region: Secondary | ICD-10-CM | POA: Diagnosis not present

## 2021-01-16 DIAGNOSIS — M5136 Other intervertebral disc degeneration, lumbar region: Secondary | ICD-10-CM | POA: Diagnosis not present

## 2021-01-16 DIAGNOSIS — M9903 Segmental and somatic dysfunction of lumbar region: Secondary | ICD-10-CM | POA: Diagnosis not present

## 2021-01-19 DIAGNOSIS — M5416 Radiculopathy, lumbar region: Secondary | ICD-10-CM | POA: Diagnosis not present

## 2021-01-19 DIAGNOSIS — M9905 Segmental and somatic dysfunction of pelvic region: Secondary | ICD-10-CM | POA: Diagnosis not present

## 2021-01-19 DIAGNOSIS — M9903 Segmental and somatic dysfunction of lumbar region: Secondary | ICD-10-CM | POA: Diagnosis not present

## 2021-01-19 DIAGNOSIS — M5136 Other intervertebral disc degeneration, lumbar region: Secondary | ICD-10-CM | POA: Diagnosis not present

## 2021-01-21 DIAGNOSIS — M9905 Segmental and somatic dysfunction of pelvic region: Secondary | ICD-10-CM | POA: Diagnosis not present

## 2021-01-21 DIAGNOSIS — M5136 Other intervertebral disc degeneration, lumbar region: Secondary | ICD-10-CM | POA: Diagnosis not present

## 2021-01-21 DIAGNOSIS — M5416 Radiculopathy, lumbar region: Secondary | ICD-10-CM | POA: Diagnosis not present

## 2021-01-21 DIAGNOSIS — M9903 Segmental and somatic dysfunction of lumbar region: Secondary | ICD-10-CM | POA: Diagnosis not present

## 2021-01-26 DIAGNOSIS — M9905 Segmental and somatic dysfunction of pelvic region: Secondary | ICD-10-CM | POA: Diagnosis not present

## 2021-01-26 DIAGNOSIS — M5416 Radiculopathy, lumbar region: Secondary | ICD-10-CM | POA: Diagnosis not present

## 2021-01-26 DIAGNOSIS — M9903 Segmental and somatic dysfunction of lumbar region: Secondary | ICD-10-CM | POA: Diagnosis not present

## 2021-01-26 DIAGNOSIS — M5136 Other intervertebral disc degeneration, lumbar region: Secondary | ICD-10-CM | POA: Diagnosis not present

## 2021-01-28 DIAGNOSIS — M9903 Segmental and somatic dysfunction of lumbar region: Secondary | ICD-10-CM | POA: Diagnosis not present

## 2021-01-28 DIAGNOSIS — M5136 Other intervertebral disc degeneration, lumbar region: Secondary | ICD-10-CM | POA: Diagnosis not present

## 2021-01-28 DIAGNOSIS — M5416 Radiculopathy, lumbar region: Secondary | ICD-10-CM | POA: Diagnosis not present

## 2021-01-28 DIAGNOSIS — M9905 Segmental and somatic dysfunction of pelvic region: Secondary | ICD-10-CM | POA: Diagnosis not present

## 2021-01-29 DIAGNOSIS — M5416 Radiculopathy, lumbar region: Secondary | ICD-10-CM | POA: Diagnosis not present

## 2021-01-29 DIAGNOSIS — M9903 Segmental and somatic dysfunction of lumbar region: Secondary | ICD-10-CM | POA: Diagnosis not present

## 2021-01-29 DIAGNOSIS — M5136 Other intervertebral disc degeneration, lumbar region: Secondary | ICD-10-CM | POA: Diagnosis not present

## 2021-01-29 DIAGNOSIS — M9905 Segmental and somatic dysfunction of pelvic region: Secondary | ICD-10-CM | POA: Diagnosis not present

## 2021-02-02 DIAGNOSIS — M9905 Segmental and somatic dysfunction of pelvic region: Secondary | ICD-10-CM | POA: Diagnosis not present

## 2021-02-02 DIAGNOSIS — M5136 Other intervertebral disc degeneration, lumbar region: Secondary | ICD-10-CM | POA: Diagnosis not present

## 2021-02-02 DIAGNOSIS — M9903 Segmental and somatic dysfunction of lumbar region: Secondary | ICD-10-CM | POA: Diagnosis not present

## 2021-02-02 DIAGNOSIS — M5416 Radiculopathy, lumbar region: Secondary | ICD-10-CM | POA: Diagnosis not present

## 2021-02-04 DIAGNOSIS — M9903 Segmental and somatic dysfunction of lumbar region: Secondary | ICD-10-CM | POA: Diagnosis not present

## 2021-02-04 DIAGNOSIS — M5136 Other intervertebral disc degeneration, lumbar region: Secondary | ICD-10-CM | POA: Diagnosis not present

## 2021-02-04 DIAGNOSIS — M9905 Segmental and somatic dysfunction of pelvic region: Secondary | ICD-10-CM | POA: Diagnosis not present

## 2021-02-04 DIAGNOSIS — M5416 Radiculopathy, lumbar region: Secondary | ICD-10-CM | POA: Diagnosis not present

## 2021-02-05 DIAGNOSIS — M5136 Other intervertebral disc degeneration, lumbar region: Secondary | ICD-10-CM | POA: Diagnosis not present

## 2021-02-05 DIAGNOSIS — M9905 Segmental and somatic dysfunction of pelvic region: Secondary | ICD-10-CM | POA: Diagnosis not present

## 2021-02-05 DIAGNOSIS — M9903 Segmental and somatic dysfunction of lumbar region: Secondary | ICD-10-CM | POA: Diagnosis not present

## 2021-02-05 DIAGNOSIS — M5416 Radiculopathy, lumbar region: Secondary | ICD-10-CM | POA: Diagnosis not present

## 2021-02-10 DIAGNOSIS — M5136 Other intervertebral disc degeneration, lumbar region: Secondary | ICD-10-CM | POA: Diagnosis not present

## 2021-02-10 DIAGNOSIS — M2012 Hallux valgus (acquired), left foot: Secondary | ICD-10-CM | POA: Diagnosis not present

## 2021-02-10 DIAGNOSIS — M9903 Segmental and somatic dysfunction of lumbar region: Secondary | ICD-10-CM | POA: Diagnosis not present

## 2021-02-10 DIAGNOSIS — M9905 Segmental and somatic dysfunction of pelvic region: Secondary | ICD-10-CM | POA: Diagnosis not present

## 2021-02-10 DIAGNOSIS — S86011D Strain of right Achilles tendon, subsequent encounter: Secondary | ICD-10-CM | POA: Diagnosis not present

## 2021-02-10 DIAGNOSIS — M5416 Radiculopathy, lumbar region: Secondary | ICD-10-CM | POA: Diagnosis not present

## 2021-02-12 DIAGNOSIS — M9905 Segmental and somatic dysfunction of pelvic region: Secondary | ICD-10-CM | POA: Diagnosis not present

## 2021-02-12 DIAGNOSIS — M9903 Segmental and somatic dysfunction of lumbar region: Secondary | ICD-10-CM | POA: Diagnosis not present

## 2021-02-12 DIAGNOSIS — M5416 Radiculopathy, lumbar region: Secondary | ICD-10-CM | POA: Diagnosis not present

## 2021-02-12 DIAGNOSIS — M5136 Other intervertebral disc degeneration, lumbar region: Secondary | ICD-10-CM | POA: Diagnosis not present

## 2021-02-16 ENCOUNTER — Encounter: Payer: PPO | Admitting: Internal Medicine

## 2021-02-17 DIAGNOSIS — M5416 Radiculopathy, lumbar region: Secondary | ICD-10-CM | POA: Diagnosis not present

## 2021-02-17 DIAGNOSIS — M9903 Segmental and somatic dysfunction of lumbar region: Secondary | ICD-10-CM | POA: Diagnosis not present

## 2021-02-17 DIAGNOSIS — M9905 Segmental and somatic dysfunction of pelvic region: Secondary | ICD-10-CM | POA: Diagnosis not present

## 2021-02-17 DIAGNOSIS — M5136 Other intervertebral disc degeneration, lumbar region: Secondary | ICD-10-CM | POA: Diagnosis not present

## 2021-02-19 DIAGNOSIS — M9903 Segmental and somatic dysfunction of lumbar region: Secondary | ICD-10-CM | POA: Diagnosis not present

## 2021-02-19 DIAGNOSIS — M5136 Other intervertebral disc degeneration, lumbar region: Secondary | ICD-10-CM | POA: Diagnosis not present

## 2021-02-19 DIAGNOSIS — M5416 Radiculopathy, lumbar region: Secondary | ICD-10-CM | POA: Diagnosis not present

## 2021-02-19 DIAGNOSIS — M9905 Segmental and somatic dysfunction of pelvic region: Secondary | ICD-10-CM | POA: Diagnosis not present

## 2021-02-24 ENCOUNTER — Encounter: Payer: Self-pay | Admitting: Psychiatry

## 2021-02-24 ENCOUNTER — Other Ambulatory Visit: Payer: Self-pay

## 2021-02-24 ENCOUNTER — Telehealth (INDEPENDENT_AMBULATORY_CARE_PROVIDER_SITE_OTHER): Payer: PPO | Admitting: Psychiatry

## 2021-02-24 DIAGNOSIS — F3176 Bipolar disorder, in full remission, most recent episode depressed: Secondary | ICD-10-CM | POA: Diagnosis not present

## 2021-02-24 DIAGNOSIS — G4701 Insomnia due to medical condition: Secondary | ICD-10-CM | POA: Diagnosis not present

## 2021-02-24 MED ORDER — TRAZODONE HCL 50 MG PO TABS: 75.0000 mg | ORAL_TABLET | Freq: Every evening | ORAL | 1 refills | Status: DC | PRN

## 2021-02-24 MED ORDER — BUPROPION HCL ER (XL) 150 MG PO TB24: 150.0000 mg | ORAL_TABLET | Freq: Every day | ORAL | 1 refills | Status: DC

## 2021-02-24 NOTE — Progress Notes (Signed)
Virtual Visit via Telephone Cooke  I connected with Julia Cooke on 02/24/21 at 10:00 AM EDT by telephone and verified that I am speaking with the correct person using two identifiers.  Location Provider Location : [facility] Patient Location : Home  Participants: Patient , Provider   I discussed the limitations, risks, security and privacy concerns of performing an evaluation and management service by telephone and the availability of in person appointments. I also discussed with the patient that there may be a patient responsible charge related to this service. The patient expressed understanding and agreed to proceed.    I discussed the assessment and treatment plan with the patient. The patient was provided an opportunity to ask questions and all were answered. The patient agreed with the plan and demonstrated an understanding of the instructions.   The patient was advised to call back or seek an in-person evaluation if the symptoms worsen or if the condition fails to improve as anticipated.   Julia Cooke  02/24/2021 10:21 AM Julia Cooke  MRN:  [id number]  Chief Complaint:  Chief Complaint    Follow-up; Depression     HPI: Julia Cooke is a 70 year old Caucasian female, married, on SSI, lives in [place], has a history of bipolar disorder, insomnia was evaluated by telemedicine today.  Patient today reports overall mood symptoms are stable.  Denies any significant mood swings or sadness.  Denies any anxiety symptoms.  She however reports she is currently struggling with sleep problems.  She reports she wakes up in the middle of the night and is unable to go back to sleep for a long time.  She does have back pain however she does not think the back pain is waking her up.  She is currently following up with chiropractor and denies any pain today.  She reports following up with chiropractor has helped.  Patient does report that her family members have brought it to her  attention that she has a lot of movements around her mouth.  She however reports it does not distress her or bother her.  She reports according to her family members this may have been going on for several years.  Patient denies any suicidality, homicidality or perceptual disturbances.  Patient denies any other concerns today.  Visit Diagnosis:    ICD-10-CM   1. Bipolar disorder, in full remission, most recent episode depressed ([place])  F31.76 buPROPion (WELLBUTRIN XL) 150 MG 24 hr tablet    traZODone (DESYREL) 50 MG tablet  2. Insomnia due to medical condition  G47.01     Past Psychiatric History: I have reviewed past psychiatric history from my progress [place]oke on 05/02/2018  Past Medical History:  Past Medical History:  Diagnosis Date  . Bipolar disorder ([place])    takes Abilify  . Depression   . Dysrhythmia 10/2016   LBBB on ekg...nothing to compare it to.  Marland Kitchen Heart murmur   . Hypertension   . Nephrolithiasis 2000   s/p lithotripsy  . 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 EXTRACORPOREAL SHOCK WAVE LITHOTRIPSY Left 01/06/2017   Procedure: EXTRACORPOREAL SHOCK WAVE LITHOTRIPSY (ESWL);  Surgeon: Hollice Espy, MD;  Location: [facility] ORS;  Service: Urology;  Laterality: Left;  . LITHOTRIPSY  2000    Family Psychiatric History: Reviewed family psychiatric history from my progress [place]oke on 05/02/2018  Family History:  Family History  Problem Relation Age of Onset  .  Hyperlipidemia Mother   . Stroke Mother   . Hypertension Mother   . Hyperlipidemia Father   . Hypertension Father   . Cancer Sister        breast  . Breast cancer Sister 40  . Heart disease Brother        myocardial infarction  . Heart attack Brother   . Hypertension Brother   . Fibromyalgia Sister   . Hypertension Sister   . Heart Problems Sister   . Hypertension Sister   . Prostate cancer Neg Hx   . Kidney cancer Neg Hx   . Bladder Cancer Neg Hx     Social  History: Reviewed social history from my progress [place] on 05/02/2018 Social History   Socioeconomic History  . Marital status: Married    Spouse name: richard  . Number of children: 3  . Years of education: Not on file  . Highest education level: Some college, no degree  Occupational History    Comment: retired  Tobacco Use  . Smoking status: Never Smoker  . Smokeless tobacco: Never Used  Vaping Use  . Vaping Use: Never used  Substance and Sexual Activity  . Alcohol use: No    Alcohol/week: 0.0 - 2.0 standard drinks    Comment: 2-3 times weekly  . Drug use: No  . Sexual activity: Yes    Birth control/protection: Post-menopausal, None  Other Topics Concern  . Not on file  Social History Narrative   Patient lives at home with her husband of 26 years. They have 3 adult children (2 boys, 1 girl). They recently moved back to [place] in January. 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   Social Determinants of Health   Financial Resource Strain: Not on file  Food Insecurity: Not on file  Transportation Needs: Not on file  Physical Activity: Not on file  Stress: Not on file  Social Connections: Not on file    Allergies:  Allergies  Allergen Reactions  . Penicillins Swelling    As a child    Metabolic Disorder Labs: Lab Results  Component Value Date   HGBA1C 4.8 06/11/2020   MPG 94 01/20/2018   Lab Results  Component Value Date   PROLACTIN 3.2 (L) 01/20/2018   Lab Results  Component Value Date   CHOL 191 06/11/2020   TRIG 90.0 06/11/2020   HDL 43.90 06/11/2020   CHOLHDL 4 06/11/2020   VLDL 18.0 06/11/2020   LDLCALC 129 (H) 06/11/2020   LDLCALC 142 (H) 01/20/2018   Lab Results  Component Value Date   TSH 0.69 06/11/2020   TSH 0.82 10/02/2018    Therapeutic Level Labs: No results found for: LITHIUM No results found for: VALPROATE No components found for:  CBMZ  Current  Medications: Current Outpatient Medications  Medication Sig Dispense Refill  . buPROPion (WELLBUTRIN XL) 150 MG 24 hr tablet Take 1 tablet (150 mg total) by mouth daily. Reduced dose 30 tablet 1  . augmented betamethasone dipropionate (DIPROLENE-AF) 0.05 % cream     . hydrOXYzine (ATARAX/VISTARIL) 10 MG tablet Take 1-2 tablets (10-20 mg total) by mouth daily as needed for anxiety. Only for severe anxiety sx. 45 tablet 2  . lamoTRIgine (LAMICTAL) 25 MG tablet Take 1 tablet (25 mg total) by mouth daily. 30 tablet 2  . losartan (COZAAR) 50 MG tablet Take 1 tablet (50 mg total) by mouth daily. 90 tablet 0  .  meloxicam (MOBIC) 15 MG tablet Take 1 tablet (15 mg total) by mouth daily. 30 tablet 0  . metoprolol succinate (TOPROL-XL) 25 MG 24 hr tablet Take 1 tablet (25 mg total) by mouth daily. 90 tablet 0  . traZODone (DESYREL) 50 MG tablet Take 1.5-2 tablets (75-100 mg total) by mouth at bedtime as needed for sleep. 75 tablet 1  . triamcinolone ointment (KENALOG) 0.1 % Apply topically.     No current facility-administered medications for this visit.     Musculoskeletal: Strength & Muscle Tone: UTA Gait & Station: UTA Patient leans: N/A  Psychiatric Specialty Exam: Review of Systems  Psychiatric/Behavioral: Positive for sleep disturbance. Negative for agitation, behavioral problems, confusion, decreased concentration, dysphoric mood, hallucinations, self-injury and suicidal ideas. The patient is not nervous/anxious and is not hyperactive.   All other systems reviewed and are negative.   There were no vitals taken for this visit.There is no height or weight on file to calculate BMI.  General Appearance: UTA  Eye Contact:  UTA  Speech:  Clear and Coherent  Volume:  Normal  Mood:  Euthymic  Affect:  UTA  Thought Process:  Goal Directed and Descriptions of Associations: Intact  Orientation:  Full (Time, Place, and Person)  Thought Content: Logical   Suicidal Thoughts:  No  Homicidal  Thoughts:  No  Memory:  Immediate;   Fair Recent;   Fair Remote;   Fair  Judgement:  Fair  Insight:  Fair  Psychomotor Activity:  UTA  Concentration:  Concentration: Fair and Attention Span: Fair  Recall:  [employer] of Knowledge: Fair  Language: Fair  Akathisia:  No  Handed:  Right  AIMS (if indicated): UTA  Assets:  Communication Skills Desire for Improvement Housing Intimacy Social Support  ADL's:  Intact  Cognition: WNL  Sleep:  Poor   Screenings: PHQ2-9   Flowsheet Row Office Visit from 10/02/2018 in [place] Office Visit from 12/23/2016 in [place]  PHQ-2 Total Score 1 1  PHQ-9 Total Score 9 --    Flowsheet Row ED from 01/05/2021 in [facility] No Risk       Assessment and Plan: Julia Cooke is a 70 year old Caucasian female who has a history of bipolar disorder was evaluated by telemedicine today.  Patient is currently making progress with regards to her mood however does struggle with sleep and also reports abnormal movements around her mouth which is chronic.  Discussed plan as noted below.  Plan Bipolar disorder in remission Reduce Wellbutrin XL to 150 mg p.o. daily Continue Lamictal 25 mg p.o. daily Hydroxyzine 10 to 20 mg p.o. daily as needed for severe anxiety attacks  Insomnia-unstable Increase trazodone to 75 to 100 mg p.o. nightly as needed Discussed sleep hygiene techniques  Rule out tardive dyskinesia-discussed with patient since this is a phone call and she could not come into the office or connect by video unable to evaluate her abnormal movements.  However patient to come into the office for next visit for an evaluation.  She reports it does not bother her or distress her and this has been going on for a long time and she is not interested in medication management.  Follow-up in clinic in person in 1 month.   I have spent at least 20  minutes non face to face with patient today which includes the time spent for preparing to see the patient  (e.g., review of test,  records), ordering medications and test, psychoeducation and supportive psychotherapy, care coordination as well as documenting clinical information in electronic health record.     ,This Cooke was generated in part or whole with voice recognition software. Voice recognition is usually quite accurate but there are transcription errors that can and very often do occur. I apologize for any typographical errors that were not detected and corrected.        Ursula Alert, MD 02/25/2021, 8:11 AM

## 2021-02-25 DIAGNOSIS — M5136 Other intervertebral disc degeneration, lumbar region: Secondary | ICD-10-CM | POA: Diagnosis not present

## 2021-02-25 DIAGNOSIS — M9903 Segmental and somatic dysfunction of lumbar region: Secondary | ICD-10-CM | POA: Diagnosis not present

## 2021-02-25 DIAGNOSIS — M5416 Radiculopathy, lumbar region: Secondary | ICD-10-CM | POA: Diagnosis not present

## 2021-02-25 DIAGNOSIS — M9905 Segmental and somatic dysfunction of pelvic region: Secondary | ICD-10-CM | POA: Diagnosis not present

## 2021-03-02 DIAGNOSIS — M9905 Segmental and somatic dysfunction of pelvic region: Secondary | ICD-10-CM | POA: Diagnosis not present

## 2021-03-02 DIAGNOSIS — M5416 Radiculopathy, lumbar region: Secondary | ICD-10-CM | POA: Diagnosis not present

## 2021-03-02 DIAGNOSIS — M5136 Other intervertebral disc degeneration, lumbar region: Secondary | ICD-10-CM | POA: Diagnosis not present

## 2021-03-02 DIAGNOSIS — M9903 Segmental and somatic dysfunction of lumbar region: Secondary | ICD-10-CM | POA: Diagnosis not present

## 2021-03-05 DIAGNOSIS — M9903 Segmental and somatic dysfunction of lumbar region: Secondary | ICD-10-CM | POA: Diagnosis not present

## 2021-03-05 DIAGNOSIS — M9905 Segmental and somatic dysfunction of pelvic region: Secondary | ICD-10-CM | POA: Diagnosis not present

## 2021-03-05 DIAGNOSIS — M5136 Other intervertebral disc degeneration, lumbar region: Secondary | ICD-10-CM | POA: Diagnosis not present

## 2021-03-05 DIAGNOSIS — M5416 Radiculopathy, lumbar region: Secondary | ICD-10-CM | POA: Diagnosis not present

## 2021-03-09 DIAGNOSIS — M9905 Segmental and somatic dysfunction of pelvic region: Secondary | ICD-10-CM | POA: Diagnosis not present

## 2021-03-09 DIAGNOSIS — M9903 Segmental and somatic dysfunction of lumbar region: Secondary | ICD-10-CM | POA: Diagnosis not present

## 2021-03-09 DIAGNOSIS — M5416 Radiculopathy, lumbar region: Secondary | ICD-10-CM | POA: Diagnosis not present

## 2021-03-09 DIAGNOSIS — M5136 Other intervertebral disc degeneration, lumbar region: Secondary | ICD-10-CM | POA: Diagnosis not present

## 2021-03-12 DIAGNOSIS — M5136 Other intervertebral disc degeneration, lumbar region: Secondary | ICD-10-CM | POA: Diagnosis not present

## 2021-03-12 DIAGNOSIS — M9905 Segmental and somatic dysfunction of pelvic region: Secondary | ICD-10-CM | POA: Diagnosis not present

## 2021-03-12 DIAGNOSIS — M5416 Radiculopathy, lumbar region: Secondary | ICD-10-CM | POA: Diagnosis not present

## 2021-03-12 DIAGNOSIS — M9903 Segmental and somatic dysfunction of lumbar region: Secondary | ICD-10-CM | POA: Diagnosis not present

## 2021-03-16 DIAGNOSIS — M5136 Other intervertebral disc degeneration, lumbar region: Secondary | ICD-10-CM | POA: Diagnosis not present

## 2021-03-16 DIAGNOSIS — M9903 Segmental and somatic dysfunction of lumbar region: Secondary | ICD-10-CM | POA: Diagnosis not present

## 2021-03-16 DIAGNOSIS — M9905 Segmental and somatic dysfunction of pelvic region: Secondary | ICD-10-CM | POA: Diagnosis not present

## 2021-03-16 DIAGNOSIS — M5416 Radiculopathy, lumbar region: Secondary | ICD-10-CM | POA: Diagnosis not present

## 2021-03-19 DIAGNOSIS — M5416 Radiculopathy, lumbar region: Secondary | ICD-10-CM | POA: Diagnosis not present

## 2021-03-19 DIAGNOSIS — M9903 Segmental and somatic dysfunction of lumbar region: Secondary | ICD-10-CM | POA: Diagnosis not present

## 2021-03-19 DIAGNOSIS — M5136 Other intervertebral disc degeneration, lumbar region: Secondary | ICD-10-CM | POA: Diagnosis not present

## 2021-03-19 DIAGNOSIS — M9905 Segmental and somatic dysfunction of pelvic region: Secondary | ICD-10-CM | POA: Diagnosis not present

## 2021-03-22 ENCOUNTER — Other Ambulatory Visit: Payer: Self-pay | Admitting: Psychiatry

## 2021-03-22 ENCOUNTER — Other Ambulatory Visit: Payer: Self-pay | Admitting: Internal Medicine

## 2021-03-22 DIAGNOSIS — F3176 Bipolar disorder, in full remission, most recent episode depressed: Secondary | ICD-10-CM

## 2021-03-27 ENCOUNTER — Other Ambulatory Visit: Payer: Self-pay

## 2021-03-27 ENCOUNTER — Encounter: Payer: Self-pay | Admitting: Psychiatry

## 2021-03-27 ENCOUNTER — Ambulatory Visit (INDEPENDENT_AMBULATORY_CARE_PROVIDER_SITE_OTHER): Payer: PPO | Admitting: Psychiatry

## 2021-03-27 VITALS — BP 170/91 | HR 99 | Temp 98.1°F | Ht 60.63 in | Wt 176.0 lb

## 2021-03-27 DIAGNOSIS — G4701 Insomnia due to medical condition: Secondary | ICD-10-CM | POA: Diagnosis not present

## 2021-03-27 DIAGNOSIS — M5416 Radiculopathy, lumbar region: Secondary | ICD-10-CM | POA: Diagnosis not present

## 2021-03-27 DIAGNOSIS — M9905 Segmental and somatic dysfunction of pelvic region: Secondary | ICD-10-CM | POA: Diagnosis not present

## 2021-03-27 DIAGNOSIS — M9903 Segmental and somatic dysfunction of lumbar region: Secondary | ICD-10-CM | POA: Diagnosis not present

## 2021-03-27 DIAGNOSIS — M5136 Other intervertebral disc degeneration, lumbar region: Secondary | ICD-10-CM | POA: Diagnosis not present

## 2021-03-27 DIAGNOSIS — F3176 Bipolar disorder, in full remission, most recent episode depressed: Secondary | ICD-10-CM | POA: Diagnosis not present

## 2021-03-27 MED ORDER — TRAZODONE HCL 50 MG PO TABS: 75.0000 mg | ORAL_TABLET | Freq: Every evening | ORAL | 1 refills | Status: DC | PRN

## 2021-03-27 MED ORDER — BUPROPION HCL ER (XL) 150 MG PO TB24: 150.0000 mg | ORAL_TABLET | Freq: Every day | ORAL | 1 refills | Status: DC

## 2021-03-27 NOTE — Progress Notes (Signed)
[facility] OP Progress Note  03/27/2021 1:18 PM Saide Lanuza  MRN:  [id number]  Chief Complaint:  Chief Complaint    Follow-up; Depression     HPI: Julia Cooke is a 70 year old Caucasian female, married, on SSI, lives in [place], has a history of bipolar disorder, insomnia was evaluated in office today.  Patient today reports she is currently doing well with regards to her mood.  She does have relationship problems with her husband however nothing major.  They give each other company.  She has been traveling a lot with him.  She continues to enjoy reading books.  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  Patient reports the trazodone higher dosage has helped her with her sleep.  She however reports recently she has been watching TV shows late night and that has kept her awake.  She understands she needs to work on her sleep hygiene.  Patient reports she is compliant on her medications otherwise.  Denies side effects.  Patient reports her family may have brought to her attention that she has lip smacking and movements around her mouth.  She however has problems with her front tooth.  She reports she has upcoming appointment with her dentist.  Unknown if this is causing her movements.  However in session today no movements around the mouth were observed.  Patient advised to monitor closely.    Patient denies any suicidality, homicidality or perceptual disturbances.  Patient does struggle with pain especially of her left foot and is planning to follow-up with podiatry.  Patient denies any other concerns today.  Visit Diagnosis:    ICD-10-CM   1. Bipolar disorder, in full remission, most recent episode depressed ([place])  F31.76 traZODone (DESYREL) 50 MG tablet    buPROPion (WELLBUTRIN XL) 150 MG 24 hr tablet  2. Insomnia due to medical condition  G47.01     Past Psychiatric History: Reviewed past psychiatric history from progress note on 05/02/2018  Past Medical  History:  Past Medical History:  Diagnosis Date  . Bipolar disorder ([place])    takes Abilify  . Depression   . Dysrhythmia 10/2016   LBBB on ekg...nothing to compare it to.  Marland Kitchen Heart murmur   . Hypertension   . Nephrolithiasis 2000   s/p lithotripsy  . 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 EXTRACORPOREAL SHOCK WAVE LITHOTRIPSY Left 01/06/2017   Procedure: EXTRACORPOREAL SHOCK WAVE LITHOTRIPSY (ESWL);  Surgeon: Hollice Espy, MD;  Location: [facility] ORS;  Service: Urology;  Laterality: Left;  . LITHOTRIPSY  2000    Family Psychiatric History: Reviewed family psychiatric history from progress note on 05/02/2018  Family History:  Family History  Problem Relation Age of Onset  . Hyperlipidemia Mother   . Stroke Mother   . Hypertension Mother   . Hyperlipidemia Father   . Hypertension Father   . Cancer Sister        breast  . Breast cancer Sister 78  . Heart disease Brother        myocardial infarction  . Heart attack Brother   . Hypertension Brother   . Fibromyalgia Sister   . Hypertension Sister   . Heart Problems Sister   . Hypertension Sister   . Prostate cancer Neg Hx   . Kidney cancer Neg Hx   . Bladder Cancer Neg Hx     Social History: Reviewed social history from progress note on 05/02/2018  Social History   Socioeconomic History  . Marital status: Married    Spouse name: richard  . Number of children: 3  . Years of education: Not on file  . Highest education level: Some college, no degree  Occupational History    Comment: retired  Tobacco Use  . Smoking status: Never Smoker  . Smokeless tobacco: Never Used  Vaping Use  . Vaping Use: Never used  Substance and Sexual Activity  . Alcohol use: No    Alcohol/week: 0.0 - 2.0 standard drinks    Comment: 2-3 times weekly  . Drug use: No  . Sexual activity: Yes    Birth control/protection: Post-menopausal, None  Other Topics Concern  . Not on file  Social  History Narrative   Patient lives at home with her husband of 84 years. They have 3 adult children (2 boys, 1 girl). They recently moved back to [place] in January. 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   Social Determinants of Health   Financial Resource Strain: Not on file  Food Insecurity: Not on file  Transportation Needs: Not on file  Physical Activity: Not on file  Stress: Not on file  Social Connections: Not on file    Allergies:  Allergies  Allergen Reactions  . Penicillins Swelling    As a child    Metabolic Disorder Labs: Lab Results  Component Value Date   HGBA1C 4.8 06/11/2020   MPG 94 01/20/2018   Lab Results  Component Value Date   PROLACTIN 3.2 (L) 01/20/2018   Lab Results  Component Value Date   CHOL 191 06/11/2020   TRIG 90.0 06/11/2020   HDL 43.90 06/11/2020   CHOLHDL 4 06/11/2020   VLDL 18.0 06/11/2020   LDLCALC 129 (H) 06/11/2020   LDLCALC 142 (H) 01/20/2018   Lab Results  Component Value Date   TSH 0.69 06/11/2020   TSH 0.82 10/02/2018    Therapeutic Level Labs: No results found for: LITHIUM No results found for: VALPROATE No components found for:  CBMZ  Current Medications: Current Outpatient Medications  Medication Sig Dispense Refill  . hydrOXYzine (ATARAX/VISTARIL) 10 MG tablet Take 1-2 tablets (10-20 mg total) by mouth daily as needed for anxiety. Only for severe anxiety sx. 45 tablet 2  . lamoTRIgine (LAMICTAL) 25 MG tablet TAKE ONE TABLET BY MOUTH DAILY 30 tablet 2  . losartan (COZAAR) 50 MG tablet TAKE ONE TABLET BY MOUTH DAILY 90 tablet 0  . metoprolol succinate (TOPROL-XL) 25 MG 24 hr tablet TAKE ONE TABLET BY MOUTH DAILY 90 tablet 0  . buPROPion (WELLBUTRIN XL) 150 MG 24 hr tablet Take 1 tablet (150 mg total) by mouth daily. Reduced dose 30 tablet 1  . traZODone (DESYREL) 50 MG tablet Take 1.5-2 tablets (75-100 mg total) by mouth at bedtime as needed  for sleep. 75 tablet 1   No current facility-administered medications for this visit.     Musculoskeletal: Strength & Muscle Tone: UTA Gait & Station: normal Patient leans: N/A  Psychiatric Specialty Exam: Review of Systems  Musculoskeletal:       Left foot pain - chronic  Psychiatric/Behavioral: Positive for sleep disturbance (improving). Negative for agitation, behavioral problems, confusion, decreased concentration, dysphoric mood, hallucinations, self-injury and suicidal ideas. The patient is not nervous/anxious and is not hyperactive.   All other systems reviewed and are negative.   Blood pressure (!) 170/91, pulse 99, temperature 98.1 F (36.7  C), temperature source Temporal, height 5' 0.63" (1.54 m), weight 176 lb (79.8 kg).Body mass index is 33.66 kg/m.  General Appearance: Casual  Eye Contact:  Fair  Speech:  Clear and Coherent  Volume:  Normal  Mood:  Euthymic  Affect:  Congruent  Thought Process:  Goal Directed and Descriptions of Associations: Intact  Orientation:  Full (Time, Place, and Person)  Thought Content: Logical   Suicidal Thoughts:  No  Homicidal Thoughts:  No  Memory:  Immediate;   Fair Recent;   Fair Remote;   Fair  Judgement:  Fair  Insight:  Fair  Psychomotor Activity:  Normal  Concentration:  Concentration: Fair and Attention Span: Fair  Recall:  [employer] of Knowledge: Fair  Language: Fair  Akathisia:  No  Handed:  Right  AIMS (if indicated): Done  Assets:  Communication Skills Desire for Improvement Housing Social Support  ADL's:  Intact  Cognition: WNL  Sleep:  Improving   Screenings: [place] Office Visit from 03/27/2021 in [facility] Total Score 1    PHQ2-9   [place] Visit from 03/27/2021 in [facility] Office Visit from 10/02/2018 in [facility] Office Visit from 12/23/2016 in [facility]  PHQ-2  Total Score 0 1 1  PHQ-9 Total Score -- 9 --    [place] Office Visit from 03/27/2021 in [facility] ED from 01/05/2021 in [facility] Error: Question 6 not populated No Risk       Assessment and Plan: Eileen Kangas is a 70 year old Caucasian female who has a history of bipolar disorder was evaluated in office today.  Patient is currently making progress.  Plan as noted below.  Plan Bipolar disorder in remission Wellbutrin XL 150 mg p.o. daily-reduced dosage Lamictal 25 mg p.o. daily Hydroxyzine 10 to 20 mg p.o. daily as needed for severe anxiety attacks  Insomnia-improving Trazodone 75-100 mg p.o. nightly as needed Continue sleep hygiene techniques  Rule out tardive dyskinesia-will monitor closely.  Aims scale completed today- 1.  Patient advised to follow-up with primary care provider for elevated blood pressure reading today.  Follow-up in clinic in 3 months or sooner if needed.  This note was generated in part or whole with voice recognition software. Voice recognition is usually quite accurate but there are transcription errors that can and very often do occur. I apologize for any typographical errors that were not detected and corrected.        Ursula Alert, MD 03/27/2021, 1:18 PM

## 2021-04-01 DIAGNOSIS — M5416 Radiculopathy, lumbar region: Secondary | ICD-10-CM | POA: Diagnosis not present

## 2021-04-01 DIAGNOSIS — M9905 Segmental and somatic dysfunction of pelvic region: Secondary | ICD-10-CM | POA: Diagnosis not present

## 2021-04-01 DIAGNOSIS — M9903 Segmental and somatic dysfunction of lumbar region: Secondary | ICD-10-CM | POA: Diagnosis not present

## 2021-04-01 DIAGNOSIS — M5136 Other intervertebral disc degeneration, lumbar region: Secondary | ICD-10-CM | POA: Diagnosis not present

## 2021-04-06 DIAGNOSIS — M5136 Other intervertebral disc degeneration, lumbar region: Secondary | ICD-10-CM | POA: Diagnosis not present

## 2021-04-06 DIAGNOSIS — M9903 Segmental and somatic dysfunction of lumbar region: Secondary | ICD-10-CM | POA: Diagnosis not present

## 2021-04-06 DIAGNOSIS — M5416 Radiculopathy, lumbar region: Secondary | ICD-10-CM | POA: Diagnosis not present

## 2021-04-06 DIAGNOSIS — M9905 Segmental and somatic dysfunction of pelvic region: Secondary | ICD-10-CM | POA: Diagnosis not present

## 2021-04-08 ENCOUNTER — Encounter: Payer: Self-pay | Admitting: Internal Medicine

## 2021-04-08 ENCOUNTER — Other Ambulatory Visit: Payer: Self-pay

## 2021-04-08 ENCOUNTER — Ambulatory Visit (INDEPENDENT_AMBULATORY_CARE_PROVIDER_SITE_OTHER): Payer: Medicare HMO | Admitting: Internal Medicine

## 2021-04-08 VITALS — BP 140/76 | HR 75 | Temp 96.8°F | Resp 15 | Ht 60.0 in | Wt 174.2 lb

## 2021-04-08 DIAGNOSIS — E785 Hyperlipidemia, unspecified: Secondary | ICD-10-CM

## 2021-04-08 DIAGNOSIS — M25562 Pain in left knee: Secondary | ICD-10-CM

## 2021-04-08 DIAGNOSIS — Z1231 Encounter for screening mammogram for malignant neoplasm of breast: Secondary | ICD-10-CM | POA: Diagnosis not present

## 2021-04-08 DIAGNOSIS — Z0001 Encounter for general adult medical examination with abnormal findings: Secondary | ICD-10-CM

## 2021-04-08 DIAGNOSIS — R059 Cough, unspecified: Secondary | ICD-10-CM | POA: Diagnosis not present

## 2021-04-08 DIAGNOSIS — R053 Chronic cough: Secondary | ICD-10-CM | POA: Diagnosis not present

## 2021-04-08 DIAGNOSIS — I10 Essential (primary) hypertension: Secondary | ICD-10-CM

## 2021-04-08 DIAGNOSIS — U099 Post covid-19 condition, unspecified: Secondary | ICD-10-CM | POA: Diagnosis not present

## 2021-04-08 LAB — LIPID PANEL
Cholesterol: 225 mg/dL — ABNORMAL HIGH (ref 0–200)
HDL: 54.8 mg/dL (ref >39.00)
LDL Cholesterol: 154 mg/dL — ABNORMAL HIGH (ref 0–99)
NonHDL: 169.77
Total CHOL/HDL Ratio: 4
Triglycerides: 77 mg/dL (ref 0.0–149.0)
VLDL: 15.4 mg/dL (ref 0.0–40.0)

## 2021-04-08 LAB — COMPREHENSIVE METABOLIC PANEL
ALT: 15 U/L (ref 0–35)
AST: 15 U/L (ref 0–37)
Albumin: 4.4 g/dL (ref 3.5–5.2)
Alkaline Phosphatase: 58 U/L (ref 39–117)
BUN: 23 mg/dL (ref 6–23)
CO2: 31 mEq/L (ref 19–32)
Calcium: 9.8 mg/dL (ref 8.4–10.5)
Chloride: 104 mEq/L (ref 96–112)
Creatinine, Ser: 0.79 mg/dL (ref 0.40–1.20)
GFR: 76.38 mL/min (ref >60.00)
Glucose, Bld: 87 mg/dL (ref 70–99)
Potassium: 5 mEq/L (ref 3.5–5.1)
Sodium: 141 mEq/L (ref 135–145)
Total Bilirubin: 1.2 mg/dL (ref 0.2–1.2)
Total Protein: 7 g/dL (ref 6.0–8.3)

## 2021-04-08 LAB — TSH: TSH: 0.95 u[IU]/mL (ref 0.35–4.50)

## 2021-04-08 MED ORDER — AEROCHAMBER PLUS FLO-VU MISC: 0 refills | Status: DC

## 2021-04-08 MED ORDER — FLUTICASONE-SALMETEROL 100-50 MCG/ACT IN AEPB: 1.0000 | INHALATION_SPRAY | Freq: Two times a day (BID) | RESPIRATORY_TRACT | 3 refills | Status: DC

## 2021-04-08 NOTE — Patient Instructions (Addendum)
Your annual mammogram has been ordered.  You are encouraged (required) to call to make your appointment at [facility]  Please go and get a chest x ray at [facility] when you have time.  I am prescribed Advair to use twice daily for your cough for 30 days as a trial  Please rinse your mouth after you use it to avoid getting a yeast infection in your mouth (thrush)  Referral to Dr Nicola Police at [facility] to evaluate your knee

## 2021-04-08 NOTE — Progress Notes (Signed)
Patient ID: Julia Cooke, female    DOB: June 08, 1951  Age: 70 y.o. MRN: [id number]  The patient is here for annual preventive  examination and management of other chronic and acute problems.   The risk factors are reflected in the social history.  The roster of all physicians providing medical care to patient - is listed in the Snapshot section of the chart.  Activities of daily living:  The patient is 100% independent in all ADLs: dressing, toileting, feeding as well as independent mobility  Home safety : The patient has smoke detectors in the home. They wear seatbelts.  There are no firearms at home. There is no violence in the home.   There is no risks for hepatitis, STDs or HIV. There is no   history of blood transfusion. They have no travel history to infectious disease endemic areas of the world.  The patient has seen their dentist in the last six month. They have seen their eye doctor in the last year. She denies slight hearing difficulty with regard to whispered voices and some television programs.  They have deferred audiologic testing in the last year.  They do not  have excessive sun exposure. Discussed the need for sun protection: hats, long sleeves and use of sunscreen if there is significant sun exposure.   Diet: the importance of a healthy diet is discussed. They do have a healthy diet.  The benefits of regular aerobic exercise were discussed. She walks 4 times per week ,  20 minutes.   Depression screen: there are no signs or vegative symptoms of depression- irritability, change in appetite, anhedonia, sadness/tearfullness.  Cognitive assessment: the patient manages all their financial and personal affairs and is actively engaged. They could relate day,date,year and events; recalled 2/3 objects at 3 minutes; performed clock-face test normally.  The following portions of the patient's history were reviewed and updated as appropriate: allergies, current medications, past family  history, past medical history,  past surgical history, past social history  and problem list.  Visual acuity was not assessed per patient preference since she has regular follow up with her ophthalmologist. Hearing and body mass index were assessed and reviewed.   During the course of the visit the patient was educated and counseled about appropriate screening and preventive services including : fall prevention , diabetes screening, nutrition counseling, colorectal cancer screening, and recommended immunizations.    CC: The primary encounter diagnosis was Posterior knee pain, left. Diagnoses of Cough in adult patient, Breast cancer screening by mammogram, Hyperlipidemia LDL goal <100, Essential hypertension, Encounter for preventative adult health care exam with abnormal findings, Post-COVID chronic cough, and Posterior left knee pain were also pertinent to this visit.  1) LEFT LEG PAIN FOR SEVERAL MONTHS,  HURTS BEHIND THE KNEE AND RADIATES DOWN THE LEG. Aggravated by any walking,  Even in the grocery store.  seeing chiropractor since February who took x rays and noted misalignment of hips.   2) history of right achilles injury. Feels the left leg is overcompensating   3) right great toe pain .  HAS OA PER Dr. Vickki Muff, her podiatrist    4) persistent cough . Has been non productive. Started after her COVID infection in Late December 2021.  She reports that at  times she coughs so hard it  Makes her gag.    History Julia Cooke has a past medical history of Bipolar disorder ([place]), Depression, Dysrhythmia (10/2016), Heart murmur, Hypertension, Nephrolithiasis (2000), and Psychotic disorder ([place]).  She has a past surgical history that includes Lithotripsy (2000) and Extracorporeal shock wave lithotripsy (Left, 01/06/2017).   Her family history includes Breast cancer (age of onset: 28) in her sister; Cancer in her sister; Fibromyalgia in her sister; Heart Problems in her sister; Heart attack in her  brother; Heart disease in her brother; Hyperlipidemia in her father and mother; Hypertension in her brother, father, mother, sister, and sister; Stroke in her mother.She reports that she has never smoked. She has never used smokeless tobacco. She reports that she does not drink alcohol and does not use drugs.  Outpatient Medications Prior to Visit  Medication Sig Dispense Refill  . buPROPion (WELLBUTRIN XL) 150 MG 24 hr tablet Take 1 tablet (150 mg total) by mouth daily. Reduced dose 30 tablet 1  . hydrOXYzine (ATARAX/VISTARIL) 10 MG tablet Take 1-2 tablets (10-20 mg total) by mouth daily as needed for anxiety. Only for severe anxiety sx. 45 tablet 2  . lamoTRIgine (LAMICTAL) 25 MG tablet TAKE ONE TABLET BY MOUTH DAILY 30 tablet 2  . losartan (COZAAR) 50 MG tablet TAKE ONE TABLET BY MOUTH DAILY 90 tablet 0  . metoprolol succinate (TOPROL-XL) 25 MG 24 hr tablet TAKE ONE TABLET BY MOUTH DAILY 90 tablet 0  . traZODone (DESYREL) 50 MG tablet Take 1.5-2 tablets (75-100 mg total) by mouth at bedtime as needed for sleep. 75 tablet 1   No facility-administered medications prior to visit.    Review of Systems   Patient denies headache, fevers, malaise, unintentional weight loss, skin rash, eye pain, sinus congestion and sinus pain, sore throat, dysphagia,  hemoptysis , cough, dyspnea, wheezing, chest pain, palpitations, orthopnea, edema, abdominal pain, nausea, melena, diarrhea, constipation, flank pain, dysuria, hematuria, urinary  Frequency, nocturia, numbness, tingling, seizures,  Focal weakness, Loss of consciousness,  Tremor, insomnia, depression, anxiety, and suicidal ideation.      Objective:  BP 140/76 (BP Location: Left Arm, Patient Position: Sitting, Cuff Size: Normal)   Pulse 75   Temp (!) 96.8 F (36 C) (Temporal)   Resp 15   Ht 5' (1.524 m)   Wt 174 lb 3.2 oz (79 kg)   SpO2 97%   BMI 34.02 kg/m   Physical Exam  General appearance: alert, cooperative and appears stated age Ears:  normal TM's and external ear canals both ears Throat: lips, mucosa, and tongue normal; teeth and gums normal Neck: no adenopathy, no carotid bruit, supple, symmetrical, trachea midline and thyroid not enlarged, symmetric, no tenderness/mass/nodules Back: symmetric, no curvature. ROM normal. No CVA tenderness. Lungs: clear to auscultation bilaterally Heart: regular rate and rhythm, S1, S2 normal, no murmur, click, rub or gallop Abdomen: soft, non-tender; bowel sounds normal; no masses,  no organomegaly Pulses: 2+ and symmetric Skin: Skin color, texture, turgor normal. No rashes or lesions Lymph nodes: Cervical, supraclavicular, and axillary nodes normal.  Assessment & Plan:   Problem List Items Addressed This Visit      Unprioritized   Encounter for preventative adult health care exam with abnormal findings    age appropriate education and counseling updated, referrals for preventative services and immunizations addressed, dietary and smoking counseling addressed, most recent labs reviewed.  I have personally reviewed and have noted:  1) the patient's medical and social history 2) The pt's use of alcohol, tobacco, and illicit drugs 3) The patient's current medications and supplements 4) Functional ability including ADL's, fall risk, home safety risk, hearing and visual impairment 5) Diet and physical activities 6) Evidence for depression or mood  disorder 7) The patient's height, weight, and BMI have been recorded in the chart  I have made referrals, and provided counseling and education based on review of the above      Essential hypertension   Relevant Orders   Comprehensive metabolic panel (Completed)   Hyperlipidemia LDL goal <100   Relevant Orders   TSH (Completed)   Lipid panel (Completed)   Post-COVID chronic cough    She was infected In December  And recovered at home.  Her cough has been persistent for 4 months.  Chest x ray and Adair ordered.       Posterior left  knee pain    She describes a fullness in the popliteal area that becomes painful with any extended walking.  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  Referral to Orthopedics for evaluation        Other Visit Diagnoses    Posterior knee pain, left    -  Primary   Relevant Orders   Ambulatory referral to Orthopedic Surgery   Cough in adult patient       Relevant Orders   DG Chest 2 View   Breast cancer screening by mammogram       Relevant Orders   MM 3D SCREEN BREAST BILATERAL      I am having Sueanne Margarita start on fluticasone-salmeterol and aerochamber plus with mask. I am also having her maintain her hydrOXYzine, lamoTRIgine, losartan, metoprolol succinate, traZODone, and buPROPion.  Meds ordered this encounter  Medications  . fluticasone-salmeterol (ADVAIR) 100-50 MCG/ACT AEPB    Sig: Inhale 1 puff into the lungs 2 (two) times daily.    Dispense:  1 each    Refill:  3  . Spacer/Aero-Holding Chambers (AEROCHAMBER PLUS WITH MASK) inhaler    Sig: To use with advair mdi    Dispense:  1 each    Refill:  0    There are no discontinued medications.  Follow-up: Return in about 4 weeks (around 05/06/2021).   Crecencio Mc, MD

## 2021-04-11 DIAGNOSIS — G8929 Other chronic pain: Secondary | ICD-10-CM | POA: Insufficient documentation

## 2021-04-11 DIAGNOSIS — R053 Chronic cough: Secondary | ICD-10-CM | POA: Insufficient documentation

## 2021-04-11 DIAGNOSIS — M25562 Pain in left knee: Secondary | ICD-10-CM | POA: Insufficient documentation

## 2021-04-11 NOTE — Assessment & Plan Note (Addendum)
She was infected In December  And recovered at home.  Her cough has been persistent for 4 months.  Chest x ray and Adair ordered.

## 2021-04-11 NOTE — Assessment & Plan Note (Signed)
She describes a fullness in the popliteal area that becomes painful with any extended walking.  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  Referral to Orthopedics for evaluation

## 2021-04-11 NOTE — Assessment & Plan Note (Signed)

## 2021-04-14 DIAGNOSIS — M9903 Segmental and somatic dysfunction of lumbar region: Secondary | ICD-10-CM | POA: Diagnosis not present

## 2021-04-14 DIAGNOSIS — M9905 Segmental and somatic dysfunction of pelvic region: Secondary | ICD-10-CM | POA: Diagnosis not present

## 2021-04-14 DIAGNOSIS — M5136 Other intervertebral disc degeneration, lumbar region: Secondary | ICD-10-CM | POA: Diagnosis not present

## 2021-04-14 DIAGNOSIS — M5416 Radiculopathy, lumbar region: Secondary | ICD-10-CM | POA: Diagnosis not present

## 2021-04-17 ENCOUNTER — Ambulatory Visit
Admission: RE | Admit: 2021-04-17 | Discharge: 2021-04-17 | Disposition: A | Discharge status: Home / Self Care | Payer: Medicare HMO | Source: Ambulatory Visit | Attending: Internal Medicine | Admitting: Internal Medicine

## 2021-04-17 DIAGNOSIS — R059 Cough, unspecified: Secondary | ICD-10-CM

## 2021-04-20 NOTE — Progress Notes (Signed)
Your chest x ray is clear.  There is no sign of pneumonia or mass,; however, if your cough has not improved with use of Advair , please let me know and I will make a pulmonology referral for further evaluation .    Regards,   Deborra Medina, MD

## 2021-04-23 DIAGNOSIS — M1712 Unilateral primary osteoarthritis, left knee: Secondary | ICD-10-CM | POA: Diagnosis not present

## 2021-04-24 DIAGNOSIS — M1712 Unilateral primary osteoarthritis, left knee: Secondary | ICD-10-CM | POA: Diagnosis not present

## 2021-04-28 DIAGNOSIS — M9905 Segmental and somatic dysfunction of pelvic region: Secondary | ICD-10-CM | POA: Diagnosis not present

## 2021-04-28 DIAGNOSIS — M5136 Other intervertebral disc degeneration, lumbar region: Secondary | ICD-10-CM | POA: Diagnosis not present

## 2021-04-28 DIAGNOSIS — M5416 Radiculopathy, lumbar region: Secondary | ICD-10-CM | POA: Diagnosis not present

## 2021-04-28 DIAGNOSIS — M9903 Segmental and somatic dysfunction of lumbar region: Secondary | ICD-10-CM | POA: Diagnosis not present

## 2021-04-30 ENCOUNTER — Ambulatory Visit (INDEPENDENT_AMBULATORY_CARE_PROVIDER_SITE_OTHER): Payer: Medicare HMO

## 2021-04-30 VITALS — Ht 60.0 in | Wt 174.0 lb

## 2021-04-30 DIAGNOSIS — Z0000 Encounter for general adult medical examination without abnormal findings: Secondary | ICD-10-CM

## 2021-04-30 NOTE — Progress Notes (Addendum)
Subjective:   Julia Cooke is a 70 y.o. female who presents for Medicare Annual (Subsequent) preventive examination.  Review of Systems    No ROS.  Medicare Wellness Virtual Visit.  Visual/audio telehealth visit, UTA vital signs.   See social history for additional risk factors.   Cardiac Risk Factors include: advanced age (>12men, >64 women);hypertension     Objective:    Today's Vitals   04/30/21 1042  Weight: 174 lb (78.9 kg)  Height: 5' (1.524 m)   Body mass index is 33.98 kg/m.  Advanced Directives 04/30/2021 01/05/2021 12/23/2016 11/16/2016 06/15/2016  Does Patient Have a Medical Advance Directive? No No No No No  Would patient like information on creating a medical advance directive? Yes (MAU/Ambulatory/Procedural Areas - Information given) No - Patient declined Yes (MAU/Ambulatory/Procedural Areas - Information given) No - Patient declined -  Some encounter information is confidential and restricted. Go to Review Flowsheets activity to see all data.    Current Medications (verified) Outpatient Encounter Medications as of 04/30/2021  Medication Sig  . buPROPion (WELLBUTRIN XL) 150 MG 24 hr tablet Take 1 tablet (150 mg total) by mouth daily. Reduced dose  . fluticasone-salmeterol (ADVAIR) 100-50 MCG/ACT AEPB Inhale 1 puff into the lungs 2 (two) times daily.  . hydrOXYzine (ATARAX/VISTARIL) 10 MG tablet Take 1-2 tablets (10-20 mg total) by mouth daily as needed for anxiety. Only for severe anxiety sx.  . lamoTRIgine (LAMICTAL) 25 MG tablet TAKE ONE TABLET BY MOUTH DAILY  . losartan (COZAAR) 50 MG tablet TAKE ONE TABLET BY MOUTH DAILY  . metoprolol succinate (TOPROL-XL) 25 MG 24 hr tablet TAKE ONE TABLET BY MOUTH DAILY  . Spacer/Aero-Holding Chambers (AEROCHAMBER PLUS WITH MASK) inhaler To use with advair mdi  . traZODone (DESYREL) 50 MG tablet Take 1.5-2 tablets (75-100 mg total) by mouth at bedtime as needed for sleep.   No facility-administered encounter medications on  file as of 04/30/2021.    Allergies (verified) Penicillins   History: Past Medical History:  Diagnosis Date  . Bipolar disorder ([place])    takes Abilify  . Depression   . Dysrhythmia 10/2016   LBBB on ekg...nothing to compare it to.  Marland Kitchen Heart murmur   . Hypertension   . Nephrolithiasis 2000   s/p lithotripsy  . Psychotic disorder ([place])    mania   Past Surgical History:  Procedure Laterality Date  . EXTRACORPOREAL SHOCK WAVE LITHOTRIPSY Left 01/06/2017   Procedure: EXTRACORPOREAL SHOCK WAVE LITHOTRIPSY (ESWL);  Surgeon: Hollice Espy, MD;  Location: [facility] ORS;  Service: Urology;  Laterality: Left;  . LITHOTRIPSY  2000   Family History  Problem Relation Age of Onset  . Hyperlipidemia Mother   . Stroke Mother   . Hypertension Mother   . Hyperlipidemia Father   . Hypertension Father   . Cancer Sister        breast  . Breast cancer Sister 49  . Heart disease Brother        myocardial infarction  . Heart attack Brother   . Hypertension Brother   . Fibromyalgia Sister   . Hypertension Sister   . Heart Problems Sister   . Hypertension Sister   . Prostate cancer Neg Hx   . Kidney cancer Neg Hx   . Bladder Cancer Neg Hx    Social History   Socioeconomic History  . Marital status: Married    Spouse name: richard  . Number of children: 3  . Years of education: Not on file  . Highest  education level: Some college, no degree  Occupational History    Comment: retired  Tobacco Use  . Smoking status: Never Smoker  . Smokeless tobacco: Never Used  Vaping Use  . Vaping Use: Never used  Substance and Sexual Activity  . Alcohol use: No    Alcohol/week: 0.0 - 2.0 standard drinks    Comment: 2-3 times weekly  . Drug use: No  . Sexual activity: Yes    Birth control/protection: Post-menopausal, None  Other Topics Concern  . Not on file  Social History Narrative   Patient lives at home with her husband of 32 years. They have 3 adult children (2 boys, 1 girl). They recently  moved back to [place] in January. 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   Social Determinants of Health   Financial Resource Strain: Low Risk   . Difficulty of Paying Living Expenses: Not hard at all  Food Insecurity: No Food Insecurity  . Worried About Charity fundraiser in the Last Year: Never true  . Ran Out of Food in the Last Year: Never true  Transportation Needs: No Transportation Needs  . Lack of Transportation (Medical): No  . Lack of Transportation (Non-Medical): No  Physical Activity: Sufficiently Active  . Days of Exercise per Week: 4 days  . Minutes of Exercise per Session: 120 min  Stress: No Stress Concern Present  . Feeling of Stress : Not at all  Social Connections: Socially Integrated  . Frequency of Communication with Friends and Family: More than three times a week  . Frequency of Social Gatherings with Friends and Family: More than three times a week  . Attends Religious Services: More than 4 times per year  . Active Member of Clubs or Organizations: Yes  . Attends Archivist Meetings: More than 4 times per year  . Marital Status: Married    Tobacco Counseling Counseling given: Not Answered   Clinical Intake:  Pre-visit preparation completed: Yes        Diabetes: No  How often do you need to have someone help you when you read instructions, pamphlets, or other written materials from your doctor or pharmacy?: 1 - Never   Interpreter Needed?: No      Activities of Daily Living In your present state of health, do you have any difficulty performing the following activities: 04/30/2021  Hearing? N  Vision? N  Difficulty concentrating or making decisions? N  Walking or climbing stairs? Y  Comment Paces self. Chronic knee pain  Dressing or bathing? N  Doing errands, shopping? N  Preparing Food and eating ? N  Using the Toilet? N  In the past six months, have you  accidently leaked urine? N  Do you have problems with loss of bowel control? N  Managing your Medications? N  Managing your Finances? N  Housekeeping or managing your Housekeeping? N  Some recent data might be hidden    Patient Care Team: Crecencio Mc, MD as PCP - General (Internal Medicine) Nori Riis, PA-C as Physician Assistant (Urology)  Indicate any recent Medical Services you may have received from other than [facility] providers in the past year (date may be approximate).     Assessment:   This is a routine wellness examination for Julia Cooke.  I connected with Julia Cooke today by telephone and verified that I am speaking with the correct person using two  identifiers. Location patient: home Location provider: work Persons participating in the virtual visit: patient, Marine scientist.    I discussed the limitations, risks, security and privacy concerns of performing an evaluation and management service by telephone and the availability of in person appointments. The patient expressed understanding and verbally consented to this telephonic visit.    Interactive audio and video telecommunications were attempted between this provider and patient, however failed, due to patient having technical difficulties OR patient did not have access to video capability.  We continued and completed visit with audio only.  Some vital signs may be absent or patient reported.   Hearing/Vision screen  Hearing Screening   125Hz  250Hz  500Hz  1000Hz  2000Hz  3000Hz  4000Hz  6000Hz  8000Hz   Right ear:           Left ear:           Comments: Patient is able to hear conversational tones without difficulty.  No issues reported.  Vision Screening Comments: Followed by [facility] Wears corrective lenses Visual acuity not assessed, virtual visit.  They have seen their ophthalmologist in the last 12 months.     Dietary issues and exercise activities discussed: Current Exercise Habits: Structured  exercise class, Intensity: Mild  Healthy diet Good water intake  Goals Addressed              This Visit's Progress     Patient Stated   .  Weight (lb) < 174 lb (78.9 kg) (pt-stated)   174 lb (78.9 kg)     I would like to lose 20-25lbs Portion control Increase physical activity as tolerated      Depression Screen PHQ 2/9 Scores 04/30/2021 04/08/2021 04/08/2021 10/02/2018 12/23/2016  PHQ - 2 Score - 0 0 1 1  PHQ- 9 Score - 2 2 9  -  Exception Documentation Other- indicate reason in comment box - - - -  Not completed Followed by psychiatry every 3 months - - - -  Some encounter information is confidential and restricted. Go to Review Flowsheets activity to see all data.    Fall Risk Fall Risk  04/30/2021 04/08/2021 08/31/2019 03/31/2018 12/23/2016  Falls in the past year? 0 0 0 No No  Number falls in past yr: 0 - - - -  Injury with Fall? 0 - - - -  Follow up Falls evaluation completed Falls evaluation completed Falls evaluation completed - -    FALL RISK PREVENTION PERTAINING TO THE HOME: Handrails in use when climbing stairs? Yes Home free of loose throw rugs in walkways, pet beds, electrical cords, etc? Yes  Adequate lighting in your home to reduce risk of falls? Yes   ASSISTIVE DEVICES UTILIZED TO PREVENT FALLS: Life alert? No  Use of a cane, walker or w/c? No   TIMED UP AND GO: Was the test performed? No .   Cognitive Function:  Patient is alert and oriented x3.  Enjoys reading and knitting.    6CIT Screen 04/30/2021  What Year? 0 points  What month? 0 points  What time? 0 points  Count back from 20 0 points  Months in reverse 0 points    Immunizations Immunization History  Administered Date(s) Administered  . Fluad Quad(high Dose 65+) 08/17/2019  . Influenza, High Dose Seasonal PF 12/23/2016, 09/28/2017, 10/02/2018  . Influenza,inj,Quad PF,6+ Mos 12/24/2015  . PFIZER(Purple Top)SARS-COV-2 Vaccination 02/28/2020, 03/20/2020  . Pneumococcal Conjugate-13 03/31/2018   . Pneumococcal Polysaccharide-23 08/17/2019    TDAP status: Due, Education has been provided regarding the importance  of this vaccine. Advised may receive this vaccine at local pharmacy or Health Dept. Aware to provide a copy of the vaccination record if obtained from local pharmacy or Health Dept. Verbalized acceptance and understanding. Deferred.   Covid vaccines- discontinued per patient.   Shingles Vaccine- Education has been provided regarding the importance of this vaccine. Advised may receive this vaccine at local pharmacy or Health Dept. Aware to provide a copy of the vaccination record if obtained from local pharmacy or Health Dept. Verbalized acceptance and understanding. Deferred.   Health Maintenance Health Maintenance  Topic Date Due  . TETANUS/TDAP  Never done  . Zoster Vaccines- Shingrix (1 of 2) Never done  . MAMMOGRAM  09/13/2019  . COVID-19 Vaccine (3 - Booster for Pfizer series) 08/20/2020  . INFLUENZA VACCINE  06/29/2021  . Fecal DNA (Cologuard)  06/15/2022  . DEXA SCAN  Completed  . Hepatitis C Screening  Completed  . PNA vac Low Risk Adult  Completed  . HPV VACCINES  Aged Out   Mammogram- plans to schedule. Number provided.   Colorectal cancer screening: Type of screening: Cologuard. Completed 06/16/19. Repeat every 3 years  Lung Cancer Screening: (Low Dose CT Chest recommended if Age 80-80 years, 30 pack-year currently smoking OR have quit w/in 15years.) does not qualify.   Vision Screening: Recommended annual ophthalmology exams for early detection of glaucoma and other disorders of the eye. Is the patient up to date with their annual eye exam?  Yes   Dental Screening: Recommended annual dental exams for proper oral hygiene.  Community Resource Referral / Chronic Care Management: CRR required this visit?  No   CCM required this visit?  No      Plan:   Keep all routine maintenance appointments.   I have personally reviewed and noted the following in  the patient's chart:   . Medical and social history . Use of alcohol, tobacco or illicit drugs  . Current medications and supplements including opioid prescriptions.  . Functional ability and status . Nutritional status . Physical activity . Advanced directives . List of other physicians . Hospitalizations, surgeries, and ER visits in previous 12 months . Vitals . Screenings to include cognitive, depression, and falls . Referrals and appointments  In addition, I have reviewed and discussed with patient certain preventive protocols, quality metrics, and best practice recommendations. A written personalized care plan for preventive services as well as general preventive health recommendations were provided to patient via mychart.     OBrien-Blaney, Ariela Mochizuki L, LPN   0/01/8881    I have reviewed the above information and agree with above.   Deborra Medina, MD

## 2021-04-30 NOTE — Patient Instructions (Addendum)
Ms. Julia Cooke , Thank you for taking time to come for your Medicare Wellness Visit. I appreciate your ongoing commitment to your health goals. Please review the following plan we discussed and let me know if I can assist you in the future.   These are the goals we discussed: Goals      Patient Stated   .  Weight (lb) < 174 lb (78.9 kg) (pt-stated)      I would like to lose 20-25lbs Portion control Increase physical activity as tolerated       This is a list of the screening recommended for you and due dates:  Health Maintenance  Topic Date Due  . Tetanus Vaccine  Never done  . Zoster (Shingles) Vaccine (1 of 2) Never done  . Mammogram  09/13/2019  . COVID-19 Vaccine (3 - Booster for Pfizer series) 08/20/2020  . Flu Shot  06/29/2021  . Cologuard (Stool DNA test)  06/15/2022  . DEXA scan (bone density measurement)  Completed  . Hepatitis C Screening: USPSTF Recommendation to screen - Ages 39-79 yo.  Completed  . Pneumonia vaccines  Completed  . HPV Vaccine  Aged Out   Advanced directives: End of life planning; Advanced aging; Advanced directives discussed.  No HCPOA/Living Will.  Additional information provided to help them start the conversation with family.  Copy of HCPOA/Living Will requested upon completion.   Conditions/risks identified: none new.   Follow up in one year for your annual wellness visit.   Preventive Care 69 Years and Older, Female Preventive care refers to lifestyle choices and visits with your health care provider that can promote health and wellness. What does preventive care include?  A yearly physical exam. This is also called an annual well check.  Dental exams once or twice a year.  Routine eye exams. Ask your health care provider how often you should have your eyes checked.  Personal lifestyle choices, including:  Daily care of your teeth and gums.  Regular physical activity.  Eating a healthy diet.  Avoiding tobacco and drug  use.  Limiting alcohol use.  Practicing safe sex.  Taking low-dose aspirin every day.  Taking vitamin and mineral supplements as recommended by your health care provider. What happens during an annual well check? The services and screenings done by your health care provider during your annual well check will depend on your age, overall health, lifestyle risk factors, and family history of disease. Counseling  Your health care provider may ask you questions about your:  Alcohol use.  Tobacco use.  Drug use.  Emotional well-being.  Home and relationship well-being.  Sexual activity.  Eating habits.  History of falls.  Memory and ability to understand (cognition).  Work and work Statistician.  Reproductive health. Screening  You may have the following tests or measurements:  Height, weight, and BMI.  Blood pressure.  Lipid and cholesterol levels. These may be checked every 5 years, or more frequently if you are over 64 years old.  Skin check.  Lung cancer screening. You may have this screening every year starting at age 45 if you have a 30-pack-year history of smoking and currently smoke or have quit within the past 15 years.  Fecal occult blood test (FOBT) of the stool. You may have this test every year starting at age 9.  Flexible sigmoidoscopy or colonoscopy. You may have a sigmoidoscopy every 5 years or a colonoscopy every 10 years starting at age 36.  Hepatitis C blood test.  Hepatitis B  blood test.  Sexually transmitted disease (STD) testing.  Diabetes screening. This is done by checking your blood sugar (glucose) after you have not eaten for a while (fasting). You may have this done every 1-3 years.  Bone density scan. This is done to screen for osteoporosis. You may have this done starting at age 60.  Mammogram. This may be done every 1-2 years. Talk to your health care provider about how often you should have regular mammograms. Talk with your  health care provider about your test results, treatment options, and if necessary, the need for more tests. Vaccines  Your health care provider may recommend certain vaccines, such as:  Influenza vaccine. This is recommended every year.  Tetanus, diphtheria, and acellular pertussis (Tdap, Td) vaccine. You may need a Td booster every 10 years.  Zoster vaccine. You may need this after age 45.  Pneumococcal 13-valent conjugate (PCV13) vaccine. One dose is recommended after age 42.  Pneumococcal polysaccharide (PPSV23) vaccine. One dose is recommended after age 79. Talk to your health care provider about which screenings and vaccines you need and how often you need them. This information is not intended to replace advice given to you by your health care provider. Make sure you discuss any questions you have with your health care provider. Document Released: 12/12/2015 Document Revised: 08/04/2016 Document Reviewed: 09/16/2015 Elsevier Interactive Patient Education  2017 [place] Prevention in the Home Falls can cause injuries. They can happen to people of all ages. There are many things you can do to make your home safe and to help prevent falls. What can I do on the outside of my home?  Regularly fix the edges of walkways and driveways and fix any cracks.  Remove anything that might make you trip as you walk through a door, such as a raised step or threshold.  Trim any bushes or trees on the path to your home.  Use bright outdoor lighting.  Clear any walking paths of anything that might make someone trip, such as rocks or tools.  Regularly check to see if handrails are loose or broken. Make sure that both sides of any steps have handrails.  Any raised decks and porches should have guardrails on the edges.  Have any leaves, snow, or ice cleared regularly.  Use sand or salt on walking paths during winter.  Clean up any spills in your garage right away. This includes oil  or grease spills. What can I do in the bathroom?  Use night lights.  Install grab bars by the toilet and in the tub and shower. Do not use towel bars as grab bars.  Use non-skid mats or decals in the tub or shower.  If you need to sit down in the shower, use a plastic, non-slip stool.  Keep the floor dry. Clean up any water that spills on the floor as soon as it happens.  Remove soap buildup in the tub or shower regularly.  Attach bath mats securely with double-sided non-slip rug tape.  Do not have throw rugs and other things on the floor that can make you trip. What can I do in the bedroom?  Use night lights.  Make sure that you have a light by your bed that is easy to reach.  Do not use any sheets or blankets that are too big for your bed. They should not hang down onto the floor.  Have a firm chair that has side arms. You can use this for support  while you get dressed.  Do not have throw rugs and other things on the floor that can make you trip. What can I do in the kitchen?  Clean up any spills right away.  Avoid walking on wet floors.  Keep items that you use a lot in easy-to-reach places.  If you need to reach something above you, use a strong step stool that has a grab bar.  Keep electrical cords out of the way.  Do not use floor polish or wax that makes floors slippery. If you must use wax, use non-skid floor wax.  Do not have throw rugs and other things on the floor that can make you trip. What can I do with my stairs?  Do not leave any items on the stairs.  Make sure that there are handrails on both sides of the stairs and use them. Fix handrails that are broken or loose. Make sure that handrails are as long as the stairways.  Check any carpeting to make sure that it is firmly attached to the stairs. Fix any carpet that is loose or worn.  Avoid having throw rugs at the top or bottom of the stairs. If you do have throw rugs, attach them to the floor with  carpet tape.  Make sure that you have a light switch at the top of the stairs and the bottom of the stairs. If you do not have them, ask someone to add them for you. What else can I do to help prevent falls?  Wear shoes that:  Do not have high heels.  Have rubber bottoms.  Are comfortable and fit you well.  Are closed at the toe. Do not wear sandals.  If you use a stepladder:  Make sure that it is fully opened. Do not climb a closed stepladder.  Make sure that both sides of the stepladder are locked into place.  Ask someone to hold it for you, if possible.  Clearly mark and make sure that you can see:  Any grab bars or handrails.  First and last steps.  Where the edge of each step is.  Use tools that help you move around (mobility aids) if they are needed. These include:  Canes.  Walkers.  Scooters.  Crutches.  Turn on the lights when you go into a dark area. Replace any light bulbs as soon as they burn out.  Set up your furniture so you have a clear path. Avoid moving your furniture around.  If any of your floors are uneven, fix them.  If there are any pets around you, be aware of where they are.  Review your medicines with your doctor. Some medicines can make you feel dizzy. This can increase your chance of falling. Ask your doctor what other things that you can do to help prevent falls. This information is not intended to replace advice given to you by your health care provider. Make sure you discuss any questions you have with your health care provider. Document Released: 09/11/2009 Document Revised: 04/22/2016 Document Reviewed: 12/20/2014 Elsevier Interactive Patient Education  2017 [employer].

## 2021-05-08 DIAGNOSIS — M1712 Unilateral primary osteoarthritis, left knee: Secondary | ICD-10-CM | POA: Diagnosis not present

## 2021-05-14 DIAGNOSIS — M1712 Unilateral primary osteoarthritis, left knee: Secondary | ICD-10-CM | POA: Diagnosis not present

## 2021-05-19 ENCOUNTER — Encounter: Payer: Self-pay | Admitting: Internal Medicine

## 2021-05-19 ENCOUNTER — Ambulatory Visit (INDEPENDENT_AMBULATORY_CARE_PROVIDER_SITE_OTHER): Payer: Medicare HMO | Admitting: Internal Medicine

## 2021-05-19 ENCOUNTER — Other Ambulatory Visit: Payer: Self-pay

## 2021-05-19 DIAGNOSIS — M25562 Pain in left knee: Secondary | ICD-10-CM

## 2021-05-19 DIAGNOSIS — E66811 Obesity, class 1: Secondary | ICD-10-CM

## 2021-05-19 DIAGNOSIS — M5416 Radiculopathy, lumbar region: Secondary | ICD-10-CM | POA: Diagnosis not present

## 2021-05-19 DIAGNOSIS — U099 Post covid-19 condition, unspecified: Secondary | ICD-10-CM

## 2021-05-19 DIAGNOSIS — R053 Chronic cough: Secondary | ICD-10-CM | POA: Diagnosis not present

## 2021-05-19 DIAGNOSIS — I10 Essential (primary) hypertension: Secondary | ICD-10-CM

## 2021-05-19 DIAGNOSIS — M9903 Segmental and somatic dysfunction of lumbar region: Secondary | ICD-10-CM | POA: Diagnosis not present

## 2021-05-19 DIAGNOSIS — E6609 Other obesity due to excess calories: Secondary | ICD-10-CM | POA: Diagnosis not present

## 2021-05-19 DIAGNOSIS — Z6834 Body mass index (BMI) 34.0-34.9, adult: Secondary | ICD-10-CM | POA: Diagnosis not present

## 2021-05-19 DIAGNOSIS — M9905 Segmental and somatic dysfunction of pelvic region: Secondary | ICD-10-CM | POA: Diagnosis not present

## 2021-05-19 DIAGNOSIS — M5136 Other intervertebral disc degeneration, lumbar region: Secondary | ICD-10-CM | POA: Diagnosis not present

## 2021-05-19 NOTE — Patient Instructions (Addendum)
Continue taking losartan 50 mg daily and 25 metoprolol daily for blood pressure:    take one at night and one in the morning   Check  your  BP 5 times over the next 2 weeks and send me readings   Goal is 130/80 or less    Weight [employer]chers is a GREAT IDEA!      Return in 6 months for follow up

## 2021-05-19 NOTE — Progress Notes (Signed)
Subjective:  Patient ID: Julia Cooke, female    DOB: [birth year]  Age: 70 y.o. MRN: [id number]  CC: Diagnoses of Essential hypertension, Posterior left knee pain, Class 1 obesity due to excess calories without serious comorbidity with body mass index (BMI) of 34.0 to 34.9 in adult, and Post-COVID chronic cough were pertinent to this visit.  HPI Julia Cooke presents for follow up on multiple issues raised at her annual visit  This visit occurred during the SARS-CoV-2 public health emergency.  Safety protocols were in place, including screening questions prior to the visit, additional usage of staff PPE, and extensive cleaning of exam room while observing appropriate contact time as indicated for disinfecting solutions.   1) Posterior knee pain :  sent to orthopedics for eval and treatment   Dr Rodman Key diagnosed OA and bone spurs and referred her to PT which she states is helping    2) post COVID cough:  chest x ray was done and Advair was prescribed but only used it for 3 days,  couldn't use it correctly.  Cough gone  3) HTN: not checking at home   Outpatient Medications Prior to Visit  Medication Sig Dispense Refill   buPROPion (WELLBUTRIN XL) 150 MG 24 hr tablet Take 1 tablet (150 mg total) by mouth daily. Reduced dose 30 tablet 1   lamoTRIgine (LAMICTAL) 25 MG tablet TAKE ONE TABLET BY MOUTH DAILY 30 tablet 2   losartan (COZAAR) 50 MG tablet TAKE ONE TABLET BY MOUTH DAILY 90 tablet 0   metoprolol succinate (TOPROL-XL) 25 MG 24 hr tablet TAKE ONE TABLET BY MOUTH DAILY 90 tablet 0   traZODone (DESYREL) 50 MG tablet Take 1.5-2 tablets (75-100 mg total) by mouth at bedtime as needed for sleep. 75 tablet 1   meloxicam (MOBIC) 15 MG tablet Take 15 mg by mouth daily.     fluticasone-salmeterol (ADVAIR) 100-50 MCG/ACT AEPB Inhale 1 puff into the lungs 2 (two) times daily. (Patient not taking: Reported on 05/19/2021) 1 each 3   hydrOXYzine (ATARAX/VISTARIL) 10 MG tablet Take 1-2 tablets  (10-20 mg total) by mouth daily as needed for anxiety. Only for severe anxiety sx. (Patient not taking: Reported on 05/19/2021) 45 tablet 2   Spacer/Aero-Holding Chambers (AEROCHAMBER PLUS WITH MASK) inhaler To use with advair mdi (Patient not taking: Reported on 05/19/2021) 1 each 0   No facility-administered medications prior to visit.    Review of Systems;  Patient denies headache, fevers, malaise, unintentional weight loss, skin rash, eye pain, sinus congestion and sinus pain, sore throat, dysphagia,  hemoptysis , cough, dyspnea, wheezing, chest pain, palpitations, orthopnea, edema, abdominal pain, nausea, melena, diarrhea, constipation, flank pain, dysuria, hematuria, urinary  Frequency, nocturia, numbness, tingling, seizures,  Focal weakness, Loss of consciousness,  Tremor, insomnia, depression, anxiety, and suicidal ideation.      Objective:  BP 136/74 (BP Location: Left Arm, Patient Position: Sitting, Cuff Size: Large)   Pulse 75   Temp (!) 96.1 F (35.6 C) (Temporal)   Resp 14   Ht 5' (1.524 m)   Wt 175 lb 12.8 oz (79.7 kg)   SpO2 96%   BMI 34.33 kg/m   BP Readings from Last 3 Encounters:  05/19/21 136/74  04/08/21 140/76  01/05/21 (!) 170/80    Wt Readings from Last 3 Encounters:  05/19/21 175 lb 12.8 oz (79.7 kg)  04/30/21 174 lb (78.9 kg)  04/08/21 174 lb 3.2 oz (79 kg)    General appearance: alert, cooperative and appears stated  age Ears: normal TM's and external ear canals both ears Throat: lips, mucosa, and tongue normal; teeth and gums normal Neck: no adenopathy, no carotid bruit, supple, symmetrical, trachea midline and thyroid not enlarged, symmetric, no tenderness/mass/nodules Back: symmetric, no curvature. ROM normal. No CVA tenderness. Lungs: clear to auscultation bilaterally Heart: regular rate and rhythm, S1, S2 normal, no murmur, click, rub or gallop Abdomen: soft, non-tender; bowel sounds normal; no masses,  no organomegaly Pulses: 2+ and  symmetric Skin: Skin color, texture, turgor normal. No rashes or lesions Lymph nodes: Cervical, supraclavicular, and axillary nodes normal.  Lab Results  Component Value Date   HGBA1C 4.8 06/11/2020   HGBA1C 4.9 01/20/2018   HGBA1C 4.8 12/23/2016    Lab Results  Component Value Date   CREATININE 0.79 04/08/2021   CREATININE 0.84 06/11/2020   CREATININE 1.00 10/02/2019    Lab Results  Component Value Date   WBC 8.3 06/11/2020   HGB 14.3 06/11/2020   HCT 41.0 06/11/2020   PLT 213.0 06/11/2020   GLUCOSE 87 04/08/2021   CHOL 225 (H) 04/08/2021   TRIG 77.0 04/08/2021   HDL 54.80 04/08/2021   LDLDIRECT 162.0 12/23/2016   LDLCALC 154 (H) 04/08/2021   ALT 15 04/08/2021   AST 15 04/08/2021   NA 141 04/08/2021   K 5.0 04/08/2021   CL 104 04/08/2021   CREATININE 0.79 04/08/2021   BUN 23 04/08/2021   CO2 31 04/08/2021   TSH 0.95 04/08/2021   HGBA1C 4.8 06/11/2020   MICROALBUR 0.7 12/23/2016    DG Chest 2 View  Result Date: 04/19/2021 CLINICAL DATA:  Post COVID-19.  Persistent cough. EXAM: CHEST - 2 VIEW COMPARISON:  None. FINDINGS: The heart size and mediastinal contours are within normal limits. Both lungs are clear. The visualized skeletal structures are unremarkable. IMPRESSION: No active cardiopulmonary disease. Electronically Signed   By: Dorise Bullion III M.D   On: 04/19/2021 15:58    Assessment & Plan:   Problem List Items Addressed This Visit       Unprioritized   Essential hypertension    She reports compliance with medication regimen  but has an elevated reading today in office.  She is not using NSAIDs daily.  Discussed goal of 130/80  to preserve renal function.  She has been asked to check her  BP  at home and  submit readings for evaluation.   Lab Results  Component Value Date   CREATININE 0.79 04/08/2021   Lab Results  Component Value Date   NA 141 04/08/2021   K 5.0 04/08/2021   CL 104 04/08/2021   CO2 31 04/08/2021   Lab Results  Component  Value Date   LABMICR See below: 12/17/2019   LABMICR See below: 11/06/2019   MICROALBUR 0.7 12/23/2016            Obesity    I have addressed  BMI and recommended a low glycemic index diet utilizing smaller more frequent meals to increase metabolism.  I have also recommended that patient start exercising with a goal of 30 minutes of aerobic exercise a minimum of 5 days per week.       Post-COVID chronic cough    Her cough has resolved without use of Advair. Chest x ray was normal.        Posterior left knee pain    OA  Diagnosed by Orthopedic consult.  Continue PT       I provided  30 minutes of  face-to-face time during this  encounter reviewing patient's recent illness,  labs and imaging studies, providing counseling on the above mentioned problems , and coordination  of care .   I have discontinued Catalina Lunger Gloster's hydrOXYzine, fluticasone-salmeterol, and aerochamber plus with mask. I am also having her maintain her lamoTRIgine, losartan, metoprolol succinate, traZODone, buPROPion, and meloxicam.  No orders of the defined types were placed in this encounter.   Medications Discontinued During This Encounter  Medication Reason   fluticasone-salmeterol (ADVAIR) 100-50 MCG/ACT AEPB    Spacer/Aero-Holding Chambers (AEROCHAMBER PLUS WITH MASK) inhaler    hydrOXYzine (ATARAX/VISTARIL) 10 MG tablet     Follow-up: Return in about 6 months (around 11/18/2021).   Crecencio Mc, MD

## 2021-05-20 NOTE — Assessment & Plan Note (Signed)
Her cough has resolved without use of Advair. Chest x ray was normal.

## 2021-05-20 NOTE — Assessment & Plan Note (Signed)
I have addressed  BMI and recommended a low glycemic index diet utilizing smaller more frequent meals to increase metabolism.  I have also recommended that patient start exercising with a goal of 30 minutes of aerobic exercise a minimum of 5 days per week.  

## 2021-05-20 NOTE — Assessment & Plan Note (Addendum)
She reports compliance with medication regimen  but has an elevated reading today in office.  She is not using NSAIDs daily.  Discussed goal of 130/80  to preserve renal function.  She has been asked to check her  BP  at home and  submit readings for evaluation.   Lab Results  Component Value Date   CREATININE 0.79 04/08/2021   Lab Results  Component Value Date   NA 141 04/08/2021   K 5.0 04/08/2021   CL 104 04/08/2021   CO2 31 04/08/2021   Lab Results  Component Value Date   LABMICR See below: 12/17/2019   LABMICR See below: 11/06/2019   MICROALBUR 0.7 12/23/2016

## 2021-05-20 NOTE — Assessment & Plan Note (Signed)
OA  Diagnosed by Orthopedic consult.  Continue PT

## 2021-05-22 DIAGNOSIS — M1712 Unilateral primary osteoarthritis, left knee: Secondary | ICD-10-CM | POA: Diagnosis not present

## 2021-05-29 DIAGNOSIS — M1712 Unilateral primary osteoarthritis, left knee: Secondary | ICD-10-CM | POA: Diagnosis not present

## 2021-06-16 DIAGNOSIS — M9903 Segmental and somatic dysfunction of lumbar region: Secondary | ICD-10-CM | POA: Diagnosis not present

## 2021-06-16 DIAGNOSIS — M5416 Radiculopathy, lumbar region: Secondary | ICD-10-CM | POA: Diagnosis not present

## 2021-06-16 DIAGNOSIS — M5136 Other intervertebral disc degeneration, lumbar region: Secondary | ICD-10-CM | POA: Diagnosis not present

## 2021-06-16 DIAGNOSIS — M9905 Segmental and somatic dysfunction of pelvic region: Secondary | ICD-10-CM | POA: Diagnosis not present

## 2021-06-30 ENCOUNTER — Ambulatory Visit: Payer: PPO | Admitting: Psychiatry

## 2021-07-10 ENCOUNTER — Encounter: Payer: Self-pay | Admitting: Internal Medicine

## 2021-07-19 ENCOUNTER — Other Ambulatory Visit: Payer: Self-pay | Admitting: Psychiatry

## 2021-07-19 DIAGNOSIS — F3176 Bipolar disorder, in full remission, most recent episode depressed: Secondary | ICD-10-CM

## 2021-07-23 ENCOUNTER — Other Ambulatory Visit: Payer: Self-pay | Admitting: Internal Medicine

## 2021-07-30 ENCOUNTER — Other Ambulatory Visit: Payer: Self-pay | Admitting: Psychiatry

## 2021-07-30 DIAGNOSIS — F3176 Bipolar disorder, in full remission, most recent episode depressed: Secondary | ICD-10-CM

## 2021-07-31 DIAGNOSIS — M5416 Radiculopathy, lumbar region: Secondary | ICD-10-CM | POA: Diagnosis not present

## 2021-07-31 DIAGNOSIS — M9905 Segmental and somatic dysfunction of pelvic region: Secondary | ICD-10-CM | POA: Diagnosis not present

## 2021-07-31 DIAGNOSIS — M5136 Other intervertebral disc degeneration, lumbar region: Secondary | ICD-10-CM | POA: Diagnosis not present

## 2021-07-31 DIAGNOSIS — M9903 Segmental and somatic dysfunction of lumbar region: Secondary | ICD-10-CM | POA: Diagnosis not present

## 2021-08-13 ENCOUNTER — Other Ambulatory Visit: Payer: Self-pay | Admitting: Internal Medicine

## 2021-08-13 DIAGNOSIS — H0011 Chalazion right upper eyelid: Secondary | ICD-10-CM | POA: Diagnosis not present

## 2021-08-21 DIAGNOSIS — H5711 Ocular pain, right eye: Secondary | ICD-10-CM | POA: Diagnosis not present

## 2021-08-21 DIAGNOSIS — H0012 Chalazion right lower eyelid: Secondary | ICD-10-CM | POA: Diagnosis not present

## 2021-09-08 DIAGNOSIS — H0014 Chalazion left upper eyelid: Secondary | ICD-10-CM | POA: Diagnosis not present

## 2021-09-08 DIAGNOSIS — H0011 Chalazion right upper eyelid: Secondary | ICD-10-CM | POA: Diagnosis not present

## 2021-09-18 DIAGNOSIS — Z85828 Personal history of other malignant neoplasm of skin: Secondary | ICD-10-CM | POA: Diagnosis not present

## 2021-09-18 DIAGNOSIS — D225 Melanocytic nevi of trunk: Secondary | ICD-10-CM | POA: Diagnosis not present

## 2021-09-18 DIAGNOSIS — L820 Inflamed seborrheic keratosis: Secondary | ICD-10-CM | POA: Diagnosis not present

## 2021-09-18 DIAGNOSIS — H00014 Hordeolum externum left upper eyelid: Secondary | ICD-10-CM | POA: Diagnosis not present

## 2021-09-18 DIAGNOSIS — D2262 Melanocytic nevi of left upper limb, including shoulder: Secondary | ICD-10-CM | POA: Diagnosis not present

## 2021-09-18 DIAGNOSIS — L821 Other seborrheic keratosis: Secondary | ICD-10-CM | POA: Diagnosis not present

## 2021-09-18 DIAGNOSIS — D2261 Melanocytic nevi of right upper limb, including shoulder: Secondary | ICD-10-CM | POA: Diagnosis not present

## 2021-09-18 DIAGNOSIS — D485 Neoplasm of uncertain behavior of skin: Secondary | ICD-10-CM | POA: Diagnosis not present

## 2021-09-18 DIAGNOSIS — D2272 Melanocytic nevi of left lower limb, including hip: Secondary | ICD-10-CM | POA: Diagnosis not present

## 2021-09-23 ENCOUNTER — Other Ambulatory Visit: Payer: Self-pay | Admitting: Psychiatry

## 2021-09-23 DIAGNOSIS — F3176 Bipolar disorder, in full remission, most recent episode depressed: Secondary | ICD-10-CM

## 2021-10-06 ENCOUNTER — Other Ambulatory Visit: Payer: Self-pay | Admitting: Psychiatry

## 2021-10-06 DIAGNOSIS — F3176 Bipolar disorder, in full remission, most recent episode depressed: Secondary | ICD-10-CM

## 2021-11-10 ENCOUNTER — Ambulatory Visit (INDEPENDENT_AMBULATORY_CARE_PROVIDER_SITE_OTHER): Payer: Medicare HMO

## 2021-11-10 ENCOUNTER — Other Ambulatory Visit: Payer: Self-pay

## 2021-11-10 DIAGNOSIS — Z23 Encounter for immunization: Secondary | ICD-10-CM | POA: Diagnosis not present

## 2021-11-19 ENCOUNTER — Ambulatory Visit: Payer: Medicare HMO | Admitting: Internal Medicine

## 2021-11-22 ENCOUNTER — Other Ambulatory Visit: Payer: Self-pay | Admitting: Internal Medicine

## 2021-11-26 ENCOUNTER — Encounter: Payer: Self-pay | Admitting: Internal Medicine

## 2021-11-26 ENCOUNTER — Telehealth (INDEPENDENT_AMBULATORY_CARE_PROVIDER_SITE_OTHER): Payer: Medicare HMO | Admitting: Internal Medicine

## 2021-11-26 VITALS — Ht 60.0 in | Wt 165.0 lb

## 2021-11-26 DIAGNOSIS — R5383 Other fatigue: Secondary | ICD-10-CM | POA: Diagnosis not present

## 2021-11-26 DIAGNOSIS — F3175 Bipolar disorder, in partial remission, most recent episode depressed: Secondary | ICD-10-CM

## 2021-11-26 DIAGNOSIS — M25561 Pain in right knee: Secondary | ICD-10-CM

## 2021-11-26 DIAGNOSIS — F5101 Primary insomnia: Secondary | ICD-10-CM

## 2021-11-26 DIAGNOSIS — R7301 Impaired fasting glucose: Secondary | ICD-10-CM | POA: Diagnosis not present

## 2021-11-26 DIAGNOSIS — M25562 Pain in left knee: Secondary | ICD-10-CM | POA: Diagnosis not present

## 2021-11-26 DIAGNOSIS — E785 Hyperlipidemia, unspecified: Secondary | ICD-10-CM

## 2021-11-26 DIAGNOSIS — G8929 Other chronic pain: Secondary | ICD-10-CM

## 2021-11-26 DIAGNOSIS — I10 Essential (primary) hypertension: Secondary | ICD-10-CM

## 2021-11-26 DIAGNOSIS — K580 Irritable bowel syndrome with diarrhea: Secondary | ICD-10-CM | POA: Diagnosis not present

## 2021-11-26 MED ORDER — OMEPRAZOLE 20 MG PO CPDR: 20.0000 mg | DELAYED_RELEASE_CAPSULE | Freq: Every day | ORAL | 3 refills | Status: DC

## 2021-11-26 MED ORDER — DICYCLOMINE HCL 10 MG PO CAPS: 10.0000 mg | ORAL_CAPSULE | Freq: Three times a day (TID) | ORAL | 1 refills | Status: DC

## 2021-11-26 MED ORDER — MELOXICAM 15 MG PO TABS: 15.0000 mg | ORAL_TABLET | Freq: Every day | ORAL | 1 refills | Status: DC

## 2021-11-26 NOTE — Progress Notes (Signed)
Virtual Visit coverted to Telephone Note  This visit type was conducted due to national recommendations for restrictions regarding the COVID-19 pandemic (e.g. social distancing).  This format is felt to be most appropriate for this patient at this time.  All issues noted in this document were discussed and addressed.  No physical exam was performed (except for noted visual exam findings with Video Visits).   I connected withNAME@ on 11/26/21 at  1:30 PM EST by a video enabled telemedicine application and verified that I am speaking with the correct person using two identifiers. Location patient: home Location provider: work or home office Persons participating in the virtual visit: patient, provider  I discussed the limitations, risks, security and privacy concerns of performing an evaluation and management service by telephone and the availability of in person appointments. I also discussed with the patient that there may be a patient responsible charge related to this service. The patient expressed understanding and agreed to proceed.  Interactive audio and video telecommunications were attempted between this provider and patient, however failed, due to patient having technical difficulties .   We continued and completed visit with audio only.    Reason for visit: follow up o n multiple issues   HPI:   1) both knees are "giving out on her" frequently, left knee hurts after grocery shopping, and after rest .    back hurts after several hours of standing,    joints do not hurt during exercise program unless she puts all her weight on her knee .  She can't perform burpees but can ride the recumbent bike , [place] the rowing machine,  and other stationery bike  for up to 15 minutes .  She has had previous orthopedic consultation with [facility], he noted OA and bone spurring,  prescribed  6 weeks of PT which helped only minimally to reduce her pain so she stopped doing the exercises they gave her. .   Patient wants  a second orthopedic opinion but  now has [place] .no longer taking meloxicam bc script ran out.    2) IBS   has loose BMs after many meals,  but not every meal.  No nausea , unintentional weight loss, or blood in stool.  No recent colonoscopy (cologuard neg x 2 )    3) INsomnia:  persistnet taking 50 mg trazodone.  Sleeps for a few hours in front of tv.  then wakes up and remains wide awake after a few hours.  Not sleepy during the day .  Busy all day long until 8:00 at night .  Husbnad watches tv in the bedroom     ROS: See pertinent positives and negatives per HPI.  Past Medical History:  Diagnosis Date   Bipolar disorder ([place])    takes Abilify   Depression    Dysrhythmia 10/2016   LBBB on ekg...nothing to compare it to.   Heart murmur    Hypertension    Nephrolithiasis 2000   s/p lithotripsy   Psychotic disorder ([place])    mania    Past Surgical History:  Procedure Laterality Date   EXTRACORPOREAL SHOCK WAVE LITHOTRIPSY Left 01/06/2017   Procedure: EXTRACORPOREAL SHOCK WAVE LITHOTRIPSY (ESWL);  Surgeon: Hollice Espy, MD;  Location: [facility] ORS;  Service: Urology;  Laterality: Left;   LITHOTRIPSY  2000    Family History  Problem Relation Age of Onset   Hyperlipidemia Mother    Stroke Mother    Hypertension Mother    Hyperlipidemia Father  Hypertension Father    Cancer Sister        breast   Breast cancer Sister 42   Heart disease Brother        myocardial infarction   Heart attack Brother    Hypertension Brother    Fibromyalgia Sister    Hypertension Sister    Heart Problems Sister    Hypertension Sister    Prostate cancer Neg Hx    Kidney cancer Neg Hx    Bladder Cancer Neg Hx     SOCIAL HX:  reports that she has never smoked. She has never used smokeless tobacco. She reports that she does not drink alcohol and does not use drugs.    Current Outpatient Medications:    buPROPion (WELLBUTRIN XL) 150 MG 24 hr tablet, Take 1 tablet (150 mg total) by  mouth daily. Reduced dose, Disp: 30 tablet, Rfl: 1   dicyclomine (BENTYL) 10 MG capsule, Take 1 capsule (10 mg total) by mouth 4 (four) times daily -  before meals and at bedtime., Disp: 90 capsule, Rfl: 1   lamoTRIgine (LAMICTAL) 25 MG tablet, TAKE ONE TABLET BY MOUTH DAILY, Disp: 30 tablet, Rfl: 2   losartan (COZAAR) 50 MG tablet, TAKE ONE TABLET BY MOUTH DAILY, Disp: 90 tablet, Rfl: 3   metoprolol succinate (TOPROL-XL) 25 MG 24 hr tablet, TAKE ONE TABLET BY MOUTH DAILY, Disp: 90 tablet, Rfl: 0   omeprazole (PRILOSEC) 20 MG capsule, Take 1 capsule (20 mg total) by mouth daily., Disp: 30 capsule, Rfl: 3   traZODone (DESYREL) 50 MG tablet, TAKE 1.5-2 TABLETS BY MOUTH AT BEDTIME AS NEEDED FOR SLEEP, Disp: 75 tablet, Rfl: 1   meloxicam (MOBIC) 15 MG tablet, Take 1 tablet (15 mg total) by mouth daily., Disp: 90 tablet, Rfl: 1  EXAM:   General impression: alert, cooperative and articulate.  No signs of being in distress  Lungs: speech is fluent sentence length suggests that patient is not short of breath and not punctuated by cough, sneezing or sniffing. Marland Kitchen   Psych: affect normal.  speech is articulate and non pressured .  Denies suicidal thoughts    ASSESSMENT AND PLAN:  Discussed the following assessment and plan:  Essential hypertension - Plan: Comprehensive metabolic panel, Microalbumin / creatinine urine ratio  Fatigue, unspecified type - Plan: TSH, CBC with Differential/Platelet  Hyperlipidemia LDL goal <100 - Plan: Lipid panel  Impaired fasting glucose - Plan: Comprehensive metabolic panel, Hemoglobin A1c  Chronic pain of both knees - Plan: Ambulatory referral to Orthopedic Surgery  Bipolar disorder, in partial remission, most recent episode depressed ([place])  Primary insomnia  Irritable bowel syndrome with diarrhea  Chronic pain of both knees She has failed to improve with PT ordered by Orthopedics and requests a second opinion .  Refilling meloxicam.  Referral to another  provider in progress  Essential hypertension Well controlled on current regimen. Home readings are < 140/90. Renal function stable, no changes today.  Bipolar disorder, in partial remission, most recent episode depressed ([place]) Managed with lamictal and wellbutrin  . Mood appears stable for the last 1-2 years. Continue follow up with provider at [facility]   Insomnia Advised to increase trazodone to 100 mg qhs; reviewed bedtime hygiene   IBS (irritable bowel syndrome) Suggested by  History  No warning signs.  No colonoscopy report for reviewe (has been screening with cologuard for the last 6 years ). Trial of dicyclomine.  If no improvement will refer to GI  for colonoscopy  I discussed the assessment and treatment plan with the patient. The patient was provided an opportunity to ask questions and all were answered. The patient agreed with the plan and demonstrated an understanding of the instructions.   The patient was advised to call back or seek an in-person evaluation if the symptoms worsen or if the condition fails to improve as anticipated.   I spent 30 minutes dedicated to the care of this patient on the date of this encounter to include pre-visit review of his medical history,  non Face-to-face time with the patient , and post visit ordering of testing and therapeutics.    Crecencio Mc, MD

## 2021-11-26 NOTE — Patient Instructions (Addendum)
You can add up to 2000 mg of acetominophen (tylenol) every day safely  In divided doses (500 mg every 6 hours  Or 1000 mg every 12 hours.)   If you take meloxicam, you should take omeprazole daily to protect your stomach   Your diarrhea may be due to food intolerances or IBS.  I recommend you try using Bentyl before each meal  Your BP should be 140/90 or less.  Let me know if it is not   You can increase the trazodone to 100 mg and take 1000 mg tylenol before bedtime to help your pain.

## 2021-11-27 DIAGNOSIS — K589 Irritable bowel syndrome without diarrhea: Secondary | ICD-10-CM | POA: Insufficient documentation

## 2021-11-27 NOTE — Assessment & Plan Note (Addendum)
Managed with lamictal and wellbutrin  . Mood appears stable for the last 1-2 years. Continue follow up with provider at [facility]

## 2021-11-27 NOTE — Assessment & Plan Note (Signed)
She has failed to improve with PT ordered by Orthopedics and requests a second opinion .  Refilling meloxicam.  Referral to another provider in progress

## 2021-11-27 NOTE — Assessment & Plan Note (Signed)
Advised to increase trazodone to 100 mg qhs; reviewed bedtime hygiene

## 2021-11-27 NOTE — Assessment & Plan Note (Signed)
Well controlled on current regimen. Home readings are < 140/90. Renal function stable, no changes today.

## 2021-11-27 NOTE — Assessment & Plan Note (Signed)
Suggested by  History  No warning signs.  No colonoscopy report for reviewe (has been screening with cologuard for the last 6 years ). Trial of dicyclomine.  If no improvement will refer to GI  for colonoscopy

## 2021-11-28 ENCOUNTER — Other Ambulatory Visit: Payer: Self-pay | Admitting: Internal Medicine

## 2021-11-28 IMAGING — CR DG CHEST 2V
1 series · 2 of 2 positions shown · non-contrast
Comparison: None.

CLINICAL DATA: Post [id number].  Persistent cough.

EXAM:
CHEST - 2 VIEW

[Series 1: dg chest 2 view · 0.14mm/px · 2 of 2 slices shown]
[im 1/2]
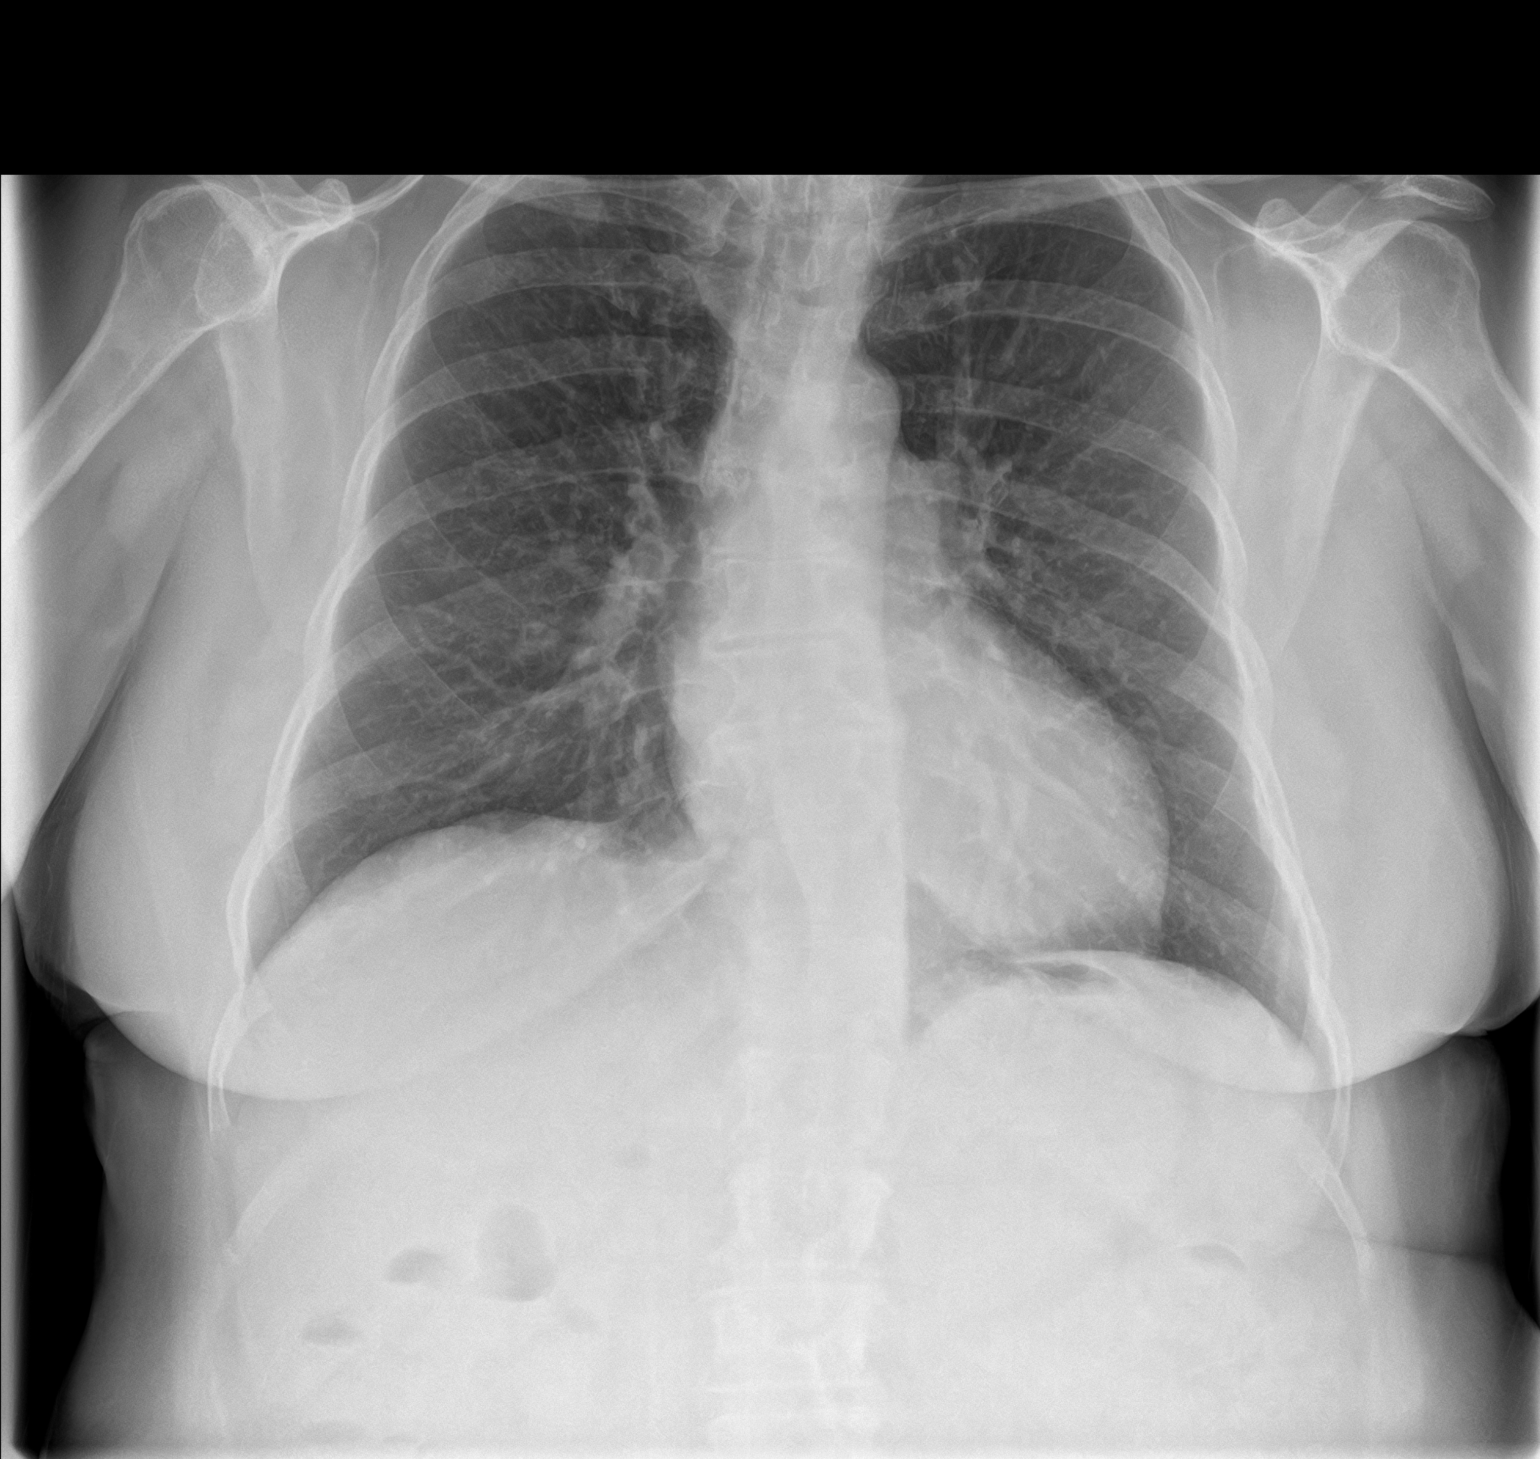
[im 2/2]
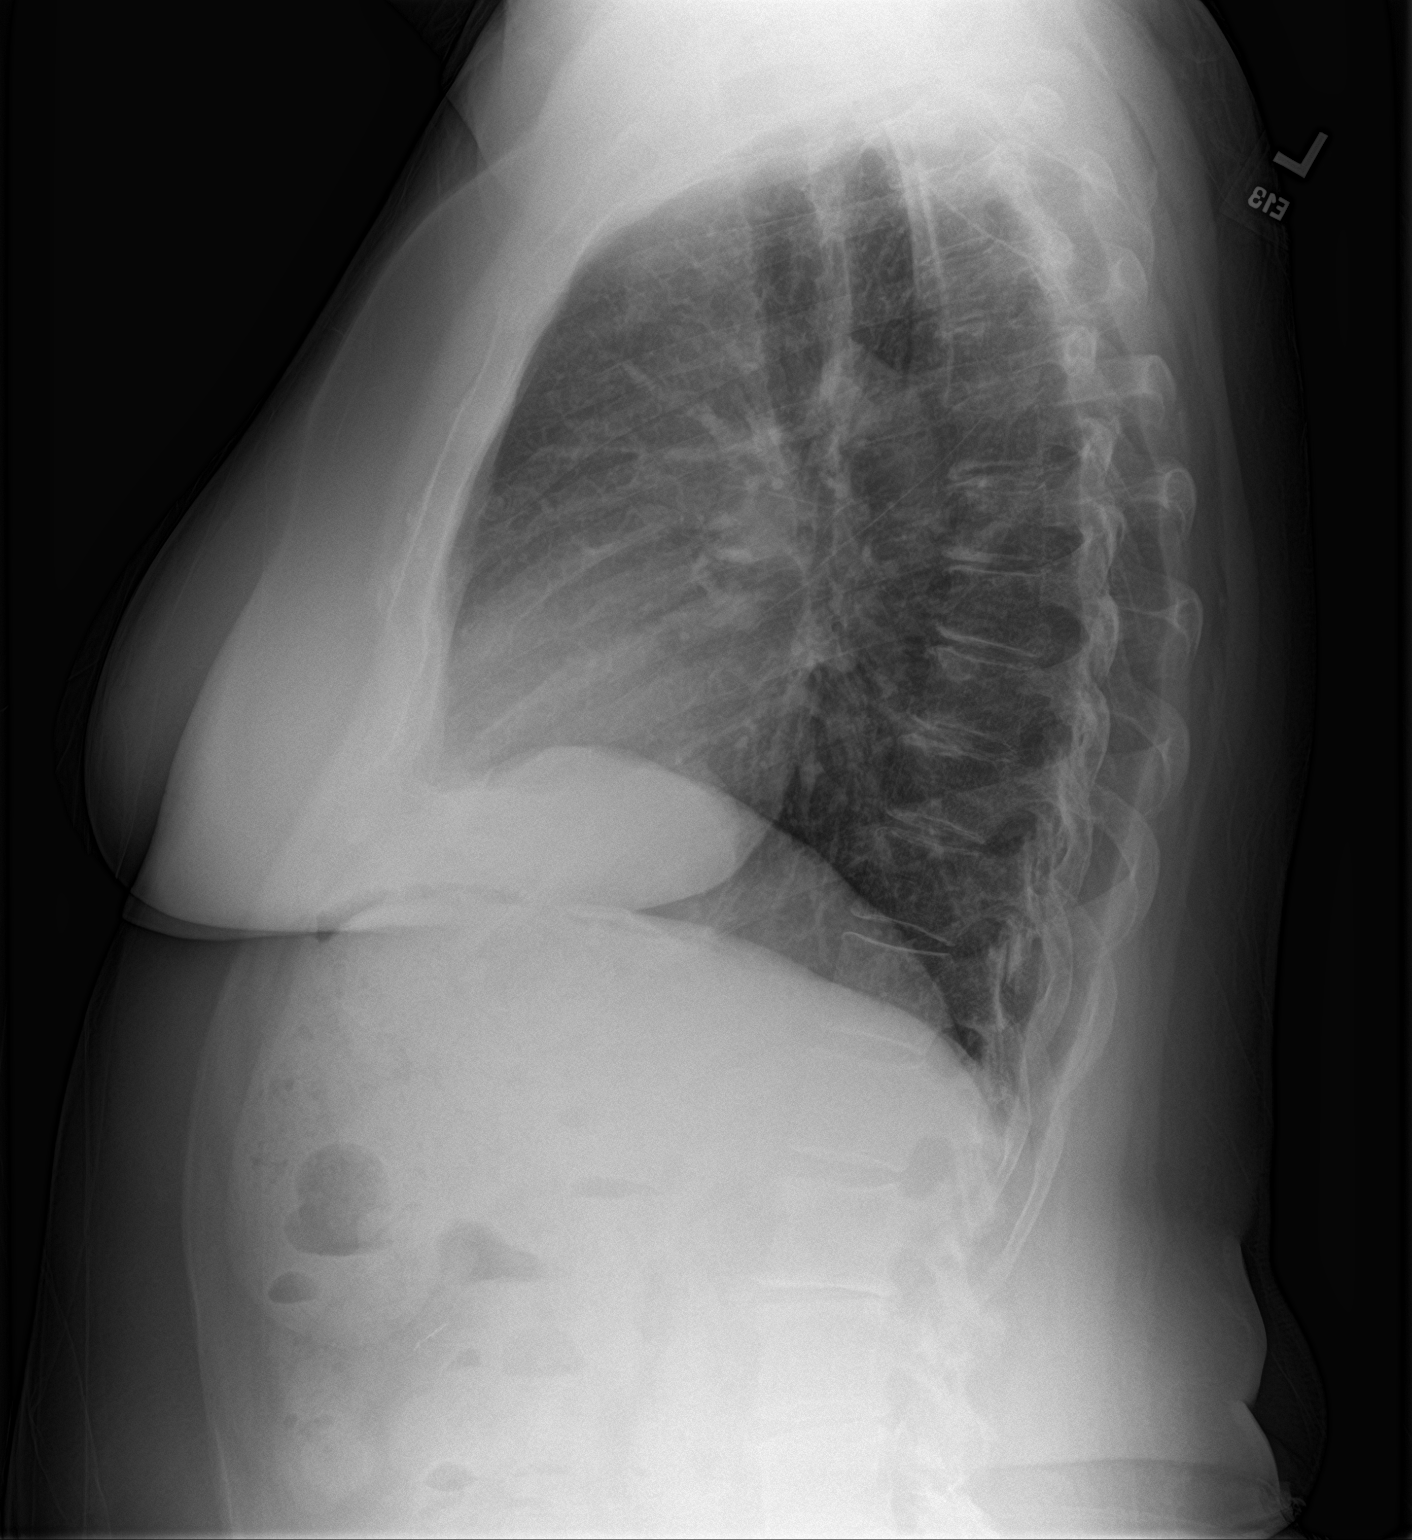

[2 of 2 positions shown; findings below may reference images not displayed]

FINDINGS: The heart size and mediastinal contours are within normal limits.
Both lungs are clear. The visualized skeletal structures are
unremarkable.
IMPRESSION: No active cardiopulmonary disease.

## 2021-12-01 ENCOUNTER — Other Ambulatory Visit (INDEPENDENT_AMBULATORY_CARE_PROVIDER_SITE_OTHER): Payer: Medicare (Managed Care)

## 2021-12-01 ENCOUNTER — Other Ambulatory Visit: Payer: Self-pay

## 2021-12-01 DIAGNOSIS — E785 Hyperlipidemia, unspecified: Secondary | ICD-10-CM | POA: Diagnosis not present

## 2021-12-01 DIAGNOSIS — R7301 Impaired fasting glucose: Secondary | ICD-10-CM

## 2021-12-01 DIAGNOSIS — I10 Essential (primary) hypertension: Secondary | ICD-10-CM

## 2021-12-01 DIAGNOSIS — R5383 Other fatigue: Secondary | ICD-10-CM | POA: Diagnosis not present

## 2021-12-01 LAB — LIPID PANEL
Cholesterol: 203 mg/dL — ABNORMAL HIGH (ref 0–200)
HDL: 48.8 mg/dL (ref >39.00)
LDL Cholesterol: 138 mg/dL — ABNORMAL HIGH (ref 0–99)
NonHDL: 154.2
Total CHOL/HDL Ratio: 4
Triglycerides: 83 mg/dL (ref 0.0–149.0)
VLDL: 16.6 mg/dL (ref 0.0–40.0)

## 2021-12-01 LAB — CBC WITH DIFFERENTIAL/PLATELET
Basophils Absolute: 0.1 10*3/uL (ref 0.0–0.1)
Basophils Relative: 1.3 % (ref 0.0–3.0)
Eosinophils Absolute: 0.1 10*3/uL (ref 0.0–0.7)
Eosinophils Relative: 2 % (ref 0.0–5.0)
HCT: 39.3 % (ref 36.0–46.0)
Hemoglobin: 13.1 g/dL (ref 12.0–15.0)
Lymphocytes Relative: 30.7 % (ref 12.0–46.0)
Lymphs Abs: 1.7 10*3/uL (ref 0.7–4.0)
MCHC: 33.4 g/dL (ref 30.0–36.0)
MCV: 90.9 fl (ref 78.0–100.0)
Monocytes Absolute: 0.4 10*3/uL (ref 0.1–1.0)
Monocytes Relative: 6.7 % (ref 3.0–12.0)
Neutro Abs: 3.4 10*3/uL (ref 1.4–7.7)
Neutrophils Relative %: 59.3 % (ref 43.0–77.0)
Platelets: 210 10*3/uL (ref 150.0–400.0)
RBC: 4.33 Mil/uL (ref 3.87–5.11)
RDW: 12.2 % (ref 11.5–15.5)
WBC: 5.7 10*3/uL (ref 4.0–10.5)

## 2021-12-01 LAB — COMPREHENSIVE METABOLIC PANEL
ALT: 13 U/L (ref 0–35)
AST: 16 U/L (ref 0–37)
Albumin: 4 g/dL (ref 3.5–5.2)
Alkaline Phosphatase: 53 U/L (ref 39–117)
BUN: 27 mg/dL — ABNORMAL HIGH (ref 6–23)
CO2: 28 mEq/L (ref 19–32)
Calcium: 9 mg/dL (ref 8.4–10.5)
Chloride: 102 mEq/L (ref 96–112)
Creatinine, Ser: 0.79 mg/dL (ref 0.40–1.20)
GFR: 76.03 mL/min (ref >60.00)
Glucose, Bld: 91 mg/dL (ref 70–99)
Potassium: 4.6 mEq/L (ref 3.5–5.1)
Sodium: 137 mEq/L (ref 135–145)
Total Bilirubin: 1.1 mg/dL (ref 0.2–1.2)
Total Protein: 6.4 g/dL (ref 6.0–8.3)

## 2021-12-01 LAB — HEMOGLOBIN A1C: Hgb A1c MFr Bld: 5 % (ref 4.6–6.5)

## 2021-12-01 LAB — TSH: TSH: 0.89 u[IU]/mL (ref 0.35–5.50)

## 2021-12-08 NOTE — Addendum Note (Signed)
Addended by: Leeanne Rio on: 12/08/2021 11:17 AM   Modules accepted: Orders

## 2021-12-20 ENCOUNTER — Other Ambulatory Visit: Payer: Self-pay | Admitting: Psychiatry

## 2021-12-20 DIAGNOSIS — F3176 Bipolar disorder, in full remission, most recent episode depressed: Secondary | ICD-10-CM

## 2021-12-23 ENCOUNTER — Other Ambulatory Visit: Payer: Self-pay | Admitting: Psychiatry

## 2021-12-23 DIAGNOSIS — F3176 Bipolar disorder, in full remission, most recent episode depressed: Secondary | ICD-10-CM

## 2022-01-05 DIAGNOSIS — F3170 Bipolar disorder, currently in remission, most recent episode unspecified: Secondary | ICD-10-CM | POA: Insufficient documentation

## 2022-01-05 DIAGNOSIS — G8929 Other chronic pain: Secondary | ICD-10-CM | POA: Diagnosis not present

## 2022-01-05 DIAGNOSIS — K580 Irritable bowel syndrome with diarrhea: Secondary | ICD-10-CM | POA: Diagnosis not present

## 2022-01-05 DIAGNOSIS — M1711 Unilateral primary osteoarthritis, right knee: Secondary | ICD-10-CM | POA: Insufficient documentation

## 2022-01-05 DIAGNOSIS — M25562 Pain in left knee: Secondary | ICD-10-CM | POA: Diagnosis not present

## 2022-01-05 DIAGNOSIS — M8589 Other specified disorders of bone density and structure, multiple sites: Secondary | ICD-10-CM | POA: Diagnosis not present

## 2022-01-05 DIAGNOSIS — E782 Mixed hyperlipidemia: Secondary | ICD-10-CM | POA: Diagnosis not present

## 2022-01-05 DIAGNOSIS — M25561 Pain in right knee: Secondary | ICD-10-CM | POA: Diagnosis not present

## 2022-01-05 DIAGNOSIS — F5101 Primary insomnia: Secondary | ICD-10-CM | POA: Diagnosis not present

## 2022-01-05 DIAGNOSIS — I10 Essential (primary) hypertension: Secondary | ICD-10-CM | POA: Diagnosis not present

## 2022-01-06 ENCOUNTER — Other Ambulatory Visit: Payer: Self-pay | Admitting: Internal Medicine

## 2022-01-18 ENCOUNTER — Encounter: Payer: Self-pay | Admitting: Psychiatry

## 2022-01-18 ENCOUNTER — Ambulatory Visit (INDEPENDENT_AMBULATORY_CARE_PROVIDER_SITE_OTHER): Payer: Medicare (Managed Care) | Admitting: Psychiatry

## 2022-01-18 ENCOUNTER — Other Ambulatory Visit: Payer: Self-pay

## 2022-01-18 VITALS — BP 169/96 | HR 74 | Temp 97.9°F | Wt 167.6 lb

## 2022-01-18 DIAGNOSIS — F3176 Bipolar disorder, in full remission, most recent episode depressed: Secondary | ICD-10-CM | POA: Diagnosis not present

## 2022-01-18 DIAGNOSIS — G4701 Insomnia due to medical condition: Secondary | ICD-10-CM

## 2022-01-18 MED ORDER — LAMOTRIGINE 25 MG PO TABS: 25.0000 mg | ORAL_TABLET | ORAL | 0 refills | Status: DC

## 2022-01-18 NOTE — Progress Notes (Signed)
[facility] MD OP Progress Note  01/18/2022 2:26 PM Julia Cooke  MRN:  [id number]  Chief Complaint:  Chief Complaint  Patient presents with   Follow-up : 71 year old Caucasian female who has a history of bipolar disorder, insomnia was evaluated in office today for medication management.   HPI: Julia Cooke is a 71 year old Caucasian female, married, on SSI, sent [place], has a history of bipolar disorder, insomnia was evaluated in office today.  Patient's last visit was on 03/27/2021.  Patient today reports she is currently stable.  Denies any significant mood lability, irritability, anxiety symptoms.  She stopped taking the Wellbutrin since she ran out several months ago.  Denies any worsening mood symptoms since being off of it.  Reports sleep is good.  She does have trazodone available which she takes as needed.  Patient reports she keeps herself busy.  She has been doing certain activities like knitting and that helps her.  Patient denies any suicidality, homicidality or perceptual disturbances.  Patient is interested in coming off of the Lamictal.  Discussed tapering off.  Patient denies any other concerns today.  Visit Diagnosis:    ICD-10-CM   1. Bipolar disorder, in full remission, most recent episode depressed (HCC)  F31.76 lamoTRIgine (LAMICTAL) 25 MG tablet    2. Insomnia due to medical condition  G47.01    mood, urinary frequency at night      Past Psychiatric History: Reviewed past psychiatric history from progress note on 05/02/2018.  Past Medical History:  Past Medical History:  Diagnosis Date   Bipolar disorder ([place])    takes Abilify   Depression    Dysrhythmia 10/2016   LBBB on ekg...nothing to compare it to.   Heart murmur    Hypertension    Nephrolithiasis 2000   s/p lithotripsy   Psychotic disorder ([place])    mania    Past Surgical History:  Procedure Laterality Date   EXTRACORPOREAL SHOCK WAVE LITHOTRIPSY Left 01/06/2017   Procedure: EXTRACORPOREAL  SHOCK WAVE LITHOTRIPSY (ESWL);  Surgeon: Hollice Espy, MD;  Location: [facility] ORS;  Service: Urology;  Laterality: Left;   LITHOTRIPSY  2000    Family Psychiatric History: Reviewed family psychiatric history from progress note on 05/02/2018.  Family History:  Family History  Problem Relation Age of Onset   Hyperlipidemia Mother    Stroke Mother    Hypertension Mother    Hyperlipidemia Father    Hypertension Father    Cancer Sister        breast   Breast cancer Sister 45   Heart disease Brother        myocardial infarction   Heart attack Brother    Hypertension Brother    Fibromyalgia Sister    Hypertension Sister    Heart Problems Sister    Hypertension Sister    Prostate cancer Neg Hx    Kidney cancer Neg Hx    Bladder Cancer Neg Hx     Social History: Reviewed social history from progress note from 05/02/2018. Social History   Socioeconomic History   Marital status: Married    Spouse name: richard   Number of children: 3   Years of education: Not on file   Highest education level: Some college, no degree  Occupational History    Comment: retired  Tobacco Use   Smoking status: Never   Smokeless tobacco: Never  Vaping Use   Vaping Use: Never used  Substance and Sexual Activity   Alcohol use: No  Alcohol/week: 0.0 - 2.0 standard drinks    Comment: 2-3 times weekly   Drug use: No   Sexual activity: Yes    Birth control/protection: Post-menopausal, None  Other Topics Concern   Not on file  Social History Narrative   Patient lives at home with her husband of 16 years. They have 3 adult children (2 boys, 1 girl). They recently moved back to [place] in January. 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   Social Determinants of Health   Financial Resource Strain: Low Risk    Difficulty of Paying Living Expenses: Not hard at all  Food Insecurity: No Food Insecurity   Worried About Sales executive in the Last Year: Never true   [place] in the Last Year: Never true  Transportation Needs: No Transportation Needs   Lack of Transportation (Medical): No   Lack of Transportation (Non-Medical): No  Physical Activity: Sufficiently Active   Days of Exercise per Week: 4 days   Minutes of Exercise per Session: 120 min  Stress: No Stress Concern Present   Feeling of Stress : Not at all  Social Connections: Socially Integrated   Frequency of Communication with Friends and Family: More than three times a week   Frequency of Social Gatherings with Friends and Family: More than three times a week   Attends Religious Services: More than 4 times per year   Active Member of [employer] or Organizations: Yes   Attends Archivist Meetings: More than 4 times per year   Marital Status: Married    Allergies:  Allergies  Allergen Reactions   Penicillins Swelling    As a child    Metabolic Disorder Labs: Lab Results  Component Value Date   HGBA1C 5.0 12/01/2021   MPG 94 01/20/2018   Lab Results  Component Value Date   PROLACTIN 3.2 (L) 01/20/2018   Lab Results  Component Value Date   CHOL 203 (H) 12/01/2021   TRIG 83.0 12/01/2021   HDL 48.80 12/01/2021   CHOLHDL 4 12/01/2021   VLDL 16.6 12/01/2021   LDLCALC 138 (H) 12/01/2021   LDLCALC 154 (H) 04/08/2021   Lab Results  Component Value Date   TSH 0.89 12/01/2021   TSH 0.95 04/08/2021    Therapeutic Level Labs: No results found for: LITHIUM No results found for: VALPROATE No components found for:  CBMZ  Current Medications: Current Outpatient Medications  Medication Sig Dispense Refill   cephALEXin (KEFLEX) 500 MG capsule Take 500 mg by mouth 4 (four) times daily.     dicyclomine (BENTYL) 10 MG capsule TAKE ONE CAPSULE BY MOUTH FOUR TIMES A DAY BEFORE MEALS AND AT BEDTIME 90 capsule 1   dicyclomine (BENTYL) 10 MG capsule Take by mouth.     erythromycin ophthalmic ointment SMARTSIG:0.5 Inch(es) In Eye(s) 3  Times Daily     ibuprofen (ADVIL) 600 MG tablet Take 600 mg by mouth every 8 (eight) hours as needed.     lamoTRIgine (LAMICTAL) 25 MG tablet Take 1 tablet (25 mg total) by mouth as directed. Take every other day and stop after a month 30 tablet 0   loratadine (CLARITIN) 10 MG tablet Take 10 mg by mouth daily.     losartan (COZAAR) 50 MG tablet TAKE ONE TABLET BY MOUTH DAILY 90 tablet 3   meloxicam (MOBIC) 15 MG tablet Take 1 tablet (15 mg total) by mouth  daily. 90 tablet 1   metoprolol succinate (TOPROL-XL) 25 MG 24 hr tablet TAKE ONE TABLET BY MOUTH DAILY 90 tablet 1   omeprazole (PRILOSEC) 20 MG capsule Take 1 capsule (20 mg total) by mouth daily. 30 capsule 3   traZODone (DESYREL) 50 MG tablet TAKE 1 AND 1/2 TABLETS  TO 2 TABLETS BY MOUTH AT BEDTIME AS NEEDED FOR SLEEP 75 tablet 1   No current facility-administered medications for this visit.     Musculoskeletal: Strength & Muscle Tone: within normal limits Gait & Station: normal Patient leans: N/A  Psychiatric Specialty Exam: Review of Systems  Psychiatric/Behavioral:  Negative for agitation, behavioral problems, confusion, decreased concentration, dysphoric mood, hallucinations, self-injury, sleep disturbance and suicidal ideas. The patient is not nervous/anxious and is not hyperactive.   All other systems reviewed and are negative.  Blood pressure (!) 169/96, pulse 74, temperature 97.9 F (36.6 C), temperature source Temporal, weight 167 lb 9.6 oz (76 kg).Body mass index is 32.73 kg/m.  General Appearance: Casual  Eye Contact:  Fair  Speech:  Clear and Coherent  Volume:  Normal  Mood:  Euthymic  Affect:  Congruent  Thought Process:  Goal Directed and Descriptions of Associations: Intact  Orientation:  Full (Time, Place, and Person)  Thought Content: Logical   Suicidal Thoughts:  No  Homicidal Thoughts:  No  Memory:  Immediate;   Fair Recent;   Fair Remote;   Fair  Judgement:  Fair  Insight:  Fair  Psychomotor  Activity:  Normal  Concentration:  Concentration: Fair and Attention Span: Fair  Recall:  [employer] of Knowledge: Fair  Language: Fair  Akathisia:  No  Handed:  Right  AIMS (if indicated): done, 0  Assets:  Communication Skills Desire for Improvement Housing Intimacy  ADL's:  Intact  Cognition: WNL  Sleep:  Fair   Screenings: [place] Office Visit from 03/27/2021 in [facility] Total Score 1      PHQ2-9    [place] Visit from 01/18/2022 in [place] Video Visit from 11/26/2021 in [facility] Office Visit from 05/19/2021 in [place] Office Visit from 04/08/2021 in [place] Office Visit from 03/27/2021 in [facility]  PHQ-2 Total Score 0 0 0 0 0  PHQ-9 Total Score -- -- 0 2 --      [place] Visit from 01/18/2022 in [place] Visit from 03/27/2021 in [place] ED from 01/05/2021 in [place] No Risk Error: Question 6 not populated No Risk        Assessment and Plan: Julia Cooke is a 71 year old Caucasian female who has a history of bipolar disorder was evaluated in office today.  Patient is currently stable.  Discussed tapering off of the Lamictal.  Plan as noted below.  Plan Bipolar disorder in remission Discontinue Wellbutrin for noncompliance. Patient advised to take Lamictal 25 mg p.o. every other day for the next 1 month and stop taking it if she tolerates tapering off well.  Insomnia - stable Patient does report interrupted sleep due to the urination at night. Continued sleep hygiene techniques Trazodone 75-100 mg p.o. nightly as needed  Patient with elevated blood pressure reading to follow up with primary care provider.   Follow-up in  clinic in 4 to 5 months or sooner in person.  Consent: Patient/Guardian gives verbal consent for treatment and assignment of benefits for services provided during this visit. Patient/Guardian expressed understanding and agreed to proceed.   This note was generated in part or whole with voice recognition software. Voice recognition is usually quite accurate but there are transcription errors that can and very often do occur. I apologize for any typographical errors that were not detected and corrected.      Ursula Alert, MD 01/18/2022, 2:26 PM

## 2022-01-30 ENCOUNTER — Other Ambulatory Visit: Payer: Self-pay | Admitting: Psychiatry

## 2022-01-30 DIAGNOSIS — F3176 Bipolar disorder, in full remission, most recent episode depressed: Secondary | ICD-10-CM

## 2022-02-02 DIAGNOSIS — J3489 Other specified disorders of nose and nasal sinuses: Secondary | ICD-10-CM | POA: Diagnosis not present

## 2022-02-02 DIAGNOSIS — L0100 Impetigo, unspecified: Secondary | ICD-10-CM | POA: Diagnosis not present

## 2022-02-04 ENCOUNTER — Telehealth: Payer: Self-pay

## 2022-02-04 DIAGNOSIS — F3176 Bipolar disorder, in full remission, most recent episode depressed: Secondary | ICD-10-CM

## 2022-02-04 MED ORDER — LAMOTRIGINE 25 MG PO TABS: 25.0000 mg | ORAL_TABLET | Freq: Every day | ORAL | 0 refills | Status: DC

## 2022-02-04 MED ORDER — TRAZODONE HCL 50 MG PO TABS: ORAL_TABLET | ORAL | 1 refills | Status: DC

## 2022-02-04 NOTE — Telephone Encounter (Signed)
I have sent trazodone and Lamictal to [facility]. ?

## 2022-02-04 NOTE — Telephone Encounter (Signed)
received fax requesting refills on the lamotrigine and trazodone.  ?

## 2022-02-04 NOTE — Telephone Encounter (Signed)
called patient to confirm pharmacy and pt stated that her insurance changed and so she had to [facility] to [facility] ?

## 2022-02-09 ENCOUNTER — Telehealth: Payer: Self-pay

## 2022-02-09 DIAGNOSIS — K219 Gastro-esophageal reflux disease without esophagitis: Secondary | ICD-10-CM

## 2022-02-09 DIAGNOSIS — G8929 Other chronic pain: Secondary | ICD-10-CM

## 2022-02-09 DIAGNOSIS — M25562 Pain in left knee: Secondary | ICD-10-CM

## 2022-02-09 DIAGNOSIS — I10 Essential (primary) hypertension: Secondary | ICD-10-CM

## 2022-02-09 DIAGNOSIS — E785 Hyperlipidemia, unspecified: Secondary | ICD-10-CM

## 2022-02-09 MED ORDER — METOPROLOL SUCCINATE ER 25 MG PO TB24: 25.0000 mg | ORAL_TABLET | Freq: Every day | ORAL | 1 refills | Status: DC

## 2022-02-09 MED ORDER — LOSARTAN POTASSIUM 50 MG PO TABS: 50.0000 mg | ORAL_TABLET | Freq: Every day | ORAL | 3 refills | Status: DC

## 2022-02-09 MED ORDER — OMEPRAZOLE 20 MG PO CPDR: 20.0000 mg | DELAYED_RELEASE_CAPSULE | Freq: Every day | ORAL | 3 refills | Status: DC

## 2022-02-09 MED ORDER — MELOXICAM 15 MG PO TABS: 15.0000 mg | ORAL_TABLET | Freq: Every day | ORAL | 1 refills | Status: DC

## 2022-02-10 NOTE — Telephone Encounter (Addendum)
Pt need refill on all medication [facility]. Pt is going to [place] clinic and Wayland Denis will be her provider in [place] ?Losartan, metoprolol, omeprazole, and meloxicam ?

## 2022-02-11 ENCOUNTER — Other Ambulatory Visit: Payer: Self-pay

## 2022-02-11 NOTE — Telephone Encounter (Signed)
Medications were refilled on 02/09/2022 and pt is aware.  ?

## 2022-02-17 ENCOUNTER — Telehealth: Payer: Self-pay | Admitting: Internal Medicine

## 2022-02-17 NOTE — Telephone Encounter (Signed)
Mail order pharmacy needs a new prescription for patient's dicyclomine (BENTYL) 10 MG capsule. Phone number is [phone], please send to [address] [place]  [address] ?

## 2022-02-17 NOTE — Telephone Encounter (Signed)
Pt is no longer under Dr. Lupita Dawn care.  ?

## 2022-02-27 ENCOUNTER — Other Ambulatory Visit: Payer: Self-pay | Admitting: Psychiatry

## 2022-02-27 DIAGNOSIS — F3176 Bipolar disorder, in full remission, most recent episode depressed: Secondary | ICD-10-CM

## 2022-03-16 ENCOUNTER — Other Ambulatory Visit: Payer: Self-pay | Admitting: Psychiatry

## 2022-03-16 DIAGNOSIS — F3176 Bipolar disorder, in full remission, most recent episode depressed: Secondary | ICD-10-CM

## 2022-03-20 ENCOUNTER — Encounter: Payer: Self-pay | Admitting: Emergency Medicine

## 2022-03-20 ENCOUNTER — Other Ambulatory Visit: Payer: Self-pay

## 2022-03-20 ENCOUNTER — Emergency Department
Admission: EM | Admit: 2022-03-20 | Discharge: 2022-03-20 | Disposition: A | Discharge status: Home / Self Care | Payer: Medicare (Managed Care) | Attending: Emergency Medicine | Admitting: Emergency Medicine

## 2022-03-20 DIAGNOSIS — S40861A Insect bite (nonvenomous) of right upper arm, initial encounter: Secondary | ICD-10-CM | POA: Insufficient documentation

## 2022-03-20 DIAGNOSIS — S40862A Insect bite (nonvenomous) of left upper arm, initial encounter: Secondary | ICD-10-CM | POA: Diagnosis not present

## 2022-03-20 DIAGNOSIS — W57XXXA Bitten or stung by nonvenomous insect and other nonvenomous arthropods, initial encounter: Secondary | ICD-10-CM | POA: Insufficient documentation

## 2022-03-20 DIAGNOSIS — S4991XA Unspecified injury of right shoulder and upper arm, initial encounter: Secondary | ICD-10-CM | POA: Diagnosis present

## 2022-03-20 MED ORDER — TRIAMCINOLONE ACETONIDE 0.1 % EX CREA: 1.0000 "application " | TOPICAL_CREAM | Freq: Four times a day (QID) | CUTANEOUS | 0 refills | Status: DC

## 2022-03-20 MED ORDER — PREDNISONE 50 MG PO TABS: 50.0000 mg | ORAL_TABLET | Freq: Every day | ORAL | 0 refills | Status: DC

## 2022-03-20 NOTE — ED Provider Notes (Signed)
? ?[facility] ?Provider Note ? ?Patient Contact: 4:35 PM (approximate) ? ? ?History  ? ?Insect Bite ? ? ?HPI ? ?Julia Cooke is a 71 y.o. female  Who presents to the emergency department with her husband for complaint of bug bites to both arms.  Patient states that she was outside weed eating when she believes she was bitten.  Patient did not feel any specific bite bites but had small vesicles with surrounding erythema and edema at the bite site.  Patient with no systemic complaints.  No nasal congestion, sore throat, oral swelling, shortness of breath, vomiting, diarrhea.  Patient states that she typically reacts strongly to her allergens, states that when she has poison ivy is typically significant requiring multiple forms of medication including shots.  Patient is concerned that symptoms may worsen.  Symptoms have been present for 24 hours without any acute worsening at this time. ?  ? ? ?Physical Exam  ? ?Triage Vital Signs: ?ED Triage Vitals  ?Enc Vitals Group  ?   BP 03/20/22 1555 (!) 168/74  ?   Pulse Rate 03/20/22 1555 69  ?   Resp 03/20/22 1555 18  ?   Temp 03/20/22 1555 97.8 ?F (36.6 ?C)  ?   Temp Source 03/20/22 1555 Oral  ?   SpO2 03/20/22 1555 99 %  ?   Weight 03/20/22 1601 163 lb (73.9 kg)  ?   Height 03/20/22 1601 '5\' 1"'$  (1.549 m)  ?   Head Circumference --   ?   Peak Flow --   ?   Pain Score 03/20/22 1601 0  ?   Pain Loc --   ?   Pain Edu? --   ?   Excl. in [place]? --   ? ? ?Most recent vital signs: ?Vitals:  ? 03/20/22 1555  ?BP: (!) 168/74  ?Pulse: 69  ?Resp: 18  ?Temp: 97.8 ?F (36.6 ?C)  ?SpO2: 99%  ? ? ? ?General: Alert and in no acute distress.  ?Cardiovascular:  Good peripheral perfusion ?Respiratory: Normal respiratory effort without tachypnea or retractions. Lungs CTAB.  ?Musculoskeletal: Full range of motion to all extremities.  Visualization of bilateral upper extremities reveals multiple erythematous lesions consistent with insect bite.  Patient has 4 small vesicles 1  to the left arm, 3 to the right arm at what appears to be at the bite sites.  There is no necrosis.  Excoriations noted from scratching. ?Neurologic:  No gross focal neurologic deficits are appreciated.  ?Skin:   No rash noted ?Other: ? ? ?ED Results / Procedures / Treatments  ? ?Labs ?(all labs ordered are listed, but only abnormal results are displayed) ?Labs Reviewed - No data to display ? ? ?EKG ? ? ? ? ?RADIOLOGY ? ? ? ?No results found. ? ?PROCEDURES: ? ?Critical Care performed: No ? ?Procedures ? ? ?MEDICATIONS ORDERED IN ED: ?Medications - No data to display ? ? ?IMPRESSION / MDM / ASSESSMENT AND PLAN / ED COURSE  ?I reviewed the triage vital signs and the nursing notes. ?             ?               ? ?Differential diagnosis includes, but is not limited to, insect bite, spider bite, allergic reaction, bullous impetigo ? ? ?Patient's diagnosis is consistent with insect bite.  Patient presented to the emergency department with erythematous, edematous insect bites with few scattered vesicles.  It appears that patient likely was  bitten by fire ants on physical exam.  No systemic reaction noted.  Patient will have prednisone and triamcinolone for symptom relief.  She may use Benadryl as well.  Concerning signs and symptoms are discussed with the patient.  Follow-up with pediatrician as needed..  Patient is given ED precautions to return to the ED for any worsening or new symptoms. ? ? ? ?  ? ? ?FINAL CLINICAL IMPRESSION(S) / ED DIAGNOSES  ? ?Final diagnoses:  ?Insect bite of right upper arm, initial encounter  ?Insect bite of left upper arm, initial encounter  ? ? ? ?Rx / DC Orders  ? ?ED Discharge Orders   ? ?      Ordered  ?  predniSONE (DELTASONE) 50 MG tablet  Daily with breakfast       ? 03/20/22 1657  ?  triamcinolone cream (KENALOG) 0.1 %  4 times daily       ? 03/20/22 1657  ? ?  ?  ? ?  ? ? ? ?Note:  This document was prepared using Dragon voice recognition software and may include unintentional  dictation errors. ?  ?Darletta Moll, PA-C ?03/20/22 1658 ? ?  ?Naaman Plummer, MD ?03/27/22 1259 ? ?

## 2022-03-20 NOTE — ED Triage Notes (Signed)
Pt via POV from home. Pt was out working in the yard yesterday. Pt has multiple insect bites to the bilateral arms. Redness noted. States that they are itchy, not painful. Pt is A&OX4 and NAD ?

## 2022-04-13 DIAGNOSIS — Z85828 Personal history of other malignant neoplasm of skin: Secondary | ICD-10-CM | POA: Diagnosis not present

## 2022-04-13 DIAGNOSIS — D2261 Melanocytic nevi of right upper limb, including shoulder: Secondary | ICD-10-CM | POA: Diagnosis not present

## 2022-04-13 DIAGNOSIS — S50862A Insect bite (nonvenomous) of left forearm, initial encounter: Secondary | ICD-10-CM | POA: Diagnosis not present

## 2022-04-13 DIAGNOSIS — D2262 Melanocytic nevi of left upper limb, including shoulder: Secondary | ICD-10-CM | POA: Diagnosis not present

## 2022-04-13 DIAGNOSIS — D2272 Melanocytic nevi of left lower limb, including hip: Secondary | ICD-10-CM | POA: Diagnosis not present

## 2022-05-03 ENCOUNTER — Ambulatory Visit: Payer: Medicare HMO

## 2022-05-28 DIAGNOSIS — E782 Mixed hyperlipidemia: Secondary | ICD-10-CM | POA: Diagnosis not present

## 2022-05-28 DIAGNOSIS — M8589 Other specified disorders of bone density and structure, multiple sites: Secondary | ICD-10-CM | POA: Diagnosis not present

## 2022-05-28 DIAGNOSIS — I10 Essential (primary) hypertension: Secondary | ICD-10-CM | POA: Diagnosis not present

## 2022-06-04 ENCOUNTER — Other Ambulatory Visit: Payer: Self-pay | Admitting: Student

## 2022-06-04 DIAGNOSIS — E782 Mixed hyperlipidemia: Secondary | ICD-10-CM | POA: Diagnosis not present

## 2022-06-04 DIAGNOSIS — I10 Essential (primary) hypertension: Secondary | ICD-10-CM | POA: Diagnosis not present

## 2022-06-04 DIAGNOSIS — M8589 Other specified disorders of bone density and structure, multiple sites: Secondary | ICD-10-CM | POA: Diagnosis not present

## 2022-06-04 DIAGNOSIS — Z1231 Encounter for screening mammogram for malignant neoplasm of breast: Secondary | ICD-10-CM | POA: Diagnosis not present

## 2022-06-04 DIAGNOSIS — F3170 Bipolar disorder, currently in remission, most recent episode unspecified: Secondary | ICD-10-CM | POA: Diagnosis not present

## 2022-06-04 DIAGNOSIS — Z0000 Encounter for general adult medical examination without abnormal findings: Secondary | ICD-10-CM | POA: Diagnosis not present

## 2022-06-04 DIAGNOSIS — M1711 Unilateral primary osteoarthritis, right knee: Secondary | ICD-10-CM | POA: Diagnosis not present

## 2022-06-04 DIAGNOSIS — K580 Irritable bowel syndrome with diarrhea: Secondary | ICD-10-CM | POA: Diagnosis not present

## 2022-06-04 DIAGNOSIS — K219 Gastro-esophageal reflux disease without esophagitis: Secondary | ICD-10-CM | POA: Diagnosis not present

## 2022-06-04 DIAGNOSIS — Z1211 Encounter for screening for malignant neoplasm of colon: Secondary | ICD-10-CM | POA: Diagnosis not present

## 2022-06-04 DIAGNOSIS — E559 Vitamin D deficiency, unspecified: Secondary | ICD-10-CM | POA: Diagnosis not present

## 2022-06-04 DIAGNOSIS — F5101 Primary insomnia: Secondary | ICD-10-CM | POA: Diagnosis not present

## 2022-06-14 DIAGNOSIS — M8588 Other specified disorders of bone density and structure, other site: Secondary | ICD-10-CM | POA: Diagnosis not present

## 2022-06-16 DIAGNOSIS — Z1211 Encounter for screening for malignant neoplasm of colon: Secondary | ICD-10-CM | POA: Diagnosis not present

## 2022-06-16 LAB — COLOGUARD: Cologuard: NEGATIVE

## 2022-06-17 ENCOUNTER — Encounter: Payer: Self-pay | Admitting: Psychiatry

## 2022-06-17 ENCOUNTER — Ambulatory Visit (INDEPENDENT_AMBULATORY_CARE_PROVIDER_SITE_OTHER): Payer: Medicare (Managed Care) | Admitting: Psychiatry

## 2022-06-17 VITALS — BP 144/74 | HR 83 | Temp 97.8°F | Wt 168.0 lb

## 2022-06-17 DIAGNOSIS — F3176 Bipolar disorder, in full remission, most recent episode depressed: Secondary | ICD-10-CM | POA: Diagnosis not present

## 2022-06-17 DIAGNOSIS — G4701 Insomnia due to medical condition: Secondary | ICD-10-CM

## 2022-06-17 MED ORDER — LAMOTRIGINE 25 MG PO TABS: 25.0000 mg | ORAL_TABLET | Freq: Every day | ORAL | 1 refills | Status: DC

## 2022-06-17 NOTE — Progress Notes (Signed)
[facility] MD OP Progress Note  06/17/2022 1:25 PM Julia Cooke  MRN:  [id number]  Chief Complaint:  Chief Complaint  Patient presents with   Follow-up: 71 year old Caucasian female who has a history of bipolar disorder, insomnia was evaluated in office today for medication management.   HPI: Julia Cooke is a 71 year old Caucasian female, married on SSI, lives in [place], has a history of bipolar disorder, insomnia was evaluated in office today.  Patient today reports she is currently doing well.  Denies any significant mood lability, depression or anxiety.  Reports occasional relationship struggles with spouse, usually triggered by him not being active as she would like him to be.  She has been trying to get him to do activities with her.  She is currently part of a ' life group ' and they do activities together like going to the gym a few times a week and doing others fun activities.  She also reports she has been doing a lot of yard work, Regulatory affairs officer vegetables and flowerbeds.  That also keeps her busy and happy.  Reports she is currently compliant on the Lamictal.  Denies side effects.  Denies any sleep problems.  The trazodone does help as needed.  Denies any suicidality, homicidality or perceptual disturbances.  Denies any other concerns today.  Visit Diagnosis:    ICD-10-CM   1. Bipolar disorder, in full remission, most recent episode depressed (HCC)  F31.76 lamoTRIgine (LAMICTAL) 25 MG tablet    2. Insomnia due to medical condition  G47.01    Mood ,urinary frequency at night      Past Psychiatric History: Reviewed past psychiatric history from progress note on 05/02/2018.  Past Medical History:  Past Medical History:  Diagnosis Date   Bipolar disorder ([place])    takes Abilify   Depression    Dysrhythmia 10/2016   LBBB on ekg...nothing to compare it to.   Heart murmur    Hypertension    Nephrolithiasis 2000   s/p lithotripsy   Psychotic disorder ([place])    mania    Past  Surgical History:  Procedure Laterality Date   EXTRACORPOREAL SHOCK WAVE LITHOTRIPSY Left 01/06/2017   Procedure: EXTRACORPOREAL SHOCK WAVE LITHOTRIPSY (ESWL);  Surgeon: Hollice Espy, MD;  Location: [facility] ORS;  Service: Urology;  Laterality: Left;   LITHOTRIPSY  2000    Family Psychiatric History: Reviewed family psychiatric history from progress note on 05/02/2018.  Family History:  Family History  Problem Relation Age of Onset   Hyperlipidemia Mother    Stroke Mother    Hypertension Mother    Hyperlipidemia Father    Hypertension Father    Cancer Sister        breast   Breast cancer Sister 18   Heart disease Brother        myocardial infarction   Heart attack Brother    Hypertension Brother    Fibromyalgia Sister    Hypertension Sister    Heart Problems Sister    Hypertension Sister    Prostate cancer Neg Hx    Kidney cancer Neg Hx    Bladder Cancer Neg Hx     Social History: Reviewed social history from progress note on 05/02/2018. Social History   Socioeconomic History   Marital status: Married    Spouse name: richard   Number of children: 3   Years of education: Not on file   Highest education level: Some college, no degree  Occupational History    Comment: retired  Tobacco Use  Smoking status: Never   Smokeless tobacco: Never  Vaping Use   Vaping Use: Never used  Substance and Sexual Activity   Alcohol use: No    Alcohol/week: 0.0 - 2.0 standard drinks of alcohol    Comment: 2-3 times weekly   Drug use: No   Sexual activity: Yes    Birth control/protection: Post-menopausal, None  Other Topics Concern   Not on file  Social History Narrative   Patient lives at home with her husband of 53 years. They have 3 adult children (2 boys, 1 girl). They recently moved back to [place] in January. 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   Social Determinants of Health   Financial Resource  Strain: Low Risk  (04/30/2021)   Overall Financial Resource Strain (CARDIA)    Difficulty of Paying Living Expenses: Not hard at all  Food Insecurity: No Food Insecurity (04/30/2021)   Hunger Vital Sign    Worried About Running Out of Food in the Last Year: Never true    Ran Out of Food in the Last Year: Never true  Transportation Needs: No Transportation Needs (04/30/2021)   PRAPARE - Hydrologist (Medical): No    Lack of Transportation (Non-Medical): No  Physical Activity: Sufficiently Active (04/30/2021)   Exercise Vital Sign    Days of Exercise per Week: 4 days    Minutes of Exercise per Session: 120 min  Stress: No Stress Concern Present (04/30/2021)   [place]    Feeling of Stress : Not at all  Social Connections: [place] (04/30/2021)   Social Connection and Isolation Panel [NHANES]    Frequency of Communication with Friends and Family: More than three times a week    Frequency of Social Gatherings with Friends and Family: More than three times a week    Attends Religious Services: More than 4 times per year    Active Member of [employer] or Organizations: Yes    Attends Music therapist: More than 4 times per year    Marital Status: Married    Allergies:  Allergies  Allergen Reactions   Penicillins Swelling    As a child    Metabolic Disorder Labs: Lab Results  Component Value Date   HGBA1C 5.0 12/01/2021   MPG 94 01/20/2018   Lab Results  Component Value Date   PROLACTIN 3.2 (L) 01/20/2018   Lab Results  Component Value Date   CHOL 203 (H) 12/01/2021   TRIG 83.0 12/01/2021   HDL 48.80 12/01/2021   CHOLHDL 4 12/01/2021   VLDL 16.6 12/01/2021   LDLCALC 138 (H) 12/01/2021   LDLCALC 154 (H) 04/08/2021   Lab Results  Component Value Date   TSH 0.89 12/01/2021   TSH 0.95 04/08/2021    Therapeutic Level Labs: No results found for: "LITHIUM" No results  found for: "VALPROATE" Lab Results  Component Value Date   CBMZ 2.9 (L) 01/07/2014    Current Medications: Current Outpatient Medications  Medication Sig Dispense Refill   atorvastatin (LIPITOR) 10 MG tablet Take 10 mg by mouth daily.     dicyclomine (BENTYL) 10 MG capsule Take by mouth.     loratadine (CLARITIN) 10 MG tablet Take 10 mg by mouth daily.     losartan (COZAAR) 50 MG tablet Take 1 tablet (50 mg total) by mouth daily. 90 tablet 3  meloxicam (MOBIC) 15 MG tablet Take 1 tablet (15 mg total) by mouth daily. 90 tablet 1   metoprolol succinate (TOPROL-XL) 25 MG 24 hr tablet Take 1 tablet (25 mg total) by mouth daily. 90 tablet 1   omeprazole (PRILOSEC) 20 MG capsule Take 1 capsule (20 mg total) by mouth daily. 30 capsule 3   traZODone (DESYREL) 50 MG tablet TAKE 1 AND 1/2 TABLETS  TO 2 TABLETS BY MOUTH AT BEDTIME AS NEEDED FOR SLEEP 180 tablet 1   triamcinolone cream (KENALOG) 0.1 % Apply 1 application. topically 4 (four) times daily. 30 g 0   cephALEXin (KEFLEX) 500 MG capsule Take 500 mg by mouth 4 (four) times daily. (Patient not taking: Reported on 06/17/2022)     lamoTRIgine (LAMICTAL) 25 MG tablet Take 1 tablet (25 mg total) by mouth daily. 90 tablet 1   No current facility-administered medications for this visit.     Musculoskeletal: Strength & Muscle Tone: within normal limits Gait & Station: normal Patient leans: N/A  Psychiatric Specialty Exam: Review of Systems  Psychiatric/Behavioral: Negative.    All other systems reviewed and are negative.   Blood pressure (!) 144/74, pulse 83, temperature 97.8 F (36.6 C), temperature source Temporal, weight 168 lb (76.2 kg).Body mass index is 31.74 kg/m.  General Appearance: Casual  Eye Contact:  Fair  Speech:  Clear and Coherent  Volume:  Normal  Mood:  Euthymic  Affect:  Full Range  Thought Process:  Goal Directed and Descriptions of Associations: Intact  Orientation:  Full (Time, Place, and Person)  Thought  Content: Logical   Suicidal Thoughts:  No  Homicidal Thoughts:  No  Memory:  Immediate;   Fair Recent;   Fair Remote;   Fair  Judgement:  Fair  Insight:  Fair  Psychomotor Activity:  Normal  Concentration:  Concentration: Fair and Attention Span: Fair  Recall:  [employer] of Knowledge: Fair  Language: Fair  Akathisia:  No  Handed:  Right  AIMS (if indicated): done  Assets:  Communication Skills Desire for Improvement Housing Social Support  ADL's:  Intact  Cognition: WNL  Sleep:  Fair   Screenings: [place] Office Visit from 06/17/2022 in [facility] Office Visit from 03/27/2021 in [facility] Total Score 0 [place] Office Visit from 06/17/2022 in [place]  Total GAD-7 Score 2      PHQ2-9    [place] Visit from 06/17/2022 in [facility] Visit from 01/18/2022 in [facility] Video Visit from 11/26/2021 in [facility] Office Visit from 05/19/2021 in [facility] Office Visit from 04/08/2021 in [facility]  PHQ-2 Total Score 0 0 0 0 0  PHQ-9 Total Score -- -- -- 0 2      [place] Visit from 06/17/2022 in [facility] ED from 03/20/2022 in [place] Office Visit from 01/18/2022 in [facility] No Risk No Risk No Risk        Assessment and Plan: Elizaveta Mattice is a 71 year old Caucasian female who has a history of bipolar disorder was evaluated in office today.  She is currently stable.  Plan as noted below.  Plan   Bipolar disorder in remission Lamictal 25 mg p.o. daily.  Insomnia-stable Continue  sleep hygiene techniques Trazodone 75-100 mg p.o. nightly as needed  Patient  with elevated blood pressure reading advised to follow up with primary care provider.  Currently compliant on her antihypertensive medication.  Follow-up in clinic in 5 to 6 months or sooner if needed.  This note was generated in part or whole with voice recognition software. Voice recognition is usually quite accurate but there are transcription errors that can and very often do occur. I apologize for any typographical errors that were not detected and corrected.     Ursula Alert, MD 06/17/2022, 1:25 PM

## 2022-06-23 LAB — COLOGUARD: COLOGUARD: NEGATIVE

## 2022-07-15 ENCOUNTER — Other Ambulatory Visit: Payer: Self-pay | Admitting: Internal Medicine

## 2022-07-15 DIAGNOSIS — G8929 Other chronic pain: Secondary | ICD-10-CM

## 2022-07-15 DIAGNOSIS — I10 Essential (primary) hypertension: Secondary | ICD-10-CM

## 2022-07-15 DIAGNOSIS — M25562 Pain in left knee: Secondary | ICD-10-CM

## 2022-07-15 DIAGNOSIS — E785 Hyperlipidemia, unspecified: Secondary | ICD-10-CM

## 2022-07-15 DIAGNOSIS — K219 Gastro-esophageal reflux disease without esophagitis: Secondary | ICD-10-CM

## 2022-10-26 ENCOUNTER — Ambulatory Visit (INDEPENDENT_AMBULATORY_CARE_PROVIDER_SITE_OTHER): Payer: Medicare (Managed Care) | Admitting: Psychiatry

## 2022-10-26 ENCOUNTER — Ambulatory Visit
Admission: RE | Admit: 2022-10-26 | Discharge: 2022-10-26 | Disposition: A | Discharge status: Home / Self Care | Payer: Medicare (Managed Care) | Source: Ambulatory Visit | Attending: Psychiatry | Admitting: Psychiatry

## 2022-10-26 ENCOUNTER — Encounter: Payer: Self-pay | Admitting: Psychiatry

## 2022-10-26 VITALS — BP 161/88 | HR 102 | Temp 97.3°F | Ht 61.0 in | Wt 171.6 lb

## 2022-10-26 DIAGNOSIS — I447 Left bundle-branch block, unspecified: Secondary | ICD-10-CM | POA: Insufficient documentation

## 2022-10-26 DIAGNOSIS — Z9189 Other specified personal risk factors, not elsewhere classified: Secondary | ICD-10-CM

## 2022-10-26 DIAGNOSIS — F3161 Bipolar disorder, current episode mixed, mild: Secondary | ICD-10-CM | POA: Diagnosis not present

## 2022-10-26 DIAGNOSIS — G4701 Insomnia due to medical condition: Secondary | ICD-10-CM

## 2022-10-26 MED ORDER — LAMOTRIGINE 25 MG PO TABS: ORAL_TABLET | ORAL | 0 refills | Status: DC

## 2022-10-26 NOTE — Progress Notes (Unsigned)
[facility] MD OP Progress Note  10/26/2022 3:43 PM [name]  MRN:  [id number]  Chief Complaint:  Chief Complaint  Patient presents with   Follow-up   Anxiety   Medication Refill   HPI: Julia Cooke is a 71 year old Caucasian female, married, on SSI, lives in [place], has a history of bipolar disorder, was evaluated in office today.  Patient as well as spouse, Julia Cooke presented to the session today.  Spouse provided collateral information.  Patient today appeared to be with pressured speech, tangential at times, anxious, tearful, reports she has been having these mood symptoms since the past few weeks.  Patient reports she has been talking to God a lot and she has been getting thoughts from God which she writes down.  Patient reports she writes down because the patient is able to get everything out since nobody is there to listen to her.  Patient reports she feels as though there is a warfare between good and evil going on inside her.  She gets thoughts from God as well as she gets thoughts that are evil.  Patient however denies having auditory hallucinations, reports these are just thoughts.  She reports she has always been religious, was raised a Nurse, learning disability.  Patient reports she has been taking trazodone only some nights.  Patient reports she stopped taking the Lamictal 2 to 3 days ago, ran out.  Patient denies any suicidality, homicidality.  Per spouse patient has been a lot anxious and has been having problems with her memory.  She seems to have short-term memory problems.  Patient has been worried about having dementia.  She had a visit with primary care provider this morning.  Patient per spouse also has been having sleep problems.  She wakes up at 3 AM in the morning texting people about God.  Per spouse he is concerned about her behavioral changes.  Reviewed notes per JuliaWayland Cooke -dated 10/26/2022-'patient with memory changes-labs ordered CBC, CMP, vitamin B12, urinalysis,  TSH.'  Will consider referral to neurology if memory does not improve.'     Visit Diagnosis:    ICD-10-CM   1. Bipolar 1 disorder, mixed, mild (HCC)  F31.61 lamoTRIgine (LAMICTAL) 25 MG tablet    EKG 12-Lead    2. At risk for long QT syndrome  Z91.89 EKG 12-Lead      Past Psychiatric History: Reviewed past psychiatric history from progress note on 05/02/2018.  Past Medical History:  Past Medical History:  Diagnosis Date   Bipolar disorder ([place])    takes Abilify   Depression    Dysrhythmia 10/2016   LBBB on ekg...nothing to compare it to.   Heart murmur    Hypertension    Nephrolithiasis 2000   s/p lithotripsy   Psychotic disorder ([place])    mania    Past Surgical History:  Procedure Laterality Date   EXTRACORPOREAL SHOCK WAVE LITHOTRIPSY Left 01/06/2017   Procedure: EXTRACORPOREAL SHOCK WAVE LITHOTRIPSY (ESWL);  Surgeon: Hollice Espy, MD;  Location: [facility] ORS;  Service: Urology;  Laterality: Left;   LITHOTRIPSY  2000    Family Psychiatric History: Reviewed family psychiatric history from progress note on 05/02/2018.  Family History:  Family History  Problem Relation Age of Onset   Hyperlipidemia Mother    Stroke Mother    Hypertension Mother    Hyperlipidemia Father    Hypertension Father    Cancer Sister        breast   Breast cancer Sister 10   Heart disease Brother  myocardial infarction   Heart attack Brother    Hypertension Brother    Fibromyalgia Sister    Hypertension Sister    Heart Problems Sister    Hypertension Sister    Prostate cancer Neg Hx    Kidney cancer Neg Hx    Bladder Cancer Neg Hx     Social History: Reviewed social history from progress note on 05/02/2018. Social History   Socioeconomic History   Marital status: Married    Spouse name: richard   Number of children: 3   Years of education: Not on file   Highest education level: Some college, no degree  Occupational History    Comment: retired  Tobacco Use   Smoking status:  Never   Smokeless tobacco: Never  Vaping Use   Vaping Use: Never used  Substance and Sexual Activity   Alcohol use: No    Alcohol/week: 0.0 - 2.0 standard drinks of alcohol    Comment: 2-3 times weekly   Drug use: No   Sexual activity: Yes    Birth control/protection: Post-menopausal, None  Other Topics Concern   Not on file  Social History Narrative   Patient lives at home with her husband of 36 years. They have 3 adult children (2 boys, 1 girl). They recently moved back to [place] in January. 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   Social Determinants of Health   Financial Resource Strain: Low Risk  (04/30/2021)   Overall Financial Resource Strain (CARDIA)    Difficulty of Paying Living Expenses: Not hard at all  Food Insecurity: No Food Insecurity (04/30/2021)   Hunger Vital Sign    Worried About Running Out of Food in the Last Year: Never true    Ran Out of Food in the Last Year: Never true  Transportation Needs: No Transportation Needs (04/30/2021)   PRAPARE - Hydrologist (Medical): No    Lack of Transportation (Non-Medical): No  Physical Activity: Sufficiently Active (04/30/2021)   Exercise Vital Sign    Days of Exercise per Week: 4 days    Minutes of Exercise per Session: 120 min  Stress: No Stress Concern Present (04/30/2021)   [place]    Feeling of Stress : Not at all  Social Connections: [place] (04/30/2021)   Social Connection and Isolation Panel [NHANES]    Frequency of Communication with Friends and Family: More than three times a week    Frequency of Social Gatherings with Friends and Family: More than three times a week    Attends Religious Services: More than 4 times per year    Active Member of [employer] or Organizations: Yes    Attends Music therapist: More than 4 times per  year    Marital Status: Married    Allergies:  Allergies  Allergen Reactions   Penicillins Swelling    As a child    Metabolic Disorder Labs: Lab Results  Component Value Date   HGBA1C 5.0 12/01/2021   MPG 94 01/20/2018   Lab Results  Component Value Date   PROLACTIN 3.2 (L) 01/20/2018   Lab Results  Component Value Date   CHOL 203 (H) 12/01/2021   TRIG 83.0 12/01/2021   HDL 48.80 12/01/2021   CHOLHDL 4 12/01/2021   VLDL 16.6 12/01/2021   LDLCALC 138 (H) 12/01/2021   LDLCALC 154 (H) 04/08/2021  Lab Results  Component Value Date   TSH 0.89 12/01/2021   TSH 0.95 04/08/2021    Therapeutic Level Labs: No results found for: "LITHIUM" No results found for: "VALPROATE" Lab Results  Component Value Date   CBMZ 2.9 (L) 01/07/2014    Current Medications: Current Outpatient Medications  Medication Sig Dispense Refill   acetaminophen-codeine (TYLENOL #3) 300-30 MG tablet Take by mouth.     atorvastatin (LIPITOR) 10 MG tablet Take 10 mg by mouth daily.     dicyclomine (BENTYL) 10 MG capsule Take by mouth.     lamoTRIgine (LAMICTAL) 25 MG tablet Take 1 tablet (25 mg total) by mouth daily. 90 tablet 1   lamoTRIgine (LAMICTAL) 25 MG tablet Take 1 tablet (25 mg total) by mouth daily for 15 days, THEN 1 tablet (25 mg total) 2 (two) times daily for 15 days. 45 tablet 0   meloxicam (MOBIC) 15 MG tablet TAKE 1 TABLET DAILY 90 tablet 3   omeprazole (PRILOSEC) 20 MG capsule Take 1 capsule (20 mg total) by mouth daily. 30 capsule 3   traZODone (DESYREL) 50 MG tablet TAKE 1 AND 1/2 TABLETS  TO 2 TABLETS BY MOUTH AT BEDTIME AS NEEDED FOR SLEEP 180 tablet 1   loratadine (CLARITIN) 10 MG tablet Take 10 mg by mouth daily. (Patient not taking: Reported on 10/26/2022)     losartan (COZAAR) 50 MG tablet Take 1 tablet (50 mg total) by mouth daily. 90 tablet 3   metoprolol succinate (TOPROL-XL) 25 MG 24 hr tablet Take 1 tablet (25 mg total) by mouth daily. (Patient not taking: Reported on  10/26/2022) 90 tablet 1   No current facility-administered medications for this visit.     Musculoskeletal: Strength & Muscle Tone: within normal limits Gait & Station: normal Patient leans: N/A  Psychiatric Specialty Exam: Review of Systems  Unable to perform ROS: Psychiatric disorder  All other systems reviewed and are negative.   Blood pressure (!) 161/88, pulse (!) 102, temperature (!) 97.3 F (36.3 C), temperature source Oral, height '5\' 1"'$  (1.549 m), weight 171 lb 9.6 oz (77.8 kg).Body mass index is 32.42 kg/m.  General Appearance: Casual  Eye Contact:  Fair  Speech:  Pressured  Volume:  Normal  Mood:  Anxious and Depressed  Affect:  Labile and Tearful  Thought Process:  Linear and Descriptions of Associations: Tangential  Orientation:  Other:  self, situation  Thought Content: Delusions and Rumination   Suicidal Thoughts:  No  Homicidal Thoughts:  No  Memory:  Immediate;   Fair Recent;   Fair Remote;   Fair  Judgement:  Fair  Insight:  Fair  Psychomotor Activity:  Restlessness  Concentration:  Concentration: Fair and Attention Span: Fair  Recall:  [employer] of Knowledge: Fair  Language: Fair  Akathisia:  No  Handed:  Right  AIMS (if indicated): not done  Assets:  Desire for Improvement Housing Social Support Transportation  ADL's:  Intact  Cognition: WNL  Sleep:  Poor   Screenings: [place] Office Visit from 06/17/2022 in [facility] Office Visit from 03/27/2021 in [facility] Total Score 0 1      [place] Office Visit from 06/17/2022 in [facility]  Total GAD-7 Score 2      PHQ2-9    [place] Visit from 06/17/2022 in [facility] Visit from 01/18/2022 in [facility]  Visit from 11/26/2021 in [facility] Office Visit from  05/19/2021 in [facility] Office Visit from 04/08/2021 in [facility]  PHQ-2 Total Score 0 0 0 0 0  PHQ-9 Total Score -- -- -- 0 2      [place] Visit from 06/17/2022 in [facility] from 03/20/2022 in [facility] Office Visit from 01/18/2022 in [facility] No Risk No Risk No Risk        Assessment and Plan: Lakima Dona is a 71 year old Caucasian female who has a history of bipolar disorder, currently with mood lability, sleep problems, possible delusions, will benefit from the following plan.  Plan Bipolar disorder type I mixed mild-unstable Restart lamotrigine 25 mg p.o. daily for 15 days and increase to lamotrigine 25 mg twice a day after that. Patient ran out of lamotrigine 2 to 3 days ago. Will consider adding Abilify or Seroquel once an EKG is obtained.   Insomnia-unstable Patient does have trazodone available, continue one half to 2 tablets at bedtime as needed.   At risk for prolonged QT syndrome-we will order EKG, provided phone #[phone].  Once I review the EKG will consider starting her on an atypical antipsychotic like Abilify or Seroquel.  Provided medication education.  Collateral information obtained from spouse as noted above.  I have reviewed notes per primary care provider Julia Cooke as noted above dated 10/18/2022.  Patient with elevated blood pressure reading-has been noncompliant on her metoprolol.  Patient to restart medication as well as follow up with primary care provider.  Follow-up in clinic in 2 to 4 weeks or sooner if needed.  I have communicated with staff to place this patient on a wait list for sooner appointment.  Crisis plan discussed with patient.  This note was generated in part or whole with voice recognition software. Voice recognition is usually quite accurate  but there are transcription errors that can and very often do occur. I apologize for any typographical errors that were not detected and corrected.    This note was generated in part or whole with voice recognition software. Voice recognition is usually quite accurate but there are transcription errors that can and very often do occur. I apologize for any typographical errors that were not detected and corrected.     Ursula Alert, MD 10/26/2022, 3:43 PM

## 2022-10-26 NOTE — Patient Instructions (Signed)
Please call for EKG - [phone]  Quetiapine Tablets What is this medication? QUETIAPINE (kwe TYE a peen) treats schizophrenia and bipolar disorder. It works by balancing the levels of dopamine and serotonin in your brain, hormones that help regulate mood, behaviors, and thoughts. It belongs to a group of medications called antipsychotics. Antipsychotic medications can be used to treat several kinds of mental health conditions. This medicine may be used for other purposes; ask your health care provider or pharmacist if you have questions. COMMON BRAND NAME(S): Seroquel What should I tell my care team before I take this medication? They need to know if you have any of these conditions: Blockage in your bowels Cataracts Constipation Dementia Diabetes Difficulty swallowing Glaucoma Heart disease High levels of prolactin History of breast cancer History of irregular heartbeat Liver disease Low blood cell levels (white cells, red cells, and platelets) Low blood pressure Parkinson disease Prostate disease Seizures Suicidal thoughts, plans, or attempt by you or a family member Thyroid disease Trouble passing urine An unusual or allergic reaction to quetiapine, other medications, foods, dyes, or preservatives Pregnant or trying to get pregnant Breastfeeding How should I use this medication? Take this medication by mouth with water. Take it as directed on the prescription label at the same time every day. You can take it with or without food. If it upsets your stomach, take it with food. Keep taking it unless your care team tells you to stop. A special MedGuide will be given to you by the pharmacist with each prescription and refill. Be sure to read this information carefully each time. Talk to your care team about the use of this medication in children. While this medication may be prescribed for children as young as 10 years for selected conditions, precautions do apply. People over  70 years of age may have a stronger reaction to this medication and need smaller doses. Overdosage: If you think you have taken too much of this medicine contact a poison control center or emergency room at once. NOTE: This medicine is only for you. Do not share this medicine with others. What if I miss a dose? If you miss a dose, take it as soon as you can. If it is almost time for your next dose, take only that dose. Do not take double or extra doses. What may interact with this medication? Do not take this medication with any of the following: Cisapride Dronedarone Metoclopramide Pimozide Thioridazine This medication may also interact with the following: Alcohol Antihistamines for allergy, cough, and cold Atropine Avasimibe Certain antivirals for HIV or hepatitis Certain medications for anxiety or sleep Certain medications for bladder problems, such as oxybutynin, tolterodine Certain medications for depression, such as amitriptyline, fluoxetine, nefazodone, sertraline Certain medications for fungal infections, such as fluconazole, ketoconazole, itraconazole, posaconazole Certain medications for stomach problems, such as dicyclomine, hyoscyamine Certain medications for travel sickness, such as scopolamine Cimetidine General anesthetics, such as halothane, isoflurane, methoxyflurane, propofol Ipratropium Levodopa or other medications for Parkinson disease Medications for blood pressure Medications for seizures Medications that relax muscles for surgery Opioid medications for pain Other medications that cause heart rhythm changes Phenothiazines, such as chlorpromazine, prochlorperazine Rifampin St. John's wort This list may not describe all possible interactions. Give your health care provider a list of all the medicines, herbs, non-prescription drugs, or dietary supplements you use. Also tell them if you smoke, drink alcohol, or use illegal drugs. Some items may interact with  your medicine. What should I watch for while using  this medication? Visit your care team for regular checks on your progress. Tell your care team if your symptoms do not start to get better or if they get worse. Do not suddenly stop taking This medication. You may develop a severe reaction. Your care team will tell you how much medication to take. If your care team wants you to stop the medication, the dose may be slowly lowered over time to avoid any side effects. You may need to have an eye exam before and during use of this medication. This medication may increase blood sugar. Ask your care team if changes in diet or medications are needed if you have diabetes. This medication may cause thoughts of suicide or depression. This includes sudden changes in mood, behaviors, or thoughts. These changes can happen at any time but are more common in the beginning of treatment or after a change in dose. Call your care team right away if you experience these thoughts or worsening depression. This medication may affect your coordination, reaction time, or judgment. Do not drive or operate machinery until you know how this medication affects you. Sit up or stand slowly to reduce the risk of dizzy or fainting spells. Drinking alcohol with this medication can increase the risk of these side effects. This medication can cause problems with controlling your body temperature. It can lower the response of your body to cold temperatures. If possible, stay indoors during cold weather. If you must go outdoors, wear warm clothes. It can also lower the response of your body to heat. Do not overheat. Do not over-exercise. Stay out of the sun when possible. If you must be in the sun, wear cool clothing. Drink plenty of water. If you have trouble controlling your body temperature, call your care team right away. What side effects may I notice from receiving this medication? Side effects that you should report to your care team as  soon as possible: Allergic reactions--skin rash, itching, hives, swelling of the face, lips, tongue, or throat Heart rhythm changes--fast or irregular heartbeat, dizziness, feeling faint or lightheaded, chest pain, trouble breathing High blood sugar (hyperglycemia)--increased thirst or amount of urine, unusual weakness or fatigue, blurry vision High fever, stiff muscles, increased sweating, fast or irregular heartbeat, and confusion, which may be signs of neuroleptic malignant syndrome High prolactin level--unexpected breast tissue growth, discharge from the nipple, change in sex drive or performance, irregular menstrual cycle Increase in blood pressure in children Infection--fever, chills, cough, or sore throat Low blood pressure--dizziness, feeling faint or lightheaded, blurry vision Low thyroid levels (hypothyroidism)--unusual weakness or fatigue, increased sensitivity to cold, constipation, hair loss, dry skin, weight gain, feelings of depression Pain or trouble swallowing Seizures Stroke--sudden numbness or weakness of the face, arm, or leg, trouble speaking, confusion, trouble walking, loss of balance or coordination, dizziness, severe headache, change in vision Sudden eye pain or change in vision such as blurry vision, seeing halos around lights, vision loss Thoughts of suicide or self-harm, worsening mood, feelings of depression Trouble passing urine Uncontrolled and repetitive body movements, muscle stiffness or spasms, tremors or shaking, loss of balance or coordination, restlessness, shuffling walk, which may be signs of extrapyramidal symptoms (EPS) Side effects that usually do not require medical attention (report to your care team if they continue or are bothersome): Constipation Dizziness Drowsiness Dry mouth Weight gain This list may not describe all possible side effects. Call your doctor for medical advice about side effects. You may report side effects to FDA at  1-800-FDA-1088.  Where should I keep my medication? Keep out of the reach of children. Store at room temperature between 15 and 30 degrees C (59 and 86 degrees F). Throw away any unused medication after the expiration date. NOTE: This sheet is a summary. It may not cover all possible information. If you have questions about this medicine, talk to your doctor, pharmacist, or health care provider.  2023 Elsevier/Gold Standard (2021-10-16 00:00:00)

## 2022-10-27 ENCOUNTER — Telehealth: Payer: Self-pay | Admitting: Psychiatry

## 2022-10-27 DIAGNOSIS — F3161 Bipolar disorder, current episode mixed, mild: Secondary | ICD-10-CM

## 2022-10-27 MED ORDER — QUETIAPINE FUMARATE 25 MG PO TABS: 25.0000 mg | ORAL_TABLET | Freq: Every day | ORAL | 1 refills | Status: DC

## 2022-10-27 NOTE — Telephone Encounter (Signed)
Spoke to patient, discussed EKG-left bundle branch block-[id number].  Will start Seroquel 25 mg p.o. nightly.  Patient to monitor for any side effects.  Patient to contact primary care provider to discuss further about her EKG.

## 2022-10-28 ENCOUNTER — Telehealth: Payer: Self-pay | Admitting: Psychiatry

## 2022-10-28 DIAGNOSIS — F3161 Bipolar disorder, current episode mixed, mild: Secondary | ICD-10-CM

## 2022-10-28 MED ORDER — ARIPIPRAZOLE 2 MG PO TABS: 2.0000 mg | ORAL_TABLET | Freq: Every morning | ORAL | 1 refills | Status: DC

## 2022-10-28 NOTE — Telephone Encounter (Signed)
Patient came into the office during lunch requesting to speak with Dr Shea Evans. She has questions about her medications. She is afraid of taking the Quetiapine with her other medications due to having blackout spells.  The cardiologist cannot see her until Jan 16th.  Patient's husbands # [phone]  Patients # [phone]

## 2022-10-28 NOTE — Telephone Encounter (Signed)
Return call to patient.  She reports she is concerned about side effects of quetiapine and prefers to go back on the Abilify.  Discussed with patient Abilify and quetiapine are in the same class.  Patient would like to go back on the Abilify.  Discussed with patient that she could be started back on Abilify 2 mg daily in the morning.  She could go back on the trazodone for sleep.  I have send Abilify to her [facility] pharmacy.

## 2022-11-05 NOTE — Progress Notes (Unsigned)
11/08/2022 2:53 PM   [name] December 03, 1950 [id number]  Referring provider: Wayland Denis, PA-C [address] [place],  [place] [address]  Urological history: 1. Urethral diverticulum -Incidental finding of a left-sided urethral diverticulum on a CT urogram in November 2020  2.  Nephrolithiasis -Stone composition of stone oxalate/phosphate -left ESWL (2018) -Spontaneous passage of a right UVJ stone in 2020 -Multiple bilateral ureteral calculi seen on CT urogram from November 2020  3. High risk hematuria -non-smoker -CT urogram (09/2019) -cysto - not completed as hematuria was related to stones -no reports of gross heme -UA negative for micro heme   Chief Complaint  Patient presents with   Urinary Tract Infection         HPI: Justin Buechner is a 71 y.o. female who presents today for back pain, urinary frequency and dysuria.    She has pressured speech and is tangential in thought.    She is having urinary frequency for the last two to three months.  She is having SUI for one month.  She had some burning a couple days ago, but she doesn't feel it today.  Patient denies any modifying or aggravating factors.  Patient denies any gross hematuria, dysuria or suprapubic/flank pain.  Patient denies any fevers, chills, nausea or vomiting.    She also has been coughing and now has left upper back pain.    UA yellow clear, specific gravity 1.010, pH 5.5, trace leukocytes, 0-5 squamous epithelial cells, 0-5 WBCs, 0-5 RBCs and rare bacteria.  KUB (10/2022) small bilateral renal calculi and overall similar pattern of pelvic phleboliths compared to the x-ray 10/10/2019.  Her BP was 199/123 and her pulse was 108 when she arrived for her appointment.  She stated she had not taken his BP meds.    PMH: Past Medical History:  Diagnosis Date   Bipolar disorder ([place])    takes Abilify   Depression    Dysrhythmia 10/2016   LBBB on ekg...nothing to compare it  to.   Heart murmur    Hypertension    Nephrolithiasis 2000   s/p lithotripsy   Psychotic disorder ([place])    mania    Surgical History: Past Surgical History:  Procedure Laterality Date   EXTRACORPOREAL SHOCK WAVE LITHOTRIPSY Left 01/06/2017   Procedure: EXTRACORPOREAL SHOCK WAVE LITHOTRIPSY (ESWL);  Surgeon: Hollice Espy, MD;  Location: [facility] ORS;  Service: Urology;  Laterality: Left;   LITHOTRIPSY  2000    Home Medications:  Allergies as of 11/08/2022       Reactions   Penicillins Swelling   As a child        Medication List        Accurate as of November 08, 2022  2:53 PM. If you have any questions, ask your nurse or doctor.          STOP taking these medications    acetaminophen-codeine 300-30 MG tablet Commonly known as: TYLENOL #3 Stopped by: Dequarius Jeffries, PA-C   loratadine 10 MG tablet Commonly known as: CLARITIN Stopped by: [name], PA-C       TAKE these medications    ARIPiprazole 2 MG tablet Commonly known as: Abilify Take 1 tablet (2 mg total) by mouth in the morning.   atorvastatin 10 MG tablet Commonly known as: LIPITOR Take 10 mg by mouth daily.   dicyclomine 10 MG capsule Commonly known as: BENTYL Take by mouth.   lamoTRIgine 25 MG tablet Commonly known as: LAMICTAL Take 1  tablet (25 mg total) by mouth daily. What changed: Another medication with the same name was removed. Continue taking this medication, and follow the directions you see here. Changed by: [name], PA-C   losartan 50 MG tablet Commonly known as: COZAAR Take 1 tablet (50 mg total) by mouth daily.   meloxicam 15 MG tablet Commonly known as: MOBIC TAKE 1 TABLET DAILY   metoprolol succinate 25 MG 24 hr tablet Commonly known as: TOPROL-XL Take 1 tablet (25 mg total) by mouth daily.   omeprazole 20 MG capsule Commonly known as: PRILOSEC Take 1 capsule (20 mg total) by mouth daily.   traZODone 50 MG tablet Commonly known as: DESYREL TAKE 1  AND 1/2 TABLETS  TO 2 TABLETS BY MOUTH AT BEDTIME AS NEEDED FOR SLEEP        Allergies:  Allergies  Allergen Reactions   Penicillins Swelling    As a child    Family History: Family History  Problem Relation Age of Onset   Hyperlipidemia Mother    Stroke Mother    Hypertension Mother    Hyperlipidemia Father    Hypertension Father    Cancer Sister        breast   Breast cancer Sister 71   Heart disease Brother        myocardial infarction   Heart attack Brother    Hypertension Brother    Fibromyalgia Sister    Hypertension Sister    Heart Problems Sister    Hypertension Sister    Prostate cancer Neg Hx    Kidney cancer Neg Hx    Bladder Cancer Neg Hx     Social History:  reports that she has never smoked. She has never used smokeless tobacco. She reports that she does not drink alcohol and does not use drugs.  ROS: Pertinent ROS in HPI  Physical Exam: BP (!) 175/92 (BP Location: Left Arm, Patient Position: Sitting, Cuff Size: Normal)   Pulse (!) 104   Ht 5' (1.524 m)   Wt 167 lb 9.6 oz (76 kg)   BMI 32.73 kg/m   Constitutional:  Well nourished. Alert and oriented, No acute distress. HEENT:  AT, moist mucus membranes.  Trachea midline, no masses. Cardiovascular: No clubbing, cyanosis, or edema. Respiratory: Normal respiratory effort, no increased work of breathing. GU: No CVA tenderness.  No bladder fullness or masses. Vulvovaginal atrophy w/ pallor, loss of rugae, introital retraction, excoriations.  Vulvar thinning, fusion of labia, clitoral hood retraction, prominent urethral meatus.   Urethral diverticulum not appreciated on today's exam.  No bladder fullness, tenderness or masses. Poor pelvic support, grade I cystocele and grade II rectocele noted.  Anus and perineum are without rashes or lesions.    Neurologic: Grossly intact, no focal deficits, moving all 4 extremities. Psychiatric: Normal mood and affect.    Laboratory Data: Lab Results  Component  Value Date   WBC 5.7 12/01/2021   HGB 13.1 12/01/2021   HCT 39.3 12/01/2021   MCV 90.9 12/01/2021   PLT 210.0 12/01/2021    Lab Results  Component Value Date   CREATININE 0.79 12/01/2021    Lab Results  Component Value Date   HGBA1C 5.0 12/01/2021    Lab Results  Component Value Date   TSH 0.89 12/01/2021       Component Value Date/Time   CHOL 203 (H) 12/01/2021 1105   HDL 48.80 12/01/2021 1105   CHOLHDL 4 12/01/2021 1105   VLDL 16.6 12/01/2021 1105   LDLCALC 138 (  H) 12/01/2021 1105    Lab Results  Component Value Date   AST 16 12/01/2021   Lab Results  Component Value Date   ALT 13 12/01/2021    Urinalysis See EPIC and HPI I have reviewed the labs.   Pertinent Imaging: Narrative & Impression  CLINICAL DATA:  History of kidney stones. Left flank pain. Discolored urine.   EXAM: ABDOMEN - 1 VIEW   COMPARISON:  Abdomen 10/10/2019.   FINDINGS: Soft tissue structures are unremarkable. No bowel distention. Multiple bilateral small renal stones are again noted. Multiple pelvic calcifications consistent with phleboliths again noted. A distal ureteral stone cannot be excluded. Similar finding noted on prior exam. Lumbar scoliosis concave left. Diffuse degenerative change lumbar spine and both hips.   IMPRESSION: Multiple bilateral small renal stones are again noted. Multiple pelvic calcifications consistent with phleboliths again noted. A distal ureteral stone cannot be excluded. Similar finding noted on prior exam.     Electronically Signed   By: Marcello Moores  Register M.D.   On: 11/09/2022 06:53   I have independently reviewed the films.    Assessment & Plan:    1. Frequency -UA benign -urine sent for culture to rule out subclinical infection -Her increasing urinary frequency may be more related to cardiac issue and/or dietary as she states she has increased her fluid intake  2.  Urethral diverticulum -not appreciated on today's exam   3.  Hypertensive crisis -Fortunately her repeat vitals showed a decrease in her blood pressure to a more acceptable level -I explained to her what a hypertensive crisis meant and that put her at increased risk for stroke and not to forego taking blood pressure medication continue to monitor at home  Return for Pending urine culture results .  These notes generated with voice recognition software. I apologize for typographical errors.  [place], [place] [address]  [place], [place] [address] [phone]

## 2022-11-08 ENCOUNTER — Encounter: Payer: Self-pay | Admitting: Urology

## 2022-11-08 ENCOUNTER — Ambulatory Visit
Admission: RE | Admit: 2022-11-08 | Discharge: 2022-11-08 | Disposition: A | Discharge status: Home / Self Care | Payer: Medicare (Managed Care) | Attending: Urology | Admitting: Urology

## 2022-11-08 ENCOUNTER — Ambulatory Visit
Admission: RE | Admit: 2022-11-08 | Discharge: 2022-11-08 | Disposition: A | Discharge status: Home / Self Care | Payer: Medicare (Managed Care) | Source: Ambulatory Visit | Attending: Urology | Admitting: Urology

## 2022-11-08 ENCOUNTER — Other Ambulatory Visit
Admission: RE | Admit: 2022-11-08 | Discharge: 2022-11-08 | Disposition: A | Discharge status: Home / Self Care | Payer: Medicare (Managed Care) | Source: Home / Self Care | Attending: Urology | Admitting: Urology

## 2022-11-08 ENCOUNTER — Ambulatory Visit (INDEPENDENT_AMBULATORY_CARE_PROVIDER_SITE_OTHER): Payer: Medicare (Managed Care) | Admitting: Urology

## 2022-11-08 ENCOUNTER — Other Ambulatory Visit: Payer: Self-pay

## 2022-11-08 VITALS — BP 175/92 | HR 104 | Ht 60.0 in | Wt 167.6 lb

## 2022-11-08 DIAGNOSIS — N201 Calculus of ureter: Secondary | ICD-10-CM

## 2022-11-08 DIAGNOSIS — I169 Hypertensive crisis, unspecified: Secondary | ICD-10-CM | POA: Diagnosis not present

## 2022-11-08 DIAGNOSIS — R300 Dysuria: Secondary | ICD-10-CM

## 2022-11-08 DIAGNOSIS — R350 Frequency of micturition: Secondary | ICD-10-CM | POA: Diagnosis not present

## 2022-11-08 DIAGNOSIS — N361 Urethral diverticulum: Secondary | ICD-10-CM | POA: Diagnosis not present

## 2022-11-08 LAB — URINALYSIS, COMPLETE (UACMP) WITH MICROSCOPIC
Bilirubin Urine: NEGATIVE
Glucose, UA: NEGATIVE mg/dL
Hgb urine dipstick: NEGATIVE
Ketones, ur: NEGATIVE mg/dL
Nitrite: NEGATIVE
Protein, ur: NEGATIVE mg/dL
Specific Gravity, Urine: 1.01 (ref 1.005–1.030)
pH: 5.5 (ref 5.0–8.0)

## 2022-11-11 ENCOUNTER — Encounter: Payer: Self-pay | Admitting: Cardiovascular Disease

## 2022-11-11 ENCOUNTER — Telehealth: Payer: Self-pay

## 2022-11-11 ENCOUNTER — Ambulatory Visit: Payer: Medicare (Managed Care) | Attending: Cardiovascular Disease | Admitting: Cardiovascular Disease

## 2022-11-11 VITALS — BP 180/100 | HR 89 | Ht 60.0 in | Wt 170.0 lb

## 2022-11-11 DIAGNOSIS — E785 Hyperlipidemia, unspecified: Secondary | ICD-10-CM | POA: Diagnosis not present

## 2022-11-11 DIAGNOSIS — M25562 Pain in left knee: Secondary | ICD-10-CM | POA: Diagnosis not present

## 2022-11-11 DIAGNOSIS — R072 Precordial pain: Secondary | ICD-10-CM

## 2022-11-11 DIAGNOSIS — G8929 Other chronic pain: Secondary | ICD-10-CM

## 2022-11-11 DIAGNOSIS — R0602 Shortness of breath: Secondary | ICD-10-CM | POA: Diagnosis not present

## 2022-11-11 DIAGNOSIS — K219 Gastro-esophageal reflux disease without esophagitis: Secondary | ICD-10-CM

## 2022-11-11 DIAGNOSIS — I10 Essential (primary) hypertension: Secondary | ICD-10-CM | POA: Diagnosis not present

## 2022-11-11 DIAGNOSIS — M25561 Pain in right knee: Secondary | ICD-10-CM

## 2022-11-11 LAB — URINE CULTURE: Culture: 10000 — AB

## 2022-11-11 MED ORDER — LOSARTAN POTASSIUM 100 MG PO TABS: 100.0000 mg | ORAL_TABLET | Freq: Every day | ORAL | 0 refills | Status: DC

## 2022-11-11 MED ORDER — METOPROLOL TARTRATE 100 MG PO TABS: ORAL_TABLET | ORAL | 0 refills | Status: DC

## 2022-11-11 MED ORDER — LOSARTAN POTASSIUM 100 MG PO TABS: 100.0000 mg | ORAL_TABLET | Freq: Every day | ORAL | 3 refills | Status: DC

## 2022-11-11 MED ORDER — SULFAMETHOXAZOLE-TRIMETHOPRIM 800-160 MG PO TABS: 1.0000 | ORAL_TABLET | Freq: Two times a day (BID) | ORAL | 0 refills | Status: DC

## 2022-11-11 NOTE — Progress Notes (Signed)
Cardiology Office Note   Date:  11/11/2022   ID:  Julia Cooke, DOB 1951-10-22, MRN [id number]  PCP:  Wayland Denis, PA-C  Cardiologist:   Kathlyn Sacramento, MD   Chief Complaint  Patient presents with   Other    C/o chest discomfort, elevated BP neck pain and abn EKG. Meds reviewed verbally with pt.      History of Present Illness: Julia Cooke is a 71 y.o. female who presents for evaluation of chest pain, shortness of breath and an abnormal EKG. She has known history of bipolar disorder, essential hypertension, hyperlipidemia and known left bundle branch block.  She is not a smoker and does not have diabetes.  Family history is remarkable for hypertension and CVA.  No premature coronary artery disease.  She has known history of left bundle branch block and was seen by Dr. Yvone Neu in our office in 2018.  She underwent an echocardiogram which showed normal LV systolic function with mild to moderate pulmonary regurgitation.Lexiscan Myoview showed no evidence of ischemia.  Over the last 6 months, she reports significant worsening of exertional dyspnea with associated chest pain described as burning sensation with exertion.  Her blood pressure has been uncontrolled in spite of losartan and Toprol.  Past Medical History:  Diagnosis Date   Bipolar disorder ([place])    takes Abilify   Depression    Dysrhythmia 10/2016   LBBB on ekg...nothing to compare it to.   Heart murmur    Hypertension    Nephrolithiasis 2000   s/p lithotripsy   Psychotic disorder ([place])    mania    Past Surgical History:  Procedure Laterality Date   EXTRACORPOREAL SHOCK WAVE LITHOTRIPSY Left 01/06/2017   Procedure: EXTRACORPOREAL SHOCK WAVE LITHOTRIPSY (ESWL);  Surgeon: Hollice Espy, MD;  Location: [facility] ORS;  Service: Urology;  Laterality: Left;   LITHOTRIPSY  2000     Current Outpatient Medications  Medication Sig Dispense Refill   ARIPiprazole (ABILIFY) 2 MG tablet Take 1 tablet (2 mg total) by  mouth in the morning. 30 tablet 1   atorvastatin (LIPITOR) 10 MG tablet Take 10 mg by mouth daily.     dicyclomine (BENTYL) 10 MG capsule Take by mouth.     lamoTRIgine (LAMICTAL) 25 MG tablet Take 1 tablet (25 mg total) by mouth daily. 90 tablet 1   meloxicam (MOBIC) 15 MG tablet TAKE 1 TABLET DAILY 90 tablet 3   metoprolol succinate (TOPROL-XL) 25 MG 24 hr tablet Take 1 tablet (25 mg total) by mouth daily. 90 tablet 1   omeprazole (PRILOSEC) 20 MG capsule Take 1 capsule (20 mg total) by mouth daily. 30 capsule 3   traZODone (DESYREL) 50 MG tablet TAKE 1 AND 1/2 TABLETS  TO 2 TABLETS BY MOUTH AT BEDTIME AS NEEDED FOR SLEEP 180 tablet 1   losartan (COZAAR) 100 MG tablet Take 1 tablet (100 mg total) by mouth daily. 7 tablet 0   metoprolol tartrate (LOPRESSOR) 100 MG tablet Take one tablet two hours before the test 1 tablet 0   No current facility-administered medications for this visit.    Allergies:   Penicillins    Social History:  The patient  reports that she has never smoked. She has never used smokeless tobacco. She reports that she does not drink alcohol and does not use drugs.   Family History:  The patient's family history includes Breast cancer (age of onset: 34) in her sister; Cancer in her sister; Fibromyalgia in her sister; Heart  Problems in her sister; Heart attack in her brother; Heart disease in her brother; Hyperlipidemia in her father and mother; Hypertension in her brother, father, mother, sister, and sister; Stroke in her mother.    ROS:  Please see the history of present illness.   Otherwise, review of systems are positive for none.   All other systems are reviewed and negative.    PHYSICAL EXAM: VS:  BP (!) 180/100 (BP Location: Left Arm, Patient Position: Sitting, Cuff Size: Normal)   Pulse 89   Ht 5' (1.524 m)   Wt 170 lb (77.1 kg)   SpO2 94%   BMI 33.20 kg/m  , BMI Body mass index is 33.2 kg/m. GEN: Well nourished, well developed, in no acute distress   HEENT: normal  Neck: no JVD, carotid bruits, or masses Cardiac: RRR; no murmurs, rubs, or gallops,no edema  Respiratory:  clear to auscultation bilaterally, normal work of breathing GI: soft, nontender, nondistended, + BS MS: no deformity or atrophy  Skin: warm and dry, no rash Neuro:  Strength and sensation are intact Psych: euthymic mood, full affect   EKG:  EKG is ordered today. The ekg ordered today demonstrates normal sinus rhythm with.   Recent Labs: 12/01/2021: ALT 13; BUN 27; Creatinine, Ser 0.79; Hemoglobin 13.1; Platelets 210.0; Potassium 4.6; Sodium 137; TSH 0.89    Lipid Panel    Component Value Date/Time   CHOL 203 (H) 12/01/2021 1105   TRIG 83.0 12/01/2021 1105   HDL 48.80 12/01/2021 1105   CHOLHDL 4 12/01/2021 1105   VLDL 16.6 12/01/2021 1105   LDLCALC 138 (H) 12/01/2021 1105   LDLDIRECT 162.0 12/23/2016 1135      Wt Readings from Last 3 Encounters:  11/11/22 170 lb (77.1 kg)  11/08/22 167 lb 9.6 oz (76 kg)  03/20/22 163 lb (73.9 kg)         12/07/2016    1:20 PM  PAD Screen  Previous PAD dx? No  Previous surgical procedure? No  Pain with walking? No  Feet/toe relief with dangling? No  Painful, non-healing ulcers? No  Extremities discolored? No      ASSESSMENT AND PLAN:  1.  Exertional dyspnea and chest pain: Concerning for underlying ischemic heart disease.  She has chronic left bundle branch block and thus stress testing is not accurate.  Recommend evaluation with cardiac CTA.  Will also obtain an echocardiogram.  2.  Essential hypertension: Her blood pressure is not well-controlled which might be causing some of her symptoms.  I increased losartan to 100 mg once daily.  If blood pressure remains elevated, recommend switching metoprolol to carvedilol.  3.  Hyperlipidemia: Currently on atorvastatin 10 mg once daily.  Most recent lipid profile showed an LDL of 138.  If CTA shows significant coronary artery disease, we will have to get her LDL  below 70.    Disposition:   FU with me in 6 weeks  Signed,  Kathlyn Sacramento, MD  11/11/2022 5:29 PM    [facility]

## 2022-11-11 NOTE — Patient Instructions (Addendum)
Medication Instructions:  INCREASE the Losartan to 100 mg once daily  *If you need a refill on your cardiac medications before your next appointment, please call your pharmacy*   Lab Work: Your provider would like for you to have following labs drawn: BMET.   Please go to the [facility] entrance and check in at the front desk.  You do not need an appointment.  They are open from 7am-6 pm.   If you have labs (blood work) drawn today and your tests are completely normal, you will receive your results only by: [place] (if you have MyChart) OR A paper copy in the mail If you have any lab test that is abnormal or we need to change your treatment, we will call you to review the results.   Testing/Procedures: Your physician has requested that you have an echocardiogram. Echocardiography is a painless test that uses sound waves to create images of your heart. It provides your doctor with information about the size and shape of your heart and how well your heart's chambers and valves are working.   You may receive an ultrasound enhancing agent through an IV if needed to better visualize your heart during the echo. This procedure takes approximately one hour.  There are no restrictions for this procedure.  This will take place at [facility] [address], [place]    Follow-Up: At [facility], you and your health needs are our priority.  As part of our continuing mission to provide you with exceptional heart care, we have created designated Provider Care Teams.  These Care Teams include your primary Cardiologist (physician) and Advanced Practice Providers (APPs -  Physician Assistants and Nurse Practitioners) who all work together to provide you with the care you need, when you need it.  We recommend signing up for the patient portal called "MyChart".  Sign up information is provided on this After Visit Summary.  MyChart is used to  connect with patients for Virtual Visits (Telemedicine).  Patients are able to view lab/test results, encounter notes, upcoming appointments, etc.  Non-urgent messages can be sent to your provider as well.   To learn more about what you can do with MyChart, go to [email or web address].    Your next appointment:   6 week(s)  The format for your next appointment:   In Person  Provider:   You may see Dr. Fletcher Anon or one of the following Advanced Practice Providers on your designated Care Team:   Murray Hodgkins, NP Christell Faith, PA-C Cadence Kathlen Mody, PA-C Gerrie Nordmann, NP    Other Instructions   Your cardiac CT will be scheduled at one of the below locations:   [facility] [address] [place], [place] [address] [phone]  [place] [address] [place], [place] [address] [phone]  [facility] [place], [place] [address] [phone]  If scheduled at [facility], please arrive at the [facility] and Children's Entrance (Entrance C2) of [facility] 30 minutes prior to test start time. You can use the FREE valet parking offered at entrance C (encouraged to control the heart rate for the test)  Proceed to the [facility] Radiology Department (first floor) to check-in and test prep.  All radiology patients and guests should use entrance C2 at [facility], accessed from [place], even though the hospital's physical address  listed is [address].    If scheduled at [facility] or [facility], please arrive 15 mins early for check-in and test prep.   Please follow these instructions carefully (unless otherwise directed):  Hold all erectile dysfunction medications at least 3 days (72 hrs) prior to test. (Ie viagra, cialis, sildenafil, tadalafil, etc) We will administer  nitroglycerin during this exam.   On the Night Before the Test: Be sure to Drink plenty of water. Do not consume any caffeinated/decaffeinated beverages or chocolate 12 hours prior to your test. Do not take any antihistamines 12 hours prior to your test.  On the Day of the Test: Drink plenty of water until 1 hour prior to the test. Do not eat any food 1 hour prior to test. You may take your regular medications prior to the test.  Take metoprolol (Lopressor) two hours prior to test. Hold the Succinate that morning. FEMALES- please wear underwire-free bra if available, avoid dresses & tight clothing   After the Test: Drink plenty of water. After receiving IV contrast, you may experience a mild flushed feeling. This is normal. On occasion, you may experience a mild rash up to 24 hours after the test. This is not dangerous. If this occurs, you can take Benadryl 25 mg and increase your fluid intake. If you experience trouble breathing, this can be serious. If it is severe call 911 IMMEDIATELY. If it is mild, please call our office. If you take any of these medications: Glipizide/Metformin, Avandament, Glucavance, please do not take 48 hours after completing test unless otherwise instructed.  We will call to schedule your test 2-4 weeks out understanding that some insurance companies will need an authorization prior to the service being performed.   For non-scheduling related questions, please contact the cardiac imaging nurse navigator should you have any questions/concerns: Marchia Bond, Cardiac Imaging Nurse Navigator Gordy Clement, Cardiac Imaging Nurse Navigator [facility] Direct Office Dial: [phone]   For scheduling needs, including cancellations and rescheduling, please call [place], [phone].

## 2022-11-11 NOTE — Telephone Encounter (Signed)
-----   Message from Nori Riis, PA-C sent at 11/11/2022  8:20 AM EST ----- Please let Mrs. Dayley know that her urine culture was positive for infection and she needs to start Septra DS, twice daily for seven days.

## 2022-11-11 NOTE — Telephone Encounter (Signed)
Ok per DPR, [facility] notifying pt of results. Abx Rx sent to pt's pharmacy.

## 2022-11-17 ENCOUNTER — Telehealth: Payer: Self-pay | Admitting: Cardiovascular Disease

## 2022-11-17 DIAGNOSIS — I10 Essential (primary) hypertension: Secondary | ICD-10-CM

## 2022-11-17 DIAGNOSIS — G8929 Other chronic pain: Secondary | ICD-10-CM

## 2022-11-17 DIAGNOSIS — K219 Gastro-esophageal reflux disease without esophagitis: Secondary | ICD-10-CM

## 2022-11-17 DIAGNOSIS — E785 Hyperlipidemia, unspecified: Secondary | ICD-10-CM

## 2022-11-17 DIAGNOSIS — M25562 Pain in left knee: Secondary | ICD-10-CM

## 2022-11-17 MED ORDER — LOSARTAN POTASSIUM 100 MG PO TABS: 100.0000 mg | ORAL_TABLET | Freq: Every day | ORAL | 0 refills | Status: DC

## 2022-11-17 NOTE — Telephone Encounter (Signed)
*  STAT* If patient is at the pharmacy, call can be transferred to refill team.   1. Which medications need to be refilled? (please list name of each medication and dose if known)   losartan (COZAAR) 100 MG tablet    2. Which pharmacy/location (including street and city if local pharmacy) is medication to be sent to?  [facility] [place], [place]    3. Do they need a 30 day or 90 day supply? 90 day    Pt would also like a callback regarding whether insurance has approved schedule CT. Please advise

## 2022-11-18 MED ORDER — LOSARTAN POTASSIUM 100 MG PO TABS: 100.0000 mg | ORAL_TABLET | Freq: Every day | ORAL | 0 refills | Status: DC

## 2022-11-18 NOTE — Telephone Encounter (Signed)
Requested Prescriptions   Signed Prescriptions Disp Refills   losartan (COZAAR) 100 MG tablet 90 tablet 0    Sig: Take 1 tablet (100 mg total) by mouth daily.    Authorizing Provider: Kathlyn Sacramento A    Ordering User: Britt Bottom

## 2022-11-30 ENCOUNTER — Ambulatory Visit: Payer: Medicare (Managed Care) | Admitting: Psychiatry

## 2022-11-30 DIAGNOSIS — I10 Essential (primary) hypertension: Secondary | ICD-10-CM | POA: Diagnosis not present

## 2022-11-30 DIAGNOSIS — E559 Vitamin D deficiency, unspecified: Secondary | ICD-10-CM | POA: Diagnosis not present

## 2022-11-30 DIAGNOSIS — E782 Mixed hyperlipidemia: Secondary | ICD-10-CM | POA: Diagnosis not present

## 2022-12-01 ENCOUNTER — Encounter: Payer: Self-pay | Admitting: Psychiatry

## 2022-12-01 ENCOUNTER — Ambulatory Visit (INDEPENDENT_AMBULATORY_CARE_PROVIDER_SITE_OTHER): Payer: Medicare HMO | Admitting: Psychiatry

## 2022-12-01 VITALS — BP 154/76 | HR 87 | Temp 98.1°F | Ht 60.0 in | Wt 172.6 lb

## 2022-12-01 DIAGNOSIS — G4701 Insomnia due to medical condition: Secondary | ICD-10-CM | POA: Diagnosis not present

## 2022-12-01 DIAGNOSIS — Z9189 Other specified personal risk factors, not elsewhere classified: Secondary | ICD-10-CM | POA: Diagnosis not present

## 2022-12-01 DIAGNOSIS — R69 Illness, unspecified: Secondary | ICD-10-CM | POA: Diagnosis not present

## 2022-12-01 DIAGNOSIS — F3178 Bipolar disorder, in full remission, most recent episode mixed: Secondary | ICD-10-CM | POA: Diagnosis not present

## 2022-12-01 MED ORDER — LAMOTRIGINE 25 MG PO TABS: 25.0000 mg | ORAL_TABLET | Freq: Two times a day (BID) | ORAL | 1 refills | Status: DC

## 2022-12-01 MED ORDER — LAMOTRIGINE 25 MG PO TABS: 25.0000 mg | ORAL_TABLET | Freq: Every day | ORAL | 1 refills | Status: DC

## 2022-12-01 NOTE — Progress Notes (Unsigned)
[facility] MD OP Progress Note  12/01/2022 12:13 PM Melissaann Dizdarevic  MRN:  [id number]  Chief Complaint:  Chief Complaint  Patient presents with   Follow-up   Medication Refill   Depression   Manic Behavior   HPI: Julia Cooke is a 72 year old Caucasian female, married, on SSI, lives in [place], has a history of bipolar disorder, was evaluated in office today.  Patient with recent worsening of mood symptoms, was started on Abilify after her visit on 10/26/2022.  Patient today returns for a follow-up reporting mood symptoms as improved.  She reports she does not feel she is under any kind of stress at this time.  Denies any significant racing thoughts.  Denies any manic or hypomanic symptoms.  Reports she continues to have sleep problems, since trazodone did not help she started taking melatonin 5 mg at night and that has been beneficial.  The Abilify did help with her mood symptoms.  She however reports she stopped taking it a few days ago.  Currently doing well without it.  Continues to be compliant on the Lamictal.  Denies side effects.  Patient reports she has been having shortness of breath with exertion and had an appointment with cardiology.  She has been referred for further testing.  Patient denies any suicidality, homicidality or perceptual disturbances.  3 word memory immediate 3 out of 3, after 5 minutes 2 out of 3.  Patient with attention and focus good, was able to do subtraction well.  Attempted to contact spouse for collateral information, unavailable at this time.  Discussed with patient also could reach out the writer as needed.  Patient denies any other concerns today.  Visit Diagnosis:    ICD-10-CM   1. Bipolar disorder, in full remission, most recent episode mixed (HCC)  F31.78 lamoTRIgine (LAMICTAL) 25 MG tablet    DISCONTINUED: lamoTRIgine (LAMICTAL) 25 MG tablet    2. Insomnia due to medical condition  G47.01     3. At risk for long QT syndrome  Z91.89        Past Psychiatric History: Reviewed past psychiatric history from progress note on 05/02/2018.  Past trials of Abilify, Seroquel.  Past Medical History:  Past Medical History:  Diagnosis Date   Bipolar disorder ([place])    takes Abilify   Depression    Dysrhythmia 10/2016   LBBB on ekg...nothing to compare it to.   Heart murmur    Hypertension    Nephrolithiasis 2000   s/p lithotripsy   Psychotic disorder ([place])    mania    Past Surgical History:  Procedure Laterality Date   EXTRACORPOREAL SHOCK WAVE LITHOTRIPSY Left 01/06/2017   Procedure: EXTRACORPOREAL SHOCK WAVE LITHOTRIPSY (ESWL);  Surgeon: Hollice Espy, MD;  Location: [facility] ORS;  Service: Urology;  Laterality: Left;   LITHOTRIPSY  2000    Family Psychiatric History: Reviewed family psychiatric history from progress note on 05/02/2018.  Family History:  Family History  Problem Relation Age of Onset   Hyperlipidemia Mother    Stroke Mother    Hypertension Mother    Hyperlipidemia Father    Hypertension Father    Cancer Sister        breast   Breast cancer Sister 87   Heart disease Brother        myocardial infarction   Heart attack Brother    Hypertension Brother    Fibromyalgia Sister    Hypertension Sister    Heart Problems Sister    Hypertension Sister  Prostate cancer Neg Hx    Kidney cancer Neg Hx    Bladder Cancer Neg Hx     Social History: Reviewed social history from progress note on 05/02/2018. Social History   Socioeconomic History   Marital status: Married    Spouse name: richard   Number of children: 3   Years of education: Not on file   Highest education level: Some college, no degree  Occupational History    Comment: retired  Tobacco Use   Smoking status: Never   Smokeless tobacco: Never  Vaping Use   Vaping Use: Never used  Substance and Sexual Activity   Alcohol use: No    Alcohol/week: 0.0 - 2.0 standard drinks of alcohol    Comment: 2-3 times weekly   Drug use: No   Sexual  activity: Yes    Birth control/protection: Post-menopausal, None  Other Topics Concern   Not on file  Social History Narrative   Patient lives at home with her husband of 80 years. They have 3 adult children (2 boys, 1 girl). They recently moved back to [place] in January. 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   Social Determinants of Health   Financial Resource Strain: Low Risk  (04/30/2021)   Overall Financial Resource Strain (CARDIA)    Difficulty of Paying Living Expenses: Not hard at all  Food Insecurity: No Food Insecurity (04/30/2021)   Hunger Vital Sign    Worried About Running Out of Food in the Last Year: Never true    Ran Out of Food in the Last Year: Never true  Transportation Needs: No Transportation Needs (04/30/2021)   PRAPARE - Hydrologist (Medical): No    Lack of Transportation (Non-Medical): No  Physical Activity: Sufficiently Active (04/30/2021)   Exercise Vital Sign    Days of Exercise per Week: 4 days    Minutes of Exercise per Session: 120 min  Stress: No Stress Concern Present (04/30/2021)   [place]    Feeling of Stress : Not at all  Social Connections: [place] (04/30/2021)   Social Connection and Isolation Panel [NHANES]    Frequency of Communication with Friends and Family: More than three times a week    Frequency of Social Gatherings with Friends and Family: More than three times a week    Attends Religious Services: More than 4 times per year    Active Member of [employer] or Organizations: Yes    Attends Music therapist: More than 4 times per year    Marital Status: Married    Allergies:  Allergies  Allergen Reactions   Penicillins Swelling    As a child    Metabolic Disorder Labs: Lab Results  Component Value Date   HGBA1C 5.0 12/01/2021   MPG 94 01/20/2018    Lab Results  Component Value Date   PROLACTIN 3.2 (L) 01/20/2018   Lab Results  Component Value Date   CHOL 203 (H) 12/01/2021   TRIG 83.0 12/01/2021   HDL 48.80 12/01/2021   CHOLHDL 4 12/01/2021   VLDL 16.6 12/01/2021   LDLCALC 138 (H) 12/01/2021   LDLCALC 154 (H) 04/08/2021   Lab Results  Component Value Date   TSH 0.89 12/01/2021   TSH 0.95 04/08/2021    Therapeutic Level Labs: No results found for: "LITHIUM" No results found for: "VALPROATE" Lab Results  Component Value Date   CBMZ 2.9 (L) 01/07/2014    Current Medications: Current Outpatient Medications  Medication Sig Dispense Refill   atorvastatin (LIPITOR) 10 MG tablet Take 10 mg by mouth daily.     losartan (COZAAR) 100 MG tablet Take 1 tablet (100 mg total) by mouth daily. 90 tablet 0   melatonin 5 MG TABS Take 5 mg by mouth.     metoprolol tartrate (LOPRESSOR) 100 MG tablet Take one tablet two hours before the test 1 tablet 0   omeprazole (PRILOSEC) 20 MG capsule Take 1 capsule (20 mg total) by mouth daily. 30 capsule 3   traZODone (DESYREL) 50 MG tablet TAKE 1 AND 1/2 TABLETS  TO 2 TABLETS BY MOUTH AT BEDTIME AS NEEDED FOR SLEEP 180 tablet 1   lamoTRIgine (LAMICTAL) 25 MG tablet Take 1 tablet (25 mg total) by mouth 2 (two) times daily. 180 tablet 1   metoprolol succinate (TOPROL-XL) 25 MG 24 hr tablet Take 1 tablet (25 mg total) by mouth daily. 90 tablet 1   No current facility-administered medications for this visit.     Musculoskeletal: Strength & Muscle Tone: within normal limits Gait & Station: normal Patient leans: N/A  Psychiatric Specialty Exam: Review of Systems  Respiratory:  Positive for shortness of breath (following up with cardiology).   Musculoskeletal:  Positive for arthralgias.  All other systems reviewed and are negative.   Blood pressure (!) 154/76, pulse 87, temperature 98.1 F (36.7 C), temperature source Oral, height 5' (1.524 m), weight 172 lb 9.6 oz (78.3 kg), SpO2 96  %.Body mass index is 33.71 kg/m.  General Appearance: Casual  Eye Contact:  Fair  Speech:  Clear and Coherent  Volume:  Normal  Mood:  Euthymic  Affect:  Congruent  Thought Process:  Goal Directed and Descriptions of Associations: Intact  Orientation:  Full (Time, Place, and Person)  Thought Content: Logical   Suicidal Thoughts:  No  Homicidal Thoughts:  No  Memory:  Immediate;   Fair Recent;   Fair Remote;   Fair  Judgement:  Fair  Insight:  Fair  Psychomotor Activity:  Normal  Concentration:  Concentration: Fair and Attention Span: Fair  Recall:  [employer] of Knowledge: Fair  Language: Fair  Akathisia:  No  Handed:  Right  AIMS (if indicated): not done  Assets:  Communication Skills Desire for Improvement Housing Social Support  ADL's:  Intact  Cognition: WNL  Sleep:  Fair   Screenings: Administrator, Civil Service Office Visit from 06/17/2022 in [place] Office Visit from 03/27/2021 in [place] Total Score 0 1      [place] Visit from 12/01/2022 in [place] Office Visit from 10/26/2022 in [place] Visit from 06/17/2022 in [place]  Total GAD-7 Score '4 13 2      '$ PHQ2-9    [place] Office Visit from 12/01/2022 in [place] Visit from 10/26/2022 in [place] from 06/17/2022 in [place] Visit from 01/18/2022 in [place] Video Visit from 11/26/2021 in [place]  PHQ-2 Total Score 0 1 0 0 0  PHQ-9 Total Score 2 11 -- -- --      [place] Office Visit from 12/01/2022 in [place] Visit from 10/26/2022 in [place]  Visit from 06/17/2022  in [place] No Risk No Risk No Risk        Assessment and Plan: Naavya Postma is a 72 year old Caucasian female who has a history of bipolar disorder, currently improved, noncompliant on the Abilify, discussed plan as noted below.  Plan Bipolar disorder type I mixed mild in remission Lamotrigine 25 mg p.o. twice daily Discontinue Abilify for noncompliance.  Insomnia-improving Continue melatonin 5 mg p.o. nightly Trazodone 50 mg at bedtime, half to 2 tablets as needed.  At risk for prolonged QT syndrome-EKG - 11/11/22 reviewed - normal sinus rhythm, left bundle branch block. Patient currently under the care of cardiology. Reviewed notes per Dr.Arida dated 11/11/2022-patient with exertional chest pain, shortness of breath, scheduled for CT chest and echocardiogram.  Losartan dosage was readjusted since blood pressure was not controlled.  Attempted to contact spouse to obtain collateral information, unavailable.  Patient advised that spouse could reach out the writer as needed with concerns.  Follow-up in clinic in 6 to 7 weeks or sooner if needed.  This note was generated in part or whole with voice recognition software. Voice recognition is usually quite accurate but there are transcription errors that can and very often do occur. I apologize for any typographical errors that were not detected and corrected.     Ursula Alert, MD 12/01/2022, 12:13 PM

## 2022-12-06 ENCOUNTER — Other Ambulatory Visit
Admission: RE | Admit: 2022-12-06 | Discharge: 2022-12-06 | Disposition: A | Discharge status: Home / Self Care | Payer: Medicare HMO | Attending: Cardiovascular Disease | Admitting: Cardiovascular Disease

## 2022-12-06 ENCOUNTER — Telehealth: Payer: Self-pay | Admitting: Psychiatry

## 2022-12-06 DIAGNOSIS — R0602 Shortness of breath: Secondary | ICD-10-CM | POA: Insufficient documentation

## 2022-12-06 DIAGNOSIS — E538 Deficiency of other specified B group vitamins: Secondary | ICD-10-CM | POA: Insufficient documentation

## 2022-12-06 DIAGNOSIS — K219 Gastro-esophageal reflux disease without esophagitis: Secondary | ICD-10-CM | POA: Diagnosis not present

## 2022-12-06 DIAGNOSIS — M1711 Unilateral primary osteoarthritis, right knee: Secondary | ICD-10-CM | POA: Diagnosis not present

## 2022-12-06 DIAGNOSIS — E782 Mixed hyperlipidemia: Secondary | ICD-10-CM | POA: Diagnosis not present

## 2022-12-06 DIAGNOSIS — M8589 Other specified disorders of bone density and structure, multiple sites: Secondary | ICD-10-CM | POA: Diagnosis not present

## 2022-12-06 DIAGNOSIS — I10 Essential (primary) hypertension: Secondary | ICD-10-CM | POA: Diagnosis not present

## 2022-12-06 DIAGNOSIS — F3178 Bipolar disorder, in full remission, most recent episode mixed: Secondary | ICD-10-CM

## 2022-12-06 DIAGNOSIS — M25562 Pain in left knee: Secondary | ICD-10-CM | POA: Diagnosis not present

## 2022-12-06 DIAGNOSIS — E559 Vitamin D deficiency, unspecified: Secondary | ICD-10-CM | POA: Diagnosis not present

## 2022-12-06 DIAGNOSIS — R69 Illness, unspecified: Secondary | ICD-10-CM | POA: Diagnosis not present

## 2022-12-06 DIAGNOSIS — K580 Irritable bowel syndrome with diarrhea: Secondary | ICD-10-CM | POA: Diagnosis not present

## 2022-12-06 LAB — BASIC METABOLIC PANEL
Anion gap: 7 (ref 5–15)
BUN: 21 mg/dL (ref 8–23)
CO2: 25 mmol/L (ref 22–32)
Calcium: 9.4 mg/dL (ref 8.9–10.3)
Chloride: 106 mmol/L (ref 98–111)
Creatinine, Ser: 0.82 mg/dL (ref 0.44–1.00)
GFR, Estimated: >60 mL/min (ref >60)
Glucose, Bld: 96 mg/dL (ref 70–99)
Potassium: 4 mmol/L (ref 3.5–5.1)
Sodium: 138 mmol/L (ref 135–145)

## 2022-12-06 MED ORDER — ARIPIPRAZOLE 2 MG PO TABS: 2.0000 mg | ORAL_TABLET | Freq: Every morning | ORAL | 1 refills | Status: DC

## 2022-12-06 NOTE — Telephone Encounter (Signed)
Patient stopped by office after visit last week. States that should would like to be put on Abilify per the conversation at last appointment. She doesn't think she gave it enough time. Please advise and send prescription to [facility] on [address].

## 2022-12-06 NOTE — Telephone Encounter (Signed)
I have sent Abilify 2 mg-low dosage to [facility].

## 2022-12-07 NOTE — Telephone Encounter (Signed)
left message that rx was sent.

## 2022-12-08 ENCOUNTER — Telehealth (HOSPITAL_COMMUNITY): Payer: Self-pay | Admitting: Emergency Medicine

## 2022-12-08 NOTE — Telephone Encounter (Signed)
Attempted to call patient regarding upcoming cardiac CT appointment. °Left message on voicemail with name and callback number °Hortensia Duffin RN Navigator Cardiac Imaging °[facility] °[phone] Office °[phone] Cell ° °

## 2022-12-09 ENCOUNTER — Ambulatory Visit
Admission: RE | Admit: 2022-12-09 | Discharge: 2022-12-09 | Disposition: A | Discharge status: Home / Self Care | Payer: Medicare HMO | Source: Ambulatory Visit | Attending: Cardiovascular Disease | Admitting: Cardiovascular Disease

## 2022-12-09 DIAGNOSIS — R072 Precordial pain: Secondary | ICD-10-CM | POA: Diagnosis not present

## 2022-12-09 MED ORDER — DILTIAZEM HCL 25 MG/5ML IV SOLN
10.0000 mg | Freq: Once | INTRAVENOUS | Status: AC
  Administered 2022-12-09: 10 mg via INTRAVENOUS

## 2022-12-09 MED ORDER — IOHEXOL 350 MG/ML SOLN: 100.0000 mL | Freq: Once | INTRAVENOUS | Status: DC | PRN

## 2022-12-09 MED ORDER — METOPROLOL TARTRATE 5 MG/5ML IV SOLN
10.0000 mg | Freq: Once | INTRAVENOUS | Status: AC
  Administered 2022-12-09: 10 mg via INTRAVENOUS

## 2022-12-09 MED ORDER — NITROGLYCERIN 0.4 MG SL SUBL: 0.8000 mg | SUBLINGUAL_TABLET | Freq: Once | SUBLINGUAL | Status: DC

## 2022-12-09 NOTE — Progress Notes (Signed)
Patient arrived for Cardiac CT took pre medication Metoprolol '100mg'$  PO. Upon arrival heart rate 90. Administered Metoprolol '10mg'$  IVP and Cardizem '20mg'$  IV. Heart rate 70-76. Notified Dr Garen Lah reschedule Cardiac CT with Pre-medication Metoprolol '100mg'$  PO and Ivabradine '10mg'$  PO. Patient verbalized understanding of plan of care.

## 2022-12-14 ENCOUNTER — Ambulatory Visit: Payer: Medicare (Managed Care) | Admitting: Cardiovascular Disease

## 2022-12-27 ENCOUNTER — Telehealth: Payer: Self-pay | Admitting: Cardiovascular Disease

## 2022-12-27 NOTE — Telephone Encounter (Signed)
The patient called in stating that she was told that she would need two medications for her CT because when it was attempted in the past her blood pressure was too high. She was calling to see if they have both been sent in. She has been made aware that the Metoprolol Tartrate was sent in. We will look into whether she needs a second one.

## 2022-12-27 NOTE — Telephone Encounter (Signed)
Patient is calling wanting to know if the two meds she needs prior to her CT have been called into the pharmacy as well as if authorization has been approved for echo. Please advise.

## 2022-12-27 NOTE — Telephone Encounter (Signed)
I have no idea if she needs Corlan[place] or not.  There is no documentation in the chart about what happened when she went to get her CTA.  I added Marchia Bond to see if she can help with this.

## 2022-12-28 ENCOUNTER — Telehealth (HOSPITAL_COMMUNITY): Payer: Self-pay | Admitting: Emergency Medicine

## 2022-12-28 DIAGNOSIS — Z823 Family history of stroke: Secondary | ICD-10-CM | POA: Diagnosis not present

## 2022-12-28 DIAGNOSIS — Z833 Family history of diabetes mellitus: Secondary | ICD-10-CM | POA: Diagnosis not present

## 2022-12-28 DIAGNOSIS — R32 Unspecified urinary incontinence: Secondary | ICD-10-CM | POA: Diagnosis not present

## 2022-12-28 DIAGNOSIS — I10 Essential (primary) hypertension: Secondary | ICD-10-CM | POA: Diagnosis not present

## 2022-12-28 DIAGNOSIS — Z87892 Personal history of anaphylaxis: Secondary | ICD-10-CM | POA: Diagnosis not present

## 2022-12-28 DIAGNOSIS — R69 Illness, unspecified: Secondary | ICD-10-CM | POA: Diagnosis not present

## 2022-12-28 DIAGNOSIS — Z880 Allergy status to penicillin: Secondary | ICD-10-CM | POA: Diagnosis not present

## 2022-12-28 DIAGNOSIS — Z85828 Personal history of other malignant neoplasm of skin: Secondary | ICD-10-CM | POA: Diagnosis not present

## 2022-12-28 DIAGNOSIS — Z8249 Family history of ischemic heart disease and other diseases of the circulatory system: Secondary | ICD-10-CM | POA: Diagnosis not present

## 2022-12-28 DIAGNOSIS — G8929 Other chronic pain: Secondary | ICD-10-CM | POA: Diagnosis not present

## 2022-12-28 NOTE — Telephone Encounter (Signed)
Phone call to patient to prescribe CCTA HR lowering medications for her next attempt on 01/03/23.   She will need '100mg'$  metoprolol + '10mg'$  ivabradine  LMTCB  Marchia Bond RN Navigator Cardiac Imaging [facility] [phone] Office  [phone] Cell

## 2022-12-29 ENCOUNTER — Other Ambulatory Visit (HOSPITAL_COMMUNITY): Payer: Self-pay | Admitting: Emergency Medicine

## 2022-12-29 DIAGNOSIS — R079 Chest pain, unspecified: Secondary | ICD-10-CM

## 2022-12-29 MED ORDER — METOPROLOL TARTRATE 100 MG PO TABS: 100.0000 mg | ORAL_TABLET | Freq: Once | ORAL | 0 refills | Status: DC

## 2022-12-29 MED ORDER — IVABRADINE HCL 5 MG PO TABS: 10.0000 mg | ORAL_TABLET | Freq: Once | ORAL | 0 refills | Status: AC

## 2022-12-30 ENCOUNTER — Telehealth (HOSPITAL_COMMUNITY): Payer: Self-pay | Admitting: *Deleted

## 2022-12-30 NOTE — Telephone Encounter (Signed)
Reaching out to patient to offer assistance regarding upcoming cardiac imaging study; pt verbalizes understanding of appt date/time, parking situation and where to check in, pre-test NPO status and medications ordered, and verified current allergies; name and call back number provided for further questions should they arise  Gordy Clement RN Navigator Cardiac Imaging [facility] [phone] office [phone] cell  Patient to take '100mg'$  metoprolol tartrate and '10mg'$  ivabardine two hours prior to her caridac CT scan.

## 2022-12-30 NOTE — Telephone Encounter (Signed)
Good morning,  Theres a progress note by Marya Amsler on 12/09/22 that states she arrived with HR 90s and IV push meds were attempted but unsuccessful. Dr. Garen Lah suggested '100mg'$  metoprolol + '10mg'$  ivabradine. I will follow up with the patient about medications for the next attempt.  Thank you, Marchia Bond RN Navigator Cardiac [facility] [phone] Office [phone] Cell

## 2023-01-03 ENCOUNTER — Ambulatory Visit
Admission: RE | Admit: 2023-01-03 | Discharge: 2023-01-03 | Disposition: A | Discharge status: Home / Self Care | Payer: Medicare HMO | Source: Ambulatory Visit | Attending: Cardiovascular Disease | Admitting: Cardiovascular Disease

## 2023-01-03 DIAGNOSIS — R072 Precordial pain: Secondary | ICD-10-CM | POA: Diagnosis not present

## 2023-01-03 MED ORDER — IOHEXOL 350 MG/ML SOLN
100.0000 mL | Freq: Once | INTRAVENOUS | Status: AC | PRN
  Administered 2023-01-03: 100 mL via INTRAVENOUS

## 2023-01-03 MED ORDER — NITROGLYCERIN 0.4 MG SL SUBL
0.8000 mg | SUBLINGUAL_TABLET | Freq: Once | SUBLINGUAL | Status: AC
  Administered 2023-01-03: 0.8 mg via SUBLINGUAL

## 2023-01-03 MED ORDER — DILTIAZEM HCL 25 MG/5ML IV SOLN
10.0000 mg | Freq: Once | INTRAVENOUS | Status: AC
  Administered 2023-01-03: 10 mg via INTRAVENOUS

## 2023-01-03 MED ORDER — METOPROLOL TARTRATE 5 MG/5ML IV SOLN
10.0000 mg | Freq: Once | INTRAVENOUS | Status: AC
  Administered 2023-01-03: 10 mg via INTRAVENOUS

## 2023-01-03 NOTE — Progress Notes (Signed)
Patient tolerated procedure well. Ambulate w/o difficulty. Denies any lightheadedness or being dizzy. Pt denies any pain at this time. Sitting in chair, pt is encouraged to drink additional water throughout the day and reason explained to patient. Patient verbalized understanding and all questions answered. ABC intact. No further needs at this time. Discharge from procedure area w/o issues.  

## 2023-01-04 ENCOUNTER — Ambulatory Visit: Payer: Medicare HMO | Attending: Cardiovascular Disease

## 2023-01-04 DIAGNOSIS — R0602 Shortness of breath: Secondary | ICD-10-CM | POA: Diagnosis not present

## 2023-01-05 LAB — ECHOCARDIOGRAM COMPLETE
AR max vel: 1.76 cm2
AV Area VTI: 1.91 cm2
AV Area mean vel: 1.66 cm2
AV Mean grad: 5 mmHg
AV Peak grad: 8.5 mmHg
Ao pk vel: 1.46 m/s
Area-P 1/2: 3.17 cm2
Calc EF: 58.3 %
P 1/2 time: 455 msec
S' Lateral: 3 cm
Single Plane A2C EF: 58.5 %
Single Plane A4C EF: 57 %

## 2023-01-07 ENCOUNTER — Encounter: Payer: Self-pay | Admitting: Cardiovascular Disease

## 2023-01-07 ENCOUNTER — Ambulatory Visit: Payer: Medicare (Managed Care) | Admitting: Cardiovascular Disease

## 2023-01-07 ENCOUNTER — Telehealth: Payer: Self-pay | Admitting: Cardiovascular Disease

## 2023-01-07 ENCOUNTER — Other Ambulatory Visit: Payer: Self-pay

## 2023-01-07 ENCOUNTER — Ambulatory Visit: Payer: Medicare HMO | Attending: Cardiovascular Disease | Admitting: Cardiovascular Disease

## 2023-01-07 VITALS — BP 150/82 | HR 84 | Ht 60.25 in | Wt 171.5 lb

## 2023-01-07 DIAGNOSIS — K219 Gastro-esophageal reflux disease without esophagitis: Secondary | ICD-10-CM

## 2023-01-07 DIAGNOSIS — E785 Hyperlipidemia, unspecified: Secondary | ICD-10-CM | POA: Diagnosis not present

## 2023-01-07 DIAGNOSIS — I340 Nonrheumatic mitral (valve) insufficiency: Secondary | ICD-10-CM

## 2023-01-07 DIAGNOSIS — R079 Chest pain, unspecified: Secondary | ICD-10-CM | POA: Diagnosis not present

## 2023-01-07 DIAGNOSIS — I10 Essential (primary) hypertension: Secondary | ICD-10-CM

## 2023-01-07 DIAGNOSIS — M25562 Pain in left knee: Secondary | ICD-10-CM

## 2023-01-07 DIAGNOSIS — G8929 Other chronic pain: Secondary | ICD-10-CM

## 2023-01-07 MED ORDER — ATORVASTATIN CALCIUM 10 MG PO TABS
10.0000 mg | ORAL_TABLET | Freq: Every day | ORAL | 0 refills | Status: DC
  Filled 2023-06-24 – 2023-07-04 (×2): qty 90, 90d supply, fill #0

## 2023-01-07 MED ORDER — LOSARTAN POTASSIUM 100 MG PO TABS: 100.0000 mg | ORAL_TABLET | Freq: Every day | ORAL | 0 refills | Status: DC

## 2023-01-07 MED ORDER — CARVEDILOL 6.25 MG PO TABS: 6.2500 mg | ORAL_TABLET | Freq: Two times a day (BID) | ORAL | 3 refills | Status: DC

## 2023-01-07 NOTE — Telephone Encounter (Signed)
Left a detailed message for the patient to verify correct pharmacy for refills.  The patient should also contact her PCP for a refill on the cholesterol medication.

## 2023-01-07 NOTE — Progress Notes (Signed)
Cardiology Office Note   Date:  01/07/2023   ID:  Julia Cooke, Julia Cooke 03-Apr-1951, MRN [id number]  PCP:  Wayland Denis, PA-C  Cardiologist:   Kathlyn Sacramento, MD   Chief Complaint  Patient presents with   Follow up Echo & Cardiac CT results.     "Doing well." Medications reviewed by the patient verbally.       History of Present Illness: Julia Cooke is a 72 y.o. female who presents for a follow-up visit regarding chest pain, shortness of breath and an abnormal EKG.   She has known history of bipolar disorder, essential hypertension, hyperlipidemia and known left bundle branch block.  She is not a smoker and does not have diabetes.  Family history is remarkable for hypertension and CVA.  No premature coronary artery disease.  She has known history of left bundle branch block and was seen by Dr. Yvone Neu in our office in 2018.  She underwent an echocardiogram which showed normal LV systolic function with mild to moderate pulmonary regurgitation.Lexiscan Myoview showed no evidence of ischemia.  She was seen recently for worsening exertional dyspnea with associated chest pain.  Her blood pressure was uncontrolled in spite of treatment with losartan and Toprol.  During her initial visit, I increased the dose of losartan. She underwent an echocardiogram which showed normal LV systolic function, mild left ventricular hypertrophy, grade 1 diastolic dysfunction, mild pulmonary hypertension with peak systolic pressure of 37 mmHg and mild to moderate mitral regurgitation. Cardiac CTA was done and showed calcium score of 0 with no significant coronary artery disease.  She reports improvement in chest pain.  Shortness of breath is stable.  Past Medical History:  Diagnosis Date   Bipolar disorder ([place])    takes Abilify   Depression    Dysrhythmia 10/2016   LBBB on ekg...nothing to compare it to.   Heart murmur    Hypertension    Nephrolithiasis 2000   s/p lithotripsy   Psychotic disorder  ([place])    mania    Past Surgical History:  Procedure Laterality Date   EXTRACORPOREAL SHOCK WAVE LITHOTRIPSY Left 01/06/2017   Procedure: EXTRACORPOREAL SHOCK WAVE LITHOTRIPSY (ESWL);  Surgeon: Hollice Espy, MD;  Location: [facility] ORS;  Service: Urology;  Laterality: Left;   LITHOTRIPSY  2000     Current Outpatient Medications  Medication Sig Dispense Refill   ARIPiprazole (ABILIFY) 2 MG tablet Take 1 tablet (2 mg total) by mouth in the morning. 30 tablet 1   atorvastatin (LIPITOR) 10 MG tablet Take 10 mg by mouth daily.     lamoTRIgine (LAMICTAL) 25 MG tablet Take 1 tablet (25 mg total) by mouth 2 (two) times daily. 180 tablet 1   losartan (COZAAR) 100 MG tablet Take 1 tablet (100 mg total) by mouth daily. 90 tablet 0   melatonin 5 MG TABS Take 5 mg by mouth.     metoprolol succinate (TOPROL-XL) 25 MG 24 hr tablet Take 1 tablet (25 mg total) by mouth daily. 90 tablet 1   omeprazole (PRILOSEC) 20 MG capsule Take 1 capsule (20 mg total) by mouth daily. 30 capsule 3   traZODone (DESYREL) 50 MG tablet TAKE 1 AND 1/2 TABLETS  TO 2 TABLETS BY MOUTH AT BEDTIME AS NEEDED FOR SLEEP 180 tablet 1   No current facility-administered medications for this visit.    Allergies:   Penicillins    Social History:  The patient  reports that she has never smoked. She has never used smokeless tobacco.  She reports that she does not drink alcohol and does not use drugs.   Family History:  The patient's family history includes Breast cancer (age of onset: 73) in her sister; Cancer in her sister; Fibromyalgia in her sister; Heart Problems in her sister; Heart attack in her brother; Heart disease in her brother; Hyperlipidemia in her father and mother; Hypertension in her brother, father, mother, sister, and sister; Stroke in her mother.    ROS:  Please see the history of present illness.   Otherwise, review of systems are positive for none.   All other systems are reviewed and negative.    PHYSICAL EXAM: VS:   BP (!) 150/82 (BP Location: Left Arm, Patient Position: Sitting, Cuff Size: Normal)   Pulse 84   Ht 5' 0.25" (1.53 m)   Wt 171 lb 8 oz (77.8 kg)   SpO2 98%   BMI 33.22 kg/m  , BMI Body mass index is 33.22 kg/m. GEN: Well nourished, well developed, in no acute distress  HEENT: normal  Neck: no JVD, carotid bruits, or masses Cardiac: RRR; no murmurs, rubs, or gallops,no edema  Respiratory:  clear to auscultation bilaterally, normal work of breathing GI: soft, nontender, nondistended, + BS MS: no deformity or atrophy  Skin: warm and dry, no rash Neuro:  Strength and sensation are intact Psych: euthymic mood, full affect   EKG:  EKG is not ordered today.    Recent Labs: 12/06/2022: BUN 21; Creatinine, Ser 0.82; Potassium 4.0; Sodium 138    Lipid Panel    Component Value Date/Time   CHOL 203 (H) 12/01/2021 1105   TRIG 83.0 12/01/2021 1105   HDL 48.80 12/01/2021 1105   CHOLHDL 4 12/01/2021 1105   VLDL 16.6 12/01/2021 1105   LDLCALC 138 (H) 12/01/2021 1105   LDLDIRECT 162.0 12/23/2016 1135      Wt Readings from Last 3 Encounters:  01/07/23 171 lb 8 oz (77.8 kg)  11/11/22 170 lb (77.1 kg)  11/08/22 167 lb 9.6 oz (76 kg)         12/07/2016    1:20 PM  PAD Screen  Previous PAD dx? No  Previous surgical procedure? No  Pain with walking? No  Feet/toe relief with dangling? No  Painful, non-healing ulcers? No  Extremities discolored? No      ASSESSMENT AND PLAN:  1.  Exertional dyspnea and chest pain: Cardiac CTA showed no evidence of coronary artery disease.  Suspect that some of her symptoms are likely related to hypertensive heart disease with diastolic dysfunction and mild pulmonary hypertension.  It is critical that we controlled her blood pressure.  In addition, she has underlying left bundle branch block but her ejection fraction is normal.  2.  Essential hypertension: Her blood pressure improved after increasing the dose of losartan to 100 mg once daily but  still not controlled.  I elected to switch Toprol to carvedilol 6.25 mg twice daily.  3.  Hyperlipidemia: I reviewed most recent lipid profile done in January which showed significant improvement in LDL which was down to 82.  Continue current dose of atorvastatin.  4.  Mild to moderate mitral regurgitation: Currently asymptomatic: Consider follow-up echocardiogram in 2 to 3 years.    Disposition:   FU in 3 months to recheck blood pressure and uptitrate carvedilol if needed.  Signed,  Kathlyn Sacramento, MD  01/07/2023 8:52 AM    [facility]

## 2023-01-07 NOTE — Telephone Encounter (Signed)
*  STAT* If patient is at the pharmacy, call can be transferred to refill team.   1. Which medications need to be refilled? (please list name of each medication and dose if known)  atorvastatin (LIPITOR) 10 MG tablet  losartan (COZAAR) 100 MG tablet   2. Which pharmacy/location (including street and city if local pharmacy) is medication to be sent to? [facility] [id number] - [address], [place]   3. Do they need a 30 day or 90 day supply? 90 day  Patient states she wants all future refills sent to [facility] and requests [facility] be removed.

## 2023-01-07 NOTE — Patient Instructions (Signed)
Medication Instructions:  STOP the Metoprolol  START Carvedilol 6.25 mg twice daily  *If you need a refill on your cardiac medications before your next appointment, please call your pharmacy*   Lab Work: None ordered If you have labs (blood work) drawn today and your tests are completely normal, you will receive your results only by: [place] (if you have MyChart) OR A paper copy in the mail If you have any lab test that is abnormal or we need to change your treatment, we will call you to review the results.   Testing/Procedures: None ordered   Follow-Up: At [facility], you and your health needs are our priority.  As part of our continuing mission to provide you with exceptional heart care, we have created designated Provider Care Teams.  These Care Teams include your primary Cardiologist (physician) and Advanced Practice Providers (APPs -  Physician Assistants and Nurse Practitioners) who all work together to provide you with the care you need, when you need it.  We recommend signing up for the patient portal called "MyChart".  Sign up information is provided on this After Visit Summary.  MyChart is used to connect with patients for Virtual Visits (Telemedicine).  Patients are able to view lab/test results, encounter notes, upcoming appointments, etc.  Non-urgent messages can be sent to your provider as well.   To learn more about what you can do with MyChart, go to [email or web address].    Your next appointment:   3 month(s)  Provider:   You may see Kathlyn Sacramento, MD or one of the following Advanced Practice Providers on your designated Care Team:   Murray Hodgkins, NP Christell Faith, PA-C Cadence Kathlen Mody, PA-C Gerrie Nordmann, NP

## 2023-01-07 NOTE — Telephone Encounter (Signed)
Medications refilled as requested

## 2023-01-16 ENCOUNTER — Encounter: Payer: Self-pay | Admitting: Cardiovascular Disease

## 2023-01-17 NOTE — Progress Notes (Unsigned)
Cardiology Office Note    Date:  01/18/2023   ID:  Julia, Cooke 04-17-1951, MRN [id number]  PCP:  Julia Denis, PA-C  Cardiologist:  Kathlyn Sacramento, MD  Electrophysiologist:  None   Chief Complaint: Hypertension  History of Present Illness:   Julia Cooke is a 72 y.o. female with history of normal coronary arteries by coronary CTA in 12/2022, HTN, HLD, bipolar disorder, and left bundle branch block who presents for evaluation of hypertension.  She was previously evaluated by Dr. Yvone Cooke with echo in 11/2016 showing an EF of 55 to 60%, normal wall motion, grade 1 diastolic dysfunction, and mild to moderate pulmonic regurgitation.  Lexiscan MPI in 11/2016 showed no evidence of significant ischemia and was overall low risk.  She is a non-smoker and does not have history of diabetes.  Her family history is remarkable for hypertension and CVA.  She established with Dr. Fletcher Cooke in 10/2022 for evaluation of worsening exertional dyspnea with associated chest discomfort described as burning with exertion.  It was also noted her blood pressure has been uncontrolled despite losartan and metoprolol.  Given symptoms, she underwent coronary CTA on 01/03/2023 that showed a calcium score of 0 with no evidence of CAD.  There were no significant extracardiac abnormalities noted.  Echo on 01/04/2023 showed an EF of 55 to 60%, no regional wall motion abnormalities, moderate concentric LVH of the inferior and inferolateral segments, grade 1 diastolic dysfunction, normal RV systolic function and ventricular cavity size, mildly elevated PASP estimated at 37.3 mmHg, mild to moderate mitral regurgitation, mild aortic insufficiency, and an estimated right atrial pressure of 3 mmHg.  She followed up with Dr. Fletcher Cooke on 01/07/2023 and noted improvement in chest pain with stable shortness of breath.  There was concern that some of her symptoms were related to hypertensive heart disease with diastolic dysfunction and mild  pulmonary hypertension.  Given persistently elevated BP, Toprol was transition to carvedilol 6.25 mg twice daily with continuation of losartan 100 mg.  With this, she contacted our office through [facility] over the weekend noting continued elevations in her BP in the 150s over 90s as well as polyarthralgias and some chest heaviness.  She was concerned she was having off target effects of with carvedilol.  In this setting, appointment was scheduled for today.  She comes in today continuing to note elevated BP readings up into the [id number] systolic.  She is without symptoms of angina or decompensation.  She does continue to note some chest heaviness when laying down at night.  At baseline, she has had some knee discomfort for which she previously took meloxicam, though more recently has discontinued this medication.  She feels like her knee pain and generalized arthralgias/myalgias have been exacerbated following the addition of carvedilol.  She also notes headaches more recently.  No significant dyspnea, palpitations, dizziness, presyncope, or syncope.  She does try and watch her sodium intake, not eating out at restaurants frequently.  She will drink 1 or 2 glasses of wine a couple days per week.  She does not smoke.  She drinks 1 to 2 cups of coffee daily.  She is uncertain if she took her losartan this morning.   Labs independently reviewed: 11/2022 - potassium 4.0, BUN 21, serum creatinine 0.82, albumin 4.2, AST/ALT normal, TC 161, TG 111, HDL 56, LDL 82 09/2022 - Hgb 13.9, PLT 231, TSH normal 11/2021 - A1c 5.0  Past Medical History:  Diagnosis Date   Bipolar disorder (Julia Cooke)  takes Abilify   Depression    Dysrhythmia 10/2016   LBBB on ekg...nothing to compare it to.   Heart murmur    Hypertension    Nephrolithiasis 2000   s/p lithotripsy   Psychotic disorder (Julia Cooke)    mania    Past Surgical History:  Procedure Laterality Date   EXTRACORPOREAL SHOCK WAVE LITHOTRIPSY Left 01/06/2017   Procedure:  EXTRACORPOREAL SHOCK WAVE LITHOTRIPSY (ESWL);  Surgeon: Hollice Espy, MD;  Location: [facility] ORS;  Service: Urology;  Laterality: Left;   LITHOTRIPSY  2000    Current Medications: Current Meds  Medication Sig   atorvastatin (LIPITOR) 10 MG tablet Take 1 tablet (10 mg total) by mouth daily.   irbesartan (AVAPRO) 300 MG tablet Take 1 tablet (300 mg total) by mouth daily.   lamoTRIgine (LAMICTAL) 25 MG tablet Take 1 tablet (25 mg total) by mouth 2 (two) times daily.   melatonin 5 MG TABS Take 5 mg by mouth.   metoprolol succinate (TOPROL-XL) 50 MG 24 hr tablet Take 1 tablet (50 mg total) by mouth daily. Take with or immediately following a meal.   omeprazole (PRILOSEC) 20 MG capsule Take 1 capsule (20 mg total) by mouth daily.   traZODone (DESYREL) 50 MG tablet TAKE 1 AND 1/2 TABLETS  TO 2 TABLETS BY MOUTH AT BEDTIME AS NEEDED FOR SLEEP   [DISCONTINUED] carvedilol (COREG) 6.25 MG tablet Take 1 tablet (6.25 mg total) by mouth 2 (two) times daily.   [DISCONTINUED] losartan (COZAAR) 100 MG tablet Take 1 tablet (100 mg total) by mouth daily.    Allergies:   Penicillins   Social History   Socioeconomic History   Marital status: Married    Spouse name: Julia Cooke   Number of children: 3   Years of education: Not on file   Highest education level: Some college, no degree  Occupational History    Comment: retired  Tobacco Use   Smoking status: Never   Smokeless tobacco: Never  Vaping Use   Vaping Use: Never used  Substance and Sexual Activity   Alcohol use: No    Alcohol/week: 0.0 - 2.0 standard drinks of alcohol    Comment: 2-3 times weekly   Drug use: No   Sexual activity: Yes    Birth control/protection: Post-menopausal, None  Other Topics Concern   Not on file  Social History Narrative   Patient lives at home with her husband of 31 years. They have 3 adult children (2 boys, 1 girl). They recently moved back to [place] in January. 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 Patient has also worked as a Scientist, research (medical).   Social Determinants of Health   Financial Resource Strain: Low Risk  (04/30/2021)   Overall Financial Resource Strain (CARDIA)    Difficulty of Paying Living Expenses: Not hard at all  Food Insecurity: No Food Insecurity (04/30/2021)   Hunger Vital Sign    Worried About Running Out of Food in the Last Year: Never true    Ran Out of Food in the Last Year: Never true  Transportation Needs: No Transportation Needs (04/30/2021)   PRAPARE - Hydrologist (Medical): No    Lack of Transportation (Non-Medical): No  Physical Activity: Sufficiently Active (04/30/2021)   Exercise Vital Sign    Days of Exercise per Week: 4 days    Minutes of Exercise per Session: 120 min  Stress: No Stress Concern Present (04/30/2021)   [employer] [place]    Feeling of Stress : Not at all  Social Connections: Socially Integrated (04/30/2021)   Social Connection and Isolation Panel [NHANES]    Frequency of Communication with Friends and Family: More than three times a week    Frequency of Social Gatherings with Friends and Family: More than three times a week    Attends Religious Services: More than 4 times per year    Active Member of [employer] or Organizations: Yes    Attends Music therapist: More than 4 times per year    Marital Status: Married     Family History:  The patient's family history includes Breast cancer (age of onset: 64) in her sister; Cancer in her sister; Fibromyalgia in her sister; Heart Problems in her sister; Heart attack in her brother; Heart disease in her brother; Hyperlipidemia in her father and mother; Hypertension in her brother, father, mother, sister, and sister; Stroke in her mother. There is no history of Prostate cancer, Kidney cancer, or Bladder Cancer.  ROS:   12-point review of systems is negative unless otherwise noted in the  HPI.   EKGs/Labs/Other Studies Reviewed:    Studies reviewed were summarized above. The additional studies were reviewed today:  2D echo 01/04/2023: 1. Left ventricular ejection fraction, by estimation, is 55 to 60%. The  left ventricle has normal function. The left ventricle has no regional  wall motion abnormalities. There is moderate eccentric left ventricular  hypertrophy of the inferior and  infero-lateral segments. Left ventricular diastolic parameters are  consistent with Grade I diastolic dysfunction (impaired relaxation). The  average left ventricular global longitudinal strain is -19.7 %.   2. Right ventricular systolic function is normal. The right ventricular  size is normal. There is mildly elevated pulmonary artery systolic  pressure. The estimated right ventricular systolic pressure is 123XX123 mmHg.   3. The mitral valve is normal in structure. Mild to moderate mitral valve  regurgitation. No evidence of mitral stenosis.   4. The aortic valve is normal in structure. Aortic valve regurgitation is  mild. No aortic stenosis is present.   5. The inferior vena cava is normal in size with greater than 50%  respiratory variability, suggesting right atrial pressure of 3 mmHg.  __________  Coronary CTA 01/03/2023: FINDINGS: Aorta: Normal size. Mild aortic root calcifications. No dissection.   Aortic Valve:  Trileaflet.  No calcifications.   Coronary Arteries:  Normal coronary origin.  Right dominance.   RCA is a dominant artery that gives rise to PDA and PLA. There is no plaque.   Left main gives rise to LAD and LCX arteries.  LM has no disease.   LAD has no plaque.   LCX is a non-dominant artery.  There is no plaque.   Other findings:   Normal pulmonary vein drainage into the left atrium.   Normal left atrial appendage without a thrombus.   Normal size of the pulmonary artery.   IMPRESSION: 1. Normal coronary calcium score of 0.  Patient is low risk. 2. Normal  coronary origin with right dominance. 3. No evidence of CAD. 4. CAD-RADS 0. Consider non-atherosclerotic causes of chest pain. __________  Carlton Adam MPI 12/29/2016: Pharmacological myocardial perfusion imaging study with no significant  ischemia Septal wall hypokinesis,(possibly secondary to conduction abnormality) , EF estimated at 49%  No EKG changes concerning for ischemia at peak stress or in recovery. Resting EKG with intraventricular conduction delay Low risk scan __________  2D echo  12/08/2016: - Left ventricle: The cavity size was normal. Wall thickness was at    the upper limits of normal. Systolic function was normal. The    estimated ejection fraction was in the range of 55% to 60%. Wall    motion was normal; there were no regional wall motion    abnormalities. Doppler parameters are consistent with abnormal    left ventricular relaxation (grade 1 diastolic dysfunction).    Doppler parameters are consistent with high ventricular filling    pressure.  - Right ventricle: The cavity size was normal. Systolic function    was normal.  - Pulmonic valve: There was mild to moderate regurgitation.   Impressions:   - 1. Normal LV size and contraction. Grade 1 diastolic dysfunction.    2. Normal RV size and function.    3. Mild to moderate pulmonic regurgitation. Otherwise, no    significant valvular abnormalities.    EKG:  EKG is not ordered today.    Recent Labs: 12/06/2022: BUN 21; Creatinine, Ser 0.82; Potassium 4.0; Sodium 138  Recent Lipid Panel    Component Value Date/Time   CHOL 203 (H) 12/01/2021 1105   TRIG 83.0 12/01/2021 1105   HDL 48.80 12/01/2021 1105   CHOLHDL 4 12/01/2021 1105   VLDL 16.6 12/01/2021 1105   LDLCALC 138 (H) 12/01/2021 1105   LDLDIRECT 162.0 12/23/2016 1135    PHYSICAL EXAM:    VS:  BP (!) 180/96 (BP Location: Left Arm, Patient Position: Sitting, Cuff Size: Normal)   Pulse 84   Ht 5' (1.524 m)   Wt 172 lb 3.2 oz (78.1 kg)   SpO2 96%    BMI 33.63 kg/m   BMI: Body mass index is 33.63 kg/m.  Physical Exam Vitals reviewed.  Constitutional:      Appearance: She is well-developed.  HENT:     Head: Normocephalic and atraumatic.  Eyes:     General:        Right eye: No discharge.        Left eye: No discharge.  Neck:     Vascular: No JVD.  Cardiovascular:     Rate and Rhythm: Normal rate and regular rhythm.     Heart sounds: Normal heart sounds, S1 normal and S2 normal. Heart sounds not distant. No midsystolic click and no opening snap. No murmur heard.    No friction rub.  Pulmonary:     Effort: Pulmonary effort is normal. No respiratory distress.     Breath sounds: Normal breath sounds. No decreased breath sounds, wheezing or rales.  Chest:     Chest wall: No tenderness.  Abdominal:     General: There is no distension.  Musculoskeletal:     Cervical back: Normal range of motion.  Skin:    General: Skin is warm and dry.     Nails: There is no clubbing.  Neurological:     Mental Status: She is alert and oriented to person, place, and time.  Psychiatric:        Speech: Speech normal.        Behavior: Behavior normal.        Thought Content: Thought content normal.        Judgment: Judgment normal.     Wt Readings from Last 3 Encounters:  01/18/23 172 lb 3.2 oz (78.1 kg)  01/07/23 171 lb 8 oz (77.8 kg)  11/11/22 170 lb (77.1 kg)     ASSESSMENT & PLAN:   HTN: Blood pressure remains elevated.  It appears the addition of carvedilol, from metoprolol, has led to an exacerbation of myalgias/arthralgias.  We will transition her from carvedilol back to Toprol-XL at the titrated dose to 50 mg daily.  We will also transition her from losartan 100 mg to irbesartan 300 mg daily.  Low-sodium diet is encouraged.  She will contact our office in 2 weeks with BP and heart rate readings with recommendation to escalate antihypertensive therapy as indicated.  If, moving forward, despite escalation of antihypertensive therapy  she continues to have elevated BP readings, may need to consider renal artery ultrasound along with laboratory evaluation for secondary hypertension.  Exertional dyspnea/chest pressure: Coronary CTA showed no evidence of CAD.  Possibly related to some degree of volume overload in the setting of hypertensive heart disease.  Escalate antihypertensive therapy as outlined above.  Following improvement in blood pressure readings, if symptoms persist, may need to consider RHC to better understand her hemodynamics given elevated RVSP on echo.  Would attempt to avoid empiric loop diuretic at this time in an effort to minimize risk of renal dysfunction.  Mild to moderate mitral regurgitation: Follow with periodic echo.  HLD: LDL 82 in 11/2022.  She remains on atorvastatin.   Disposition: F/u with Dr. Fletcher Cooke or an APP in 1 month.   Medication Adjustments/Labs and Tests Ordered: Current medicines are reviewed at length with the patient today.  Concerns regarding medicines are outlined above. Medication changes, Labs and Tests ordered today are summarized above and listed in the Patient Instructions accessible in Encounters.   Signed, Christell Faith, PA-C 01/18/2023 5:21 PM     [address] [place], [place] [address] [phone]

## 2023-01-18 ENCOUNTER — Ambulatory Visit: Payer: Medicare HMO | Attending: Physician Assistant | Admitting: Physician Assistant

## 2023-01-18 ENCOUNTER — Encounter: Payer: Self-pay | Admitting: Physician Assistant

## 2023-01-18 VITALS — BP 180/96 | HR 84 | Ht 60.0 in | Wt 172.2 lb

## 2023-01-18 DIAGNOSIS — R079 Chest pain, unspecified: Secondary | ICD-10-CM | POA: Diagnosis not present

## 2023-01-18 DIAGNOSIS — I10 Essential (primary) hypertension: Secondary | ICD-10-CM

## 2023-01-18 DIAGNOSIS — E785 Hyperlipidemia, unspecified: Secondary | ICD-10-CM | POA: Diagnosis not present

## 2023-01-18 DIAGNOSIS — I340 Nonrheumatic mitral (valve) insufficiency: Secondary | ICD-10-CM

## 2023-01-18 DIAGNOSIS — R0602 Shortness of breath: Secondary | ICD-10-CM

## 2023-01-18 DIAGNOSIS — M2550 Pain in unspecified joint: Secondary | ICD-10-CM

## 2023-01-18 MED ORDER — IRBESARTAN 300 MG PO TABS
300.0000 mg | ORAL_TABLET | Freq: Every day | ORAL | 3 refills | Status: DC
  Filled 2023-06-24 – 2023-11-07 (×3): qty 90, 90d supply, fill #0

## 2023-01-18 MED ORDER — METOPROLOL SUCCINATE ER 50 MG PO TB24: 50.0000 mg | ORAL_TABLET | Freq: Every day | ORAL | 3 refills | Status: DC

## 2023-01-18 NOTE — Patient Instructions (Signed)
Medication Instructions:  Your physician has recommended you make the following change in your medication:   STOP Losartan STOP Cardizem START Irbesartan 300 mg once daily  START Toprol XL 50 mg once daily   Please monitor blood pressures and 2 weeks then send to [place] by My Chart. Keep a log of your readings.   Make sure to check 2 hours after your medications.   AVOID these things for 30 minutes before checking your blood pressure: No Drinking caffeine. No Drinking alcohol. No Eating. No Smoking. No Exercising.  Five minutes before checking your blood pressure: Pee. Sit in a dining chair. Avoid sitting in a soft couch or armchair. Be quiet. Do not talk.   *If you need a refill on your cardiac medications before your next appointment, please call your pharmacy*   Lab Work: None  If you have labs (blood work) drawn today and your tests are completely normal, you will receive your results only by: [place] (if you have MyChart) OR A paper copy in the mail If you have any lab test that is abnormal or we need to change your treatment, we will call you to review the results.   Testing/Procedures: None   Follow-Up: At [facility], you and your health needs are our priority.  As part of our continuing mission to provide you with exceptional heart care, we have created designated Provider Care Teams.  These Care Teams include your primary Cardiologist (physician) and Advanced Practice Providers (APPs -  Physician Assistants and Nurse Practitioners) who all work together to provide you with the care you need, when you need it.   Your next appointment:   1 month(s)  Provider:   Kathlyn Sacramento, MD or Christell Faith, PA-C

## 2023-01-20 ENCOUNTER — Ambulatory Visit: Payer: Medicare (Managed Care) | Admitting: Psychiatry

## 2023-01-31 ENCOUNTER — Encounter: Payer: Self-pay | Admitting: Internal Medicine

## 2023-01-31 ENCOUNTER — Ambulatory Visit (INDEPENDENT_AMBULATORY_CARE_PROVIDER_SITE_OTHER): Payer: Medicare HMO | Admitting: Internal Medicine

## 2023-01-31 VITALS — BP 122/62 | HR 86 | Temp 98.5°F | Ht 60.0 in | Wt 169.4 lb

## 2023-01-31 DIAGNOSIS — I10 Essential (primary) hypertension: Secondary | ICD-10-CM | POA: Diagnosis not present

## 2023-01-31 DIAGNOSIS — E785 Hyperlipidemia, unspecified: Secondary | ICD-10-CM

## 2023-01-31 DIAGNOSIS — Z6833 Body mass index (BMI) 33.0-33.9, adult: Secondary | ICD-10-CM

## 2023-01-31 DIAGNOSIS — M8589 Other specified disorders of bone density and structure, multiple sites: Secondary | ICD-10-CM | POA: Diagnosis not present

## 2023-01-31 DIAGNOSIS — Z1211 Encounter for screening for malignant neoplasm of colon: Secondary | ICD-10-CM | POA: Diagnosis not present

## 2023-01-31 DIAGNOSIS — F5101 Primary insomnia: Secondary | ICD-10-CM | POA: Diagnosis not present

## 2023-01-31 DIAGNOSIS — I447 Left bundle-branch block, unspecified: Secondary | ICD-10-CM | POA: Diagnosis not present

## 2023-01-31 DIAGNOSIS — Z1231 Encounter for screening mammogram for malignant neoplasm of breast: Secondary | ICD-10-CM

## 2023-01-31 DIAGNOSIS — F3170 Bipolar disorder, currently in remission, most recent episode unspecified: Secondary | ICD-10-CM

## 2023-01-31 DIAGNOSIS — M2550 Pain in unspecified joint: Secondary | ICD-10-CM | POA: Diagnosis not present

## 2023-01-31 DIAGNOSIS — R69 Illness, unspecified: Secondary | ICD-10-CM | POA: Diagnosis not present

## 2023-01-31 DIAGNOSIS — E6609 Other obesity due to excess calories: Secondary | ICD-10-CM | POA: Diagnosis not present

## 2023-01-31 DIAGNOSIS — E66811 Obesity, class 1: Secondary | ICD-10-CM

## 2023-01-31 LAB — COMPREHENSIVE METABOLIC PANEL
ALT: 16 U/L (ref 0–35)
AST: 17 U/L (ref 0–37)
Albumin: 4.3 g/dL (ref 3.5–5.2)
Alkaline Phosphatase: 82 U/L (ref 39–117)
BUN: 23 mg/dL (ref 6–23)
CO2: 30 mEq/L (ref 19–32)
Calcium: 10.4 mg/dL (ref 8.4–10.5)
Chloride: 100 mEq/L (ref 96–112)
Creatinine, Ser: 0.82 mg/dL (ref 0.40–1.20)
GFR: 72.11 mL/min (ref >60.00)
Glucose, Bld: 93 mg/dL (ref 70–99)
Potassium: 5 mEq/L (ref 3.5–5.1)
Sodium: 138 mEq/L (ref 135–145)
Total Bilirubin: 1.2 mg/dL (ref 0.2–1.2)
Total Protein: 7.2 g/dL (ref 6.0–8.3)

## 2023-01-31 LAB — C-REACTIVE PROTEIN: CRP: <1 mg/dL (ref 0.5–20.0)

## 2023-01-31 MED ORDER — AMLODIPINE BESYLATE 2.5 MG PO TABS: 2.5000 mg | ORAL_TABLET | Freq: Every day | ORAL | 1 refills | Status: DC

## 2023-01-31 NOTE — Assessment & Plan Note (Signed)
Follow up with psychiatry for diagnosis review

## 2023-01-31 NOTE — Assessment & Plan Note (Signed)
She has no synovitis on exam bt at her request due to FH of autoimmuni disesase   Checking ESR, CRP and ANA

## 2023-01-31 NOTE — Progress Notes (Signed)
Subjective:  Patient ID: Julia Cooke, female    DOB: 1950/12/22  Age: 72 y.o. MRN: [id number]  CC: The primary encounter diagnosis was Colon cancer screening. Diagnoses of Osteopenia of multiple sites, Hyperlipidemia LDL goal <100, LBBB (left bundle branch block), Breast cancer screening by mammogram, Polyarthralgia, Essential hypertension, Class 1 obesity due to excess calories without serious comorbidity with body mass index (BMI) of 33.0 to 33.9 in adult, Primary insomnia, and Bipolar disorder in partial remission, most recent episode unspecified type [facility]) were also pertinent to this visit.   HPI Lataya Sollars presents for  Chief Complaint  Patient presents with   Medical Management of Chronic Issues    Re-establish care   1) HTN:  home readings have been ranged from [id number] to 0000000 systolic at night on home machine.  Taking irbesartan 300 mg, metoprolol 50 mg .  Advised by cardiology to check BP twice daily  but hasn't been doing that   2) Insomnia ; secondary to body aches  trazodone causing dry mouth . Wakes up with stiff neck,  joints aching. No regular exercise for the past 3 months   3) seeing Psychiatrist for bipolar: grandaughter (med student) thinks she has been misdiagnosed and actually has ADHD  4) Joint pain :  midfoot and forefoot pain  aggravated by standing but hurts "all the time"   neck pain hand pain   Outpatient Medications Prior to Visit  Medication Sig Dispense Refill   atorvastatin (LIPITOR) 10 MG tablet Take 1 tablet (10 mg total) by mouth daily. 90 tablet 0   irbesartan (AVAPRO) 300 MG tablet Take 1 tablet (300 mg total) by mouth daily. 90 tablet 3   lamoTRIgine (LAMICTAL) 25 MG tablet Take 1 tablet (25 mg total) by mouth 2 (two) times daily. 180 tablet 1   melatonin 5 MG TABS Take 5 mg by mouth.     metoprolol succinate (TOPROL-XL) 50 MG 24 hr tablet Take 1 tablet (50 mg total) by mouth daily. Take with or immediately following a meal. 90 tablet 3    omeprazole (PRILOSEC) 20 MG capsule Take 1 capsule (20 mg total) by mouth daily. 30 capsule 3   traZODone (DESYREL) 50 MG tablet TAKE 1 AND 1/2 TABLETS  TO 2 TABLETS BY MOUTH AT BEDTIME AS NEEDED FOR SLEEP 180 tablet 1   ARIPiprazole (ABILIFY) 2 MG tablet Take 1 tablet (2 mg total) by mouth in the morning. (Patient not taking: Reported on 01/18/2023) 30 tablet 1   No facility-administered medications prior to visit.    Review of Systems;  Patient denies headache, fevers, malaise, unintentional weight loss, skin rash, eye pain, sinus congestion and sinus pain, sore throat, dysphagia,  hemoptysis , cough, dyspnea, wheezing, chest pain, palpitations, orthopnea, edema, abdominal pain, nausea, melena, diarrhea, constipation, flank pain, dysuria, hematuria, urinary  Frequency, nocturia, numbness, tingling, seizures,  Focal weakness, Loss of consciousness,  Tremor, insomnia, depression, anxiety, and suicidal ideation.      Objective:  BP 122/62   Pulse 86   Temp 98.5 F (36.9 C) (Oral)   Ht 5' (1.524 m)   Wt 169 lb 6.4 oz (76.8 kg)   SpO2 96%   BMI 33.08 kg/m   BP Readings from Last 3 Encounters:  01/31/23 122/62  01/18/23 (!) 180/96  01/07/23 (!) 150/82    Wt Readings from Last 3 Encounters:  01/31/23 169 lb 6.4 oz (76.8 kg)  01/18/23 172 lb 3.2 oz (78.1 kg)  01/07/23 171 lb 8 oz (  77.8 kg)    Physical Exam Vitals reviewed.  Constitutional:      General: She is not in acute distress.    Appearance: Normal appearance. She is normal weight. She is not ill-appearing, toxic-appearing or diaphoretic.  HENT:     Head: Normocephalic.  Eyes:     General: No scleral icterus.       Right eye: No discharge.        Left eye: No discharge.     Conjunctiva/sclera: Conjunctivae normal.  Cardiovascular:     Rate and Rhythm: Normal rate and regular rhythm.     Heart sounds: Normal heart sounds.  Pulmonary:     Effort: Pulmonary effort is normal. No respiratory distress.     Breath sounds:  Normal breath sounds.  Musculoskeletal:        General: Normal range of motion.  Skin:    General: Skin is warm and dry.  Neurological:     General: No focal deficit present.     Mental Status: She is alert and oriented to person, place, and time. Mental status is at baseline.  Psychiatric:        Mood and Affect: Mood normal.        Behavior: Behavior normal.        Thought Content: Thought content normal.        Judgment: Judgment normal.   Lab Results  Component Value Date   HGBA1C 5.0 12/01/2021   HGBA1C 4.8 06/11/2020   HGBA1C 4.9 01/20/2018    Lab Results  Component Value Date   CREATININE 0.82 12/06/2022   CREATININE 0.79 12/01/2021   CREATININE 0.79 04/08/2021    Lab Results  Component Value Date   WBC 5.7 12/01/2021   HGB 13.1 12/01/2021   HCT 39.3 12/01/2021   PLT 210.0 12/01/2021   GLUCOSE 96 12/06/2022   CHOL 203 (H) 12/01/2021   TRIG 83.0 12/01/2021   HDL 48.80 12/01/2021   LDLDIRECT 162.0 12/23/2016   LDLCALC 138 (H) 12/01/2021   ALT 13 12/01/2021   AST 16 12/01/2021   NA 138 12/06/2022   K 4.0 12/06/2022   CL 106 12/06/2022   CREATININE 0.82 12/06/2022   BUN 21 12/06/2022   CO2 25 12/06/2022   TSH 0.89 12/01/2021   HGBA1C 5.0 12/01/2021   MICROALBUR 0.7 12/23/2016    ECHOCARDIOGRAM COMPLETE  Result Date: 01/05/2023    ECHOCARDIOGRAM REPORT   Patient Name:   Julia Cooke Date of Exam: 01/04/2023 Medical Rec #:  [id number]      Height:       60.0 in Accession #:    [id number]     Weight:       172.6 lb Date of Birth:  07/16/1951      BSA:          1.753 m Patient Age:    78 years       BP:           152/98 mmHg Patient Gender: F              HR:           76 bpm. Exam Location:  [facility] Procedure: 2D Echo, 3D Echo, Cardiac Doppler, Color Doppler and Strain Analysis Indications:    R06.02 SOB  History:        Patient has prior history of Echocardiogram examinations, most                 recent 12/08/2016. Arrythmias:LBBB, Signs/Symptoms:Shortness  of                  Breath; Risk Factors:Hypertension, Dyslipidemia and Non-Smoker.  Sonographer:    Pilar Jarvis RDMS, RVT, RDCS Referring Phys: [place]  1. Left ventricular ejection fraction, by estimation, is 55 to 60%. The left ventricle has normal function. The left ventricle has no regional wall motion abnormalities. There is moderate eccentric left ventricular hypertrophy of the inferior and infero-lateral segments. Left ventricular diastolic parameters are consistent with Grade I diastolic dysfunction (impaired relaxation). The average left ventricular global longitudinal strain is -19.7 %.  2. Right ventricular systolic function is normal. The right ventricular size is normal. There is mildly elevated pulmonary artery systolic pressure. The estimated right ventricular systolic pressure is 123XX123 mmHg.  3. The mitral valve is normal in structure. Mild to moderate mitral valve regurgitation. No evidence of mitral stenosis.  4. The aortic valve is normal in structure. Aortic valve regurgitation is mild. No aortic stenosis is present.  5. The inferior vena cava is normal in size with greater than 50% respiratory variability, suggesting right atrial pressure of 3 mmHg. FINDINGS  Left Ventricle: Left ventricular ejection fraction, by estimation, is 55 to 60%. The left ventricle has normal function. The left ventricle has no regional wall motion abnormalities. The average left ventricular global longitudinal strain is -19.7 %. The left ventricular internal cavity size was normal in size. There is moderate eccentric left ventricular hypertrophy of the inferior and infero-lateral segments. Left ventricular diastolic parameters are consistent with Grade I diastolic dysfunction (impaired relaxation). Right Ventricle: The right ventricular size is normal. No increase in right ventricular wall thickness. Right ventricular systolic function is normal. There is mildly elevated pulmonary artery systolic  pressure. The tricuspid regurgitant velocity is 2.84  m/s, and with an assumed right atrial pressure of 5 mmHg, the estimated right ventricular systolic pressure is 123XX123 mmHg. Left Atrium: Left atrial size was normal in size. Right Atrium: Right atrial size was normal in size. Pericardium: There is no evidence of pericardial effusion. Mitral Valve: The mitral valve is normal in structure. Mild to moderate mitral valve regurgitation. No evidence of mitral valve stenosis. Tricuspid Valve: The tricuspid valve is normal in structure. Tricuspid valve regurgitation is mild . No evidence of tricuspid stenosis. Aortic Valve: The aortic valve is normal in structure. Aortic valve regurgitation is mild. Aortic regurgitation PHT measures 455 msec. No aortic stenosis is present. Aortic valve mean gradient measures 5.0 mmHg. Aortic valve peak gradient measures 8.5 mmHg. Aortic valve area, by VTI measures 1.91 cm. Pulmonic Valve: The pulmonic valve was normal in structure. Pulmonic valve regurgitation is mild. No evidence of pulmonic stenosis. Aorta: The aortic root is normal in size and structure. Venous: The inferior vena cava is normal in size with greater than 50% respiratory variability, suggesting right atrial pressure of 3 mmHg. IAS/Shunts: No atrial level shunt detected by color flow Doppler.  LEFT VENTRICLE PLAX 2D LVIDd:         4.50 cm     Diastology LVIDs:         3.00 cm     LV e' medial:    5.11 cm/s LV PW:         1.00 cm     LV E/e' medial:  18.1 LV IVS:        1.10 cm     LV e' lateral:   9.90 cm/s LVOT diam:     1.80 cm  LV E/e' lateral: 9.3 LV SV:         56 LV SV Index:   32          2D Longitudinal Strain LVOT Area:     2.54 cm    2D Strain GLS Avg:     -19.7 %  LV Volumes (MOD) LV vol d, MOD A2C: 85.4 ml LV vol d, MOD A4C: 89.6 ml LV vol s, MOD A2C: 35.4 ml LV vol s, MOD A4C: 38.5 ml LV SV MOD A2C:     50.0 ml LV SV MOD A4C:     89.6 ml LV SV MOD BP:      51.8 ml RIGHT VENTRICLE             IVC RV Basal  diam:  3.20 cm     IVC diam: 1.50 cm RV S prime:     10.60 cm/s TAPSE (M-mode): 2.6 cm      PULMONARY VEINS                             Systolic Velocity: [id number] cm/s LEFT ATRIUM             Index        RIGHT ATRIUM           Index LA diam:        4.00 cm 2.28 cm/m   RA Area:     12.20 cm LA Vol (A2C):   58.9 ml 33.59 ml/m  RA Volume:   25.10 ml  14.32 ml/m LA Vol (A4C):   48.0 ml 27.38 ml/m LA Biplane Vol: 57.7 ml 32.91 ml/m  AORTIC VALVE                     PULMONIC VALVE AV Area (Vmax):    1.76 cm      PV Vmax:       1.67 m/s AV Area (Vmean):   1.66 cm      PV Peak grad:  11.2 mmHg AV Area (VTI):     1.91 cm AV Vmax:           146.00 cm/s AV Vmean:          106.000 cm/s AV VTI:            0.294 m AV Peak Grad:      8.5 mmHg AV Mean Grad:      5.0 mmHg LVOT Vmax:         101.00 cm/s LVOT Vmean:        69.300 cm/s LVOT VTI:          0.221 m LVOT/AV VTI ratio: 0.75 AI PHT:            455 msec  AORTA Ao Root diam: 2.50 cm Ao Asc diam:  2.60 cm Ao Arch diam: 2.3 cm MITRAL VALVE                TRICUSPID VALVE MV Area (PHT): 3.17 cm     TR Peak grad:   32.3 mmHg MV Decel Time: 239 msec     TR Vmax:        284.00 cm/s MV E velocity: 92.50 cm/s MV A velocity: 134.00 cm/s  SHUNTS MV E/A ratio:  0.69         Systemic VTI:  0.22 m  Systemic Diam: 1.80 cm Ida Rogue MD Electronically signed by Ida Rogue MD Signature Date/Time: 01/05/2023/12:49:19 PM    Final    CT CORONARY MORPH W/CTA COR W/SCORE Lewanda Rife W/CM &/OR WO/CM  Addendum Date: 01/04/2023   ADDENDUM REPORT: 01/04/2023 00:04 EXAM: OVER-READ INTERPRETATION  CT CHEST The following report is an over-read performed by radiologist Dr. Collene Leyden [facility] on 01/04/2023. This over-read does not include interpretation of cardiac or coronary anatomy or pathology. The coronary CTA interpretation by the cardiologist is attached. COMPARISON:  None. FINDINGS: Heart is normal size. Aorta normal caliber. No adenopathy. No  confluent opacities or effusions. No acute findings in the upper abdomen. Chest wall soft tissues are unremarkable. No acute bony abnormality. IMPRESSION: No acute or significant extracardiac abnormality. Electronically Signed   By: Rolm Baptise M.D.   On: 01/04/2023 00:04   Result Date: 01/04/2023 CLINICAL DATA:  Chest pain, shortness of breath EXAM: Cardiac/Coronary  CTA TECHNIQUE: The patient was scanned on a Siemens Somatom go.Top scanner. : A retrospective scan was triggered in the descending thoracic aorta. Axial non-contrast 3 mm slices were carried out through the heart. The data set was analyzed on a dedicated work station and scored using the [place]. Gantry rotation speed was 330 msecs and collimation was .6 mm. '100mg'$  of metoprolol and 0.8 mg of sl NTG was given. The 3D data set was reconstructed in 5% intervals of the 60-95 % of the R-R cycle. Diastolic phases were analyzed on a dedicated work station using MPR, MIP and VRT modes. The patient received 100 cc of contrast. FINDINGS: Aorta: Normal size. Mild aortic root calcifications. No dissection. Aortic Valve:  Trileaflet.  No calcifications. Coronary Arteries:  Normal coronary origin.  Right dominance. RCA is a dominant artery that gives rise to PDA and PLA. There is no plaque. Left main gives rise to LAD and LCX arteries.  LM has no disease. LAD has no plaque. LCX is a non-dominant artery.  There is no plaque. Other findings: Normal pulmonary vein drainage into the left atrium. Normal left atrial appendage without a thrombus. Normal size of the pulmonary artery. IMPRESSION: 1. Normal coronary calcium score of 0.  Patient is low risk. 2. Normal coronary origin with right dominance. 3. No evidence of CAD. 4. CAD-RADS 0. Consider non-atherosclerotic causes of chest pain. Electronically Signed: By: Kate Sable M.D. On: 01/03/2023 12:38    Assessment & Plan:  .Colon cancer screening  Osteopenia of multiple sites Assessment &  Plan: Most recent DXA from 05/2022 continued to show osteopenia with T-scores of -0.6 (lumbar spine), -2.0 (left hip femoral neck), and -0.7 (left total hip). She is taking vitamin D supplements but not consistently.    Hyperlipidemia LDL goal <100 Assessment & Plan: Based on current lipid profile, the risk of clinically significant CAD is 12 to 16% over the next 10 years, using the Framingham risk calculator. She has a strong FH of early CAD and CVD in family members who smoked. She is taking atorvastatin 10 mg daily   She underwent screening for CAD with coronary calcium CT : score was zero:  MPRESSION: 1. Normal coronary calcium score of 0.  Patient is low risk.   2. Normal coronary origin with right dominance.   3. No evidence of CAD.   Lab Results  Component Value Date   CHOL 203 (H) 12/01/2021   HDL 48.80 12/01/2021   LDLCALC 138 (H) 12/01/2021   LDLDIRECT 162.0 12/23/2016   TRIG 83.0 12/01/2021  CHOLHDL 4 12/01/2021      LBBB (left bundle branch block) Assessment & Plan: Reviewed cardiac evaluation given reports of chest pain and dyspnea by Dr Fletcher Anon.  Coronary calcium CT scan was normal.    Breast cancer screening by mammogram -     3D Screening Mammogram, Left and Right; Future  Polyarthralgia Assessment & Plan: She has no synovitis on exam bt at her request due to [place] of autoimmuni disesase   Checking ESR, CRP and ANA   Orders: -     Sedimentation rate -     C-reactive protein -     ANA  Essential hypertension Assessment & Plan: Adding amlodipine to current regimen of metoprolol and irbesartan  Orders: -     Comprehensive metabolic panel  Class 1 obesity due to excess calories without serious comorbidity with body mass index (BMI) of 33.0 to 33.9 in adult Assessment & Plan: I have congratulated her in reduction of  BMI and encouraged  Continued weight loss with goal of 10% of body weigh over the next 6 months using a low glycemic index diet and regular  exercise a minimum of 5 days per week.     Primary insomnia Assessment & Plan: No improvement despite increase in  trazodone to 100 mg qhs.  Recommend adding relaxium    Bipolar disorder in partial remission, most recent episode unspecified type [facility]) Assessment & Plan: Follow up with psychiatry for diagnosis review    Other orders -     amLODIPine Besylate; Take 1 tablet (2.5 mg total) by mouth at bedtime.  Dispense: 90 tablet; Refill: 1     I provided 30 minutes of face-to-face time during this encounter reviewing patient's last visit with me, patient's  most recent visit with cardiology,  orthopedics and psychiatry ,  recent surgical and non surgical procedures, previous  labs and imaging studies, counseling on currently addressed issues,  and post visit ordering to diagnostics and therapeutics .   Follow-up: Return in about 4 weeks (around 02/28/2023) for hypertension, chronic pain management.   Crecencio Mc, MD

## 2023-01-31 NOTE — Assessment & Plan Note (Signed)
I have congratulated her in reduction of   BMI and encouraged  Continued weight loss with goal of 10% of body weigh over the next 6 months using a low glycemic index diet and regular exercise a minimum of 5 days per week.    

## 2023-01-31 NOTE — Assessment & Plan Note (Signed)
No improvement despite increase in  trazodone to 100 mg qhs.  Recommend adding relaxium

## 2023-01-31 NOTE — Assessment & Plan Note (Addendum)
Based on current lipid profile, the risk of clinically significant CAD is 12 to 16% over the next 10 years, using the Framingham risk calculator. She has a strong FH of early CAD and CVD in family members who smoked. She is taking atorvastatin 10 mg daily   She underwent screening for CAD with coronary calcium CT : score was zero:  MPRESSION: 1. Normal coronary calcium score of 0.  Patient is low risk.   2. Normal coronary origin with right dominance.   3. No evidence of CAD.   Lab Results  Component Value Date   CHOL 203 (H) 12/01/2021   HDL 48.80 12/01/2021   LDLCALC 138 (H) 12/01/2021   LDLDIRECT 162.0 12/23/2016   TRIG 83.0 12/01/2021   CHOLHDL 4 12/01/2021

## 2023-01-31 NOTE — Assessment & Plan Note (Signed)
Reviewed cardiac evaluation given reports of chest pain and dyspnea by Dr Fletcher Anon.  Coronary calcium CT scan was normal.

## 2023-01-31 NOTE — Assessment & Plan Note (Signed)
Adding amlodipine to current regimen of metoprolol and irbesartan

## 2023-01-31 NOTE — Patient Instructions (Addendum)
You are due for your Medicare Annual Wellness visit, please schedule this appointment at checkout.    I am adding amlodipine 2.5 mg daily in the evening to help lower your blood pressure. Continue metoprolol and irbesartan in the morning   Take 1000 mg tylenol at bedtime  for the joint pain .  Labs today to rule out inflammatory arthritis    You might want to try using Relaxium for insomnia  (as seen on TV commercials) . It is available through [employer] and contains all natural supplements:  Melatonin 5 mg  Chamomile 25 mg Passionflower extract 75 mg GABA 100 mg Ashwaganda extract 125 mg Magnesium citrate, glycinate, oxide (100 mg)  L tryptophan 500 mg Valerest (proprietary  ingredient ; probably valeria root extract)

## 2023-01-31 NOTE — Assessment & Plan Note (Signed)
Most recent DXA from 05/2022 continued to show osteopenia with T-scores of -0.6 (lumbar spine), -2.0 (left hip femoral neck), and -0.7 (left total hip). She is taking vitamin D supplements but not consistently.

## 2023-02-01 DIAGNOSIS — D2272 Melanocytic nevi of left lower limb, including hip: Secondary | ICD-10-CM | POA: Diagnosis not present

## 2023-02-01 DIAGNOSIS — Z08 Encounter for follow-up examination after completed treatment for malignant neoplasm: Secondary | ICD-10-CM | POA: Diagnosis not present

## 2023-02-01 DIAGNOSIS — D2271 Melanocytic nevi of right lower limb, including hip: Secondary | ICD-10-CM | POA: Diagnosis not present

## 2023-02-01 DIAGNOSIS — L821 Other seborrheic keratosis: Secondary | ICD-10-CM | POA: Diagnosis not present

## 2023-02-01 DIAGNOSIS — D225 Melanocytic nevi of trunk: Secondary | ICD-10-CM | POA: Diagnosis not present

## 2023-02-01 DIAGNOSIS — D2261 Melanocytic nevi of right upper limb, including shoulder: Secondary | ICD-10-CM | POA: Diagnosis not present

## 2023-02-01 DIAGNOSIS — Z85828 Personal history of other malignant neoplasm of skin: Secondary | ICD-10-CM | POA: Diagnosis not present

## 2023-02-01 DIAGNOSIS — D2262 Melanocytic nevi of left upper limb, including shoulder: Secondary | ICD-10-CM | POA: Diagnosis not present

## 2023-02-01 LAB — SEDIMENTATION RATE: Sed Rate: 23 mm/hr (ref 0–30)

## 2023-02-01 LAB — ANA: Anti Nuclear Antibody (ANA): NEGATIVE

## 2023-02-11 DIAGNOSIS — M2012 Hallux valgus (acquired), left foot: Secondary | ICD-10-CM | POA: Diagnosis not present

## 2023-02-11 DIAGNOSIS — M2022 Hallux rigidus, left foot: Secondary | ICD-10-CM | POA: Diagnosis not present

## 2023-02-11 DIAGNOSIS — M2042 Other hammer toe(s) (acquired), left foot: Secondary | ICD-10-CM | POA: Diagnosis not present

## 2023-02-15 ENCOUNTER — Other Ambulatory Visit: Payer: Self-pay | Admitting: Podiatry

## 2023-02-21 ENCOUNTER — Encounter: Payer: Self-pay | Admitting: Psychiatry

## 2023-02-21 ENCOUNTER — Telehealth (INDEPENDENT_AMBULATORY_CARE_PROVIDER_SITE_OTHER): Payer: Medicare HMO | Admitting: Psychiatry

## 2023-02-21 DIAGNOSIS — G4701 Insomnia due to medical condition: Secondary | ICD-10-CM

## 2023-02-21 DIAGNOSIS — F3178 Bipolar disorder, in full remission, most recent episode mixed: Secondary | ICD-10-CM | POA: Diagnosis not present

## 2023-02-21 DIAGNOSIS — R69 Illness, unspecified: Secondary | ICD-10-CM | POA: Diagnosis not present

## 2023-02-21 NOTE — Progress Notes (Unsigned)
Virtual Visit via Video Note  I connected with Julia Cooke on 02/21/23 at  3:00 PM EDT by a video enabled telemedicine application and verified that I am speaking with the correct person using two identifiers.  Location Provider Location : Remote Office Patient Location : Home  Participants: Patient , Provider    I discussed the limitations of evaluation and management by telemedicine and the availability of in person appointments. The patient expressed understanding and agreed to proceed.   I discussed the assessment and treatment plan with the patient. The patient was provided an opportunity to ask questions and all were answered. The patient agreed with the plan and demonstrated an understanding of the instructions.   The patient was advised to call back or seek an in-person evaluation if the symptoms worsen or if the condition fails to improve as anticipated.  [place] MD OP Progress Note  02/22/2023 7:41 AM Julia Cooke  MRN:  [id number]  Chief Complaint:  Chief Complaint  Patient presents with   Follow-up   Manic Behavior   Medication Refill   HPI: Julia Cooke is a 72 year old Caucasian female, married, on SSI, lives in [place], has a history of bipolar disorder was evaluated by telemedicine today.  Patient today reports overall she is doing fairly well with regards to her mood.  Denies any significant mood lability.  Denies any significant mania or hypomanic symptoms.  Reports she stopped taking the Abilify.  She is currently compliant on the Lamictal.  Would like to stay on the Lamictal.  Denies side effects.  Patient reports sleep is restless due to lack of sleep hygiene.  She reports she falls asleep on her couch reading her book and then wakes up at around 2 or 3 AM and is up for another hour before she can fall asleep.  She however is not interested in medication readjustments.  She would like to work on her sleep hygiene.  She also is noncompliant on the trazodone  due to dry mouth.  Patient reports she is currently struggling with left toe arthritis, pain, has upcoming surgery scheduled in April.  Patient denies any suicidality, homicidality or perceptual disturbances.  Patient denies any other concerns today.  Visit Diagnosis:    ICD-10-CM   1. Bipolar disorder, in full remission, most recent episode mixed ([place])  F31.78     2. Insomnia due to medical condition  G47.01    Mood, pain      Past Psychiatric History: I have reviewed past psychiatric history from progress note on 05/02/2018.  Past trials of Abilify, Seroquel.  Past Medical History:  Past Medical History:  Diagnosis Date   Bipolar disorder ([place])    takes Abilify   Depression    Dysrhythmia 10/2016   LBBB on ekg...nothing to compare it to.   Heart murmur    Hypertension    Nephrolithiasis 2000   s/p lithotripsy   Psychotic disorder ([place])    mania    Past Surgical History:  Procedure Laterality Date   EXTRACORPOREAL SHOCK WAVE LITHOTRIPSY Left 01/06/2017   Procedure: EXTRACORPOREAL SHOCK WAVE LITHOTRIPSY (ESWL);  Surgeon: Hollice Espy, MD;  Location: [facility] ORS;  Service: Urology;  Laterality: Left;   LITHOTRIPSY  2000   Family Psychiatric History: I have reviewed family psychiatric history from progress note on 05/02/2018.  Family History:  Family History  Problem Relation Age of Onset   Hyperlipidemia Mother    Stroke Mother    Hypertension Mother    Hyperlipidemia  Father    Hypertension Father    Cancer Sister        breast   Breast cancer Sister 62   Heart disease Brother        myocardial infarction   Heart attack Brother    Hypertension Brother    Fibromyalgia Sister    Hypertension Sister    Heart Problems Sister    Hypertension Sister    Prostate cancer Neg Hx    Kidney cancer Neg Hx    Bladder Cancer Neg Hx     Social History: I have reviewed social history from progress note on 05/02/2018. Social History   Socioeconomic History   Marital status:  Married    Spouse name: richard   Number of children: 3   Years of education: Not on file   Highest education level: Some college, no degree  Occupational History    Comment: retired  Tobacco Use   Smoking status: Never   Smokeless tobacco: Never  Vaping Use   Vaping Use: Never used  Substance and Sexual Activity   Alcohol use: No    Alcohol/week: 0.0 - 2.0 standard drinks of alcohol    Comment: 2-3 times weekly   Drug use: No   Sexual activity: Yes    Birth control/protection: Post-menopausal, None  Other Topics Concern   Not on file  Social History Narrative   Patient lives at home with her husband of 57 years. They have 3 adult children (2 boys, 1 girl). They recently moved back to [place] in January. 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   Social Determinants of Health   Financial Resource Strain: Low Risk  (04/30/2021)   Overall Financial Resource Strain (CARDIA)    Difficulty of Paying Living Expenses: Not hard at all  Food Insecurity: No Food Insecurity (04/30/2021)   Hunger Vital Sign    Worried About Running Out of Food in the Last Year: Never true    Ran Out of Food in the Last Year: Never true  Transportation Needs: No Transportation Needs (04/30/2021)   PRAPARE - Hydrologist (Medical): No    Lack of Transportation (Non-Medical): No  Physical Activity: Sufficiently Active (04/30/2021)   Exercise Vital Sign    Days of Exercise per Week: 4 days    Minutes of Exercise per Session: 120 min  Stress: No Stress Concern Present (04/30/2021)   [place]    Feeling of Stress : Not at all  Social Connections: [place] (04/30/2021)   Social Connection and Isolation Panel [NHANES]    Frequency of Communication with Friends and Family: More than three times a week    Frequency of Social Gatherings with  Friends and Family: More than three times a week    Attends Religious Services: More than 4 times per year    Active Member of [employer] or Organizations: Yes    Attends Music therapist: More than 4 times per year    Marital Status: Married    Allergies:  Allergies  Allergen Reactions   Penicillins Swelling    As a child    Metabolic Disorder Labs: Lab Results  Component Value Date   HGBA1C 5.0 12/01/2021   MPG 94 01/20/2018   Lab Results  Component Value Date   PROLACTIN 3.2 (L) 01/20/2018   Lab Results  Component Value  Date   CHOL 203 (H) 12/01/2021   TRIG 83.0 12/01/2021   HDL 48.80 12/01/2021   CHOLHDL 4 12/01/2021   VLDL 16.6 12/01/2021   LDLCALC 138 (H) 12/01/2021   LDLCALC 154 (H) 04/08/2021   Lab Results  Component Value Date   TSH 0.89 12/01/2021   TSH 0.95 04/08/2021    Therapeutic Level Labs: No results found for: "LITHIUM" No results found for: "VALPROATE" Lab Results  Component Value Date   CBMZ 2.9 (L) 01/07/2014    Current Medications: Current Outpatient Medications  Medication Sig Dispense Refill   amLODipine (NORVASC) 2.5 MG tablet Take 1 tablet (2.5 mg total) by mouth at bedtime. 90 tablet 1   atorvastatin (LIPITOR) 10 MG tablet Take 1 tablet (10 mg total) by mouth daily. 90 tablet 0   irbesartan (AVAPRO) 300 MG tablet Take 1 tablet (300 mg total) by mouth daily. 90 tablet 3   lamoTRIgine (LAMICTAL) 25 MG tablet Take 1 tablet (25 mg total) by mouth 2 (two) times daily. 180 tablet 1   melatonin 5 MG TABS Take 5 mg by mouth.     metoprolol succinate (TOPROL-XL) 50 MG 24 hr tablet Take 1 tablet (50 mg total) by mouth daily. Take with or immediately following a meal. 90 tablet 3   omeprazole (PRILOSEC) 20 MG capsule Take 1 capsule (20 mg total) by mouth daily. 30 capsule 3   No current facility-administered medications for this visit.     Musculoskeletal: Strength & Muscle Tone:  UTA Gait & Station:  Seated Patient leans:  N/A  Psychiatric Specialty Exam: Review of Systems  Musculoskeletal:        Lt.sided Big toe - pain , may need surgery  Psychiatric/Behavioral:  Positive for sleep disturbance.   All other systems reviewed and are negative.   There were no vitals taken for this visit.There is no height or weight on file to calculate BMI.  General Appearance: Casual  Eye Contact:  Fair  Speech:  Clear and Coherent  Volume:  Normal  Mood:  Euthymic  Affect:  Congruent  Thought Process:  Goal Directed and Descriptions of Associations: Intact  Orientation:  Full (Time, Place, and Person)  Thought Content: Logical   Suicidal Thoughts:  No  Homicidal Thoughts:  No  Memory:  Immediate;   Fair Recent;   Fair Remote;   Fair  Judgement:  Fair  Insight:  Fair  Psychomotor Activity:  Normal  Concentration:  Concentration: Fair and Attention Span: Fair  Recall:  [employer] of Knowledge: Fair  Language: Fair  Akathisia:  No  Handed:  Right  AIMS (if indicated): not done  Assets:  Communication Skills Desire for Improvement Housing Social Support  ADL's:  Intact  Cognition: WNL  Sleep:   interrupted   Screenings: Administrator, Civil Service Office Visit from 06/17/2022 in [place] Office Visit from 03/27/2021 in [place] Total Score 0 1      [place] Office Visit from 12/01/2022 in [place] Office Visit from 10/26/2022 in [place] Office Visit from 06/17/2022 in [place]  Total GAD-7 Score 4 13 2       PHQ2-9    [place] Office Visit from 01/31/2023 in [place] at [facility] Visit from 12/01/2022 in [place] Office Visit  from 10/26/2022 in [place] Office Visit from 06/17/2022 in [place] Office Visit from [facility]/18/2022 in [facility]  PHQ-2 Total Score 0 0 1 0 0  PHQ-9 Total Score -- 2 11 -- --      Flowsheet Row Video Visit from 02/21/2023 in [place] Office Visit from 12/01/2022 in [place] Office Visit from 10/26/2022 in [place] No Risk No Risk No Risk        Assessment and Plan: Samadhi Goding is a 72 year old Caucasian female who has a history of bipolar disorder, currently doing well with regards to mood although does have sleep issues.  Plan as noted below.  Plan Bipolar disorder type I mixed mild in remission Lamotrigine 25 mg p.o. twice daily   Insomnia-unstable Discussed sleep hygiene techniques Discontinue trazodone for noncompliance and side effects. Melatonin 5 mg p.o. nightly as needed  Follow-up in clinic in 3 months or sooner if needed.  Consent: Patient/Guardian gives verbal consent for treatment and assignment of benefits for services provided during this visit. Patient/Guardian expressed understanding and agreed to proceed.   This note was generated in part or whole with voice recognition software. Voice recognition is usually quite accurate but there are transcription errors that can and very often do occur. I apologize for any typographical errors that were not detected and corrected.      Ursula Alert, MD 02/22/2023, 7:42 AM

## 2023-02-22 DIAGNOSIS — F3178 Bipolar disorder, in full remission, most recent episode mixed: Secondary | ICD-10-CM | POA: Insufficient documentation

## 2023-03-01 ENCOUNTER — Ambulatory Visit (INDEPENDENT_AMBULATORY_CARE_PROVIDER_SITE_OTHER): Payer: Medicare HMO | Admitting: *Deleted

## 2023-03-01 ENCOUNTER — Encounter: Payer: Self-pay | Admitting: Internal Medicine

## 2023-03-01 ENCOUNTER — Ambulatory Visit (INDEPENDENT_AMBULATORY_CARE_PROVIDER_SITE_OTHER): Payer: Medicare HMO | Admitting: Internal Medicine

## 2023-03-01 ENCOUNTER — Encounter: Payer: Self-pay | Admitting: *Deleted

## 2023-03-01 VITALS — BP 120/72 | HR 85 | Temp 98.2°F | Ht 60.0 in | Wt 172.4 lb

## 2023-03-01 VITALS — BP 120/72 | Ht 60.0 in | Wt 172.4 lb

## 2023-03-01 DIAGNOSIS — E538 Deficiency of other specified B group vitamins: Secondary | ICD-10-CM | POA: Diagnosis not present

## 2023-03-01 DIAGNOSIS — I10 Essential (primary) hypertension: Secondary | ICD-10-CM

## 2023-03-01 DIAGNOSIS — G8929 Other chronic pain: Secondary | ICD-10-CM | POA: Diagnosis not present

## 2023-03-01 DIAGNOSIS — Z0000 Encounter for general adult medical examination without abnormal findings: Secondary | ICD-10-CM

## 2023-03-01 DIAGNOSIS — M2550 Pain in unspecified joint: Secondary | ICD-10-CM | POA: Diagnosis not present

## 2023-03-01 DIAGNOSIS — E785 Hyperlipidemia, unspecified: Secondary | ICD-10-CM

## 2023-03-01 DIAGNOSIS — E559 Vitamin D deficiency, unspecified: Secondary | ICD-10-CM

## 2023-03-01 DIAGNOSIS — M25561 Pain in right knee: Secondary | ICD-10-CM | POA: Diagnosis not present

## 2023-03-01 DIAGNOSIS — M25562 Pain in left knee: Secondary | ICD-10-CM | POA: Diagnosis not present

## 2023-03-01 DIAGNOSIS — Z139 Encounter for screening, unspecified: Secondary | ICD-10-CM | POA: Diagnosis not present

## 2023-03-01 DIAGNOSIS — G629 Polyneuropathy, unspecified: Secondary | ICD-10-CM | POA: Diagnosis not present

## 2023-03-01 DIAGNOSIS — Z5941 Food insecurity: Secondary | ICD-10-CM

## 2023-03-01 DIAGNOSIS — K219 Gastro-esophageal reflux disease without esophagitis: Secondary | ICD-10-CM

## 2023-03-01 LAB — COMPREHENSIVE METABOLIC PANEL
ALT: 17 U/L (ref 0–35)
AST: 18 U/L (ref 0–37)
Albumin: 4.3 g/dL (ref 3.5–5.2)
Alkaline Phosphatase: 71 U/L (ref 39–117)
BUN: 17 mg/dL (ref 6–23)
CO2: 29 mEq/L (ref 19–32)
Calcium: 9.8 mg/dL (ref 8.4–10.5)
Chloride: 104 mEq/L (ref 96–112)
Creatinine, Ser: 0.81 mg/dL (ref 0.40–1.20)
GFR: 73.14 mL/min (ref >60.00)
Glucose, Bld: 93 mg/dL (ref 70–99)
Potassium: 4.1 mEq/L (ref 3.5–5.1)
Sodium: 138 mEq/L (ref 135–145)
Total Bilirubin: 1.2 mg/dL (ref 0.2–1.2)
Total Protein: 7 g/dL (ref 6.0–8.3)

## 2023-03-01 LAB — B12 AND FOLATE PANEL
Folate: >23.8 ng/mL (ref >5.9)
Vitamin B-12: 375 pg/mL (ref 211–911)

## 2023-03-01 LAB — MICROALBUMIN / CREATININE URINE RATIO
Creatinine,U: 31.2 mg/dL
Microalb Creat Ratio: 2.2 mg/g (ref 0.0–30.0)
Microalb, Ur: <0.7 mg/dL (ref 0.0–1.9)

## 2023-03-01 LAB — HEMOGLOBIN A1C: Hgb A1c MFr Bld: 5.2 % (ref 4.6–6.5)

## 2023-03-01 LAB — VITAMIN D 25 HYDROXY (VIT D DEFICIENCY, FRACTURES): VITD: 25.48 ng/mL — ABNORMAL LOW (ref 30.00–100.00)

## 2023-03-01 MED ORDER — GABAPENTIN 100 MG PO CAPS
100.0000 mg | ORAL_CAPSULE | Freq: Three times a day (TID) | ORAL | 3 refills | Status: DC
  Filled 2023-06-22 – 2023-07-18 (×4): qty 90, 30d supply, fill #0
  Filled 2023-09-03: qty 90, 30d supply, fill #1
  Filled 2023-09-19: qty 90, 30d supply, fill #0

## 2023-03-01 MED ORDER — OMEPRAZOLE 20 MG PO CPDR: 20.0000 mg | DELAYED_RELEASE_CAPSULE | Freq: Every day | ORAL | 3 refills | Status: DC

## 2023-03-01 NOTE — Patient Instructions (Addendum)
You are due for your Medicare Annual Wellness visit, please schedule this appointment at checkout.      I am prescribing gabapentin to  see if it helps your leg pain.  You can start with 100 mg  three times daily ,  or just in the evening and at bedtime  a  If you tolerate this medication and find that it helps your pain , we can increase the dose gradually as needed , up to 2400 mg daily    The most common side effects are fluid retention and dizziness.   Referral to neurology is in process

## 2023-03-01 NOTE — Assessment & Plan Note (Signed)
Improved on 3 drug regimen. Home readings reviewed

## 2023-03-01 NOTE — Progress Notes (Addendum)
I connected with  Julia Cooke on 03/02/23 by a audio enabled telemedicine application and verified that I am speaking with the correct person using two identifiers.  Patient Location: Home  Provider Location: Office/Clinic  I discussed the limitations of evaluation and management by telemedicine. The patient expressed understanding and agreed to proceed.   Subjective:   Julia Cooke is a 72 y.o. female who presents for Medicare Annual (Subsequent) preventive examination.   Cardiac Risk Factors include: none     Objective:    Today's Vitals   03/01/23 1433 03/01/23 1436  BP: 120/72   Weight: 172 lb 6.4 oz (78.2 kg)   Height: 5' (1.524 m)   PainSc:  8    Body mass index is 33.67 kg/m.     03/01/2023    2:52 PM 03/20/2022    4:02 PM 04/30/2021   11:02 AM 01/05/2021    7:54 AM 07/19/2017    1:31 PM 04/04/2017    1:12 PM 01/04/2017   10:38 AM  Advanced Directives  Does Patient Have a Medical Advance Directive? No No No No     Would patient like information on creating a medical advance directive? Yes (ED - Information included in AVS)  Yes (MAU/Ambulatory/Procedural Areas - Information given) No - Patient declined        Information is confidential and restricted. Go to Review Flowsheets to unlock data.    Current Medications (verified) Outpatient Encounter Medications as of 03/01/2023  Medication Sig   amLODipine (NORVASC) 2.5 MG tablet Take 1 tablet (2.5 mg total) by mouth at bedtime.   atorvastatin (LIPITOR) 10 MG tablet Take 1 tablet (10 mg total) by mouth daily.   gabapentin (NEURONTIN) 100 MG capsule Take 1 capsule (100 mg total) by mouth 3 (three) times daily.   irbesartan (AVAPRO) 300 MG tablet Take 1 tablet (300 mg total) by mouth daily.   lamoTRIgine (LAMICTAL) 25 MG tablet Take 1 tablet (25 mg total) by mouth 2 (two) times daily. (Patient taking differently: Take 25 mg by mouth daily.)   melatonin 5 MG TABS Take 5 mg by mouth.   metoprolol succinate (TOPROL-XL) 50  MG 24 hr tablet Take 1 tablet (50 mg total) by mouth daily. Take with or immediately following a meal.   omeprazole (PRILOSEC) 20 MG capsule Take 1 capsule (20 mg total) by mouth daily.   No facility-administered encounter medications on file as of 03/01/2023.    Allergies (verified) Penicillins   History: Past Medical History:  Diagnosis Date   Bipolar disorder    takes Abilify   Depression    Dysrhythmia 10/2016   LBBB on ekg...nothing to compare it to.   Heart murmur    Hypertension    Nephrolithiasis 2000   s/p lithotripsy   Psychotic disorder    mania   Past Surgical History:  Procedure Laterality Date   EXTRACORPOREAL SHOCK WAVE LITHOTRIPSY Left 01/06/2017   Procedure: EXTRACORPOREAL SHOCK WAVE LITHOTRIPSY (ESWL);  Surgeon: Hollice Espy, MD;  Location: [facility] ORS;  Service: Urology;  Laterality: Left;   LITHOTRIPSY  2000   Family History  Problem Relation Age of Onset   Hyperlipidemia Mother    Stroke Mother    Hypertension Mother    Hyperlipidemia Father    Hypertension Father    Cancer Sister        breast   Breast cancer Sister 67   Heart disease Brother        myocardial infarction   Heart attack  Brother    Hypertension Brother    Fibromyalgia Sister    Hypertension Sister    Heart Problems Sister    Hypertension Sister    Prostate cancer Neg Hx    Kidney cancer Neg Hx    Bladder Cancer Neg Hx    Social History   Socioeconomic History   Marital status: Married    Spouse name: richard   Number of children: 3   Years of education: Not on file   Highest education level: Some college, no degree  Occupational History    Comment: retired  Tobacco Use   Smoking status: Never   Smokeless tobacco: Never  Vaping Use   Vaping Use: Never used  Substance and Sexual Activity   Alcohol use: No    Alcohol/week: 0.0 - 2.0 standard drinks of alcohol    Comment: 2-3 times weekly   Drug use: No   Sexual activity: Yes    Birth control/protection:  Post-menopausal, None  Other Topics Concern   Not on file  Social History Narrative   Patient lives at home with her husband of 52 years. They have 3 adult children (2 boys, 1 girl). They recently moved back to [place] in January. 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   Social Determinants of Health   Financial Resource Strain: High Risk (03/01/2023)   Overall Financial Resource Strain (CARDIA)    Difficulty of Paying Living Expenses: Hard  Food Insecurity: Food Insecurity Present (03/01/2023)   Hunger Vital Sign    Worried About Running Out of Food in the Last Year: Sometimes true    Ran Out of Food in the Last Year: Never true  Transportation Needs: No Transportation Needs (03/01/2023)   PRAPARE - Hydrologist (Medical): No    Lack of Transportation (Non-Medical): No  Physical Activity: Inactive (03/01/2023)   Exercise Vital Sign    Days of Exercise per Week: 0 days    Minutes of Exercise per Session: 0 min  Stress: No Stress Concern Present (03/01/2023)   [place]    Feeling of Stress : Not at all  Social Connections: [place] (03/01/2023)   Social Connection and Isolation Panel [NHANES]    Frequency of Communication with Friends and Family: More than three times a week    Frequency of Social Gatherings with Friends and Family: Twice a week    Attends Religious Services: More than 4 times per year    Active Member of [employer] or Organizations: Yes    Attends Music therapist: More than 4 times per year    Marital Status: Married    Tobacco Counseling Counseling given: Not Answered   Clinical Intake:  Pre-visit preparation completed: Yes  Pain : 0-10 Pain Score: 8  Pain Type: Neuropathic pain Pain Location: Foot Pain Orientation: Left, Right Pain Descriptors / Indicators: Aching Pain  Onset: More than a month ago Pain Frequency: Several days a week (Has referral placed to evaluate) Pain Relieving Factors:  (scheduled for surgery on left toe soon)  Pain Relieving Factors:  (scheduled for surgery on left toe soon)  BMI - recorded: 33.67 Nutritional Status: BMI > 30  Obese Nutritional Risks: None Diabetes: No  How often do you need to have someone help you when you read instructions, pamphlets, or other written materials from your doctor or pharmacy?: 1 -  Never  Diabetic? No  Interpreter Needed?: No      Activities of Daily Living    03/01/2023    2:47 PM  In your present state of health, do you have any difficulty performing the following activities:  Hearing? 0  Vision? 1  Comment Has monovision, wears glasses  Difficulty concentrating or making decisions? 0  Walking or climbing stairs? 1  Dressing or bathing? 0  Doing errands, shopping? 0  Preparing Food and eating ? N  Using the Toilet? N  In the past six months, have you accidently leaked urine? N  Do you have problems with loss of bowel control? N  Managing your Medications? N  Managing your Finances? N  Housekeeping or managing your Housekeeping? N    Patient Care Team: Crecencio Mc, MD as PCP - General (Internal Medicine) Wellington Hampshire, MD as PCP - Cardiology (Cardiology) Nori Riis, PA-C as Physician Assistant (Urology)  Indicate any recent Medical Services you may have received from other than [facility] providers in the past year (date may be approximate).     Assessment:   This is a routine wellness examination for Avenley Rajput.  Hearing/Vision screen No results found.  Dietary issues and exercise activities discussed: Current Exercise Habits: The patient does not participate in regular exercise at present, Exercise limited by: Other - see comments (foot issues)   Goals Addressed   None    Depression Screen    03/01/2023    2:39 PM 03/01/2023   10:17 AM 01/31/2023    2:22  PM 12/01/2022   11:52 AM 10/27/2022   12:14 PM 06/17/2022    1:11 PM 01/18/2022    2:07 PM  PHQ 2/9 Scores  PHQ - 2 Score 0 0 0      PHQ- 9 Score            Information is confidential and restricted. Go to Review Flowsheets to unlock data.    Fall Risk    03/01/2023    2:36 PM 03/01/2023    2:35 PM 03/01/2023   10:17 AM 01/31/2023    2:22 PM 11/26/2021    1:35 PM  Fall Risk   Falls in the past year? 0 0 0 0 0  Number falls in past yr:  0 0 0   Injury with Fall?  0 0 0   Risk for fall due to :   No Fall Risks No Fall Risks No Fall Risks  Follow up   Falls evaluation completed Falls evaluation completed Falls evaluation completed    [place]:  Any stairs in or around the home? Yes  If so, are there any without handrails? No  Home free of loose throw rugs in walkways, pet beds, electrical cords, etc? No  Adequate lighting in your home to reduce risk of falls? Yes   ASSISTIVE DEVICES UTILIZED TO PREVENT FALLS:  Life alert? No  Use of a cane, walker or w/c? No  Grab bars in the bathroom? Yes  Shower chair or bench in shower? Yes  Elevated toilet seat or a handicapped toilet? Yes    Cognitive Function:        03/01/2023    2:50 PM 04/30/2021   11:06 AM  6CIT Screen  What Year? 0 points 0 points  What month? 0 points 0 points  What time? 0 points 0 points  Count back from 20 4 points 0 points  Months in reverse 0 points  0 points  Repeat phrase 4 points   Total Score 8 points     Immunizations Immunization History  Administered Date(s) Administered   Fluad Quad(high Dose 65+) 08/17/2019, 11/10/2021   Influenza Inj Mdck Quad Pf 10/26/2022   Influenza, High Dose Seasonal PF 12/23/2016, 09/28/2017, 10/02/2018   Influenza,inj,Quad PF,6+ Mos 12/24/2015   PFIZER Comirnaty(Gray Top)Covid-19 Tri-Sucrose Vaccine 02/28/2020, 03/20/2020   PFIZER(Purple Top)SARS-COV-2 Vaccination 02/28/2020, 03/20/2020   Pneumococcal Conjugate-13 03/31/2018    Pneumococcal Polysaccharide-23 08/17/2019    TDAP status: Due, Education has been provided regarding the importance of this vaccine. Advised may receive this vaccine at local pharmacy or Health Dept. Aware to provide a copy of the vaccination record if obtained from local pharmacy or Health Dept. Verbalized acceptance and understanding.  Flu Vaccine status: Up to date  Pneumococcal vaccine status: Up to date  Covid-19 vaccine status: Information provided on how to obtain vaccines.   Qualifies for Shingles Vaccine? No   Zostavax completed No   Shingrix Completed?: No.    Education has been provided regarding the importance of this vaccine. Patient has been advised to call insurance company to determine out of pocket expense if they have not yet received this vaccine. Advised may also receive vaccine at local pharmacy or Health Dept. Verbalized acceptance and understanding.  Screening Tests Health Maintenance  Topic Date Due   DTaP/Tdap/Td (1 - Tdap) Never done   MAMMOGRAM  09/13/2019   COVID-19 Vaccine (5 - 2023-24 season) 07/30/2022   Zoster Vaccines- Shingrix (1 of 2) 05/03/2023 (Originally 12/12/1969)   INFLUENZA VACCINE  06/30/2023   Medicare Annual Wellness (AWV)  02/29/2024   Fecal DNA (Cologuard)  06/16/2025   Pneumonia Vaccine 25+ Years old  Completed   DEXA SCAN  Completed   Hepatitis C Screening  Completed   HPV VACCINES  Aged Out    Health Maintenance  Health Maintenance Due  Topic Date Due   DTaP/Tdap/Td (1 - Tdap) Never done   MAMMOGRAM  09/13/2019   COVID-19 Vaccine (5 - 2023-24 season) 07/30/2022    Colorectal cancer screening: Type of screening: Cologuard. Completed 06/16/2022. Repeat every 3 years  Mammogram status: Ordered 03/10/2023. Pt provided with contact info and advised to call to schedule appt.   Bone Density status: Completed 02/22/2017. Results reflect: Bone density results: OSTEOPENIA. Repeat every 3 years.  Lung Cancer Screening: (Low Dose CT  Chest recommended if Age 56-80 years, 30 pack-year currently smoking OR have quit w/in 15years.) does not qualify.    Additional Screening:  Hepatitis C Screening: does qualify; Completed 12/25/2015  Vision Screening: Recommended annual ophthalmology exams for early detection of glaucoma and other disorders of the eye. Is the patient up to date with their annual eye exam?  No  Who is the provider or what is the name of the office in which the patient attends annual eye exams? [facility] If pt is not established with a provider, would they like to be referred to a provider to establish care? No .   Dental Screening: Recommended annual dental exams for proper oral hygiene  Community Resource Referral / Chronic Care Management: CRR required this visit?  Yes   CCM required this visit?  No      Plan:     I have personally reviewed and noted the following in the patient's chart:   Medical and social history Use of alcohol, tobacco or illicit drugs  Current medications and supplements including opioid prescriptions. Patient is not currently taking opioid prescriptions.  Functional ability and status Nutritional status Physical activity Advanced directives List of other physicians Hospitalizations, surgeries, and ER visits in previous 12 months Vitals Screenings to include cognitive, depression, and falls Referrals and appointments  In addition, I have reviewed and discussed with patient certain preventive protocols, quality metrics, and best practice recommendations. A written personalized care plan for preventive services as well as general preventive health recommendations were provided to patient.     Thurmond Butts, CMA   03/02/2023   Nurse Notes: Total time spent on telephone visit today was 20 minutes.  I have given Mrs Cusick resources they she can call for assistance with food, utilities assistance, etc via mychart.      I have reviewed the above information and agree  with above.   Duncan Dull, MD

## 2023-03-01 NOTE — Assessment & Plan Note (Addendum)
Autoimmune workup has been  negative.   Lab Results  Component Value Date   ESRSEDRATE 23 01/31/2023   Lab Results  Component Value Date   CRP <1.0 01/31/2023   Lab Results  Component Value Date   ANA NEGATIVE 01/31/2023

## 2023-03-01 NOTE — Patient Instructions (Signed)
  Julia Cooke , Thank you for taking time to come for your Medicare Wellness Visit. I appreciate your ongoing commitment to your health goals. Please review the following plan we discussed and let me know if I can assist you in the future.   These are the goals we discussed:  Goals       Weight (lb) < 174 lb (78.9 kg) (pt-stated)      I would like to lose 20-25lbs Portion control Increase physical activity as tolerated        This is a list of the screening recommended for you and due dates:  Health Maintenance  Topic Date Due   DTaP/Tdap/Td vaccine (1 - Tdap) Never done   Mammogram  09/13/2019   COVID-19 Vaccine (5 - 2023-24 season) 07/30/2022   Zoster (Shingles) Vaccine (1 of 2) 05/03/2023*   Flu Shot  06/30/2023   Medicare Annual Wellness Visit  02/29/2024   Cologuard (Stool DNA test)  06/16/2025   Pneumonia Vaccine  Completed   DEXA scan (bone density measurement)  Completed   Hepatitis C Screening: USPSTF Recommendation to screen - Ages 1-79 yo.  Completed   HPV Vaccine  Aged Out  *Topic was postponed. The date shown is not the original due date.

## 2023-03-01 NOTE — Progress Notes (Signed)
Subjective:  Patient ID: Julia Cooke, female    DOB: [birth year]  Age: 72 y.o. MRN: [id number]  CC: The primary encounter diagnosis was Vitamin D deficiency. Diagnoses of Hyperlipidemia LDL goal <100, Essential hypertension, Posterior left knee pain, Chronic GERD, Chronic pain of both knees, B12 deficiency, Peripheral polyneuropathy, and Polyarthralgia were also pertinent to this visit.   HPI Kennya Ojo presents for follow up on hypertension and chronic pain management Chief Complaint  Patient presents with   Medical Management of Chronic Issues    1 month follow up     1) HTN:  taking amlodipine 2.5  ,  irbesartan 300 mg ,  metoprolol succinate 50 mg  .  She notes readings < 130/80 with medication ,  140/90 if she forgets to take meds.  Take 2 in  the morning and one at night  2) Chronic pain : at last visit one month ago she requested workup for autoimmune disease , which was negative.  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 (Broke it 4 years ago)  she has subacute aching pain in both legs that  aggravated  by walking   even at the grocery store using a cat.  .  At times the legs feels almost numb  after sitting for 20 minutes. Act  She has no history of low back surgery but has chronic low back pain managed with chiropractic manipulation  [facility].   She has untreated B12 deficiency due to noncompliance,  and vitamin D deficiency   Outpatient Medications Prior to Visit  Medication Sig Dispense Refill   amLODipine (NORVASC) 2.5 MG tablet Take 1 tablet (2.5 mg total) by mouth at bedtime. 90 tablet 1   atorvastatin (LIPITOR) 10 MG tablet Take 1 tablet (10 mg total) by mouth daily. 90 tablet 0   irbesartan (AVAPRO) 300 MG tablet Take 1 tablet (300 mg total) by mouth daily. 90 tablet 3   lamoTRIgine (LAMICTAL) 25 MG tablet Take 1 tablet (25 mg total) by mouth 2 (two) times daily. (Patient taking differently: Take 25 mg by mouth daily.)  180 tablet 1   melatonin 5 MG TABS Take 5 mg by mouth.     metoprolol succinate (TOPROL-XL) 50 MG 24 hr tablet Take 1 tablet (50 mg total) by mouth daily. Take with or immediately following a meal. 90 tablet 3   omeprazole (PRILOSEC) 20 MG capsule Take 1 capsule (20 mg total) by mouth daily. 30 capsule 3   No facility-administered medications prior to visit.    Review of Systems;  Patient denies headache, fevers, malaise, unintentional weight loss, skin rash, eye pain, sinus congestion and sinus pain, sore throat, dysphagia,  hemoptysis , cough, dyspnea, wheezing, chest pain, palpitations, orthopnea, edema, abdominal pain, nausea, melena, diarrhea, constipation, flank pain, dysuria, hematuria, urinary  Frequency, nocturia, numbness, tingling, seizures,  Focal weakness, Loss of consciousness,  Tremor, insomnia, depression, anxiety, and suicidal ideation.      Objective:  BP 120/72   Pulse 85   Temp 98.2 F (36.8 C) (Oral)   Ht 5' (1.524 m)   Wt 172 lb 6.4 oz (78.2 kg)   SpO2 96%   BMI 33.67 kg/m   BP Readings from Last 3 Encounters:  03/01/23 120/72  01/31/23 122/62  01/18/23 (!) 180/96    Wt Readings from Last 3 Encounters:  03/01/23 172 lb 6.4 oz (78.2 kg)  01/31/23 169 lb 6.4 oz (76.8 kg)  01/18/23  172 lb 3.2 oz (78.1 kg)    Physical Exam  Lab Results  Component Value Date   HGBA1C 5.0 12/01/2021   HGBA1C 4.8 06/11/2020   HGBA1C 4.9 01/20/2018    Lab Results  Component Value Date   CREATININE 0.82 01/31/2023   CREATININE 0.82 12/06/2022   CREATININE 0.79 12/01/2021    Lab Results  Component Value Date   WBC 5.7 12/01/2021   HGB 13.1 12/01/2021   HCT 39.3 12/01/2021   PLT 210.0 12/01/2021   GLUCOSE 93 01/31/2023   CHOL 203 (H) 12/01/2021   TRIG 83.0 12/01/2021   HDL 48.80 12/01/2021   LDLDIRECT 162.0 12/23/2016   LDLCALC 138 (H) 12/01/2021   ALT 16 01/31/2023   AST 17 01/31/2023   NA 138 01/31/2023   K 5.0 01/31/2023   CL 100 01/31/2023    CREATININE 0.82 01/31/2023   BUN 23 01/31/2023   CO2 30 01/31/2023   TSH 0.89 12/01/2021   HGBA1C 5.0 12/01/2021   MICROALBUR 0.7 12/23/2016    ECHOCARDIOGRAM COMPLETE  Result Date: 01/05/2023    ECHOCARDIOGRAM REPORT   Patient Name:   Julia Cooke Date of Exam: 01/04/2023 Medical Rec #:  [id number]      Height:       60.0 in Accession #:    [id number]     Weight:       172.6 lb Date of Birth:  September 24, 1951      BSA:          1.753 m Patient Age:    78 years       BP:           152/98 mmHg Patient Gender: F              HR:           76 bpm. Exam Location:  [facility] Procedure: 2D Echo, 3D Echo, Cardiac Doppler, Color Doppler and Strain Analysis Indications:    R06.02 SOB  History:        Patient has prior history of Echocardiogram examinations, most                 recent 12/08/2016. Arrythmias:LBBB, Signs/Symptoms:Shortness of                 Breath; Risk Factors:Hypertension, Dyslipidemia and Non-Smoker.  Sonographer:    Pilar Jarvis RDMS, RVT, RDCS Referring Phys: [place]  1. Left ventricular ejection fraction, by estimation, is 55 to 60%. The left ventricle has normal function. The left ventricle has no regional wall motion abnormalities. There is moderate eccentric left ventricular hypertrophy of the inferior and infero-lateral segments. Left ventricular diastolic parameters are consistent with Grade I diastolic dysfunction (impaired relaxation). The average left ventricular global longitudinal strain is -19.7 %.  2. Right ventricular systolic function is normal. The right ventricular size is normal. There is mildly elevated pulmonary artery systolic pressure. The estimated right ventricular systolic pressure is 123XX123 mmHg.  3. The mitral valve is normal in structure. Mild to moderate mitral valve regurgitation. No evidence of mitral stenosis.  4. The aortic valve is normal in structure. Aortic valve regurgitation is mild. No aortic stenosis is present.  5. The inferior vena cava  is normal in size with greater than 50% respiratory variability, suggesting right atrial pressure of 3 mmHg. FINDINGS  Left Ventricle: Left ventricular ejection fraction, by estimation, is 55 to 60%. The left ventricle has normal function. The left ventricle has no regional wall motion  abnormalities. The average left ventricular global longitudinal strain is -19.7 %. The left ventricular internal cavity size was normal in size. There is moderate eccentric left ventricular hypertrophy of the inferior and infero-lateral segments. Left ventricular diastolic parameters are consistent with Grade I diastolic dysfunction (impaired relaxation). Right Ventricle: The right ventricular size is normal. No increase in right ventricular wall thickness. Right ventricular systolic function is normal. There is mildly elevated pulmonary artery systolic pressure. The tricuspid regurgitant velocity is 2.84  m/s, and with an assumed right atrial pressure of 5 mmHg, the estimated right ventricular systolic pressure is 123XX123 mmHg. Left Atrium: Left atrial size was normal in size. Right Atrium: Right atrial size was normal in size. Pericardium: There is no evidence of pericardial effusion. Mitral Valve: The mitral valve is normal in structure. Mild to moderate mitral valve regurgitation. No evidence of mitral valve stenosis. Tricuspid Valve: The tricuspid valve is normal in structure. Tricuspid valve regurgitation is mild . No evidence of tricuspid stenosis. Aortic Valve: The aortic valve is normal in structure. Aortic valve regurgitation is mild. Aortic regurgitation PHT measures 455 msec. No aortic stenosis is present. Aortic valve mean gradient measures 5.0 mmHg. Aortic valve peak gradient measures 8.5 mmHg. Aortic valve area, by VTI measures 1.91 cm. Pulmonic Valve: The pulmonic valve was normal in structure. Pulmonic valve regurgitation is mild. No evidence of pulmonic stenosis. Aorta: The aortic root is normal in size and structure.  Venous: The inferior vena cava is normal in size with greater than 50% respiratory variability, suggesting right atrial pressure of 3 mmHg. IAS/Shunts: No atrial level shunt detected by color flow Doppler.  LEFT VENTRICLE PLAX 2D LVIDd:         4.50 cm     Diastology LVIDs:         3.00 cm     LV e' medial:    5.11 cm/s LV PW:         1.00 cm     LV E/e' medial:  18.1 LV IVS:        1.10 cm     LV e' lateral:   9.90 cm/s LVOT diam:     1.80 cm     LV E/e' lateral: 9.3 LV SV:         56 LV SV Index:   32          2D Longitudinal Strain LVOT Area:     2.54 cm    2D Strain GLS Avg:     -19.7 %  LV Volumes (MOD) LV vol d, MOD A2C: 85.4 ml LV vol d, MOD A4C: 89.6 ml LV vol s, MOD A2C: 35.4 ml LV vol s, MOD A4C: 38.5 ml LV SV MOD A2C:     50.0 ml LV SV MOD A4C:     89.6 ml LV SV MOD BP:      51.8 ml RIGHT VENTRICLE             IVC RV Basal diam:  3.20 cm     IVC diam: 1.50 cm RV S prime:     10.60 cm/s TAPSE (M-mode): 2.6 cm      PULMONARY VEINS                             Systolic Velocity: [id number] cm/s LEFT ATRIUM             Index        RIGHT ATRIUM  Index LA diam:        4.00 cm 2.28 cm/m   RA Area:     12.20 cm LA Vol (A2C):   58.9 ml 33.59 ml/m  RA Volume:   25.10 ml  14.32 ml/m LA Vol (A4C):   48.0 ml 27.38 ml/m LA Biplane Vol: 57.7 ml 32.91 ml/m  AORTIC VALVE                     PULMONIC VALVE AV Area (Vmax):    1.76 cm      PV Vmax:       1.67 m/s AV Area (Vmean):   1.66 cm      PV Peak grad:  11.2 mmHg AV Area (VTI):     1.91 cm AV Vmax:           146.00 cm/s AV Vmean:          106.000 cm/s AV VTI:            0.294 m AV Peak Grad:      8.5 mmHg AV Mean Grad:      5.0 mmHg LVOT Vmax:         101.00 cm/s LVOT Vmean:        69.300 cm/s LVOT VTI:          0.221 m LVOT/AV VTI ratio: 0.75 AI PHT:            455 msec  AORTA Ao Root diam: 2.50 cm Ao Asc diam:  2.60 cm Ao Arch diam: 2.3 cm MITRAL VALVE                TRICUSPID VALVE MV Area (PHT): 3.17 cm     TR Peak grad:   32.3 mmHg MV Decel Time: 239  msec     TR Vmax:        284.00 cm/s MV E velocity: 92.50 cm/s MV A velocity: 134.00 cm/s  SHUNTS MV E/A ratio:  0.69         Systemic VTI:  0.22 m                             Systemic Diam: 1.80 cm Ida Rogue MD Electronically signed by Ida Rogue MD Signature Date/Time: 01/05/2023/12:49:19 PM    Final    CT CORONARY MORPH W/CTA COR W/SCORE Lewanda Rife W/CM &/OR WO/CM  Addendum Date: 01/04/2023   ADDENDUM REPORT: 01/04/2023 00:04 EXAM: OVER-READ INTERPRETATION  CT CHEST The following report is an over-read performed by radiologist Dr. Collene Leyden [facility] on 01/04/2023. This over-read does not include interpretation of cardiac or coronary anatomy or pathology. The coronary CTA interpretation by the cardiologist is attached. COMPARISON:  None. FINDINGS: Heart is normal size. Aorta normal caliber. No adenopathy. No confluent opacities or effusions. No acute findings in the upper abdomen. Chest wall soft tissues are unremarkable. No acute bony abnormality. IMPRESSION: No acute or significant extracardiac abnormality. Electronically Signed   By: Rolm Baptise M.D.   On: 01/04/2023 00:04   Result Date: 01/04/2023 CLINICAL DATA:  Chest pain, shortness of breath EXAM: Cardiac/Coronary  CTA TECHNIQUE: The patient was scanned on a Siemens Somatom go.Top scanner. : A retrospective scan was triggered in the descending thoracic aorta. Axial non-contrast 3 mm slices were carried out through the heart. The data set was analyzed on a dedicated work station and scored using the [place]. Gantry rotation speed was 330 msecs  and collimation was .6 mm. 100mg  of metoprolol and 0.8 mg of sl NTG was given. The 3D data set was reconstructed in 5% intervals of the 60-95 % of the R-R cycle. Diastolic phases were analyzed on a dedicated work station using MPR, MIP and VRT modes. The patient received 100 cc of contrast. FINDINGS: Aorta: Normal size. Mild aortic root calcifications. No dissection. Aortic Valve:   Trileaflet.  No calcifications. Coronary Arteries:  Normal coronary origin.  Right dominance. RCA is a dominant artery that gives rise to PDA and PLA. There is no plaque. Left main gives rise to LAD and LCX arteries.  LM has no disease. LAD has no plaque. LCX is a non-dominant artery.  There is no plaque. Other findings: Normal pulmonary vein drainage into the left atrium. Normal left atrial appendage without a thrombus. Normal size of the pulmonary artery. IMPRESSION: 1. Normal coronary calcium score of 0.  Patient is low risk. 2. Normal coronary origin with right dominance. 3. No evidence of CAD. 4. CAD-RADS 0. Consider non-atherosclerotic causes of chest pain. Electronically Signed: By: Kate Sable M.D. On: 01/03/2023 12:38    Assessment & Plan:  .Vitamin D deficiency -     VITAMIN D 25 Hydroxy (Vit-D Deficiency, Fractures)  Hyperlipidemia LDL goal <100 -     Omeprazole; Take 1 capsule (20 mg total) by mouth daily.  Dispense: 30 capsule; Refill: 3  Essential hypertension Assessment & Plan: Improved on 3 drug regimen. Home readings reviewed   Orders: -     Omeprazole; Take 1 capsule (20 mg total) by mouth daily.  Dispense: 30 capsule; Refill: 3 -     Microalbumin / creatinine urine ratio -     Comprehensive metabolic panel  Posterior left knee pain -     Omeprazole; Take 1 capsule (20 mg total) by mouth daily.  Dispense: 30 capsule; Refill: 3  Chronic GERD -     Omeprazole; Take 1 capsule (20 mg total) by mouth daily.  Dispense: 30 capsule; Refill: 3  Chronic pain of both knees -     Omeprazole; Take 1 capsule (20 mg total) by mouth daily.  Dispense: 30 capsule; Refill: 3  B12 deficiency -     B12 and Folate Panel -     Methylmalonic acid, serum -     Intrinsic Factor Antibodies  Peripheral polyneuropathy Assessment & Plan: Suspected by description of pain /numbness,  in the setting of  B 12 deficiency,  vit d def science and chronic low back pain .  Record requested from  [facility] chiropractic  repeat lbs,  and neurology referral for EMG/[place] studies .  Trial of gabapentin   Orders: -     Ambulatory referral to Neurology -     Hemoglobin A1c  Polyarthralgia Assessment & Plan: Autoimmune workup has been  negative.   Lab Results  Component Value Date   ESRSEDRATE 23 01/31/2023   Lab Results  Component Value Date   CRP <1.0 01/31/2023   Lab Results  Component Value Date   ANA NEGATIVE 01/31/2023      Other orders -     Gabapentin; Take 1 capsule (100 mg total) by mouth 3 (three) times daily.  Dispense: 90 capsule; Refill: 3   Follow-up: Return in about 3 months (around 05/31/2023).   Crecencio Mc, MD

## 2023-03-01 NOTE — Assessment & Plan Note (Addendum)
Suspected by description of pain /numbness,  in the setting of  B 12 deficiency,  vit d def science and chronic low back pain .  Record requested from [facility] chiropractic  repeat lbs,  and neurology referral for EMG/[place] studies .  Trial of gabapentin

## 2023-03-02 ENCOUNTER — Encounter: Payer: Self-pay | Admitting: *Deleted

## 2023-03-03 ENCOUNTER — Encounter: Payer: Self-pay | Admitting: Podiatry

## 2023-03-03 NOTE — Anesthesia Preprocedure Evaluation (Addendum)
Anesthesia Evaluation  Patient identified by MRN, date of birth, ID band Patient awake    Reviewed: reviewed documented beta blocker date and time   Airway Mallampati: III  TM Distance: >3 FB Neck ROM: Full    Dental no notable dental hx. (+) Poor Dentition, Chipped Upper central and lateral incisors are uneven, chips noticed:   Pulmonary sleep apnea  Reactive airway/asthma, hasn't used inhaler in "awhile."  Has mild sleep apnea, not on CPAP   Pulmonary exam normal breath sounds clear to auscultation       Cardiovascular hypertension, Pt. on medications and Pt. on home beta blockers Normal cardiovascular exam+ dysrhythmias  Rhythm:Regular Rate:Normal  Echo 02-026-24  EF 55-60%, mild to moderate MR, mild AR, mild PR, moderate eccentric LVH, of inferior and inferolateral segments, mild increased PASP 37.3, grade I diastolic dysfunction Hx "heart murmur" per epic LBBB     Neuro/Psych  PSYCHIATRIC DISORDERS Anxiety Depression Bipolar Disorder   Hx "psychotic disorder" per Epic   GI/Hepatic   Endo/Other    Renal/GU Hx nephrolithiasis     Musculoskeletal  (+) Arthritis , Osteoarthritis,    Abdominal   Peds  Hematology   Anesthesia Other Findings Depression  Psychotic disorder Hypertension  Heart murmur Nephrolithiasis  Bipolar disorder Dysrhythmia  Wears contact lenses    Reproductive/Obstetrics                             Anesthesia Physical Anesthesia Plan  ASA: 2  Anesthesia Plan: General ETT   Post-op Pain Management:    Induction: Intravenous  PONV Risk Score and Plan:   Airway Management Planned: Oral ETT  Additional Equipment:   Intra-op Plan:   Post-operative Plan: Extubation in OR  Informed Consent: I have reviewed the patients History and Physical, chart, labs and discussed the procedure including the risks, benefits and alternatives for the proposed anesthesia  with the patient or authorized representative who has indicated his/her understanding and acceptance.     Dental Advisory Given  Plan Discussed with: Anesthesiologist, CRNA and Surgeon  Anesthesia Plan Comments: (Patient consented for risks of anesthesia including but not limited to:  - adverse reactions to medications - damage to eyes, teeth, lips or other oral mucosa - nerve damage due to positioning  - sore throat or hoarseness - Damage to heart, brain, nerves, lungs, other parts of body or loss of life  Patient voiced understanding.)       Anesthesia Quick Evaluation

## 2023-03-03 NOTE — Progress Notes (Signed)
Cardiology Office Note    Date:  03/04/2023   ID:  Baljit, Liebert 1951/07/06, MRN [id number]  PCP:  Sherlene Shams, MD  Cardiologist:  Lorine Bears, MD  Electrophysiologist:  None   Chief Complaint: Follow-up  History of Present Illness:   Julia Cooke is a 72 y.o. female with history of normal coronary arteries by coronary CTA in 12/2022, HTN, HLD, bipolar disorder, and left bundle branch block who presents for follow up of hypertension.   She was previously evaluated by Dr. Alvino Chapel with echo in 11/2016 showing an EF of 55 to 60%, normal wall motion, grade 1 diastolic dysfunction, and mild to moderate pulmonic regurgitation.  Lexiscan MPI in 11/2016 showed no evidence of significant ischemia and was overall low risk.  She is a non-smoker and does not have history of diabetes.  Her family history is remarkable for hypertension and CVA.   She established with Dr. Kirke Corin in 10/2022 for evaluation of worsening exertional dyspnea with associated chest discomfort described as burning with exertion.  It was also noted her blood pressure has been uncontrolled despite losartan and metoprolol.  Given symptoms, she underwent coronary CTA on 01/03/2023 that showed a calcium score of 0 with no evidence of CAD.  There were no significant extracardiac abnormalities noted.  Echo on 01/04/2023 showed an EF of 55 to 60%, no regional wall motion abnormalities, moderate concentric LVH of the inferior and inferolateral segments, grade 1 diastolic dysfunction, normal RV systolic function and ventricular cavity size, mildly elevated PASP estimated at 37.3 mmHg, mild to moderate mitral regurgitation, mild aortic insufficiency, and an estimated right atrial pressure of 3 mmHg.  She followed up with Dr. Kirke Corin on 01/07/2023 and noted improvement in chest pain with stable shortness of breath.  There was concern that some of her symptoms were related to hypertensive heart disease with diastolic dysfunction and mild pulmonary  hypertension.  Given persistently elevated BP, Toprol was transitioned to carvedilol 6.25 mg twice daily with continuation of losartan 100 mg.  With this, she contacted our office through MyChart noting continued elevations in her BP in the 150s over 90s as well as polyarthralgias and some chest heaviness.  She was concerned she was having off target effects of with carvedilol.    She was last seen in the office in 12/2022 noting elevated BP readings up into the 180s systolic.  She was without symptoms of angina or cardiac decompensation.  She felt like her baseline knee arthralgias and myalgias were exacerbated by the addition of carvedilol.  She noted an increase in headaches.  She was transitioned from carvedilol back to Toprol-XL at 50 mg daily with transition from losartan to irbesartan 300 mg daily.  She comes in doing well from a cardiac perspective and is without symptoms of angina or cardiac decompensation.  Her blood pressure has been well-controlled since making the above changes.  She is tolerating irbesartan and metoprolol without issues.  No dizziness, presyncope, or syncope.  No significant dyspnea.  No palpitations.  She does note some bilateral lower extremity fatigue and cramping if she is standing on her legs for a prolonged timeframe.  She has been referred to neurology for this.  No nonhealing wounds of the lower extremities.  She is scheduled to undergo foot surgery next week.  Duke Activity Status Index: > 4 METs without cardiac limitation  Revised Cardiac Risk Index: Low risk for noncardiac surgery with an estimated rate of 0.9% for adverse cardiac event in  the perioperative timeframe   Labs independently reviewed: 02/2023 - potassium 4.1, BUN 17, serum creatinine 0.81, albumin 4.3, AST/ALT normal, A1c 5.2 11/2022 - TC 161, TG 111, HDL 56, LDL 82 09/2022 - Hgb 13.9, PLT 231, TSH normal 11/2021 - A1c 5.0  Past Medical History:  Diagnosis Date   Bipolar disorder    takes Abilify    Depression    Dysrhythmia 10/2016   LBBB on ekg...nothing to compare it to.   Heart murmur    Hypertension    Nephrolithiasis 2000   s/p lithotripsy   Psychotic disorder    mania   Wears contact lenses     Past Surgical History:  Procedure Laterality Date   EXTRACORPOREAL SHOCK WAVE LITHOTRIPSY Left 01/06/2017   Procedure: EXTRACORPOREAL SHOCK WAVE LITHOTRIPSY (ESWL);  Surgeon: Vanna Scotland, MD;  Location: [facility] ORS;  Service: Urology;  Laterality: Left;   LITHOTRIPSY  2000    Current Medications: Current Meds  Medication Sig   amLODipine (NORVASC) 2.5 MG tablet Take 1 tablet (2.5 mg total) by mouth at bedtime.   atorvastatin (LIPITOR) 10 MG tablet Take 1 tablet (10 mg total) by mouth daily.   Cholecalciferol (VITAMIN D-3 PO) Take by mouth daily.   Cyanocobalamin (VITAMIN B-12 PO) Take by mouth daily.   gabapentin (NEURONTIN) 100 MG capsule Take 1 capsule (100 mg total) by mouth 3 (three) times daily.   irbesartan (AVAPRO) 300 MG tablet Take 1 tablet (300 mg total) by mouth daily.   lamoTRIgine (LAMICTAL) 25 MG tablet Take 1 tablet (25 mg total) by mouth 2 (two) times daily.   melatonin 5 MG TABS Take 5 mg by mouth.   metoprolol succinate (TOPROL-XL) 50 MG 24 hr tablet Take 1 tablet (50 mg total) by mouth daily. Take with or immediately following a meal.   Multiple Vitamin (MULTIVITAMIN) tablet Take 1 tablet by mouth daily.   omeprazole (PRILOSEC) 20 MG capsule Take 1 capsule (20 mg total) by mouth daily.    Allergies:   Penicillins   Social History   Socioeconomic History   Marital status: Married    Spouse name: richard   Number of children: 3   Years of education: Not on file   Highest education level: Some college, no degree  Occupational History    Comment: retired  Tobacco Use   Smoking status: Never   Smokeless tobacco: Never  Vaping Use   Vaping Use: Never used  Substance and Sexual Activity   Alcohol use: No    Alcohol/week: 0.0 - 2.0 standard drinks  of alcohol    Comment: 2-3 times weekly   Drug use: No   Sexual activity: Yes    Birth control/protection: Post-menopausal, None  Other Topics Concern   Not on file  Social History Narrative   Patient lives at home with her husband of 41 years. They have 3 adult children (2 boys, 1 girl). They recently moved back to [place] in January. 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 Patient has also worked as a Occupational hygienist.   Social Determinants of Health   Financial Resource Strain: High Risk (03/01/2023)   Overall Financial Resource Strain (CARDIA)    Difficulty of Paying Living Expenses: Hard  Food Insecurity: Food Insecurity Present (03/01/2023)   Hunger Vital Sign    Worried About Running Out of Food in the Last Year: Sometimes true    Ran Out of Food in the Last Year: Never true  Transportation Needs:  No Transportation Needs (03/01/2023)   PRAPARE - Administrator, Civil Service (Medical): No    Lack of Transportation (Non-Medical): No  Physical Activity: Inactive (03/01/2023)   Exercise Vital Sign    Days of Exercise per Week: 0 days    Minutes of Exercise per Session: 0 min  Stress: No Stress Concern Present (03/01/2023)   [employer] of Occupational Health - Occupational Stress Questionnaire    Feeling of Stress : Not at all  Social Connections: Socially Integrated (03/01/2023)   Social Connection and Isolation Panel [NHANES]    Frequency of Communication with Friends and Family: More than three times a week    Frequency of Social Gatherings with Friends and Family: Twice a week    Attends Religious Services: More than 4 times per year    Active Member of [employer] or Organizations: Yes    Attends Engineer, structural: More than 4 times per year    Marital Status: Married     Family History:  The patient's family history includes Breast cancer (age of onset: 29) in her sister; Cancer in her sister; Fibromyalgia in her sister; Heart  Problems in her sister; Heart attack in her brother; Heart disease in her brother; Hyperlipidemia in her father and mother; Hypertension in her brother, father, mother, sister, and sister; Stroke in her mother. There is no history of Prostate cancer, Kidney cancer, or Bladder Cancer.  ROS:   12-point review of systems is negative unless otherwise noted in the HPI.   EKGs/Labs/Other Studies Reviewed:    Studies reviewed were summarized above. The additional studies were reviewed today:  2D echo 01/04/2023: 1. Left ventricular ejection fraction, by estimation, is 55 to 60%. The  left ventricle has normal function. The left ventricle has no regional  wall motion abnormalities. There is moderate eccentric left ventricular  hypertrophy of the inferior and  infero-lateral segments. Left ventricular diastolic parameters are  consistent with Grade I diastolic dysfunction (impaired relaxation). The  average left ventricular global longitudinal strain is -19.7 %.   2. Right ventricular systolic function is normal. The right ventricular  size is normal. There is mildly elevated pulmonary artery systolic  pressure. The estimated right ventricular systolic pressure is 37.3 mmHg.   3. The mitral valve is normal in structure. Mild to moderate mitral valve  regurgitation. No evidence of mitral stenosis.   4. The aortic valve is normal in structure. Aortic valve regurgitation is  mild. No aortic stenosis is present.   5. The inferior vena cava is normal in size with greater than 50%  respiratory variability, suggesting right atrial pressure of 3 mmHg.  __________   Coronary CTA 01/03/2023: FINDINGS: Aorta: Normal size. Mild aortic root calcifications. No dissection.   Aortic Valve:  Trileaflet.  No calcifications.   Coronary Arteries:  Normal coronary origin.  Right dominance.   RCA is a dominant artery that gives rise to PDA and PLA. There is no plaque.   Left main gives rise to LAD and LCX  arteries.  LM has no disease.   LAD has no plaque.   LCX is a non-dominant artery.  There is no plaque.   Other findings:   Normal pulmonary vein drainage into the left atrium.   Normal left atrial appendage without a thrombus.   Normal size of the pulmonary artery.   IMPRESSION: 1. Normal coronary calcium score of 0.  Patient is low risk. 2. Normal coronary origin with right dominance. 3. No evidence  of CAD. 4. CAD-RADS 0. Consider non-atherosclerotic causes of chest pain. __________   Eugenie Birks MPI 12/29/2016: Pharmacological myocardial perfusion imaging study with no significant  ischemia Septal wall hypokinesis,(possibly secondary to conduction abnormality) , EF estimated at 49%  No EKG changes concerning for ischemia at peak stress or in recovery. Resting EKG with intraventricular conduction delay Low risk scan __________   2D echo 12/08/2016: - Left ventricle: The cavity size was normal. Wall thickness was at    the upper limits of normal. Systolic function was normal. The    estimated ejection fraction was in the range of 55% to 60%. Wall    motion was normal; there were no regional wall motion    abnormalities. Doppler parameters are consistent with abnormal    left ventricular relaxation (grade 1 diastolic dysfunction).    Doppler parameters are consistent with high ventricular filling    pressure.  - Right ventricle: The cavity size was normal. Systolic function    was normal.  - Pulmonic valve: There was mild to moderate regurgitation.   Impressions:   - 1. Normal LV size and contraction. Grade 1 diastolic dysfunction.    2. Normal RV size and function.    3. Mild to moderate pulmonic regurgitation. Otherwise, no    significant valvular abnormalities.    EKG:  EKG is not ordered today.    Recent Labs: 03/01/2023: ALT 17; BUN 17; Creatinine, Ser 0.81; Potassium 4.1; Sodium 138  Recent Lipid Panel    Component Value Date/Time   CHOL 203 (H) 12/01/2021 1105    TRIG 83.0 12/01/2021 1105   HDL 48.80 12/01/2021 1105   CHOLHDL 4 12/01/2021 1105   VLDL 16.6 12/01/2021 1105   LDLCALC 138 (H) 12/01/2021 1105   LDLDIRECT 162.0 12/23/2016 1135    PHYSICAL EXAM:    VS:  BP 120/72 (BP Location: Left Arm, Patient Position: Sitting, Cuff Size: Large)   Pulse 81   Ht 5' (1.524 m)   Wt 172 lb (78 kg)   SpO2 97%   BMI 33.59 kg/m   BMI: Body mass index is 33.59 kg/m.  Physical Exam Vitals reviewed.  Constitutional:      Appearance: She is well-developed.  HENT:     Head: Normocephalic and atraumatic.  Eyes:     General:        Right eye: No discharge.        Left eye: No discharge.  Neck:     Vascular: No JVD.  Cardiovascular:     Rate and Rhythm: Normal rate and regular rhythm.     Heart sounds: Normal heart sounds, S1 normal and S2 normal. Heart sounds not distant. No midsystolic click and no opening snap. No murmur heard.    No friction rub.     Comments: Difficult to palpate DP pulses. Pulmonary:     Effort: Pulmonary effort is normal. No respiratory distress.     Breath sounds: Normal breath sounds. No decreased breath sounds, wheezing or rales.  Chest:     Chest wall: No tenderness.  Abdominal:     Palpations: Abdomen is soft.  Musculoskeletal:     Cervical back: Normal range of motion.     Right lower leg: No edema.     Left lower leg: No edema.  Skin:    General: Skin is warm and dry.     Nails: There is no clubbing.  Neurological:     Mental Status: She is alert and oriented to person, place, and time.  Psychiatric:        Speech: Speech normal.        Behavior: Behavior normal.        Thought Content: Thought content normal.        Judgment: Judgment normal.     Wt Readings from Last 3 Encounters:  03/04/23 172 lb (78 kg)  03/01/23 172 lb 6.4 oz (78.2 kg)  03/01/23 172 lb 6.4 oz (78.2 kg)     ASSESSMENT & PLAN:   HTN: Blood pressure well-controlled.  Continue irbesartan and Toprol-XL.  Recent labs showed  stable renal function and potassium.  Exertional dyspnea/chest pressure: Cardiac workup reassuring including echo and coronary CTA.  Continue aggressive risk factor modification and primary prevention.  No indication for further ischemic testing.  Mild to moderate right regurgitation: No murmur noted on exam.  Monitor periodically.  HLD: LDL 82 in 11/2022.  She remains on atorvastatin.  Lower extremity claudication: Schedule ABIs and LAA.  Preoperative cardiac risk stratification: Patient is scheduled to undergo foot surgery next week.  Per Duke Activity Status Index, she can achieve greater than 4 METs without cardiac limitation.  Per RCRI, she is low risk for noncardiac surgery with an estimated rate of 0.9% for adverse cardiac event in the perioperative timeframe.  Recent cardiac workup including coronary CTA showed a calcium score of 0 with no evidence of CAD with echo demonstrating preserved LV systolic function.  She may proceed with noncardiac surgery at an overall low risk without further cardiac intervention.   Disposition: F/u with Dr. Kirke Corin or an APP in 6 months.   Medication Adjustments/Labs and Tests Ordered: Current medicines are reviewed at length with the patient today.  Concerns regarding medicines are outlined above. Medication changes, Labs and Tests ordered today are summarized above and listed in the Patient Instructions accessible in Encounters.   Signed, Julia Listen, PA-C 03/04/2023 4:28 PM     [facility] [place] [address] [place], [place] [address] [phone]

## 2023-03-04 ENCOUNTER — Ambulatory Visit: Payer: Medicare HMO | Attending: Physician Assistant | Admitting: Physician Assistant

## 2023-03-04 ENCOUNTER — Encounter: Payer: Self-pay | Admitting: Physician Assistant

## 2023-03-04 VITALS — BP 120/72 | HR 81 | Ht 60.0 in | Wt 172.0 lb

## 2023-03-04 DIAGNOSIS — I340 Nonrheumatic mitral (valve) insufficiency: Secondary | ICD-10-CM | POA: Diagnosis not present

## 2023-03-04 DIAGNOSIS — R0602 Shortness of breath: Secondary | ICD-10-CM

## 2023-03-04 DIAGNOSIS — E785 Hyperlipidemia, unspecified: Secondary | ICD-10-CM | POA: Diagnosis not present

## 2023-03-04 DIAGNOSIS — I10 Essential (primary) hypertension: Secondary | ICD-10-CM

## 2023-03-04 DIAGNOSIS — Z01810 Encounter for preprocedural cardiovascular examination: Secondary | ICD-10-CM | POA: Diagnosis not present

## 2023-03-04 DIAGNOSIS — I739 Peripheral vascular disease, unspecified: Secondary | ICD-10-CM

## 2023-03-04 LAB — METHYLMALONIC ACID, SERUM: Methylmalonic Acid, Quant: 187 nmol/L (ref 87–318)

## 2023-03-04 LAB — INTRINSIC FACTOR ANTIBODIES: Intrinsic Factor: NEGATIVE

## 2023-03-04 NOTE — Patient Instructions (Addendum)
Medication Instructions:   Your physician recommends that you continue on your current medications as directed. Please refer to the Current Medication list given to you today.  *If you need a refill on your cardiac medications before your next appointment, please call your pharmacy*   Lab Work:  No labs ordered today  If you have labs (blood work) drawn today and your tests are completely normal, you will receive your results only by: MyChart Message (if you have MyChart) OR A paper copy in the mail If you have any lab test that is abnormal or we need to change your treatment, we will call you to review the results.   Testing/Procedures: Your physician has requested that you have an ankle brachial index (ABI). During this test an ultrasound and blood pressure cuff are used to evaluate the arteries that supply the arms and legs with blood. Allow thirty minutes for this exam. There are no restrictions or special instructions.  Your physician has requested that you have a lower or upper extremity arterial duplex. This test is an ultrasound of the arteries in the legs or arms. It looks at arterial blood flow in the legs and arms. Allow one hour for Lower and Upper Arterial scans. There are no restrictions or special instructions   Follow-Up: At [facility], you and your health needs are our priority.  As part of our continuing mission to provide you with exceptional heart care, we have created designated Provider Care Teams.  These Care Teams include your primary Cardiologist (physician) and Advanced Practice Providers (APPs -  Physician Assistants and Nurse Practitioners) who all work together to provide you with the care you need, when you need it.  We recommend signing up for the patient portal called "MyChart".  Sign up information is provided on this After Visit Summary.  MyChart is used to connect with patients for Virtual Visits (Telemedicine).  Patients are able to view  lab/test results, encounter notes, upcoming appointments, etc.  Non-urgent messages can be sent to your provider as well.   To learn more about what you can do with MyChart, go to [email or web address].    Your next appointment:   6 month(s)  Provider:   You may see Lorine Bears, MD or one of the following Advanced Practice Providers on your designated Care Team:   Nicolasa Ducking, NP Eula Listen, PA-C Cadence Fransico Michael, PA-C Charlsie Quest, NP

## 2023-03-07 NOTE — Assessment & Plan Note (Signed)
Recommended 2000 Ius daily

## 2023-03-09 ENCOUNTER — Other Ambulatory Visit: Payer: Self-pay

## 2023-03-09 ENCOUNTER — Ambulatory Visit: Payer: Medicare HMO | Admitting: Anesthesiology

## 2023-03-09 ENCOUNTER — Ambulatory Visit: Payer: Self-pay

## 2023-03-09 ENCOUNTER — Encounter: Payer: Self-pay | Admitting: Podiatry

## 2023-03-09 ENCOUNTER — Encounter
Admission: RE | Disposition: A | Discharge status: Home / Self Care | Payer: Self-pay | Source: Home / Self Care | Attending: Podiatry

## 2023-03-09 ENCOUNTER — Ambulatory Visit
Admission: RE | Admit: 2023-03-09 | Discharge: 2023-03-09 | Disposition: A | Discharge status: Home / Self Care | Payer: Medicare HMO | Attending: Podiatry | Admitting: Podiatry

## 2023-03-09 DIAGNOSIS — J45909 Unspecified asthma, uncomplicated: Secondary | ICD-10-CM | POA: Insufficient documentation

## 2023-03-09 DIAGNOSIS — I10 Essential (primary) hypertension: Secondary | ICD-10-CM | POA: Insufficient documentation

## 2023-03-09 DIAGNOSIS — M2042 Other hammer toe(s) (acquired), left foot: Secondary | ICD-10-CM | POA: Diagnosis not present

## 2023-03-09 DIAGNOSIS — M2012 Hallux valgus (acquired), left foot: Secondary | ICD-10-CM | POA: Diagnosis not present

## 2023-03-09 DIAGNOSIS — M2022 Hallux rigidus, left foot: Secondary | ICD-10-CM | POA: Insufficient documentation

## 2023-03-09 DIAGNOSIS — G4730 Sleep apnea, unspecified: Secondary | ICD-10-CM | POA: Diagnosis not present

## 2023-03-09 DIAGNOSIS — Z79899 Other long term (current) drug therapy: Secondary | ICD-10-CM | POA: Insufficient documentation

## 2023-03-09 HISTORY — PX: CAPSULOTOMY METATARSOPHALANGEAL: SHX6614

## 2023-03-09 HISTORY — DX: Presence of spectacles and contact lenses: Z97.3

## 2023-03-09 HISTORY — PX: ARTHRODESIS METATARSALPHALANGEAL JOINT (MTPJ): SHX6566

## 2023-03-09 HISTORY — PX: FLEXOR TENOTOMY: SHX6342

## 2023-03-09 SURGERY — FUSION, JOINT, GREAT TOE
Anesthesia: General | Site: Toe | Laterality: Left

## 2023-03-09 MED ORDER — MIDAZOLAM HCL 5 MG/5ML IJ SOLN
INTRAMUSCULAR | Status: DC | PRN
  Administered 2023-03-09: 2 mg via INTRAVENOUS

## 2023-03-09 MED ORDER — SODIUM CHLORIDE 0.9 % IR SOLN
Status: DC | PRN
  Administered 2023-03-09: 500 mL

## 2023-03-09 MED ORDER — ONDANSETRON HCL 4 MG/2ML IJ SOLN
INTRAMUSCULAR | Status: DC | PRN
  Administered 2023-03-09: 4 mg via INTRAVENOUS

## 2023-03-09 MED ORDER — OXYCODONE-ACETAMINOPHEN 5-325 MG PO TABS: 1.0000 | ORAL_TABLET | Freq: Four times a day (QID) | ORAL | 0 refills | Status: DC | PRN

## 2023-03-09 MED ORDER — PROPOFOL 10 MG/ML IV BOLUS
INTRAVENOUS | Status: DC | PRN
  Administered 2023-03-09: 150 mg via INTRAVENOUS

## 2023-03-09 MED ORDER — LACTATED RINGERS IV SOLN: INTRAVENOUS | Status: DC

## 2023-03-09 MED ORDER — BUPIVACAINE LIPOSOME 1.3 % IJ SUSP
INTRAMUSCULAR | Status: DC | PRN
  Administered 2023-03-09: 10 mL

## 2023-03-09 MED ORDER — FENTANYL CITRATE (PF) 100 MCG/2ML IJ SOLN
INTRAMUSCULAR | Status: DC | PRN
  Administered 2023-03-09: 100 ug via INTRAVENOUS

## 2023-03-09 MED ORDER — CEFAZOLIN SODIUM-DEXTROSE 2-4 GM/100ML-% IV SOLN: 2.0000 g | INTRAVENOUS | Status: DC

## 2023-03-09 MED ORDER — EPHEDRINE SULFATE (PRESSORS) 50 MG/ML IJ SOLN
INTRAMUSCULAR | Status: DC | PRN
  Administered 2023-03-09: 7.5 mg via INTRAVENOUS
  Administered 2023-03-09 (×3): 5 mg via INTRAVENOUS

## 2023-03-09 MED ORDER — DEXAMETHASONE SODIUM PHOSPHATE 4 MG/ML IJ SOLN
INTRAMUSCULAR | Status: DC | PRN
  Administered 2023-03-09: 4 mg via INTRAVENOUS

## 2023-03-09 MED ORDER — CLINDAMYCIN PHOSPHATE 600 MG/50ML IV SOLN
INTRAVENOUS | Status: DC | PRN
  Administered 2023-03-09: 600 mg via INTRAVENOUS

## 2023-03-09 MED ORDER — BUPIVACAINE HCL (PF) 0.25 % IJ SOLN
INTRAMUSCULAR | Status: DC | PRN
  Administered 2023-03-09: 10 mL

## 2023-03-09 MED ORDER — PHENYLEPHRINE HCL (PRESSORS) 10 MG/ML IV SOLN
INTRAVENOUS | Status: DC | PRN
  Administered 2023-03-09 (×3): 100 ug via INTRAVENOUS

## 2023-03-09 SURGICAL SUPPLY — 52 items
APL SKNCLS STERI-STRIP NONHPOA (GAUZE/BANDAGES/DRESSINGS) ×2
BENZOIN TINCTURE PRP APPL 2/3 (GAUZE/BANDAGES/DRESSINGS) ×2 IMPLANT
BIT DRILL 2 FAST STEP (BIT) IMPLANT
BIT DRILL STRATUM ST W/SLV 2.7 (BIT) IMPLANT
BNDG CMPR 5X4 CHSV STRCH STRL (GAUZE/BANDAGES/DRESSINGS) ×2
BNDG CMPR 75X41 PLY HI ABS (GAUZE/BANDAGES/DRESSINGS) ×2
BNDG CMPR STD VLCR NS LF 5.8X4 (GAUZE/BANDAGES/DRESSINGS) ×2
BNDG COHESIVE 4X5 TAN STRL LF (GAUZE/BANDAGES/DRESSINGS) ×2 IMPLANT
BNDG ELASTIC 4X5.8 VLCR NS LF (GAUZE/BANDAGES/DRESSINGS) ×2 IMPLANT
BNDG ESMARCH 4 X 12 STRL LF (GAUZE/BANDAGES/DRESSINGS) ×2
BNDG ESMARCH 4X12 STRL LF (GAUZE/BANDAGES/DRESSINGS) ×2 IMPLANT
BNDG GAUZE DERMACEA FLUFF 4 (GAUZE/BANDAGES/DRESSINGS) ×2 IMPLANT
BNDG GZE DERMACEA 4 6PLY (GAUZE/BANDAGES/DRESSINGS) ×2
BNDG STRETCH 4X75 STRL LF (GAUZE/BANDAGES/DRESSINGS) ×2 IMPLANT
BOOT STEPPER DURA SM (SOFTGOODS) IMPLANT
CANISTER SUCT 1200ML W/VALVE (MISCELLANEOUS) ×2 IMPLANT
COVER LIGHT HANDLE UNIVERSAL (MISCELLANEOUS) ×4 IMPLANT
CUFF TOURN SGL QUICK 18X4 (TOURNIQUET CUFF) IMPLANT
DRAPE FLUOR MINI C-ARM 54X84 (DRAPES) ×2 IMPLANT
DRILL STRATUM ST W/SLV 2.7 (BIT) ×4
DURAPREP 26ML APPLICATOR (WOUND CARE) ×2 IMPLANT
ELECT REM PT RETURN 9FT ADLT (ELECTROSURGICAL) ×2
ELECTRODE REM PT RTRN 9FT ADLT (ELECTROSURGICAL) ×2 IMPLANT
GAUZE SPONGE 4X4 12PLY STRL (GAUZE/BANDAGES/DRESSINGS) ×2 IMPLANT
GAUZE XEROFORM 1X8 LF (GAUZE/BANDAGES/DRESSINGS) ×2 IMPLANT
GLOVE SRG 8 PF TXTR STRL LF DI (GLOVE) ×4 IMPLANT
GLOVE SURG ENC MOIS LTX SZ7.5 (GLOVE) ×4 IMPLANT
GLOVE SURG UNDER POLY LF SZ8 (GLOVE) ×4
GOWN STRL REUS W/ TWL LRG LVL3 (GOWN DISPOSABLE) ×4 IMPLANT
GOWN STRL REUS W/TWL LRG LVL3 (GOWN DISPOSABLE) ×4
K-WIRE ACE 1.6X6 (WIRE) ×4
KIT INSTRUMENT MPJ STRATUM (KITS) IMPLANT
KIT TURNOVER KIT A (KITS) ×2 IMPLANT
KWIRE ACE 1.6X6 (WIRE) IMPLANT
NS IRRIG 500ML POUR BTL (IV SOLUTION) ×2 IMPLANT
PACK EXTREMITY ARMC (MISCELLANEOUS) ×2 IMPLANT
PLATE MPJ 1ST STRM SM 0D LT (Plate) IMPLANT
SCREW  STRATUM NL LP ST 3.5X18 (Screw) ×2 IMPLANT
SCREW NLOCK LP ST STRM 2.7X14 (Screw) IMPLANT
SCREW STRATUM NL LP ST 3.5X18 (Screw) IMPLANT
SCREW STRM LOCK 2.7X12 ST (Screw) IMPLANT
SCREW STRM LOCK 3.5X14 (Screw) IMPLANT
STOCKINETTE IMPERVIOUS LG (DRAPES) ×2 IMPLANT
STRIP CLOSURE SKIN 1/4X4 (GAUZE/BANDAGES/DRESSINGS) ×2 IMPLANT
SUT ETHILON 3-0 (SUTURE) IMPLANT
SUT MNCRL 4-0 (SUTURE) ×2
SUT MNCRL 4-0 27XMFL (SUTURE) ×2
SUT VIC AB 3-0 SH 27 (SUTURE) ×2
SUT VIC AB 3-0 SH 27X BRD (SUTURE) IMPLANT
SUT VIC AB 4-0 SH 27 (SUTURE) ×2
SUT VIC AB 4-0 SH 27XANBCTRL (SUTURE) IMPLANT
SUTURE MNCRL 4-0 27XMF (SUTURE) ×2 IMPLANT

## 2023-03-09 NOTE — Discharge Instructions (Signed)
[facility]  POST OPERATIVE INSTRUCTIONS FOR DR. FOWLER AND [facility]   Take your medication as prescribed.  Pain medication should be taken only as needed.  Keep the dressing clean, dry and intact.  Keep your foot elevated above the heart level for the first 48 hours.  Walking to the bathroom and brief periods of walking are acceptable, unless we have instructed you to be non-weight bearing.  Always wear your post-op shoe when walking.  Always use your crutches if you are to be non-weight bearing.  Do not take a shower. Baths are permissible as long as the foot is kept out of the water.   Every hour you are awake:  Bend your knee 15 times. Flex foot 15 times Massage calf 15 times  Call [facility] ([phone]) if any of the following problems occur: You develop a temperature or fever. The bandage becomes saturated with blood. Medication does not stop your pain. Injury of the foot occurs. Any symptoms of infection including redness, odor, or red streaks running from wound. 

## 2023-03-09 NOTE — H&P (Signed)
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  The various methods of treatment have been discussed with the patient.  No guarantees were given.  After consideration of risks, benefits and other options for treatment, the patient has consented to surgery.  I have reviewed the patients' chart and labs.     A history and physical examination was performed in my office.  The patient was reexamined.  There have been no changes to this history and physical examination.  Gwyneth Revels A

## 2023-03-09 NOTE — Transfer of Care (Signed)
Immediate Anesthesia Transfer of Care Note  Patient: Julia Cooke  Procedure(s) Performed: ARTHRODESIS METATARSALPHALANGEAL JOINT (MTPJ) (Left: Toe) CAPSULOTOMY METATARSOPHALANGEAL SECOND (Left: Foot) FLEXOR TENOTOMY (Left: Foot)  Patient Location: PACU  Anesthesia Type: General ETT  Level of Consciousness: awake, alert  and patient cooperative  Airway and Oxygen Therapy: Patient Spontanous Breathing and Patient connected to supplemental oxygen  Post-op Assessment: Post-op Vital signs reviewed, Patient's Cardiovascular Status Stable, Respiratory Function Stable, Patent Airway and No signs of Nausea or vomiting  Post-op Vital Signs: Reviewed and stable  Complications: No notable events documented.

## 2023-03-09 NOTE — Op Note (Signed)
Operative note   Surgeon:Elmor Kost Armed forces logistics/support/administrative officer: None    Preop diagnosis: 1.  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  Arthrodesis left first MTPJ 2.  Metatarsophalangeal joint release left second toe joint 3.  Extensor tendon lengthening left second MTPJ 4.  Intraoperative fluoroscopy use without assistance of radiologist    EBL: Minimal    Anesthesia:local and general.  Local consisted of a one-to-one mixture of 0.25% bupivacaine and Exparel long-acting anesthetic in a local block fashion.  A total of 20 cc was used    Hemostasis: Calf tourniquet inflated to 200 mmHg for approximately 80 minutes    Specimen: None    Complications: None    Operative indications:Julia Cooke is an 72 y.o. that presents today for surgical intervention.  The risks/benefits/alternatives/complications have been discussed and consent has been given.    Procedure:  Patient was brought into the OR and placed on the operating table in thesupine position. After anesthesia was obtained theleft lower extremity was prepped and draped in usual sterile fashion.  Attention was directed to the dorsomedial left first MTPJ where a dorsomedial incision was performed.  Sharp and blunt dissection carried down to the capsule.  Longitudinal capsulotomy was performed.  The head of the metatarsal and base of the proximal phalanx were exposed.  Marked degenerative changes of the first MTPJ with minimal articular cartilage was noted.  Next with a cup and cone reamer I was able to remove residual articular cartilage down to the subchondral bone plate to good healthy bleeding bone on both sides of the joint.  The wound was flushed with copious amounts of irrigation.  A 2.0 mm drill bit was used to prepare the joint.  The toe was held in a neutral position both in line with the lesser toes and just slightly dorsal to  the flat surface of the plantar aspect of the foot.  At this time a Biomet stratum first MTPJ fusion plate standard with 0 degrees of angulation was placed over the first MTPJ.  The distal screws were filled with 2.7 millimeter screws in the proximal screws filled with 3.5 millimeter screws.  Good compression and stability was noted.  The wound was flushed with copious amounts of irrigation.  Fluoroscopy intraoperatively was used to assist with alignment and placement of the plate and screws.  Attention was directed to the second PIPJ where a dorsal incision was performed.  Sharp and blunt dissection carried down to the extensor tendon.  A Z-lengthening procedure was performed to the tendon.  The dorsal medial and lateral capsule was then released at this time.  Good realignment of the second toe was noted at this time.  The wound was flushed with copious amounts of irrigation.  Closure to all wounds was then performed.  4-0 Vicryl was used for the extensor tendon.  3-0 Vicryl for the subcutaneous tissue of the first MTPJ region.  4-0 Vicryl for the subcuticular region of the first and second toe joints.  A 4-0 Monocryl was used in a subcuticular fashion for the skin.  A bulky sterile dressing was applied.  She was then placed in an equalizer walker boot with the foot in a neutral position.    Patient tolerated the procedure and anesthesia well.  Was transported from the OR to the PACU with all vital signs stable and vascular status intact. To  be discharged per routine protocol.  Will follow up in approximately 1 week in the outpatient clinic.

## 2023-03-10 ENCOUNTER — Encounter: Payer: Self-pay | Admitting: Podiatry

## 2023-03-10 NOTE — Anesthesia Postprocedure Evaluation (Signed)
Anesthesia Post Note  Patient: Aloha Knieriem  Procedure(s) Performed: ARTHRODESIS METATARSALPHALANGEAL JOINT (MTPJ) (Left: Toe) CAPSULOTOMY METATARSOPHALANGEAL SECOND (Left: Foot) FLEXOR TENOTOMY (Left: Foot)  Patient location during evaluation: PACU Anesthesia Type: General Level of consciousness: awake and alert Pain management: pain level controlled Vital Signs Assessment: post-procedure vital signs reviewed and stable Respiratory status: spontaneous breathing, nonlabored ventilation, respiratory function stable and patient connected to nasal cannula oxygen Cardiovascular status: blood pressure returned to baseline and stable Postop Assessment: no apparent nausea or vomiting Anesthetic complications: no   No notable events documented.   Last Vitals:  Vitals:   03/09/23 1415 03/09/23 1430  BP: 131/61 (!) 124/53  Pulse: 96 91  Resp: 14 18  Temp: 36.6 C 36.7 C  SpO2: 96% 94%    Last Pain:  Vitals:   03/09/23 1419  TempSrc:   PainSc: 0-No pain                 Walid Haig C Marissah Vandemark

## 2023-03-15 DIAGNOSIS — M2022 Hallux rigidus, left foot: Secondary | ICD-10-CM | POA: Diagnosis not present

## 2023-03-15 DIAGNOSIS — M79672 Pain in left foot: Secondary | ICD-10-CM | POA: Diagnosis not present

## 2023-04-04 ENCOUNTER — Ambulatory Visit
Admission: RE | Admit: 2023-04-04 | Discharge: 2023-04-04 | Disposition: A | Discharge status: Home / Self Care | Payer: Medicare HMO | Source: Ambulatory Visit | Attending: Internal Medicine | Admitting: Internal Medicine

## 2023-04-04 DIAGNOSIS — Z1231 Encounter for screening mammogram for malignant neoplasm of breast: Secondary | ICD-10-CM | POA: Insufficient documentation

## 2023-04-05 ENCOUNTER — Encounter: Payer: Medicare HMO | Admitting: *Deleted

## 2023-04-08 ENCOUNTER — Ambulatory Visit: Payer: Medicare HMO | Attending: Physician Assistant

## 2023-04-08 DIAGNOSIS — I739 Peripheral vascular disease, unspecified: Secondary | ICD-10-CM

## 2023-04-08 LAB — VAS US ABI WITH/WO TBI
Left ABI: 1.15
Right ABI: 1.16

## 2023-04-11 NOTE — Progress Notes (Deleted)
Cardiology Office Note  Date:  04/11/2023   ID:  [name], [name] 10/16/1951, MRN [id number]  PCP:  Sherlene Shams, MD   No chief complaint on file.   HPI:  Ms. Julia Cooke is a 72 year old woman with past medical history of Hypertension Hyperlipidemia Bipolar Left bundle branch block Who presents by referral from Dr. Darrick Huntsman for chest pain, hypertension  Seen by one of our providers March 04, 2023   Cardiac CTA February 2024 Calcium score 0 No significant coronary disease   evaluated by Dr. Alvino Chapel with echo in 11/2016 showing Julia EF of 55 to 60%, normal wall motion, grade 1 diastolic dysfunction, and mild to moderate pulmonic regurgitation.    Lexiscan MPI in 11/2016 showed no evidence of significant ischemia and was overall low risk.     Seen by Dr. Kirke Corin in 10/2022 for evaluation of worsening exertional dyspnea with associated chest discomfort described as burning with exertion.   blood pressure uncontrolled despite losartan and metoprolol.    coronary CTA on 01/03/2023 that showed a calcium score of 0 with no evidence of CAD.  There were no significant extracardiac abnormalities noted.    Echo on 01/04/2023 showed Julia EF of 55 to 60%, no regional wall motion abnormalities, moderate concentric LVH of the inferior and inferolateral segments, grade 1 diastolic dysfunction, normal RV systolic function and ventricular cavity size,   Dr. Kirke Corin on 01/07/2023 improvement in chest pain with stable shortness of breath.   Toprol was transitioned to carvedilol 6.25 mg twice daily with continuation of losartan 100 mg.  With this, she contacted our office through MyChart noting continued elevations in her BP in the 150s over 90s as well as polyarthralgias and some chest heaviness.      transitioned from carvedilol back to Toprol-XL at 50 mg daily with transition from losartan to irbesartan 300 mg daily.   She comes in doing well from a cardiac perspective and is without symptoms of angina or  cardiac decompensation.  Her blood pressure has been well-controlled since making the above changes.  She is tolerating irbesartan and metoprolol without issues.     PMH:   has a past medical history of Bipolar disorder (HCC), Depression, Dysrhythmia (10/2016), Heart murmur, Hypertension, Nephrolithiasis (2000), Psychotic disorder (HCC), and Wears contact lenses.  PSH:    Past Surgical History:  Procedure Laterality Date   ARTHRODESIS METATARSALPHALANGEAL JOINT (MTPJ) Left 03/09/2023   Procedure: ARTHRODESIS METATARSALPHALANGEAL JOINT (MTPJ);  Surgeon: Gwyneth Revels, DPM;  Location: [facility];  Service: Podiatry;  Laterality: Left;   CAPSULOTOMY METATARSOPHALANGEAL Left 03/09/2023   Procedure: CAPSULOTOMY METATARSOPHALANGEAL SECOND;  Surgeon: Gwyneth Revels, DPM;  Location: [facility];  Service: Podiatry;  Laterality: Left;   EXTRACORPOREAL SHOCK WAVE LITHOTRIPSY Left 01/06/2017   Procedure: EXTRACORPOREAL SHOCK WAVE LITHOTRIPSY (ESWL);  Surgeon: Vanna Scotland, MD;  Location: [facility];  Service: Urology;  Laterality: Left;   FLEXOR TENOTOMY  Left 03/09/2023   Procedure: FLEXOR TENOTOMY;  Surgeon: Gwyneth Revels, DPM;  Location: [facility];  Service: Podiatry;  Laterality: Left;   LITHOTRIPSY  2000    Current Outpatient Medications  Medication Sig Dispense Refill   amLODipine (NORVASC) 2.5 MG tablet Take 1 tablet (2.5 mg total) by mouth at bedtime. 90 tablet 1   atorvastatin (LIPITOR) 10 MG tablet Take 1 tablet (10 mg total) by mouth daily. 90 tablet 0   Cholecalciferol (VITAMIN D-3 PO) Take by mouth daily.     Cyanocobalamin (VITAMIN B-12 PO) Take by  mouth daily.     gabapentin (NEURONTIN) 100 MG capsule Take 1 capsule (100 mg total) by mouth 3 (three) times daily. 90 capsule 3   irbesartan (AVAPRO) 300 MG tablet Take 1 tablet (300 mg total) by mouth daily. 90 tablet 3   lamoTRIgine (LAMICTAL) 25 MG tablet Take 1 tablet (25 mg total) by mouth 2 (two) times daily.  180 tablet 1   melatonin 5 MG TABS Take 5 mg by mouth.     metoprolol succinate (TOPROL-XL) 50 MG 24 hr tablet Take 1 tablet (50 mg total) by mouth daily. Take with or immediately following a meal. 90 tablet 3   Multiple Vitamin (MULTIVITAMIN) tablet Take 1 tablet by mouth daily.     omeprazole (PRILOSEC) 20 MG capsule Take 1 capsule (20 mg total) by mouth daily. 30 capsule 3   oxyCODONE-acetaminophen (PERCOCET) 5-325 MG tablet Take 1-2 tablets by mouth every 6 (six) hours as needed for severe pain. Max 6 tabs per day 30 tablet 0   No current facility-administered medications for this visit.     Allergies:   Penicillins   Social History:  The patient  reports that she has never smoked. She has never used smokeless tobacco. She reports that she does not drink alcohol and does not use drugs.   Family History:   family history includes Breast cancer (age of onset: 70) in her sister; Cancer in her sister; Fibromyalgia in her sister; Heart Problems in her sister; Heart attack in her brother; Heart disease in her brother; Hyperlipidemia in her father and mother; Hypertension in her brother, father, mother, sister, and sister; Stroke in her mother.    Review of Systems: ROS   PHYSICAL EXAM: VS:  There were no vitals taken for this visit. , BMI There is no height or weight on file to calculate BMI. GEN: Well nourished, well developed, in no acute distress HEENT: normal Neck: no JVD, carotid bruits, or masses Cardiac: RRR; no murmurs, rubs, or gallops,no edema  Respiratory:  clear to auscultation bilaterally, normal work of breathing GI: soft, nontender, nondistended, + BS MS: no deformity or atrophy Skin: warm and dry, no rash Neuro:  Strength and sensation are intact Psych: euthymic mood, full affect    Recent Labs: 03/01/2023: ALT 17; BUN 17; Creatinine, Ser 0.81; Potassium 4.1; Sodium 138    Lipid Panel Lab Results  Component Value Date   CHOL 203 (H) 12/01/2021   HDL 48.80  12/01/2021   LDLCALC 138 (H) 12/01/2021   TRIG 83.0 12/01/2021      Wt Readings from Last 3 Encounters:  03/09/23 174 lb (78.9 kg)  03/04/23 172 lb (78 kg)  03/01/23 172 lb 6.4 oz (78.2 kg)       ASSESSMENT AND PLAN:  Problem List Items Addressed This Visit   None    Disposition:   F/U  12 months   Total encounter time more than 30 minutes  Greater than 50% was spent in counseling and coordination of care with the patient    Signed, Dossie Arbour, M.D., Ph.D. [facility] [place], [place] [phone]

## 2023-04-12 ENCOUNTER — Ambulatory Visit: Payer: Medicare HMO | Attending: Cardiovascular Disease | Admitting: Cardiovascular Disease

## 2023-04-12 DIAGNOSIS — E785 Hyperlipidemia, unspecified: Secondary | ICD-10-CM

## 2023-04-12 DIAGNOSIS — I10 Essential (primary) hypertension: Secondary | ICD-10-CM

## 2023-04-12 DIAGNOSIS — R079 Chest pain, unspecified: Secondary | ICD-10-CM

## 2023-04-12 DIAGNOSIS — I340 Nonrheumatic mitral (valve) insufficiency: Secondary | ICD-10-CM

## 2023-04-12 DIAGNOSIS — R0602 Shortness of breath: Secondary | ICD-10-CM

## 2023-04-13 ENCOUNTER — Encounter: Payer: Self-pay | Admitting: Cardiovascular Disease

## 2023-04-14 ENCOUNTER — Ambulatory Visit: Payer: Medicare HMO | Admitting: Cardiovascular Disease

## 2023-04-17 ENCOUNTER — Other Ambulatory Visit: Payer: Self-pay | Admitting: Internal Medicine

## 2023-04-18 NOTE — Telephone Encounter (Signed)
Medication was discontinued on 06/17/22, is it okay to refuse?

## 2023-04-19 DIAGNOSIS — M2022 Hallux rigidus, left foot: Secondary | ICD-10-CM | POA: Diagnosis not present

## 2023-05-12 ENCOUNTER — Telehealth (INDEPENDENT_AMBULATORY_CARE_PROVIDER_SITE_OTHER): Payer: Medicare HMO | Admitting: Psychiatry

## 2023-05-12 ENCOUNTER — Encounter: Payer: Self-pay | Admitting: Psychiatry

## 2023-05-12 DIAGNOSIS — G4701 Insomnia due to medical condition: Secondary | ICD-10-CM | POA: Diagnosis not present

## 2023-05-12 DIAGNOSIS — F3178 Bipolar disorder, in full remission, most recent episode mixed: Secondary | ICD-10-CM

## 2023-05-12 MED ORDER — ZOLPIDEM TARTRATE 5 MG PO TABS
2.5000 mg | ORAL_TABLET | Freq: Every evening | ORAL | 0 refills | Status: DC | PRN
Start: 2023-05-12 — End: 2023-07-12

## 2023-05-12 MED ORDER — LAMOTRIGINE 25 MG PO TABS
25.0000 mg | ORAL_TABLET | Freq: Two times a day (BID) | ORAL | 1 refills | Status: DC
Start: 2023-05-12 — End: 2024-04-04
  Filled 2023-06-24 – 2023-11-07 (×2): qty 180, 90d supply, fill #0

## 2023-05-12 NOTE — Patient Instructions (Signed)
[email or web address]  www.psychologytoday  [employer] [email or web address] [address], [place], [place] [address]  [phone]  [facility], [facility] [email or web address] [address] [place], [place] [address]   [phone]   [facility] - [phone]  [facility] - [phone]  [facility] - [phone] counseling [phone]  [facility] [phone]   [email or web address] this clinician can offer telehealth and has a sliding scale option  [email or web address] this group also offers sliding scale rates and is based out of [place]    [employer] [facility] has interns who offer sliding scale rates and some of the full time clinicians do, as well. You complete their contact form on their website and the referrals coordinator will help to get connected to someone    Zolpidem Tablets What is this medication? ZOLPIDEM (zole PI dem) treats insomnia. It helps you get to sleep faster and stay asleep throughout the night. It is often used for a short period of time. This medicine may be used for other purposes; ask your health care provider or pharmacist if you have questions. COMMON BRAND NAME(S): Ambien What should I tell my care team before I take this medication? They need to know if you have any of these conditions: Depression Frequently drink alcohol Liver disease Lung or breathing disease Myasthenia gravis Sleep apnea Substance use disorder Suicidal thoughts, plans, or attempt by you or a family member Unusual sleep behaviors or activities you do not remember An unusual or allergic reaction to zolpidem, other medications, foods, dyes, or preservatives Pregnant or trying to get pregnant Breastfeeding How should I use this medication? Take this medication by mouth  with water. Take it as directed on the prescription label. It is better to take this medication on an empty stomach and only when you are ready for bed. Do not take your medication more often than directed. If you have been taking this medication for several weeks and suddenly stop taking it, you may get unpleasant withdrawal symptoms. Your care team may want to gradually reduce the dose. Do not stop taking this medication on your own. Always follow your care team's advice. A special MedGuide will be given to you by the pharmacist with each prescription and refill. Be sure to read this information carefully each time. Talk to your care team about the use of this medication in children. Special care may be needed. Overdosage: If you think you have taken too much of this medicine contact a poison control center or emergency room at once. NOTE: This medicine is only for you. Do not share this medicine with others. What if I miss a dose? This does not apply. This medication should only be taken immediately before going to sleep. Do not take double or extra doses. What may interact with this medication? Alcohol Antihistamines for allergy, cough, and cold Certain medications for anxiety or sleep Certain medications for depression, such as amitriptyline, fluoxetine, sertraline Certain medications for fungal infections, such as ketoconazole and itraconazole Certain medications for seizures, such as phenobarbital, primidone Ciprofloxacin Dietary supplements for sleep, such as valerian or kava kava General anesthetics, such as halothane, isoflurane, methoxyflurane, propofol Local anesthetics, such as lidocaine, pramoxine, tetracaine Medications that relax muscles for surgery Opioid medications for pain Phenothiazines, such as chlorpromazine, mesoridazine, prochlorperazine, thioridazine Rifampin This list may not describe all possible interactions. Give your health care provider a list of all the  medicines,  herbs, non-prescription drugs, or dietary supplements you use. Also tell them if you smoke, drink alcohol, or use illegal drugs. Some items may interact with your medicine. What should I watch for while using this medication? Visit your care team for regular checks on your progress. Keep a regular sleep schedule by going to bed at about the same time each night. Avoid caffeine-containing drinks in the evening hours. When sleep medications are used every night for more than a few weeks, they may stop working. Talk to your care team if you still have trouble sleeping. You may do unusual sleep behaviors or activities you do not remember the day after taking this medication. Activities include driving, making or eating food, talking on the phone, sexual activity, or sleep walking. Stop taking this medication and call your care team right away if you find out you have done activities like this. Plan to go to bed and stay in bed for a full night (7 to 8 hours) after you take this medication. You may still be drowsy the morning after taking this medication. This medication may affect your coordination, reaction time, or judgment. Do not drive or operate machinery until you know how this medication affects you. Sit up or stand slowly to reduce the risk of dizzy or fainting spells. If you or your family notice any changes in your behavior, such as new or worsening depression, thoughts of harming yourself, anxiety, other unusual or disturbing thoughts, or memory loss, call your care team right away. After you stop taking this medication, you may have trouble falling asleep. This is called rebound insomnia. This problem usually goes away on its own after 1 or 2 nights. What side effects may I notice from receiving this medication? Side effects that you should report to your care team as soon as possible: Allergic reactions--skin rash, itching, hives, swelling of the face, lips, tongue, or throat Change in  vision such as blurry vision, seeing halos around lights, vision loss CNS depression--slow or shallow breathing, shortness of breath, feeling faint, dizziness, confusion, difficulty staying awake Mood and behavior changes--anxiety, nervousness, confusion, hallucinations, irritability, hostility, thoughts of suicide or self-harm, worsening mood, feelings of depression Unusual sleep behaviors or activities you do not remember such as driving, eating, or sexual activity Side effects that usually do not require medical attention (report to your care team if they continue or are bothersome): Diarrhea Dizziness Drowsiness the day after use Headache This list may not describe all possible side effects. Call your doctor for medical advice about side effects. You may report side effects to FDA at 1-800-FDA-1088. Where should I keep my medication? Keep out of the reach of children and pets. This medication can be abused. Keep your medication in a safe place to protect it from theft. Do not share this medication with anyone. Selling or giving away this medication is dangerous and against the law. Store at room temperature between 20 and 25 degrees C (68 and 77 degrees F). This medication may cause accidental overdose and death if taken by other adults, children, or pets. Mix any unused medication with a substance like cat litter or coffee grounds. Then throw the medication away in a sealed container like a sealed bag or a coffee can with a lid. Do not use the medication after the expiration date. NOTE: This sheet is a summary. It may not cover all possible information. If you have questions about this medicine, talk to your doctor, pharmacist, or health care provider.  2024  Elsevier/Gold Standard (2022-11-14 00:00:00)

## 2023-05-12 NOTE — Progress Notes (Signed)
Virtual Visit via Video Note  I connected with Julia Cooke on 05/12/23 at  1:00 PM EDT by a video enabled telemedicine application and verified that I am speaking with the correct person using two identifiers.  Location Provider Location : [facility] Patient Location : Car  Participants: Patient , Provider    I discussed the limitations of evaluation and management by telemedicine and the availability of in person appointments. The patient expressed understanding and agreed to proceed.   I discussed the assessment and treatment plan with the patient. The patient was provided an opportunity to ask questions and all were answered. The patient agreed with the plan and demonstrated an understanding of the instructions.   The patient was advised to call back or seek an in-person evaluation if the symptoms worsen or if the condition fails to improve as anticipated.   BH MD OP Progress Note  05/13/2023 8:26 AM Julia Cooke  MRN:  [id number]  Chief Complaint:  Chief Complaint  Patient presents with   Follow-up   Depression   Anxiety   Medication Refill   HPI: Julia Cooke is a 72 year old Caucasian female, married, on SSI, lives in [place], has a history of bipolar disorder was evaluated by telemedicine today.  Patient today reports she is currently going through multiple situational stressors.  Patient reports she and her spouse were living with their daughter.  They have decided to move out and find another house since her daughter is planning to start a family soon.  Patient reports she has been anxious about that since she does not know if she will be able to financially afford it.  Her husband is currently back at work however he does not have any stability at work at this time.  She hence is trying to start back doing an [employer].  She feels confident she can do it.  Patient denies any significant mood swings.  Reports the Lamictal is beneficial.  Denies side  effects.  She does struggle with sleep.  She does try to have a good sleep hygiene however she has to take care of her dog at night and hence sleep is all over the place.  Agreeable to a trial of low-dose Ambien.  Patient denies any suicidality, homicidality or perceptual disturbances.  Motivated to start CBT when this was discussed with patient.  Patient denies any other concerns today. Visit Diagnosis:    ICD-10-CM   1. Bipolar disorder, in full remission, most recent episode mixed (HCC)  F31.78 lamoTRIgine (LAMICTAL) 25 MG tablet    2. Insomnia due to medical condition  G47.01 zolpidem (AMBIEN) 5 MG tablet   Mood , pain      Past Psychiatric History: I have reviewed past psychiatric history from progress note on 05/02/2018.  Past trials of Abilify, Seroquel.  Past Medical History:  Past Medical History:  Diagnosis Date   Bipolar disorder (HCC)    takes Abilify   Depression    Dysrhythmia 10/2016   LBBB on ekg...nothing to compare it to.   Heart murmur    Hypertension    Nephrolithiasis 2000   s/p lithotripsy   Psychotic disorder (HCC)    mania   Wears contact lenses     Past Surgical History:  Procedure Laterality Date   ARTHRODESIS METATARSALPHALANGEAL JOINT (MTPJ) Left 03/09/2023   Procedure: ARTHRODESIS METATARSALPHALANGEAL JOINT (MTPJ);  Surgeon: Gwyneth Revels, DPM;  Location: [facility];  Service: Podiatry;  Laterality: Left;   CAPSULOTOMY METATARSOPHALANGEAL Left  03/09/2023   Procedure: CAPSULOTOMY METATARSOPHALANGEAL SECOND;  Surgeon: Gwyneth Revels, DPM;  Location: [facility];  Service: Podiatry;  Laterality: Left;   EXTRACORPOREAL SHOCK WAVE LITHOTRIPSY Left 01/06/2017   Procedure: EXTRACORPOREAL SHOCK WAVE LITHOTRIPSY (ESWL);  Surgeon: Vanna Scotland, MD;  Location: [facility];  Service: Urology;  Laterality: Left;   FLEXOR TENOTOMY  Left 03/09/2023   Procedure: FLEXOR TENOTOMY;  Surgeon: Gwyneth Revels, DPM;  Location: [facility];   Service: Podiatry;  Laterality: Left;   LITHOTRIPSY  2000    Family Psychiatric History: I have reviewed family psychiatric history from progress note on 05/02/2018.  Family History:  Family History  Problem Relation Age of Onset   Hyperlipidemia Mother    Stroke Mother    Hypertension Mother    Hyperlipidemia Father    Hypertension Father    Cancer Sister        breast   Breast cancer Sister 10   Fibromyalgia Sister    Hypertension Sister    Heart Problems Sister    Hypertension Sister    Heart disease Brother        myocardial infarction   Heart attack Brother    Hypertension Brother    Prostate cancer Neg Hx    Kidney cancer Neg Hx    Bladder Cancer Neg Hx     Social History: I have reviewed social history from progress note on 05/02/2018. Social History   Socioeconomic History   Marital status: Married    Spouse name: Julia Cooke   Number of children: 3   Years of education: Not on file   Highest education level: Some college, no degree  Occupational History    Comment: retired  Tobacco Use   Smoking status: Never   Smokeless tobacco: Never  Vaping Use   Vaping Use: Never used  Substance and Sexual Activity   Alcohol use: No    Alcohol/week: 0.0 - 2.0 standard drinks of alcohol    Comment: 2-3 times weekly   Drug use: No   Sexual activity: Yes    Birth control/protection: Post-menopausal, None  Other Topics Concern   Not on file  Social History Narrative   Patient lives at home with her husband of 41 years. They have 3 adult children (2 boys, 1 girl). They recently moved back to [place] in January. 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 Patient has also worked as a Occupational hygienist.   Social Determinants of Health   Financial Resource Strain: High Risk (03/01/2023)   Overall Financial Resource Strain (CARDIA)    Difficulty of Paying Living Expenses: Hard  Food Insecurity: Food Insecurity Present (03/01/2023)   Hunger Vital Sign     Worried About Running Out of Food in the Last Year: Sometimes true    Ran Out of Food in the Last Year: Never true  Transportation Needs: No Transportation Needs (03/01/2023)   PRAPARE - Administrator, Civil Service (Medical): No    Lack of Transportation (Non-Medical): No  Physical Activity: Inactive (03/01/2023)   Exercise Vital Sign    Days of Exercise per Week: 0 days    Minutes of Exercise per Session: 0 min  Stress: No Stress Concern Present (03/01/2023)   [employer] of Occupational Health - Occupational Stress Questionnaire    Feeling of Stress : Not at all  Social Connections: Socially Integrated (03/01/2023)   Social Connection and Isolation Panel [NHANES]    Frequency of Communication with Friends  and Family: More than three times a week    Frequency of Social Gatherings with Friends and Family: Twice a week    Attends Religious Services: More than 4 times per year    Active Member of [employer] or Organizations: Yes    Attends Engineer, structural: More than 4 times per year    Marital Status: Married    Allergies:  Allergies  Allergen Reactions   Penicillins Swelling    As a child    Metabolic Disorder Labs: Lab Results  Component Value Date   HGBA1C 5.2 03/01/2023   MPG 94 01/20/2018   Lab Results  Component Value Date   PROLACTIN 3.2 (L) 01/20/2018   Lab Results  Component Value Date   CHOL 203 (H) 12/01/2021   TRIG 83.0 12/01/2021   HDL 48.80 12/01/2021   CHOLHDL 4 12/01/2021   VLDL 16.6 12/01/2021   LDLCALC 138 (H) 12/01/2021   LDLCALC 154 (H) 04/08/2021   Lab Results  Component Value Date   TSH 0.89 12/01/2021   TSH 0.95 04/08/2021    Therapeutic Level Labs: No results found for: "LITHIUM" No results found for: "VALPROATE" Lab Results  Component Value Date   CBMZ 2.9 (L) 01/07/2014    Current Medications: Current Outpatient Medications  Medication Sig Dispense Refill   amLODipine (NORVASC) 2.5 MG tablet Take 1 tablet  (2.5 mg total) by mouth at bedtime. 90 tablet 1   atorvastatin (LIPITOR) 10 MG tablet Take 1 tablet (10 mg total) by mouth daily. 90 tablet 0   gabapentin (NEURONTIN) 100 MG capsule Take 1 capsule (100 mg total) by mouth 3 (three) times daily. 90 capsule 3   irbesartan (AVAPRO) 300 MG tablet Take 1 tablet (300 mg total) by mouth daily. 90 tablet 3   melatonin 5 MG TABS Take 5 mg by mouth.     metoprolol succinate (TOPROL-XL) 50 MG 24 hr tablet Take 1 tablet (50 mg total) by mouth daily. Take with or immediately following a meal. 90 tablet 3   Multiple Vitamin (MULTIVITAMIN) tablet Take 1 tablet by mouth daily.     omeprazole (PRILOSEC) 20 MG capsule Take 1 capsule (20 mg total) by mouth daily. 30 capsule 3   zolpidem (AMBIEN) 5 MG tablet Take 0.5-1 tablets (2.5-5 mg total) by mouth at bedtime as needed for sleep. 30 tablet 0   Cholecalciferol (VITAMIN D-3 PO) Take by mouth daily. (Patient not taking: Reported on 05/12/2023)     Cyanocobalamin (VITAMIN B-12 PO) Take by mouth daily. (Patient not taking: Reported on 05/12/2023)     lamoTRIgine (LAMICTAL) 25 MG tablet Take 1 tablet (25 mg total) by mouth 2 (two) times daily. 180 tablet 1   No current facility-administered medications for this visit.     Musculoskeletal: Strength & Muscle Tone:  UTA Gait & Station:  Seated Patient leans: N/A  Psychiatric Specialty Exam: Review of Systems  Psychiatric/Behavioral:  Positive for sleep disturbance. The patient is nervous/anxious.     There were no vitals taken for this visit.There is no height or weight on file to calculate BMI.  General Appearance: Casual  Eye Contact:  Fair  Speech:  Clear and Coherent  Volume:  Normal  Mood:  Anxious  Affect:  Appropriate  Thought Process:  Goal Directed and Descriptions of Associations: Intact  Orientation:  Full (Time, Place, and Person)  Thought Content: Logical   Suicidal Thoughts:  No  Homicidal Thoughts:  No  Memory:  Immediate;   Fair Recent;  Fair Remote;   Fair  Judgement:  Fair  Insight:  Fair  Psychomotor Activity:  Normal  Concentration:  Concentration: Fair and Attention Span: Fair  Recall:  [employer] of Knowledge: Fair  Language: Fair  Akathisia:  No  Handed:  Right  AIMS (if indicated): not done  Assets:  Communication Skills Desire for Improvement Housing Social Support  ADL's:  Intact  Cognition: WNL  Sleep:  Poor   Screenings: AIMS    Flowsheet Row Office Visit from 06/17/2022 in [facility] Office Visit from 03/27/2021 in [facility]  AIMS Total Score 0 1      AUDIT    Flowsheet Row Clinical Support from 03/01/2023 in [facility]  Alcohol Use Disorder Identification Test Final Score (AUDIT) 3      GAD-7    Flowsheet Row Office Visit from 12/01/2022 in [facility] Office Visit from 10/26/2022 in [facility] Office Visit from 06/17/2022 in [facility]  Total GAD-7 Score 4 13 2       PHQ2-9    Flowsheet Row Video Visit from 05/12/2023 in [facility] Most recent reading at 05/12/2023  1:19 PM Clinical Support from 03/01/2023 in [facility] [place] Most recent reading at 03/01/2023  2:39 PM Office Visit from 03/01/2023 in [facility] [place] Most recent reading at 03/01/2023 10:17 AM Office Visit from 01/31/2023 in [facility] [place] Most recent reading at 01/31/2023  2:22 PM Office Visit from 12/01/2022 in [facility] Most recent reading at 12/01/2022 11:52 AM  PHQ-2 Total Score 0 0 0 0 0  PHQ-9 Total Score -- -- -- -- 2      Flowsheet Row Video Visit from 05/12/2023 in [facility] Admission (Discharged) from 03/09/2023 in [facility] Video Visit from 02/21/2023 in [facility]  C-SSRS RISK CATEGORY No Risk No Risk No Risk        Assessment and Plan: Julia Cooke is a 72 year old Caucasian female who has a history of bipolar disorder, currently struggling with situational stressors, sleep problems, will benefit from the following plan.  Plan Bipolar disorder type I mixed mild in remission Lamotrigine 25 mg p.o. twice daily  Insomnia-unstable Start zolpidem 2.5-5 mg p.o. nightly as needed Patient to continue sleep hygiene techniques. Reviewed [place] PMP AWARxE  Discussed referral to therapist, provided resources in the community.  Follow-up in clinic in 2 months or sooner if needed.   Collaboration of Care: Collaboration of Care: Referral or follow-up with counselor/therapist AEB encouraged to establish care with therapist.  Patient/Guardian was advised Release of Information must be obtained prior to any record release in order to collaborate their care with an outside provider. Patient/Guardian was advised if they have not already done so to contact the registration department to sign all necessary forms in order for [place] to release information regarding their care.   Consent: Patient/Guardian gives verbal consent for treatment and assignment of benefits for services provided during this visit. Patient/Guardian expressed understanding and agreed to proceed.   This note was generated in part or whole with voice recognition software. Voice recognition is usually quite accurate but there are transcription errors that can and very often do occur.  I apologize for any typographical errors that were not detected and corrected.    Jomarie Longs, MD 05/13/2023, 8:26 AM

## 2023-05-27 ENCOUNTER — Other Ambulatory Visit: Payer: Self-pay | Admitting: Internal Medicine

## 2023-05-27 DIAGNOSIS — M25562 Pain in left knee: Secondary | ICD-10-CM

## 2023-05-27 DIAGNOSIS — G8929 Other chronic pain: Secondary | ICD-10-CM

## 2023-05-27 DIAGNOSIS — I10 Essential (primary) hypertension: Secondary | ICD-10-CM

## 2023-05-27 DIAGNOSIS — K219 Gastro-esophageal reflux disease without esophagitis: Secondary | ICD-10-CM

## 2023-05-27 DIAGNOSIS — E785 Hyperlipidemia, unspecified: Secondary | ICD-10-CM

## 2023-05-31 DIAGNOSIS — M2022 Hallux rigidus, left foot: Secondary | ICD-10-CM | POA: Diagnosis not present

## 2023-06-01 ENCOUNTER — Encounter: Payer: Self-pay | Admitting: Internal Medicine

## 2023-06-01 ENCOUNTER — Ambulatory Visit (INDEPENDENT_AMBULATORY_CARE_PROVIDER_SITE_OTHER): Payer: Medicare HMO | Admitting: Internal Medicine

## 2023-06-01 VITALS — BP 128/78 | HR 82 | Temp 97.5°F | Ht 60.0 in | Wt 184.2 lb

## 2023-06-01 DIAGNOSIS — F3170 Bipolar disorder, currently in remission, most recent episode unspecified: Secondary | ICD-10-CM | POA: Diagnosis not present

## 2023-06-01 DIAGNOSIS — I10 Essential (primary) hypertension: Secondary | ICD-10-CM

## 2023-06-01 MED ORDER — TETANUS-DIPHTH-ACELL PERTUSSIS 5-2.5-18.5 LF-MCG/0.5 IM SUSY: 0.5000 mL | PREFILLED_SYRINGE | Freq: Once | INTRAMUSCULAR | 0 refills | Status: AC

## 2023-06-01 MED ORDER — AMLODIPINE BESYLATE 2.5 MG PO TABS
2.5000 mg | ORAL_TABLET | Freq: Every day | ORAL | 1 refills | Status: DC
  Filled 2023-06-24 – 2023-09-03 (×2): qty 90, 90d supply, fill #0

## 2023-06-01 NOTE — Progress Notes (Signed)
Subjective:  Patient ID: Julia Cooke, female    DOB: 23-Aug-1951  Age: 72 y.o. MRN: [id number]  CC: The primary encounter diagnosis was Bipolar disorder in partial remission, most recent episode unspecified type (HCC). A diagnosis of Essential hypertension was also pertinent to this visit.   HPI Julia Cooke presents for  Chief Complaint  Patient presents with   Medical Management of Chronic Issues    3 month follow up    1) leg pain:  b12 deficiency history  Referred to Dr Sherryll Burger in April  for EMG/[place] studies .  Did not call their office back (called 4/4) .  She decided not to go until her cardiologist ruled out a circulation problem, which he did. She has decdied that her pain is tolerable and does not want to have the eval  Had surgery on her left  great toe  in April by Ether Griffins,   which she feels was partly successful. She is learning to walk correctly again.  She has a history of hammertoe corrective surgery remotely which has resulted in a change in anatomy and persistent feeling of walking on a stone. Ether Griffins  has recommended New Balance and  specific supports for shoes   Bipolar disorder: seeing [facility] .  Mood has been anxious due to increased. Life stressors , worried about her daughters and granddaughters.  Trying to trust in God .  Michelle's new  romantic relationship and financial  irresponsibility has caused patient and husband to feel unwelcome (they live with daughter ) and they are needing to move out but all 3 are financially strapped . She also has a son with Asperger's Syndrome Arlys John  There is some marital conflict due to tension,  her husband does not appreciate the depth and intensity of her [employer]ri[employer] Knuckles faith   2  HTN:  Patient is taking her medications as prescribed and notes no adverse effects.  Home BP readings have been done about once per week and are  generally < 130/80 .  She is avoiding added salt in her diet and walking regularly about 3 times per week for exercise  .    3) HLD:  tolerating Lipitor    Lab Results  Component Value Date   VITAMINB12 375 03/01/2023    Outpatient Medications Prior to Visit  Medication Sig Dispense Refill   atorvastatin (LIPITOR) 10 MG tablet Take 1 tablet (10 mg total) by mouth daily. 90 tablet 0   gabapentin (NEURONTIN) 100 MG capsule Take 1 capsule (100 mg total) by mouth 3 (three) times daily. 90 capsule 3   irbesartan (AVAPRO) 300 MG tablet Take 1 tablet (300 mg total) by mouth daily. 90 tablet 3   lamoTRIgine (LAMICTAL) 25 MG tablet Take 1 tablet (25 mg total) by mouth 2 (two) times daily. 180 tablet 1   melatonin 5 MG TABS Take 5 mg by mouth.     metoprolol succinate (TOPROL-XL) 25 MG 24 hr tablet Take 25 mg by mouth daily.     Multiple Vitamin (MULTIVITAMIN) tablet Take 1 tablet by mouth daily.     omeprazole (PRILOSEC) 20 MG capsule TAKE 1 CAPSULE BY MOUTH DAILY 90 capsule 1   zolpidem (AMBIEN) 5 MG tablet Take 0.5-1 tablets (2.5-5 mg total) by mouth at bedtime as needed for sleep. 30 tablet 0   amLODipine (NORVASC) 2.5 MG tablet Take 1 tablet (2.5 mg total) by mouth at bedtime. 90 tablet 1   Cholecalciferol (VITAMIN D-3 PO) Take by mouth daily. (Patient  not taking: Reported on 05/12/2023)     Cyanocobalamin (VITAMIN B-12 PO) Take by mouth daily. (Patient not taking: Reported on 05/12/2023)     metoprolol succinate (TOPROL-XL) 50 MG 24 hr tablet Take 1 tablet (50 mg total) by mouth daily. Take with or immediately following a meal. (Patient not taking: Reported on 06/01/2023) 90 tablet 3   No facility-administered medications prior to visit.    Review of Systems;  Patient denies headache, fevers, malaise, unintentional weight loss, skin rash, eye pain, sinus congestion and sinus pain, sore throat, dysphagia,  hemoptysis , cough, dyspnea, wheezing, chest pain, palpitations, orthopnea, edema, abdominal pain, nausea, melena, diarrhea, constipation, flank pain, dysuria, hematuria, urinary  Frequency, nocturia, numbness,  tingling, seizures,  Focal weakness, Loss of consciousness,  Tremor, insomnia, depression, anxiety, and suicidal ideation.      Objective:  BP 128/78   Pulse 82   Temp (!) 97.5 F (36.4 C) (Oral)   Ht 5' (1.524 m)   Wt 184 lb 3.2 oz (83.6 kg)   SpO2 96%   BMI 35.97 kg/m   BP Readings from Last 3 Encounters:  06/01/23 128/78  03/09/23 (!) 124/53  03/04/23 120/72    Wt Readings from Last 3 Encounters:  06/01/23 184 lb 3.2 oz (83.6 kg)  03/09/23 174 lb (78.9 kg)  03/04/23 172 lb (78 kg)    Physical Exam Vitals reviewed.  Constitutional:      General: She is not in acute distress.    Appearance: Normal appearance. She is normal weight. She is not ill-appearing, toxic-appearing or diaphoretic.  HENT:     Head: Normocephalic.  Eyes:     General: No scleral icterus.       Right eye: No discharge.        Left eye: No discharge.     Conjunctiva/sclera: Conjunctivae normal.  Cardiovascular:     Rate and Rhythm: Normal rate and regular rhythm.     Heart sounds: Normal heart sounds.  Pulmonary:     Effort: Pulmonary effort is normal. No respiratory distress.     Breath sounds: Normal breath sounds.  Musculoskeletal:        General: Normal range of motion.  Skin:    General: Skin is warm and dry.  Neurological:     General: No focal deficit present.     Mental Status: She is alert and oriented to person, place, and time. Mental status is at baseline.  Psychiatric:        Mood and Affect: Mood normal.        Behavior: Behavior normal.        Thought Content: Thought content normal.        Judgment: Judgment normal.    Lab Results  Component Value Date   HGBA1C 5.2 03/01/2023   HGBA1C 5.0 12/01/2021   HGBA1C 4.8 06/11/2020    Lab Results  Component Value Date   CREATININE 0.81 03/01/2023   CREATININE 0.82 01/31/2023   CREATININE 0.82 12/06/2022    Lab Results  Component Value Date   WBC 5.7 12/01/2021   HGB 13.1 12/01/2021   HCT 39.3 12/01/2021   PLT  210.0 12/01/2021   GLUCOSE 93 03/01/2023   CHOL 203 (H) 12/01/2021   TRIG 83.0 12/01/2021   HDL 48.80 12/01/2021   LDLDIRECT 162.0 12/23/2016   LDLCALC 138 (H) 12/01/2021   ALT 17 03/01/2023   AST 18 03/01/2023   NA 138 03/01/2023   K 4.1 03/01/2023   CL 104 03/01/2023  CREATININE 0.81 03/01/2023   BUN 17 03/01/2023   CO2 29 03/01/2023   TSH 0.89 12/01/2021   HGBA1C 5.2 03/01/2023   MICROALBUR <0.7 03/01/2023    MM 3D SCREEN BREAST BILATERAL  Result Date: 04/06/2023 CLINICAL DATA:  Screening. EXAM: DIGITAL SCREENING BILATERAL MAMMOGRAM WITH TOMOSYNTHESIS AND CAD TECHNIQUE: Bilateral screening digital craniocaudal and mediolateral oblique mammograms were obtained. Bilateral screening digital breast tomosynthesis was performed. The images were evaluated with computer-aided detection. COMPARISON:  Previous exam(s). ACR Breast Density Category a: The breasts are almost entirely fatty. FINDINGS: There are no findings suspicious for malignancy. IMPRESSION: No mammographic evidence of malignancy. A result letter of this screening mammogram will be mailed directly to the patient. RECOMMENDATION: Screening mammogram in one year. (Code:[id number]-B-01Y) BI-RADS CATEGORY  1: Negative. Electronically Signed   By: Elberta Fortis M.D.   On: 04/06/2023 08:39    Assessment & Plan:  .Bipolar disorder in partial remission, most recent episode unspecified type [facility]) Assessment & Plan: She is having marital conflict with husband related to the intensity of her fatith,  advised to seek couples counselling through her psychiatrist    Essential hypertension Assessment & Plan: Improved on 3 drug regimen. Home readings reviewed .  Patient is taking her medications as prescribed and notes no adverse effects.  Home BP readings have been done about once per week and are  generally < 130/80 .  She is avoiding added salt in her diet and walking regularly about 3 times per week for exercise  .    Other orders -      amLODIPine Besylate; Take 1 tablet (2.5 mg total) by mouth at bedtime.  Dispense: 90 tablet; Refill: 1 -     Tetanus-Diphth-Acell Pertussis; Inject 0.5 mLs into the muscle once for 1 dose.  Dispense: 0.5 mL; Refill: 0     I provided 30 minutes of face-to-face time during this encounter reviewing patient's last visit with me, patient's  most recent visit with cardiology, ecent surgical and non surgical procedures, previous  labs and imaging studies, counseling on currently addressed issues,  and post visit ordering to diagnostics and therapeutics .   Follow-up: Return in about 3 months (around 09/01/2023).   Sherlene Shams, MD

## 2023-06-01 NOTE — Patient Instructions (Addendum)
Continue B12 and vitamin D daily   I recommend couples counselling for you and Richard.  I would start with your church     You are due for your tetanus-diptheria-pertussis vaccine   (TDaP)   You can  get it at your pharmacy  for FREE

## 2023-06-04 NOTE — Assessment & Plan Note (Signed)
Improved on 3 drug regimen. Home readings reviewed .  Patient is taking her medications as prescribed and notes no adverse effects.  Home BP readings have been done about once per week and are  generally < 130/80 .  She is avoiding added salt in her diet and walking regularly about 3 times per week for exercise  .

## 2023-06-04 NOTE — Assessment & Plan Note (Signed)
She is having marital conflict with husband related to the intensity of her fatith,  advised to seek couples counselling through her psychiatrist

## 2023-06-22 ENCOUNTER — Other Ambulatory Visit (HOSPITAL_COMMUNITY): Payer: Self-pay

## 2023-06-22 MED ORDER — METOPROLOL SUCCINATE ER 50 MG PO TB24
50.0000 mg | ORAL_TABLET | Freq: Every day | ORAL | 3 refills | Status: DC
  Filled 2023-06-24 – 2023-11-07 (×3): qty 90, 90d supply, fill #0

## 2023-06-23 ENCOUNTER — Other Ambulatory Visit: Payer: Self-pay

## 2023-06-23 ENCOUNTER — Other Ambulatory Visit (HOSPITAL_COMMUNITY): Payer: Self-pay

## 2023-06-23 MED ORDER — QUETIAPINE FUMARATE 25 MG PO TABS: 25.0000 mg | ORAL_TABLET | Freq: Every evening | ORAL | 0 refills | Status: DC

## 2023-06-23 MED ORDER — LOSARTAN POTASSIUM 100 MG PO TABS: 100.0000 mg | ORAL_TABLET | Freq: Every day | ORAL | 0 refills | Status: DC

## 2023-06-23 MED ORDER — OMEPRAZOLE 20 MG PO CPDR
20.0000 mg | DELAYED_RELEASE_CAPSULE | Freq: Every day | ORAL | 1 refills | Status: DC
  Filled 2023-06-24 – 2023-07-04 (×2): qty 90, 90d supply, fill #0

## 2023-06-23 MED ORDER — ARIPIPRAZOLE 2 MG PO TABS: 2.0000 mg | ORAL_TABLET | Freq: Every morning | ORAL | 1 refills | Status: DC

## 2023-06-24 ENCOUNTER — Other Ambulatory Visit (HOSPITAL_COMMUNITY): Payer: Self-pay

## 2023-06-24 MED FILL — Omeprazole Cap Delayed Release 20 MG: ORAL | 30 days supply | Qty: 30 | Fill #0 | Status: CN

## 2023-06-28 ENCOUNTER — Other Ambulatory Visit (HOSPITAL_COMMUNITY): Payer: Self-pay

## 2023-06-28 ENCOUNTER — Other Ambulatory Visit: Payer: Self-pay

## 2023-07-04 ENCOUNTER — Other Ambulatory Visit: Payer: Self-pay

## 2023-07-12 ENCOUNTER — Telehealth: Payer: Self-pay | Admitting: Psychiatry

## 2023-07-12 ENCOUNTER — Telehealth (INDEPENDENT_AMBULATORY_CARE_PROVIDER_SITE_OTHER): Payer: Medicare HMO | Admitting: Psychiatry

## 2023-07-12 DIAGNOSIS — F3178 Bipolar disorder, in full remission, most recent episode mixed: Secondary | ICD-10-CM

## 2023-07-12 DIAGNOSIS — G4701 Insomnia due to medical condition: Secondary | ICD-10-CM

## 2023-07-12 MED ORDER — ZOLPIDEM TARTRATE 5 MG PO TABS
2.5000 mg | ORAL_TABLET | Freq: Every evening | ORAL | 0 refills | Status: DC | PRN
Start: 2023-07-12 — End: 2023-09-08

## 2023-07-12 NOTE — Telephone Encounter (Signed)
I have sent Ambien to pharmacy. °

## 2023-07-12 NOTE — Telephone Encounter (Signed)
Patient rescheduled appointment to 07/14/23. Stated she needs a refill on Zolpidem 5mg . Please send to [facility]

## 2023-07-12 NOTE — Progress Notes (Signed)
No response to call or text or video invite  

## 2023-07-14 ENCOUNTER — Encounter: Payer: Self-pay | Admitting: Psychiatry

## 2023-07-14 ENCOUNTER — Telehealth (INDEPENDENT_AMBULATORY_CARE_PROVIDER_SITE_OTHER): Payer: Medicare HMO | Admitting: Psychiatry

## 2023-07-14 DIAGNOSIS — F3178 Bipolar disorder, in full remission, most recent episode mixed: Secondary | ICD-10-CM | POA: Diagnosis not present

## 2023-07-14 DIAGNOSIS — G4701 Insomnia due to medical condition: Secondary | ICD-10-CM | POA: Diagnosis not present

## 2023-07-14 NOTE — Progress Notes (Signed)
Virtual Visit via Video Note  I connected with Julia Cooke on 07/14/23 at  2:00 PM EDT by a video enabled telemedicine application and verified that I am speaking with the correct person using two identifiers.  Location Provider Location : [facility] Patient Location : Home  Participants: Patient , Provider    I discussed the limitations of evaluation and management by telemedicine and the availability of in person appointments. The patient expressed understanding and agreed to proceed.   I discussed the assessment and treatment plan with the patient. The patient was provided an opportunity to ask questions and all were answered. The patient agreed with the plan and demonstrated an understanding of the instructions.   The patient was advised to call back or seek an in-person evaluation if the symptoms worsen or if the condition fails to improve as anticipated.   BH MD OP Progress Note  07/14/2023 6:36 PM Julia Cooke  MRN:  [id number]  Chief Complaint:  Chief Complaint  Patient presents with   Follow-up   Depression   Manic Behavior   Medication Refill   HPI: Julia Cooke is a 72 year old Caucasian female, married, on SSI, lives in [place], has a history of bipolar disorder was evaluated by telemedicine today.  Patient today reports she is currently doing okay on the current medication regimen.  Compliant on the Lamictal.  Denies side effects.  Denies any significant depression symptoms.  Denies any mania or hypomanic symptoms.  Continues to struggle with sleep some days.  However since being on the zolpidem sleep has improved.  She is aware she needs to work on her sleep hygiene.She does get around 7 hours of sleep on average.  Patient reports her husband recently had a procedure done and is recovering.  Patient reports although anxiety provoking she managed it well.  She denies any suicidality, homicidality or perceptual disturbances.  She is planning to move to a new home  soon.  Has started packing.  Patient denies any other concerns today.  Visit Diagnosis:    ICD-10-CM   1. Bipolar disorder, in full remission, most recent episode mixed (HCC)  F31.78     2. Insomnia due to medical condition  G47.01    mood, pain      Past Psychiatric History: I have reviewed past psychiatric history from progress note on 05/02/2018.  Past trials of Abilify, Seroquel.  Past Medical History:  Past Medical History:  Diagnosis Date   Bipolar disorder (HCC)    takes Abilify   Depression    Dysrhythmia 10/2016   LBBB on ekg...nothing to compare it to.   Heart murmur    Hypertension    Nephrolithiasis 2000   s/p lithotripsy   Psychotic disorder (HCC)    mania   Wears contact lenses     Past Surgical History:  Procedure Laterality Date   ARTHRODESIS METATARSALPHALANGEAL JOINT (MTPJ) Left 03/09/2023   Procedure: ARTHRODESIS METATARSALPHALANGEAL JOINT (MTPJ);  Surgeon: Gwyneth Revels, DPM;  Location: [facility];  Service: Podiatry;  Laterality: Left;   CAPSULOTOMY METATARSOPHALANGEAL Left 03/09/2023   Procedure: CAPSULOTOMY METATARSOPHALANGEAL SECOND;  Surgeon: Gwyneth Revels, DPM;  Location: [facility];  Service: Podiatry;  Laterality: Left;   EXTRACORPOREAL SHOCK WAVE LITHOTRIPSY Left 01/06/2017   Procedure: EXTRACORPOREAL SHOCK WAVE LITHOTRIPSY (ESWL);  Surgeon: Vanna Scotland, MD;  Location: [facility];  Service: Urology;  Laterality: Left;   FLEXOR TENOTOMY  Left 03/09/2023   Procedure: FLEXOR TENOTOMY;  Surgeon: Gwyneth Revels, DPM;  [facility];  Service: Podiatry;  Laterality: Left;   LITHOTRIPSY  2000    Family Psychiatric History: I have reviewed family psychiatric history from progress note on 05/02/2018.  Family History:  Family History  Problem Relation Age of Onset   Hyperlipidemia Mother    Stroke Mother    Hypertension Mother    Hyperlipidemia Father    Hypertension Father    Cancer Sister        breast   Breast  cancer Sister 20   Fibromyalgia Sister    Hypertension Sister    Heart Problems Sister    Hypertension Sister    Heart disease Brother        myocardial infarction   Heart attack Brother    Hypertension Brother    Prostate cancer Neg Hx    Kidney cancer Neg Hx    Bladder Cancer Neg Hx     Social History: I have reviewed social history from progress note on 05/02/2018. Social History   Socioeconomic History   Marital status: Married    Spouse name: richard   Number of children: 3   Years of education: Not on file   Highest education level: Some college, no degree  Occupational History    Comment: retired  Tobacco Use   Smoking status: Never   Smokeless tobacco: Never  Vaping Use   Vaping status: Never Used  Substance and Sexual Activity   Alcohol use: No    Alcohol/week: 0.0 - 2.0 standard drinks of alcohol    Comment: 2-3 times weekly   Drug use: No   Sexual activity: Yes    Birth control/protection: Post-menopausal, None  Other Topics Concern   Not on file  Social History Narrative   Patient lives at home with her husband of 41 years. They have 3 adult children (2 boys, 1 girl). They recently moved back to [place] in January. 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 Patient has also worked as a Occupational hygienist.   Social Determinants of Health   Financial Resource Strain: High Risk (03/01/2023)   Overall Financial Resource Strain (CARDIA)    Difficulty of Paying Living Expenses: Hard  Food Insecurity: Food Insecurity Present (03/01/2023)   Hunger Vital Sign    Worried About Running Out of Food in the Last Year: Sometimes true    Ran Out of Food in the Last Year: Never true  Transportation Needs: No Transportation Needs (03/01/2023)   PRAPARE - Administrator, Civil Service (Medical): No    Lack of Transportation (Non-Medical): No  Physical Activity: Inactive (03/01/2023)   Exercise Vital Sign    Days of Exercise per Week: 0 days     Minutes of Exercise per Session: 0 min  Stress: No Stress Concern Present (03/01/2023)   [employer] of Occupational Health - Occupational Stress Questionnaire    Feeling of Stress : Not at all  Social Connections: Socially Integrated (03/01/2023)   Social Connection and Isolation Panel [NHANES]    Frequency of Communication with Friends and Family: More than three times a week    Frequency of Social Gatherings with Friends and Family: Twice a week    Attends Religious Services: More than 4 times per year    Active Member of [employer] or Organizations: Yes    Attends Engineer, structural: More than 4 times per year    Marital Status: Married    Allergies:  Allergies  Allergen Reactions  Penicillins Swelling    As a child    Metabolic Disorder Labs: Lab Results  Component Value Date   HGBA1C 5.2 03/01/2023   MPG 94 01/20/2018   Lab Results  Component Value Date   PROLACTIN 3.2 (L) 01/20/2018   Lab Results  Component Value Date   CHOL 203 (H) 12/01/2021   TRIG 83.0 12/01/2021   HDL 48.80 12/01/2021   CHOLHDL 4 12/01/2021   VLDL 16.6 12/01/2021   LDLCALC 138 (H) 12/01/2021   LDLCALC 154 (H) 04/08/2021   Lab Results  Component Value Date   TSH 0.89 12/01/2021   TSH 0.95 04/08/2021    Therapeutic Level Labs: No results found for: "LITHIUM" No results found for: "VALPROATE" Lab Results  Component Value Date   CBMZ 2.9 (L) 01/07/2014    Current Medications: Current Outpatient Medications  Medication Sig Dispense Refill   amLODipine (NORVASC) 2.5 MG tablet Take 1 tablet (2.5 mg total) by mouth at bedtime. 90 tablet 1   atorvastatin (LIPITOR) 10 MG tablet Take 1 tablet (10 mg total) by mouth daily. 90 tablet 0   Cyanocobalamin (VITAMIN B-12 PO) Take by mouth daily.     irbesartan (AVAPRO) 300 MG tablet Take 1 tablet (300 mg total) by mouth daily. 90 tablet 3   lamoTRIgine (LAMICTAL) 25 MG tablet Take 1 tablet (25 mg total) by mouth 2 (two) times daily. 180  tablet 1   metoprolol succinate (TOPROL-XL) 50 MG 24 hr tablet Take 1 tablet (50 mg total) by mouth daily with or immediately following a meal. 90 tablet 3   Multiple Vitamin (MULTIVITAMIN) tablet Take 1 tablet by mouth daily.     omeprazole (PRILOSEC) 20 MG capsule Take 1 capsule (20 mg total) by mouth daily. 90 capsule 1   zolpidem (AMBIEN) 5 MG tablet Take 0.5-1 tablets (2.5-5 mg total) by mouth at bedtime as needed for sleep. 30 tablet 0   gabapentin (NEURONTIN) 100 MG capsule Take 1 capsule (100 mg total) by mouth 3 (three) times daily. (Patient not taking: Reported on 07/14/2023) 90 capsule 3   melatonin 5 MG TABS Take 5 mg by mouth. (Patient not taking: Reported on 07/14/2023)     No current facility-administered medications for this visit.     Musculoskeletal: Strength & Muscle Tone:  UTA Gait & Station:  Seated Patient leans: N/A  Psychiatric Specialty Exam: Review of Systems  Psychiatric/Behavioral: Negative.      There were no vitals taken for this visit.There is no height or weight on file to calculate BMI.  General Appearance: Fairly Groomed  Eye Contact:  Fair  Speech:  Clear and Coherent  Volume:  Normal  Mood:  Euthymic  Affect:  Congruent  Thought Process:  Goal Directed and Descriptions of Associations: Intact  Orientation:  Full (Time, Place, and Person)  Thought Content: Logical   Suicidal Thoughts:  No  Homicidal Thoughts:  No  Memory:  Immediate;   Fair Recent;   Fair Remote;   Fair  Judgement:  Fair  Insight:  Fair  Psychomotor Activity:  Normal  Concentration:  Concentration: Fair and Attention Span: Fair  Recall:  [employer] of Knowledge: Fair  Language: Fair  Akathisia:  No  Handed:  Right  AIMS (if indicated): not done  Assets:  Communication Skills Desire for Improvement Housing Social Support  ADL's:  Intact  Cognition: WNL  Sleep:  Fair   Screenings: AIMS    Flowsheet Row Office Visit from 06/17/2022 in [facility] Office Visit from 03/27/2021 in [facility]  AIMS Total Score 0 1      AUDIT    Flowsheet Row Clinical Support from 03/01/2023 in [facility]  Alcohol Use Disorder Identification Test Final Score (AUDIT) 3      GAD-7    Flowsheet Row Office Visit from 12/01/2022 in [facility] Office Visit from 10/26/2022 in [facility] Office Visit from 06/17/2022 in [facility]  Total GAD-7 Score 4 13 2       PHQ2-9    Flowsheet Row Office Visit from 06/01/2023 in [facility] Most recent reading at 06/01/2023  9:58 AM Video Visit from 05/12/2023 in [facility] Most recent reading at 05/12/2023  1:19 PM Clinical Support from 03/01/2023 in [facility] [place] Most recent reading at 03/01/2023  2:39 PM Office Visit from 03/01/2023 in [facility] [place] Most recent reading at 03/01/2023 10:17 AM Office Visit from 01/31/2023 in [facility] Most recent reading at 01/31/2023  2:22 PM  PHQ-2 Total Score 0 0 0 0 0      Flowsheet Row Video Visit from 07/14/2023 in [facility] Video Visit from 05/12/2023 in [facility] Admission (Discharged) from 03/09/2023 in [facility]  C-SSRS RISK CATEGORY No Risk No Risk No Risk        Assessment and Plan: Julia Cooke is a 72 year old Caucasian female who has a history of bipolar disorder, currently improving on the current medication regimen.  Plan as noted below.  Plan Bipolar disorder type I mixed mild in remission Lamotrigine 25 mg p.o. twice  daily  Insomnia-improving Zolpidem 2.5-5 mg p.o. nightly as needed Patient to continue sleep hygiene techniques Reviewed [place] PMP AWARxE  Follow-up in clinic in 2 months or sooner in person.    Consent: Patient/Guardian gives verbal consent for treatment and assignment of benefits for services provided during this visit. Patient/Guardian expressed understanding and agreed to proceed.   This note was generated in part or whole with voice recognition software. Voice recognition is usually quite accurate but there are transcription errors that can and very often do occur. I apologize for any typographical errors that were not detected and corrected.    Jomarie Longs, MD 07/14/2023, 6:36 PM

## 2023-07-18 ENCOUNTER — Other Ambulatory Visit: Payer: Self-pay

## 2023-07-18 ENCOUNTER — Other Ambulatory Visit (HOSPITAL_COMMUNITY): Payer: Self-pay

## 2023-08-06 ENCOUNTER — Other Ambulatory Visit: Payer: Self-pay

## 2023-08-08 ENCOUNTER — Encounter: Payer: Self-pay | Admitting: Internal Medicine

## 2023-08-09 ENCOUNTER — Other Ambulatory Visit (HOSPITAL_COMMUNITY): Payer: Self-pay

## 2023-08-09 DIAGNOSIS — G629 Polyneuropathy, unspecified: Secondary | ICD-10-CM | POA: Diagnosis not present

## 2023-08-09 DIAGNOSIS — B351 Tinea unguium: Secondary | ICD-10-CM | POA: Diagnosis not present

## 2023-08-09 DIAGNOSIS — S90212A Contusion of left great toe with damage to nail, initial encounter: Secondary | ICD-10-CM | POA: Diagnosis not present

## 2023-08-09 NOTE — Telephone Encounter (Signed)
Spoke with pt and scheduled her for an appt tomorrow at 12pm.  ?

## 2023-08-10 ENCOUNTER — Ambulatory Visit (INDEPENDENT_AMBULATORY_CARE_PROVIDER_SITE_OTHER): Payer: Medicare HMO

## 2023-08-10 ENCOUNTER — Ambulatory Visit (INDEPENDENT_AMBULATORY_CARE_PROVIDER_SITE_OTHER): Payer: Medicare HMO | Admitting: Family

## 2023-08-10 ENCOUNTER — Encounter: Payer: Self-pay | Admitting: Family

## 2023-08-10 VITALS — BP 118/78 | HR 71 | Temp 98.1°F | Ht 60.0 in | Wt 184.0 lb

## 2023-08-10 DIAGNOSIS — R059 Cough, unspecified: Secondary | ICD-10-CM | POA: Diagnosis not present

## 2023-08-10 DIAGNOSIS — J40 Bronchitis, not specified as acute or chronic: Secondary | ICD-10-CM | POA: Insufficient documentation

## 2023-08-10 MED ORDER — BUDESONIDE-FORMOTEROL FUMARATE 80-4.5 MCG/ACT IN AERO: 2.0000 | INHALATION_SPRAY | Freq: Two times a day (BID) | RESPIRATORY_TRACT | 1 refills | Status: DC

## 2023-08-10 MED ORDER — SPACER/AERO-HOLDING CHAMBERS DEVI
1.0000 [IU] | Freq: Every day | 0 refills | Status: AC
Start: 2023-08-10 — End: ?

## 2023-08-10 MED ORDER — LORATADINE 10 MG PO TABS: 10.0000 mg | ORAL_TABLET | Freq: Every day | ORAL | 11 refills | Status: DC

## 2023-08-10 NOTE — Patient Instructions (Addendum)
Pending chest xray  Trial of Symbicort inhaler which is a low-dose steroid component to it.  Please also start Claritin which is an antihistamine.  We will consider increasing omeprazole if cough persists.   Nice to meet you !

## 2023-08-10 NOTE — Progress Notes (Signed)
Assessment & Plan:  Bronchitis Assessment & Plan: No acute respiratory distress.  She is afebrile.  Duration 4 weeks.  Reported history of asthma.  Pending chest x-ray.  Trial of Symbicort, Claritin.  Discussed that GERD is playing a role.   Orders: -     Budesonide-Formoterol Fumarate; Inhale 2 puffs into the lungs 2 (two) times daily.  Dispense: 1 each; Refill: 1 -     Spacer/Aero-Holding Chambers; 1 Units by Does not apply route daily.  Dispense: 1 Units; Refill: 0 -     DG Chest 2 View; Future -     Loratadine; Take 1 tablet (10 mg total) by mouth daily.  Dispense: 30 tablet; Refill: 11     Return precautions given.   Risks, benefits, and alternatives of the medications and treatment plan prescribed today were discussed, and patient expressed understanding.   Education regarding symptom management and diagnosis given to patient on AVS either electronically or printed.  Return if symptoms worsen or fail to improve.  Rennie Plowman, FNP  Subjective:    Patient ID: Julia Cooke, female    DOB: [birth year], 72 y.o.   MRN: [id number]  CC: Julia Cooke is a 72 y.o. female who presents today for an acute visit.    HPI: Complains of  sporadic 'deep' cough x 4 weeks.   When coughs, she gets ' muscle pain' in mid upper back. She can feel the upper back when turns to different positions as well.   Associated thick mucous with cough  She has h/o asthma however she is not on an inhaler at this time.   No fever, wheezing , sob, epigastric burning   Compliant with omeprazole 20mg  every day       She takes occasional Tylenol for neck pain.   H/o HTN Never smoker  Allergies: Penicillins Current Outpatient Medications on File Prior to Visit  Medication Sig Dispense Refill   amLODipine (NORVASC) 2.5 MG tablet Take 1 tablet (2.5 mg total) by mouth at bedtime. 90 tablet 1   atorvastatin (LIPITOR) 10 MG tablet Take 1 tablet (10 mg total) by mouth daily. 90 tablet 0    Cyanocobalamin (VITAMIN B-12 PO) Take by mouth daily.     gabapentin (NEURONTIN) 100 MG capsule Take 1 capsule (100 mg total) by mouth 3 (three) times daily. (Patient not taking: Reported on 07/14/2023) 90 capsule 3   irbesartan (AVAPRO) 300 MG tablet Take 1 tablet (300 mg total) by mouth daily. 90 tablet 3   lamoTRIgine (LAMICTAL) 25 MG tablet Take 1 tablet (25 mg total) by mouth 2 (two) times daily. 180 tablet 1   melatonin 5 MG TABS Take 5 mg by mouth. (Patient not taking: Reported on 07/14/2023)     metoprolol succinate (TOPROL-XL) 50 MG 24 hr tablet Take 1 tablet (50 mg total) by mouth daily with or immediately following a meal. 90 tablet 3   Multiple Vitamin (MULTIVITAMIN) tablet Take 1 tablet by mouth daily.     omeprazole (PRILOSEC) 20 MG capsule Take 1 capsule (20 mg total) by mouth daily. 90 capsule 1   zolpidem (AMBIEN) 5 MG tablet Take 0.5-1 tablets (2.5-5 mg total) by mouth at bedtime as needed for sleep. 30 tablet 0   No current facility-administered medications on file prior to visit.    Review of Systems    Objective:    BP 118/78   Pulse 71   Temp 98.1 F (36.7 C) (Oral)   Ht 5' (1.524 m)  Wt 184 lb (83.5 kg)   SpO2 97%   BMI 35.94 kg/m   BP Readings from Last 3 Encounters:  08/10/23 118/78  06/01/23 128/78  03/09/23 (!) 124/53   Wt Readings from Last 3 Encounters:  08/10/23 184 lb (83.5 kg)  06/01/23 184 lb 3.2 oz (83.6 kg)  03/09/23 174 lb (78.9 kg)    Physical Exam Vitals reviewed.  Constitutional:      Appearance: She is well-developed.  HENT:     Head: Normocephalic and atraumatic.     Right Ear: Hearing, tympanic membrane, ear canal and external ear normal. No decreased hearing noted. No drainage, swelling or tenderness. No middle ear effusion. No foreign body. Tympanic membrane is not erythematous or bulging.     Left Ear: Hearing, tympanic membrane, ear canal and external ear normal. No decreased hearing noted. No drainage, swelling or tenderness.   No middle ear effusion. No foreign body. Tympanic membrane is not erythematous or bulging.     Nose: Nose normal. No rhinorrhea.     Right Sinus: No maxillary sinus tenderness or frontal sinus tenderness.     Left Sinus: No maxillary sinus tenderness or frontal sinus tenderness.     Mouth/Throat:     Pharynx: Uvula midline. No oropharyngeal exudate or posterior oropharyngeal erythema.     Tonsils: No tonsillar abscesses.  Eyes:     Conjunctiva/sclera: Conjunctivae normal.  Cardiovascular:     Rate and Rhythm: Normal rate and regular rhythm.     Pulses: Normal pulses.     Heart sounds: Normal heart sounds.  Pulmonary:     Effort: Pulmonary effort is normal.     Breath sounds: Normal breath sounds. No wheezing, rhonchi or rales.     Comments: No back pain with deep inspiration Musculoskeletal:     Cervical back: No swelling, edema, erythema, tenderness, bony tenderness or crepitus. No pain with movement.  Lymphadenopathy:     Head:     Right side of head: No submental, submandibular, tonsillar, preauricular, posterior auricular or occipital adenopathy.     Left side of head: No submental, submandibular, tonsillar, preauricular, posterior auricular or occipital adenopathy.     Cervical: No cervical adenopathy.  Skin:    General: Skin is warm and dry.  Neurological:     Mental Status: She is alert.  Psychiatric:        Speech: Speech normal.        Behavior: Behavior normal.        Thought Content: Thought content normal.

## 2023-08-10 NOTE — Assessment & Plan Note (Addendum)
No acute respiratory distress.  She is afebrile.  Duration 4 weeks.  Reported history of asthma.  Pending chest x-ray.  Trial of Symbicort, Claritin.  Discussed that GERD is playing a role.

## 2023-08-16 ENCOUNTER — Encounter: Payer: Self-pay | Admitting: Family

## 2023-08-23 DIAGNOSIS — G629 Polyneuropathy, unspecified: Secondary | ICD-10-CM | POA: Diagnosis not present

## 2023-08-23 DIAGNOSIS — S90212A Contusion of left great toe with damage to nail, initial encounter: Secondary | ICD-10-CM | POA: Diagnosis not present

## 2023-09-01 ENCOUNTER — Encounter: Payer: Self-pay | Admitting: Internal Medicine

## 2023-09-01 ENCOUNTER — Ambulatory Visit: Payer: Medicare HMO | Admitting: Internal Medicine

## 2023-09-01 VITALS — BP 130/76 | HR 82 | Ht 60.0 in | Wt 183.4 lb

## 2023-09-01 DIAGNOSIS — M130 Polyarthritis, unspecified: Secondary | ICD-10-CM

## 2023-09-01 DIAGNOSIS — M25511 Pain in right shoulder: Secondary | ICD-10-CM

## 2023-09-01 DIAGNOSIS — R053 Chronic cough: Secondary | ICD-10-CM

## 2023-09-01 DIAGNOSIS — M19011 Primary osteoarthritis, right shoulder: Secondary | ICD-10-CM | POA: Diagnosis not present

## 2023-09-01 DIAGNOSIS — I10 Essential (primary) hypertension: Secondary | ICD-10-CM

## 2023-09-01 DIAGNOSIS — U099 Post covid-19 condition, unspecified: Secondary | ICD-10-CM

## 2023-09-01 DIAGNOSIS — M542 Cervicalgia: Secondary | ICD-10-CM | POA: Diagnosis not present

## 2023-09-01 DIAGNOSIS — M25561 Pain in right knee: Secondary | ICD-10-CM

## 2023-09-01 DIAGNOSIS — M25562 Pain in left knee: Secondary | ICD-10-CM

## 2023-09-01 DIAGNOSIS — G8929 Other chronic pain: Secondary | ICD-10-CM

## 2023-09-01 LAB — COMPREHENSIVE METABOLIC PANEL
ALT: 19 U/L (ref 0–35)
AST: 19 U/L (ref 0–37)
Albumin: 4.2 g/dL (ref 3.5–5.2)
Alkaline Phosphatase: 79 U/L (ref 39–117)
BUN: 23 mg/dL (ref 6–23)
CO2: 28 meq/L (ref 19–32)
Calcium: 9.5 mg/dL (ref 8.4–10.5)
Chloride: 104 meq/L (ref 96–112)
Creatinine, Ser: 0.8 mg/dL (ref 0.40–1.20)
GFR: 73.97 mL/min (ref >60.00)
Glucose, Bld: 112 mg/dL — ABNORMAL HIGH (ref 70–99)
Potassium: 3.7 meq/L (ref 3.5–5.1)
Sodium: 137 meq/L (ref 135–145)
Total Bilirubin: 1.1 mg/dL (ref 0.2–1.2)
Total Protein: 7.1 g/dL (ref 6.0–8.3)

## 2023-09-01 LAB — CBC WITH DIFFERENTIAL/PLATELET
Basophils Absolute: 0.1 10*3/uL (ref 0.0–0.1)
Basophils Relative: 2.2 % (ref 0.0–3.0)
Eosinophils Absolute: 0.2 10*3/uL (ref 0.0–0.7)
Eosinophils Relative: 3.3 % (ref 0.0–5.0)
HCT: 40.9 % (ref 36.0–46.0)
Hemoglobin: 13.8 g/dL (ref 12.0–15.0)
Lymphocytes Relative: 30.7 % (ref 12.0–46.0)
Lymphs Abs: 1.8 10*3/uL (ref 0.7–4.0)
MCHC: 33.7 g/dL (ref 30.0–36.0)
MCV: 90.1 fL (ref 78.0–100.0)
Monocytes Absolute: 0.5 10*3/uL (ref 0.1–1.0)
Monocytes Relative: 8.5 % (ref 3.0–12.0)
Neutro Abs: 3.3 10*3/uL (ref 1.4–7.7)
Neutrophils Relative %: 55.3 % (ref 43.0–77.0)
Platelets: 255 10*3/uL (ref 150.0–400.0)
RBC: 4.54 Mil/uL (ref 3.87–5.11)
RDW: 12.6 % (ref 11.5–15.5)
WBC: 6 10*3/uL (ref 4.0–10.5)

## 2023-09-01 LAB — LIPID PANEL
Cholesterol: 151 mg/dL (ref 0–200)
HDL: 48.5 mg/dL (ref >39.00)
LDL Cholesterol: 76 mg/dL (ref 0–99)
NonHDL: 102.23
Total CHOL/HDL Ratio: 3
Triglycerides: 129 mg/dL (ref 0.0–149.0)
VLDL: 25.8 mg/dL (ref 0.0–40.0)

## 2023-09-01 LAB — SEDIMENTATION RATE: Sed Rate: 12 mm/h (ref 0–30)

## 2023-09-01 MED ORDER — BENZONATATE 200 MG PO CAPS: 200.0000 mg | ORAL_CAPSULE | Freq: Three times a day (TID) | ORAL | 2 refills | Status: DC | PRN

## 2023-09-01 NOTE — Progress Notes (Signed)
Subjective:  Patient ID: Julia Cooke, female    DOB: February 16, 1951  Age: 72 y.o. MRN: [id number]  CC: The primary encounter diagnosis was Essential hypertension. Diagnoses of Chronic cough, Chronic right shoulder pain, Cervicalgia, Polyarthritis, Chronic pain of both knees, Polyarthritis of shoulder, and Post-COVID chronic cough were also pertinent to this visit.   HPI Julia Cooke presents for No chief complaint on file.  1) CHRONIC COUGH:  never smoker ,  seen Sept 11 by Claris Che for 2 year history of non productive cough following a COVID infection . Chest x ray normal .  Symbicort,  claritin added.  Advised to continue omeprazole to treat any component of GERD .  Has not been using symbicort inhaler (" I can't use it because I cough right after using it).   Cough occurs throughout the day and also at night.  No prior PFTS, but thinks she had spirometry over 10 yrs ago and told she had asthma.   Denies dyspnea,  but does not exercise due to chronic left foot pain since her surgery on great toe   2) body aches.  "Everything has been hurting  for  the past 3 months.  Sharp pains in joints shoulders,  neck  and lower back worse in the morning ,.  Gets tingling and numbness n her right arm during sleep . Occasionally while driving . Trouble opening jars .  Pain  improves with activity.  No hand pain   leg goes numb after sitting on toilet.  BIlateral DJD of knees ,  has been deferring surgery on the left knee  " Outpatient Medications Prior to Visit  Medication Sig Dispense Refill   amLODipine (NORVASC) 2.5 MG tablet Take 1 tablet (2.5 mg total) by mouth at bedtime. 90 tablet 1   atorvastatin (LIPITOR) 10 MG tablet Take 1 tablet (10 mg total) by mouth daily. 90 tablet 0   budesonide-formoterol (SYMBICORT) 80-4.5 MCG/ACT inhaler Inhale 2 puffs into the lungs 2 (two) times daily. 1 each 1   Cyanocobalamin (VITAMIN B-12 PO) Take by mouth daily.     gabapentin (NEURONTIN) 100 MG capsule Take 1  capsule (100 mg total) by mouth 3 (three) times daily. 90 capsule 3   irbesartan (AVAPRO) 300 MG tablet Take 1 tablet (300 mg total) by mouth daily. 90 tablet 3   lamoTRIgine (LAMICTAL) 25 MG tablet Take 1 tablet (25 mg total) by mouth 2 (two) times daily. 180 tablet 1   loratadine (CLARITIN) 10 MG tablet Take 1 tablet (10 mg total) by mouth daily. 30 tablet 11   melatonin 5 MG TABS Take 5 mg by mouth.     metoprolol succinate (TOPROL-XL) 50 MG 24 hr tablet Take 1 tablet (50 mg total) by mouth daily with or immediately following a meal. 90 tablet 3   Multiple Vitamin (MULTIVITAMIN) tablet Take 1 tablet by mouth daily.     omeprazole (PRILOSEC) 20 MG capsule Take 1 capsule (20 mg total) by mouth daily. 90 capsule 1   Spacer/Aero-Holding Chambers DEVI 1 Units by Does not apply route daily. 1 Units 0   zolpidem (AMBIEN) 5 MG tablet Take 0.5-1 tablets (2.5-5 mg total) by mouth at bedtime as needed for sleep. 30 tablet 0   No facility-administered medications prior to visit.    Review of Systems;  Patient denies headache, fevers, malaise, unintentional weight loss, skin rash, eye pain, sinus congestion and sinus pain, sore throat, dysphagia,  hemoptysis , cough, dyspnea, wheezing, chest pain, palpitations,  orthopnea, edema, abdominal pain, nausea, melena, diarrhea, constipation, flank pain, dysuria, hematuria, urinary  Frequency, nocturia, numbness, tingling, seizures,  Focal weakness, Loss of consciousness,  Tremor, insomnia, depression, anxiety, and suicidal ideation.      Objective:  BP 130/76   Pulse 82   Ht 5' (1.524 m)   Wt 183 lb 6.4 oz (83.2 kg)   SpO2 95%   BMI 35.82 kg/m   BP Readings from Last 3 Encounters:  09/01/23 130/76  08/10/23 118/78  06/01/23 128/78    Wt Readings from Last 3 Encounters:  09/01/23 183 lb 6.4 oz (83.2 kg)  08/10/23 184 lb (83.5 kg)  06/01/23 184 lb 3.2 oz (83.6 kg)    Physical Exam Vitals reviewed.  Constitutional:      General: She is not in  acute distress.    Appearance: Normal appearance. She is normal weight. She is not ill-appearing, toxic-appearing or diaphoretic.  HENT:     Head: Normocephalic.  Eyes:     General: No scleral icterus.       Right eye: No discharge.        Left eye: No discharge.     Conjunctiva/sclera: Conjunctivae normal.  Cardiovascular:     Rate and Rhythm: Normal rate and regular rhythm.     Heart sounds: Normal heart sounds.  Pulmonary:     Effort: Pulmonary effort is normal. No respiratory distress.     Breath sounds: Normal breath sounds.  Musculoskeletal:        General: Normal range of motion.     Cervical back: No tenderness.  Skin:    General: Skin is warm and dry.  Neurological:     General: No focal deficit present.     Mental Status: She is alert and oriented to person, place, and time. Mental status is at baseline.  Psychiatric:        Mood and Affect: Mood normal.        Behavior: Behavior normal.        Thought Content: Thought content normal.        Judgment: Judgment normal.    Lab Results  Component Value Date   HGBA1C 5.2 03/01/2023   HGBA1C 5.0 12/01/2021   HGBA1C 4.8 06/11/2020    Lab Results  Component Value Date   CREATININE 0.80 09/01/2023   CREATININE 0.81 03/01/2023   CREATININE 0.82 01/31/2023    Lab Results  Component Value Date   WBC 6.0 09/01/2023   HGB 13.8 09/01/2023   HCT 40.9 09/01/2023   PLT 255.0 09/01/2023   GLUCOSE 112 (H) 09/01/2023   CHOL 151 09/01/2023   TRIG 129.0 09/01/2023   HDL 48.50 09/01/2023   LDLDIRECT 162.0 12/23/2016   LDLCALC 76 09/01/2023   ALT 19 09/01/2023   AST 19 09/01/2023   NA 137 09/01/2023   K 3.7 09/01/2023   CL 104 09/01/2023   CREATININE 0.80 09/01/2023   BUN 23 09/01/2023   CO2 28 09/01/2023   TSH 0.89 12/01/2021   HGBA1C 5.2 03/01/2023   MICROALBUR <0.7 03/01/2023    MM 3D SCREEN BREAST BILATERAL  Result Date: 04/06/2023 CLINICAL DATA:  Screening. EXAM: DIGITAL SCREENING BILATERAL MAMMOGRAM WITH  TOMOSYNTHESIS AND CAD TECHNIQUE: Bilateral screening digital craniocaudal and mediolateral oblique mammograms were obtained. Bilateral screening digital breast tomosynthesis was performed. The images were evaluated with computer-aided detection. COMPARISON:  Previous exam(s). ACR Breast Density Category a: The breasts are almost entirely fatty. FINDINGS: There are no findings suspicious for malignancy. IMPRESSION: No mammographic  evidence of malignancy. A result letter of this screening mammogram will be mailed directly to the patient. RECOMMENDATION: Screening mammogram in one year. (Code:SM-B-01Y) BI-RADS CATEGORY  1: Negative. Electronically Signed   By: Elberta Fortis M.D.   On: 04/06/2023 08:39    Assessment & Plan:  .Essential hypertension -     Comprehensive metabolic panel -     Lipid panel  Chronic cough -     Pulmonary Function Test [facility] Only; Future -     Ambulatory referral to Pulmonology -     CBC with Differential/Platelet  Chronic right shoulder pain  Cervicalgia -     DG Shoulder Right; Future -     DG Cervical Spine Complete; Future  Polyarthritis -     Sedimentation rate  Chronic pain of both knees Assessment & Plan: She has fBEEN DEFERING SURGERY .  Advised her to lose weight, continue meloxicam , tylenol and omeprazole    Polyarthritis of shoulder Assessment & Plan: Checking ESR and plain films of shoulder and cervical spine   Lab Results  Component Value Date   ESRSEDRATE 12 09/01/2023      Post-COVID chronic cough Assessment & Plan: Chest x ray reviewed from September and normal. Continue cough suppression .  Referring for pulmonary function tests and pulmonary consult   Other orders -     Benzonatate; Take 1 capsule (200 mg total) by mouth 3 (three) times daily as needed for cough.  Dispense: 60 capsule; Refill: 2     I provided 30 minutes of face-to-face time during this encounter reviewing patient's last visit with me,  recent surgical and non  surgical procedures, previous  labs and imaging studies, counseling on currently addressed issues,  and post visit ordering to diagnostics and therapeutics .   Follow-up: No follow-ups on file.   Sherlene Shams, MD

## 2023-09-01 NOTE — Patient Instructions (Signed)
1) You are OVERDUE for your tetanus-diptheria-pertussis vaccine   (TDaP)   Please get this done at your pharmacy l;  it will be PAID FOR MY MEDICARE ONLY AT YOUR PHARMACY  2)  COUGH SUPPRESSANT PRESCRIBED.  PULMONARY FUNCTION TESTS AND PULMONARY REFERRAL IN PROGRESS  3)  LABS TODAY   4) X RAYS TO BE DONE AT [facility]

## 2023-09-01 NOTE — Assessment & Plan Note (Signed)
She has fBEEN DEFERING SURGERY .  Advised her to lose weight, continue meloxicam , tylenol and omeprazole

## 2023-09-02 ENCOUNTER — Encounter: Payer: Self-pay | Admitting: Pulmonary Disease

## 2023-09-03 ENCOUNTER — Other Ambulatory Visit (HOSPITAL_COMMUNITY): Payer: Self-pay

## 2023-09-03 DIAGNOSIS — M130 Polyarthritis, unspecified: Secondary | ICD-10-CM | POA: Insufficient documentation

## 2023-09-03 NOTE — Assessment & Plan Note (Signed)
Checking ESR and plain films of shoulder and cervical spine   Lab Results  Component Value Date   ESRSEDRATE 12 09/01/2023

## 2023-09-03 NOTE — Assessment & Plan Note (Addendum)
Chest x ray reviewed from September and normal. Continue cough suppression .  Referring for pulmonary function tests and pulmonary consult

## 2023-09-05 ENCOUNTER — Other Ambulatory Visit: Payer: Self-pay

## 2023-09-07 ENCOUNTER — Other Ambulatory Visit: Payer: Self-pay

## 2023-09-08 ENCOUNTER — Ambulatory Visit: Payer: Medicare HMO | Admitting: Psychiatry

## 2023-09-08 ENCOUNTER — Encounter: Payer: Self-pay | Admitting: Psychiatry

## 2023-09-08 ENCOUNTER — Other Ambulatory Visit: Payer: Self-pay

## 2023-09-08 ENCOUNTER — Other Ambulatory Visit (HOSPITAL_COMMUNITY): Payer: Self-pay

## 2023-09-08 VITALS — BP 128/88 | HR 94 | Temp 98.7°F | Ht 60.0 in | Wt 182.0 lb

## 2023-09-08 DIAGNOSIS — F3178 Bipolar disorder, in full remission, most recent episode mixed: Secondary | ICD-10-CM

## 2023-09-08 DIAGNOSIS — G4701 Insomnia due to medical condition: Secondary | ICD-10-CM

## 2023-09-08 DIAGNOSIS — F419 Anxiety disorder, unspecified: Secondary | ICD-10-CM

## 2023-09-08 MED ORDER — ZOLPIDEM TARTRATE 5 MG PO TABS
2.5000 mg | ORAL_TABLET | Freq: Every evening | ORAL | 3 refills | Status: DC | PRN
Start: 2023-09-08 — End: 2024-04-04
  Filled 2023-09-08 (×2): qty 30, 30d supply, fill #0
  Filled 2023-11-07: qty 30, 30d supply, fill #1
  Filled 2023-12-13: qty 30, 30d supply, fill #2
  Filled 2024-01-30: qty 30, 30d supply, fill #3

## 2023-09-08 MED ORDER — DULOXETINE HCL 30 MG PO CPEP
30.0000 mg | ORAL_CAPSULE | Freq: Every day | ORAL | 1 refills | Status: DC
Start: 2023-09-08 — End: 2024-01-30
  Filled 2023-09-08 (×2): qty 30, 30d supply, fill #0
  Filled 2023-12-13: qty 30, 30d supply, fill #1

## 2023-09-08 NOTE — Progress Notes (Signed)
[facility] MD OP Progress Note  09/08/2023 1:00 PM Julia Cooke  MRN:  [id number]  Chief Complaint:  Chief Complaint  Patient presents with   Follow-up   Depression   Anxiety   Medication Refill   HPI: Julia Cooke is a 72 year old Caucasian female, married, on SSI, lives in [place], has a history of bipolar disorder insomnia was evaluated in office today.  Patient today reports she has noticed recent anxiety mostly related to her situational stressors.  She does have financial problems.  She also worries about her daughter, granddaughter who are currently going through problems.  She reports she usually prays about them and that helps her.  However she does have racing thoughts about it at times.  Patient reports sleep as restless on and off.  It is mostly because of her pain.  She reports she has generalized aches and pains all over her body.  She also struggles with fatigue during the day likely due to pain and lack of sleep.  When she takes the zolpidem she gets around 5 hours of sleep however that does not happen every night.  Patient denies any mania or hypomanic symptoms.  Denies any sadness or hopelessness.  She denies any suicidality, homicidality or perceptual disturbances.  Patient is currently taking the Lamictal 25 mg once a day dosage only.  She reports she did not follow the instructions however is willing to increase the dosage.  She does have gabapentin prescribed by primary care provider for pain however has not been taking the right dosage either.  Patient denies any side effects to medications.  Patient agreeable to trial of Cymbalta to target her anxiety, pain.  Denies any other concerns today.  Visit Diagnosis:    ICD-10-CM   1. Bipolar disorder, in full remission, most recent episode mixed (HCC)  F31.78     2. Anxiety disorder, unspecified type  F41.9 DULoxetine (CYMBALTA) 30 MG capsule    3. Insomnia due to medical condition  G47.01 zolpidem (AMBIEN) 5 MG  tablet   Mood , pain      Past Psychiatric History: I have reviewed past psychiatric history from progress note on 05/02/2018.  Past trials of Abilify, Seroquel.  Past Medical History:  Past Medical History:  Diagnosis Date   Bipolar disorder (HCC)    takes Abilify   Depression    Dysrhythmia 10/2016   LBBB on ekg...nothing to compare it to.   Heart murmur    Hypertension    Nephrolithiasis 2000   s/p lithotripsy   Psychotic disorder (HCC)    mania   Wears contact lenses     Past Surgical History:  Procedure Laterality Date   ARTHRODESIS METATARSALPHALANGEAL JOINT (MTPJ) Left 03/09/2023   Procedure: ARTHRODESIS METATARSALPHALANGEAL JOINT (MTPJ);  Surgeon: Gwyneth Revels, DPM;  Location: [facility];  Service: Podiatry;  Laterality: Left;   CAPSULOTOMY METATARSOPHALANGEAL Left 03/09/2023   Procedure: CAPSULOTOMY METATARSOPHALANGEAL SECOND;  Surgeon: Gwyneth Revels, DPM;  Location: [facility];  Service: Podiatry;  Laterality: Left;   EXTRACORPOREAL SHOCK WAVE LITHOTRIPSY Left 01/06/2017   Procedure: EXTRACORPOREAL SHOCK WAVE LITHOTRIPSY (ESWL);  Surgeon: Vanna Scotland, MD;  Location: [facility];  Service: Urology;  Laterality: Left;   FLEXOR TENOTOMY  Left 03/09/2023   Procedure: FLEXOR TENOTOMY;  Surgeon: Gwyneth Revels, DPM;  Location: [facility];  Service: Podiatry;  Laterality: Left;   LITHOTRIPSY  2000    Family Psychiatric History: I have reviewed family psychiatric history from progress note on 05/02/2018.  Family  History:  Family History  Problem Relation Age of Onset   Hyperlipidemia Mother    Stroke Mother    Hypertension Mother    Hyperlipidemia Father    Hypertension Father    Cancer Sister        breast   Breast cancer Sister 37   Fibromyalgia Sister    Hypertension Sister    Heart Problems Sister    Hypertension Sister    Heart disease Brother        myocardial infarction   Heart attack Brother    Hypertension Brother    Prostate  cancer Neg Hx    Kidney cancer Neg Hx    Bladder Cancer Neg Hx     Social History: I have reviewed social history from progress note on 05/02/2018. Social History   Socioeconomic History   Marital status: Married    Spouse name: richard   Number of children: 3   Years of education: Not on file   Highest education level: Some college, no degree  Occupational History    Comment: retired  Tobacco Use   Smoking status: Never   Smokeless tobacco: Never  Vaping Use   Vaping status: Never Used  Substance and Sexual Activity   Alcohol use: No    Alcohol/week: 0.0 - 2.0 standard drinks of alcohol    Comment: 2-3 times weekly   Drug use: No   Sexual activity: Yes    Birth control/protection: Post-menopausal, None  Other Topics Concern   Not on file  Social History Narrative   Patient lives at home with her husband of 41 years. They have 3 adult children (2 boys, 1 girl). They recently moved back to  in January. 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 Patient has also worked as a Occupational hygienist.   Social Determinants of Health   Financial Resource Strain: High Risk (03/01/2023)   Overall Financial Resource Strain (CARDIA)    Difficulty of Paying Living Expenses: Hard  Food Insecurity: Food Insecurity Present (03/01/2023)   Hunger Vital Sign    Worried About Running Out of Food in the Last Year: Sometimes true    Ran Out of Food in the Last Year: Never true  Transportation Needs: No Transportation Needs (03/01/2023)   PRAPARE - Administrator, Civil Service (Medical): No    Lack of Transportation (Non-Medical): No  Physical Activity: Inactive (03/01/2023)   Exercise Vital Sign    Days of Exercise per Week: 0 days    Minutes of Exercise per Session: 0 min  Stress: No Stress Concern Present (03/01/2023)   [employer] of Occupational Health - Occupational Stress Questionnaire    Feeling of Stress : Not at all  Social Connections: Socially  Integrated (03/01/2023)   Social Connection and Isolation Panel [NHANES]    Frequency of Communication with Friends and Family: More than three times a week    Frequency of Social Gatherings with Friends and Family: Twice a week    Attends Religious Services: More than 4 times per year    Active Member of [employer] or Organizations: Yes    Attends Engineer, structural: More than 4 times per year    Marital Status: Married    Allergies:  Allergies  Allergen Reactions   Penicillins Swelling    As a child    Metabolic Disorder Labs: Lab Results  Component Value Date   HGBA1C 5.2 03/01/2023   MPG 94 01/20/2018  Lab Results  Component Value Date   PROLACTIN 3.2 (L) 01/20/2018   Lab Results  Component Value Date   CHOL 151 09/01/2023   TRIG 129.0 09/01/2023   HDL 48.50 09/01/2023   CHOLHDL 3 09/01/2023   VLDL 25.8 09/01/2023   LDLCALC 76 09/01/2023   LDLCALC 138 (H) 12/01/2021   Lab Results  Component Value Date   TSH 0.89 12/01/2021   TSH 0.95 04/08/2021    Therapeutic Level Labs: No results found for: "LITHIUM" No results found for: "VALPROATE" Lab Results  Component Value Date   CBMZ 2.9 (L) 01/07/2014    Current Medications: Current Outpatient Medications  Medication Sig Dispense Refill   amLODipine (NORVASC) 2.5 MG tablet Take 1 tablet (2.5 mg total) by mouth at bedtime. 90 tablet 1   atorvastatin (LIPITOR) 10 MG tablet Take 1 tablet (10 mg total) by mouth daily. 90 tablet 0   benzonatate (TESSALON) 200 MG capsule Take 1 capsule (200 mg total) by mouth 3 (three) times daily as needed for cough. 60 capsule 2   budesonide-formoterol (SYMBICORT) 80-4.5 MCG/ACT inhaler Inhale 2 puffs into the lungs 2 (two) times daily. 1 each 1   Cyanocobalamin (VITAMIN B-12 PO) Take by mouth daily.     DULoxetine (CYMBALTA) 30 MG capsule Take 1 capsule (30 mg total) by mouth daily. 30 capsule 1   gabapentin (NEURONTIN) 100 MG capsule Take 1 capsule (100 mg total) by mouth 3  (three) times daily. 90 capsule 3   irbesartan (AVAPRO) 300 MG tablet Take 1 tablet (300 mg total) by mouth daily. 90 tablet 3   lamoTRIgine (LAMICTAL) 25 MG tablet Take 1 tablet (25 mg total) by mouth 2 (two) times daily. 180 tablet 1   loratadine (CLARITIN) 10 MG tablet Take 1 tablet (10 mg total) by mouth daily. 30 tablet 11   melatonin 5 MG TABS Take 5 mg by mouth.     metoprolol succinate (TOPROL-XL) 50 MG 24 hr tablet Take 1 tablet (50 mg total) by mouth daily with or immediately following a meal. 90 tablet 3   Multiple Vitamin (MULTIVITAMIN) tablet Take 1 tablet by mouth daily.     omeprazole (PRILOSEC) 20 MG capsule Take 1 capsule (20 mg total) by mouth daily. 90 capsule 1   Spacer/Aero-Holding Chambers DEVI 1 Units by Does not apply route daily. 1 Units 0   zolpidem (AMBIEN) 5 MG tablet Take 0.5-1 tablets (2.5-5 mg total) by mouth at bedtime as needed for sleep. 30 tablet 3   No current facility-administered medications for this visit.     Musculoskeletal: Strength & Muscle Tone: within normal limits Gait & Station: normal Patient leans: N/A  Psychiatric Specialty Exam: Review of Systems  Psychiatric/Behavioral:  Positive for sleep disturbance. The patient is nervous/anxious.     Blood pressure 128/88, pulse 94, temperature 98.7 F (37.1 C), temperature source Temporal, height 5' (1.524 m), weight 182 lb (82.6 kg), SpO2 94%.Body mass index is 35.54 kg/m.  General Appearance: Fairly Groomed  Eye Contact:  Fair  Speech:  Clear and Coherent  Volume:  Normal  Mood:  Anxious  Affect:  Congruent  Thought Process:  Goal Directed and Descriptions of Associations: Intact  Orientation:  Full (Time, Place, and Person)  Thought Content: Logical   Suicidal Thoughts:  No  Homicidal Thoughts:  No  Memory:  Immediate;   Fair Recent;   Fair Remote;   Fair  Judgement:  Fair  Insight:  Fair  Psychomotor Activity:  Normal  Concentration:  Concentration: Fair and Attention Span: Fair   Recall:  [employer] of Knowledge: Fair  Language: Fair  Akathisia:  No  Handed:  Right  AIMS (if indicated): not done  Assets:  Communication Skills Desire for Improvement Housing Intimacy Social Support Transportation  ADL's:  Intact  Cognition: WNL  Sleep:   varies   Screenings: Geneticist, molecular Office Visit from 06/17/2022 in [facility] Office Visit from 03/27/2021 in [facility]  AIMS Total Score 0 1      AUDIT    Flowsheet Row Clinical Support from 03/01/2023 in [facility] [employer]  Alcohol Use Disorder Identification Test Final Score (AUDIT) 3      GAD-7    Flowsheet Row Office Visit from 09/08/2023 in [facility] Office Visit from 12/01/2022 in [facility] Office Visit from 10/26/2022 in [facility] Office Visit from 06/17/2022 in [facility]  Total GAD-7 Score 6 4 13 2       [id number]    Flowsheet Row Office Visit from 09/08/2023 in [facility] Office Visit from 08/10/2023 in [facility] [employer] Visit from 06/01/2023 in [facility] [employer] Video Visit from 05/12/2023 in [facility] Clinical Support from 03/01/2023 in [facility] Total Score 1 0 0 0 0      Flowsheet Row Office Visit from 09/08/2023 in [facility] Video Visit from 07/14/2023 in [facility] Video Visit from 05/12/2023 in [facility]  C-SSRS RISK CATEGORY No Risk No Risk No Risk        Assessment and Plan: Jasnoor Trussell is a 72 year old Caucasian female who has a history of bipolar disorder, currently with anxiety, sleep problems, generalized pain, chronic, will benefit from medication readjustment, psychotherapy sessions.  Plan as noted below.  Plan Bipolar disorder type I mixed mild in remission Lamotrigine 25 mg p.o. twice daily.  Patient has been taking it only once a day and agrees to increase the dosage.  Insomnia-unstable Patient currently has gabapentin available, advised to take gabapentin at bedtime, current dosage of 300 mg. Continue Ambien 2.5-5 mg p.o. nightly as needed Continue sleep hygiene techniques Reviewed [place] PMP AWARxE  Anxiety disorder unspecified-unstable Start Cymbalta 30 mg p.o. daily Patient will benefit from psychotherapy sessions however is interested in finding a Librarian, academic.  Patient to reach out to her health insurance plan. Provided medication education.  Follow-up in clinic in 4 weeks or sooner if needed.   Collaboration of Care: Collaboration of Care: Referral or follow-up with counselor/therapist AEB patient encouraged to establish care with therapist.  Patient also encouraged to follow up with primary care provider for her generalized body pain.  Patient/Guardian was advised Release of Information must be obtained prior to any record release in order to collaborate their care with an outside provider. Patient/Guardian was advised if they have not already done so to contact the registration department to sign all necessary forms in order for [place] to release information regarding their care.   Consent: Patient/Guardian gives verbal consent for treatment and assignment of benefits for services provided during this visit. Patient/Guardian expressed understanding and agreed to proceed.   This  note was generated in part or whole with voice recognition software. Voice recognition is usually quite accurate but there are transcription errors that can and very  often do occur. I apologize for any typographical errors that were not detected and corrected.    Jomarie Longs, MD 09/09/2023, 1:40 PM

## 2023-09-08 NOTE — Patient Instructions (Signed)
[email or web address]  [email or web address]   Duloxetine Delayed-Release Capsules What is this medication? DULOXETINE (doo LOX e teen) treats depression, anxiety, fibromyalgia, and certain types of chronic pain such as nerve, bone, or joint pain. It increases the amount of serotonin and norepinephrine in the brain, hormones that help regulate mood and pain. It belongs to a group of medications called SNRIs. This medicine may be used for other purposes; ask your health care provider or pharmacist if you have questions. COMMON BRAND NAME(S): Cymbalta, Drizalma, Irenka What should I tell my care team before I take this medication? They need to know if you have any of these conditions: Bipolar disorder Glaucoma High blood pressure Kidney disease Liver disease Seizures Suicidal thoughts, plans, or attempt by you or a family member Take medications that treat or prevent blood clots Taken an MAOI, such as Carbex, Eldepryl, Marplan, Nardil, or Parnate in the last 14 days Trouble passing urine An unusual reaction to duloxetine, other medications, foods, dyes, or preservatives Pregnant or trying to get pregnant Breastfeeding How should I use this medication? Take this medication by mouth with water. Take it as directed on the prescription label at the same time every day. Do not cut, crush, or chew this medication. Swallow the capsules whole. Some capsules may be opened and sprinkled on applesauce. Check with your care team or pharmacist if you are not sure. You can take this medication with or without food. Do not take your medication more often than directed. Do not stop taking this medication suddenly except upon the advice of your care team. Stopping this medication too quickly may cause serious side effects or your condition may worsen. A special MedGuide will be given to you by the pharmacist with each prescription and refill. Be sure to read this information carefully each time. Talk  to your care team about the use of this medication in children. While it may be prescribed for children as young as 7 years for selected conditions, precautions do apply. Overdosage: If you think you have taken too much of this medicine contact a poison control center or emergency room at once. NOTE: This medicine is only for you. Do not share this medicine with others. What if I miss a dose? If you miss a dose, take it as soon as you can. If it is almost time for your next dose, take only that dose. Do not take double or extra doses. What may interact with this medication? Do not take this medication with any of the following: Desvenlafaxine Levomilnacipran Linezolid MAOIs, such as Carbex, Eldepryl, Emsam, Marplan, Nardil, and Parnate Methylene blue (injected into a vein) Milnacipran Safinamide Thioridazine Venlafaxine Viloxazine This medication may also interact with the following: Alcohol Amphetamines Aspirin and aspirin-like medications Certain antibiotics, such as ciprofloxacin and enoxacin Certain medications for blood pressure, heart disease, irregular heart beat Certain medications for mental health conditions Certain medications for migraine headache, such as almotriptan, eletriptan, frovatriptan, naratriptan, rizatriptan, sumatriptan, zolmitriptan Certain medications that treat or prevent blood clots, such as warfarin, enoxaparin, and dalteparin Cimetidine Fentanyl Lithium NSAIDS, medications for pain and inflammation, such as ibuprofen or naproxen Phentermine Procarbazine Rasagiline Sibutramine St. John's Wort Theophylline Tramadol Tryptophan This list may not describe all possible interactions. Give your health care provider a list of all the medicines, herbs, non-prescription drugs, or dietary supplements you use. Also tell them if you smoke, drink alcohol, or use illegal drugs. Some items may interact with your medicine. What should I watch for while using this  medication?  Tell your care team if your symptoms do not get better or if they get worse. Visit your care team for regular checks on your progress. Because it may take several weeks to see the full effects of this medication, it is important to continue your treatment as prescribed by your care team. This medication may cause serious skin reactions. They can happen weeks to months after starting the medication. Contact your care team right away if you notice fevers or flu-like symptoms with a rash. The rash may be red or purple and then turn into blisters or peeling of the skin. You may also notice a red rash with swelling of the face, lips, or lymph nodes in your neck or under your arms. Watch for new or worsening thoughts of suicide or depression. This includes sudden changes in mood, behaviors, or thoughts. These changes can happen at any time but are more common in the beginning of treatment or after a change in dose. Call your care team right away if you experience these thoughts or worsening depression. This medication may cause mood and behavior changes, such as anxiety, nervousness, irritability, hostility, restlessness, excitability, hyperactivity, or trouble sleeping. These changes can happen at any time but are more common in the beginning of treatment or after a change in dose. Call your care team right away if you notice any of these symptoms. This medication may affect your coordination, reaction time, or judgment. Do not drive or operate machinery until you know how this medication affects you. Sit up or stand slowly to reduce the risk of dizzy or fainting spells. Drinking alcohol with this medication can increase the risk of these side effects. This medication may increase blood sugar. The risk may be higher in patients who already have diabetes. Ask your care team what you can do to lower your risk of diabetes while taking this medication. This medication can cause an increase in blood  pressure. This medication can also cause a sudden drop in your blood pressure, which may make you feel faint and increase the chance of a fall. These effects are most common when you first start the medication or when the dose is increased, or during use of other medications that can cause a sudden drop in blood pressure. Check with your care team for instructions on monitoring your blood pressure while taking this medication. Your mouth may get dry. Chewing sugarless gum or sucking hard candy and drinking plenty of water may help. Contact your care team if the problem does not go away or is severe. What side effects may I notice from receiving this medication? Side effects that you should report to your care team as soon as possible: Allergic reactions--skin rash, itching, hives, swelling of the face, lips, tongue, or throat Bleeding--bloody or black, tar-like stools, red or dark brown urine, vomiting blood or brown material that looks like coffee grounds, small, red or purple spots on skin, unusual bleeding or bruising Increase in blood pressure Liver injury--right upper belly pain, loss of appetite, nausea, light-colored stool, dark yellow or brown urine, yellowing skin or eyes, unusual weakness or fatigue Low sodium level--muscle weakness, fatigue, dizziness, headache, confusion Redness, blistering, peeling, or loosening of the skin, including inside the mouth Serotonin syndrome--irritability, confusion, fast or irregular heartbeat, muscle stiffness, twitching muscles, sweating, high fever, seizures, chills, vomiting, diarrhea Sudden eye pain or change in vision such as blurry vision, seeing halos around lights, vision loss Thoughts of suicide or self-harm, worsening mood, feelings of depression Trouble  passing urine Side effects that usually do not require medical attention (report to your care team if they continue or are bothersome): Change in sex drive or  performance Constipation Diarrhea Dizziness Dry mouth Excessive sweating Loss of appetite Nausea Vomiting This list may not describe all possible side effects. Call your doctor for medical advice about side effects. You may report side effects to FDA at 1-800-FDA-1088. Where should I keep my medication? Keep out of the reach of children and pets. Store at room temperature between 15 and 30 degrees C (59 to 86 degrees F). Get rid of any unused medication after the expiration date. To get rid of medications that are no longer needed or have expired: Take the medication to a medication take-back program. Check with your pharmacy or law enforcement to find a location. If you cannot return the medication, check the label or package insert to see if the medication should be thrown out in the garbage or flushed down the toilet. If you are not sure, ask your care team. If it is safe to put it in the trash, take the medication out of the container. Mix the medication with cat litter, dirt, coffee grounds, or other unwanted substance. Seal the mixture in a bag or container. Put it in the trash. NOTE: This sheet is a summary. It may not cover all possible information. If you have questions about this medicine, talk to your doctor, pharmacist, or health care provider.  2024 Elsevier/Gold Standard (2022-08-19 00:00:00)

## 2023-09-17 ENCOUNTER — Other Ambulatory Visit: Payer: Self-pay | Admitting: Internal Medicine

## 2023-09-19 ENCOUNTER — Other Ambulatory Visit (HOSPITAL_COMMUNITY): Payer: Self-pay

## 2023-09-19 ENCOUNTER — Other Ambulatory Visit: Payer: Self-pay

## 2023-09-19 MED FILL — Amlodipine Besylate Tab 2.5 MG (Base Equivalent): ORAL | 90 days supply | Qty: 90 | Fill #0 | Status: AC

## 2023-09-29 ENCOUNTER — Ambulatory Visit: Payer: Medicare HMO | Admitting: Psychiatry

## 2023-09-29 ENCOUNTER — Encounter: Payer: Self-pay | Admitting: Psychiatry

## 2023-09-29 VITALS — BP 144/68 | HR 86 | Temp 97.3°F | Ht 60.0 in | Wt 181.2 lb

## 2023-09-29 DIAGNOSIS — F3178 Bipolar disorder, in full remission, most recent episode mixed: Secondary | ICD-10-CM

## 2023-09-29 DIAGNOSIS — F4322 Adjustment disorder with anxiety: Secondary | ICD-10-CM | POA: Diagnosis not present

## 2023-09-29 DIAGNOSIS — G4701 Insomnia due to medical condition: Secondary | ICD-10-CM

## 2023-09-29 NOTE — Progress Notes (Signed)
BH MD OP Progress Note  09/29/2023 9:29 AM Julia Cooke  MRN:  [id number]  Chief Complaint:  Chief Complaint  Patient presents with   Follow-up   Depression   Anxiety   Medication Refill   HPI: Julia Cooke is a 72 year old Caucasian female, married, on SSI, lives in [place], has a history of bipolar disorder, anxiety disorder, insomnia, chronic pain likely due to neuropathy was evaluated office today.  Patient today reports she is currently managing her anxiety symptoms better than before.  She agrees that her anxiety is mostly situational and she is at a stage where she has accepted a lot of of her life stressors which has helped her to cope with it better than before.  Patient reports she continues to have financial problems however she is taking it day-by-day.  She reports her husband is supportive.  Patient reports she did not continue the Cymbalta since she felt it caused her to have diarrhea after she took it for a few days.  She however believes she had the diarrhea even before she started the Cymbalta and she was on omeprazole for that.  The omeprazole was discontinued since she felt it has a risk of causing long-term side effects.  Since stopping the omeprazole diarrhea returned.  She hence is not sure if the Cymbalta had anything to do with it.  She also felt the Cymbalta did not help with her anxiety after taking it for a few days.  Patient reports sleep is overall good.  She goes to bed at around midnight and wakes up at around 9 or 9:30 AM.  She uses the Ambien only as needed.  She is interested in finding a psychotherapist.  Patient denies any suicidality, homicidality or perceptual disturbances.  Patient denies any manic or hypomanic symptoms.  Denies any other concerns today.  Visit Diagnosis:    ICD-10-CM   1. Bipolar disorder, in full remission, most recent episode mixed (HCC)  F31.78     2. Adjustment disorder with anxious mood  F43.22     3. Insomnia due  to medical condition  G47.01    Mood, pain      Past Psychiatric History: I have reviewed past psychiatric history from progress note on 05/02/2018.  Past trials of Abilify, Seroquel.  Past Medical History:  Past Medical History:  Diagnosis Date   Bipolar disorder (HCC)    takes Abilify   Depression    Dysrhythmia 10/2016   LBBB on ekg...nothing to compare it to.   Heart murmur    Hypertension    Nephrolithiasis 2000   s/p lithotripsy   Psychotic disorder (HCC)    mania   Wears contact lenses     Past Surgical History:  Procedure Laterality Date   ARTHRODESIS METATARSALPHALANGEAL JOINT (MTPJ) Left 03/09/2023   Procedure: ARTHRODESIS METATARSALPHALANGEAL JOINT (MTPJ);  Surgeon: Gwyneth Revels, DPM;  Location: [facility];  Service: Podiatry;  Laterality: Left;   CAPSULOTOMY METATARSOPHALANGEAL Left 03/09/2023   Procedure: CAPSULOTOMY METATARSOPHALANGEAL SECOND;  Surgeon: Gwyneth Revels, DPM;  Location: [facility];  Service: Podiatry;  Laterality: Left;   EXTRACORPOREAL SHOCK WAVE LITHOTRIPSY Left 01/06/2017   Procedure: EXTRACORPOREAL SHOCK WAVE LITHOTRIPSY (ESWL);  Surgeon: Vanna Scotland, MD;  Location: [facility];  Service: Urology;  Laterality: Left;   FLEXOR TENOTOMY  Left 03/09/2023   Procedure: FLEXOR TENOTOMY;  Surgeon: Gwyneth Revels, DPM;  Location: [facility];  Service: Podiatry;  Laterality: Left;   LITHOTRIPSY  2000  Family Psychiatric History: I have reviewed family psychiatric history from progress note on 05/02/2018.  Family History:  Family History  Problem Relation Age of Onset   Hyperlipidemia Mother    Stroke Mother    Hypertension Mother    Hyperlipidemia Father    Hypertension Father    Cancer Sister        breast   Breast cancer Sister 17   Fibromyalgia Sister    Hypertension Sister    Heart Problems Sister    Hypertension Sister    Heart disease Brother        myocardial infarction   Heart attack Brother    Hypertension  Brother    Prostate cancer Neg Hx    Kidney cancer Neg Hx    Bladder Cancer Neg Hx     Social History: I have reviewed social history from progress note on 05/02/2018. Social History   Socioeconomic History   Marital status: Married    Spouse name: richard   Number of children: 3   Years of education: Not on file   Highest education level: Some college, no degree  Occupational History    Comment: retired  Tobacco Use   Smoking status: Never   Smokeless tobacco: Never  Vaping Use   Vaping status: Never Used  Substance and Sexual Activity   Alcohol use: No    Alcohol/week: 0.0 - 2.0 standard drinks of alcohol    Comment: 2-3 times weekly   Drug use: No   Sexual activity: Yes    Birth control/protection: Post-menopausal, None  Other Topics Concern   Not on file  Social History Narrative   Patient lives at home with her husband of 41 years. They have 3 adult children (2 boys, 1 girl). They recently moved back to [place] in January. 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 Patient has also worked as a Occupational hygienist.   Social Determinants of Health   Financial Resource Strain: High Risk (03/01/2023)   Overall Financial Resource Strain (CARDIA)    Difficulty of Paying Living Expenses: Hard  Food Insecurity: Food Insecurity Present (03/01/2023)   Hunger Vital Sign    Worried About Running Out of Food in the Last Year: Sometimes true    Ran Out of Food in the Last Year: Never true  Transportation Needs: No Transportation Needs (03/01/2023)   PRAPARE - Administrator, Civil Service (Medical): No    Lack of Transportation (Non-Medical): No  Physical Activity: Inactive (03/01/2023)   Exercise Vital Sign    Days of Exercise per Week: 0 days    Minutes of Exercise per Session: 0 min  Stress: No Stress Concern Present (03/01/2023)   [employer] of Occupational Health - Occupational Stress Questionnaire    Feeling of Stress : Not at all  Social  Connections: Socially Integrated (03/01/2023)   Social Connection and Isolation Panel [NHANES]    Frequency of Communication with Friends and Family: More than three times a week    Frequency of Social Gatherings with Friends and Family: Twice a week    Attends Religious Services: More than 4 times per year    Active Member of [employer] or Organizations: Yes    Attends Engineer, structural: More than 4 times per year    Marital Status: Married    Allergies:  Allergies  Allergen Reactions   Penicillins Swelling    As a child    Metabolic Disorder Labs: Lab  Results  Component Value Date   HGBA1C 5.2 03/01/2023   MPG 94 01/20/2018   Lab Results  Component Value Date   PROLACTIN 3.2 (L) 01/20/2018   Lab Results  Component Value Date   CHOL 151 09/01/2023   TRIG 129.0 09/01/2023   HDL 48.50 09/01/2023   CHOLHDL 3 09/01/2023   VLDL 25.8 09/01/2023   LDLCALC 76 09/01/2023   LDLCALC 138 (H) 12/01/2021   Lab Results  Component Value Date   TSH 0.89 12/01/2021   TSH 0.95 04/08/2021    Therapeutic Level Labs: No results found for: "LITHIUM" No results found for: "VALPROATE" Lab Results  Component Value Date   CBMZ 2.9 (L) 01/07/2014    Current Medications: Current Outpatient Medications  Medication Sig Dispense Refill   amLODipine (NORVASC) 2.5 MG tablet TAKE 1 TABLET BY MOUTH AT BEDTIME 90 tablet 1   atorvastatin (LIPITOR) 10 MG tablet Take 1 tablet (10 mg total) by mouth daily. 90 tablet 0   budesonide-formoterol (SYMBICORT) 80-4.5 MCG/ACT inhaler Inhale 2 puffs into the lungs 2 (two) times daily. 1 each 1   cephALEXin (KEFLEX) 500 MG capsule Take 500 mg by mouth 4 (four) times daily.     Cyanocobalamin (VITAMIN B-12 PO) Take by mouth daily.     DULoxetine (CYMBALTA) 30 MG capsule Take 1 capsule (30 mg total) by mouth daily. 30 capsule 1   gabapentin (NEURONTIN) 100 MG capsule Take 1 capsule (100 mg total) by mouth 3 (three) times daily. 90 capsule 3    irbesartan (AVAPRO) 300 MG tablet Take 1 tablet (300 mg total) by mouth daily. 90 tablet 3   lamoTRIgine (LAMICTAL) 25 MG tablet Take 1 tablet (25 mg total) by mouth 2 (two) times daily. 180 tablet 1   loratadine (CLARITIN) 10 MG tablet Take 1 tablet (10 mg total) by mouth daily. 30 tablet 11   melatonin 5 MG TABS Take 5 mg by mouth.     metoprolol succinate (TOPROL-XL) 50 MG 24 hr tablet Take 1 tablet (50 mg total) by mouth daily with or immediately following a meal. 90 tablet 3   Multiple Vitamin (MULTIVITAMIN) tablet Take 1 tablet by mouth daily.     Spacer/Aero-Holding Chambers DEVI 1 Units by Does not apply route daily. 1 Units 0   zolpidem (AMBIEN) 5 MG tablet Take 0.5-1 tablets (2.5-5 mg total) by mouth at bedtime as needed for sleep. 30 tablet 3   No current facility-administered medications for this visit.     Musculoskeletal: Strength & Muscle Tone: within normal limits Gait & Station: normal Patient leans: N/A  Psychiatric Specialty Exam: Review of Systems  Blood pressure (!) 144/68, pulse 86, temperature (!) 97.3 F (36.3 C), temperature source Skin, height 5' (1.524 m), weight 181 lb 3.2 oz (82.2 kg).Body mass index is 35.39 kg/m.  General Appearance: Fairly Groomed  Eye Contact:  Fair  Speech:  Clear and Coherent  Volume:  Normal  Mood:  Anxious  Affect:  Congruent  Thought Process:  Goal Directed and Descriptions of Associations: Intact  Orientation:  Full (Time, Place, and Person)  Thought Content: Logical   Suicidal Thoughts:  No  Homicidal Thoughts:  No  Memory:  Immediate;   Fair Recent;   Fair Remote;   Fair  Judgement:  Fair  Insight:  Fair  Psychomotor Activity:  Normal  Concentration:  Concentration: Fair and Attention Span: Fair  Recall:  [employer] of Knowledge: Fair  Language: Fair  Akathisia:  No  Handed:  Right  AIMS (if indicated): not done  Assets:  Communication Skills Desire for Improvement Housing Intimacy Social  Support Talents/Skills Transportation  ADL's:  Intact  Cognition: WNL  Sleep:  Fair   Screenings: Midwife Visit from 06/17/2022 in [facility] Office Visit from 03/27/2021 in [facility]  AIMS Total Score 0 1      AUDIT    Flowsheet Row Clinical Support from 03/01/2023 in [facility]  Alcohol Use Disorder Identification Test Final Score (AUDIT) 3      GAD-7    Flowsheet Row Office Visit from 09/08/2023 in [facility] Office Visit from 12/01/2022 in [facility] Office Visit from 10/26/2022 in [facility] Office Visit from 06/17/2022 in [facility]  Total GAD-7 Score 6 4 13 2       [id number]    Flowsheet Row Office Visit from 09/08/2023 in [facility] Office Visit from 08/10/2023 in [facility] [employer] Visit from 06/01/2023 in [facility] Video Visit from 05/12/2023 in [facility] Clinical Support from 03/01/2023 in [facility] Total Score 1 0 0 0 0      Flowsheet Row Office Visit from 09/29/2023 in [facility] Office Visit from 09/08/2023 in [facility] Video Visit from 07/14/2023 in [facility]  C-SSRS RISK CATEGORY No Risk No Risk No Risk        Assessment and Plan: Eyla Tallon is a 72 year old Caucasian female who has a history of bipolar disorder, presented with anxiety symptoms although improving noncompliant on Cymbalta, patient will benefit from  medication management and psychotherapy sessions.  Plan Bipolar disorder type I mixed mild in remission Lamotrigine 25 mg p.o. twice daily.  Insomnia-improving Continue Ambien 2.5-5 mg p.o. nightly as needed Continue sleep hygiene techniques.  Adjustment disorder with anxious mood-improving Patient agrees to retrial of Cymbalta 30 mg p.o. daily. She is not sure if the Cymbalta caused the diarrhea since she already had diarrhea prior to initiation of Cymbalta. If she does have worsening of diarrhea on Cymbalta and she plans to stop it patient advised to follow up with primary care provider for dosage readjustment of gabapentin to help target her anxiety and pain. She is currently on gabapentin 300 mg p.o. nightly. Reviewed [place] PMP AWARxE Patient advised to contact Ms. Phyllis Ginger at [phone] for psychotherapy.  Follow-up in clinic in 3 months or sooner if needed.  Collaboration of Care: Collaboration of Care: Referral or follow-up with counselor/therapist AEB patient and Delorise Shiner to establish care with therapist, provided information for Ms. Delorise Shiner [phone]  Patient/Guardian was advised Release of Information must be obtained prior to any record release in order to collaborate their care with an outside provider. Patient/Guardian was advised if they have not already done so to contact the registration department to sign all necessary forms in order for [place] to release information regarding their care.   Consent: Patient/Guardian gives verbal consent for treatment and assignment of benefits for services provided during this visit. Patient/Guardian expressed understanding and agreed to proceed.   This note was generated in part or whole with voice recognition software. Voice recognition  is usually quite accurate but there are transcription errors that can and very often do occur. I apologize for any typographical errors that were not detected and corrected.    Jomarie Longs,  MD 09/30/2023, 8:13 AM

## 2023-09-29 NOTE — Patient Instructions (Signed)
[facility] - CALL FOR THERAPY - [phone]

## 2023-11-07 ENCOUNTER — Other Ambulatory Visit: Payer: Self-pay

## 2023-11-07 ENCOUNTER — Other Ambulatory Visit: Payer: Self-pay | Admitting: Internal Medicine

## 2023-11-07 MED ORDER — GABAPENTIN 100 MG PO CAPS
100.0000 mg | ORAL_CAPSULE | Freq: Three times a day (TID) | ORAL | 3 refills | Status: DC
  Filled 2023-11-07: qty 90, 30d supply, fill #0
  Filled 2023-12-13: qty 90, 30d supply, fill #1
  Filled 2024-01-30: qty 90, 30d supply, fill #2
  Filled 2024-03-30: qty 90, 30d supply, fill #3

## 2023-11-08 ENCOUNTER — Other Ambulatory Visit: Payer: Self-pay

## 2023-12-02 ENCOUNTER — Encounter: Payer: Self-pay | Admitting: Internal Medicine

## 2023-12-02 ENCOUNTER — Ambulatory Visit (INDEPENDENT_AMBULATORY_CARE_PROVIDER_SITE_OTHER): Payer: Medicare HMO | Admitting: Internal Medicine

## 2023-12-02 VITALS — BP 130/78 | HR 90 | Ht 60.0 in | Wt 185.6 lb

## 2023-12-02 DIAGNOSIS — Z6833 Body mass index (BMI) 33.0-33.9, adult: Secondary | ICD-10-CM | POA: Diagnosis not present

## 2023-12-02 DIAGNOSIS — E785 Hyperlipidemia, unspecified: Secondary | ICD-10-CM

## 2023-12-02 DIAGNOSIS — R053 Chronic cough: Secondary | ICD-10-CM | POA: Diagnosis not present

## 2023-12-02 DIAGNOSIS — I10 Essential (primary) hypertension: Secondary | ICD-10-CM

## 2023-12-02 DIAGNOSIS — K580 Irritable bowel syndrome with diarrhea: Secondary | ICD-10-CM

## 2023-12-02 DIAGNOSIS — U099 Post covid-19 condition, unspecified: Secondary | ICD-10-CM

## 2023-12-02 DIAGNOSIS — F5101 Primary insomnia: Secondary | ICD-10-CM

## 2023-12-02 DIAGNOSIS — E6609 Other obesity due to excess calories: Secondary | ICD-10-CM | POA: Diagnosis not present

## 2023-12-02 DIAGNOSIS — E66811 Obesity, class 1: Secondary | ICD-10-CM | POA: Diagnosis not present

## 2023-12-02 DIAGNOSIS — M2550 Pain in unspecified joint: Secondary | ICD-10-CM

## 2023-12-02 MED ORDER — DICYCLOMINE HCL 10 MG PO CAPS: 10.0000 mg | ORAL_CAPSULE | Freq: Three times a day (TID) | ORAL | 3 refills | Status: DC

## 2023-12-02 NOTE — Assessment & Plan Note (Signed)
 Autoimmune workup has been  negative. Encouarged to start exercising   Lab Results  Component Value Date   ESRSEDRATE 12 09/01/2023   Lab Results  Component Value Date   CRP <1.0 01/31/2023   Lab Results  Component Value Date   ANA NEGATIVE 01/31/2023

## 2023-12-02 NOTE — Assessment & Plan Note (Signed)
 Improved on 3 drug regimen. Home readings reviewed .  Patient is taking her medications as prescribed and notes no adverse effects.

## 2023-12-02 NOTE — Assessment & Plan Note (Signed)
 She had no improvement despite increase in  trazodone to 100 mg qhs.  Recommend discussing with her psychiatrist dr Elna Breslow

## 2023-12-02 NOTE — Assessment & Plan Note (Signed)
 Request for pharmacotherapy deferred as patient is not exercising at all

## 2023-12-02 NOTE — Assessment & Plan Note (Signed)
 LDL and triglycerides are at goal on current medications. He has no side effects and liver enzymes are normal. No changes today   Lab Results  Component Value Date   CHOL 151 09/01/2023   HDL 48.50 09/01/2023   LDLCALC 76 09/01/2023   LDLDIRECT 162.0 12/23/2016   TRIG 129.0 09/01/2023   CHOLHDL 3 09/01/2023   Lab Results  Component Value Date   ALT 19 09/01/2023   AST 19 09/01/2023   ALKPHOS 79 09/01/2023   BILITOT 1.1 09/01/2023

## 2023-12-02 NOTE — Assessment & Plan Note (Addendum)
 She was lost to follow up  but feels that the dicyclomine helped.  Resume medication,  follow up in one month

## 2023-12-02 NOTE — Progress Notes (Signed)
 Subjective:  Patient ID: Julia Cooke, female    DOB: Oct 22, 1951  Age: 73 y.o. MRN: [id number]  CC: The primary encounter diagnosis was Irritable bowel syndrome with diarrhea. Diagnoses of Polyarthralgia, Class 1 obesity due to excess calories without serious comorbidity with body mass index (BMI) of 33.0 to 33.9 in adult, Primary insomnia, Hyperlipidemia LDL goal <100, Essential hypertension, and Post-COVID chronic cough were also pertinent to this visit.   HPI [name] presents for  Chief Complaint  Patient presents with   Medical Management of Chronic Issues   1) POST COVID COUGH: seen in October with persistent symptoms, normal chest x ray August.   NO CHANGE.  HAS FITS OF COUGHING triggered by environmental exposures .  Did not tolerate symbicort .  Was  REFERRED FOR PFTS and pulmonology evaluation after October visit  but patient did not return the calls or messages for either BECAUSEI'V  BEEN TOO BUSY   2) POLYARTHRALGIAS:  she reported diffuse myalgias, arthralgias at last visit. ESR was NORMAL.   No longer taking cymbalta   prescribed by D r Eappen .  Cites orthopedic issues:  bilateral knees,  left foot pain    3) WEIGHT GAIN: requesting  pharmacotherapy .  Not exercising due to orthopedic complaints.  Had surgery on left foot in July,  feels that the metal plate in her BIG TOE IS CAUSING PAIN WITH WEIGHT BEARING .  SURGERY WAS DONE BY DR  ASHLEY. SABRA LAST FOLLOW UP WAS OM OCTOBER    4) GERD :  related to spicy foods,  worse after tomato sauce etc,  not occurring daily not taking PPI or H2 blocker   3) IBS:  last discussed  Dec  2022 as  post prandial loose stools occurring 15 minutes after every meal.   Having 4 to 5 daily.  Has been going on for 2 years.  No weight loss.  Not waking her up at night.  No blood in stool.   Thinks the dicyclomine  helped.     Outpatient Medications Prior to Visit  Medication Sig Dispense Refill   amLODipine  (NORVASC ) 2.5 MG tablet TAKE 1  TABLET BY MOUTH AT BEDTIME 90 tablet 1   atorvastatin  (LIPITOR) 10 MG tablet Take 1 tablet (10 mg total) by mouth daily. 90 tablet 0   budesonide -formoterol  (SYMBICORT ) 80-4.5 MCG/ACT inhaler Inhale 2 puffs into the lungs 2 (two) times daily. 1 each 1   cephALEXin (KEFLEX) 500 MG capsule Take 500 mg by mouth 4 (four) times daily.     Cyanocobalamin  (VITAMIN B-12 PO) Take by mouth daily.     DULoxetine  (CYMBALTA ) 30 MG capsule Take 1 capsule (30 mg total) by mouth daily. 30 capsule 1   gabapentin  (NEURONTIN ) 100 MG capsule Take 1 capsule (100 mg total) by mouth 3 (three) times daily. 90 capsule 3   irbesartan  (AVAPRO ) 300 MG tablet Take 1 tablet (300 mg total) by mouth daily. 90 tablet 3   lamoTRIgine  (LAMICTAL ) 25 MG tablet Take 1 tablet (25 mg total) by mouth 2 (two) times daily. 180 tablet 1   loratadine  (CLARITIN ) 10 MG tablet Take 1 tablet (10 mg total) by mouth daily. 30 tablet 11   melatonin 5 MG TABS Take 5 mg by mouth.     metoprolol  succinate (TOPROL -XL) 50 MG 24 hr tablet Take 1 tablet (50 mg total) by mouth daily with or immediately following a meal. 90 tablet 3   Multiple Vitamin (MULTIVITAMIN) tablet Take 1 tablet by mouth daily.  Spacer/Aero-Holding Chambers DEVI 1 Units by Does not apply route daily. 1 Units 0   zolpidem  (AMBIEN ) 5 MG tablet Take 0.5-1 tablets (2.5-5 mg total) by mouth at bedtime as needed for sleep. 30 tablet 3   No facility-administered medications prior to visit.    Review of Systems;  Patient denies headache, fevers, malaise, unintentional weight loss, skin rash, eye pain, sinus congestion and sinus pain, sore throat, dysphagia,  hemoptysis , dyspnea, wheezing, chest pain, palpitations, orthopnea, edema, abdominal pain, nausea, melena,  constipation, flank pain, dysuria, hematuria, urinary  Frequency, nocturia, numbness, tingling, seizures,  Focal weakness, Loss of consciousness,  Tremor, insomnia, depression, anxiety, and suicidal ideation.       Objective:  BP 130/78   Pulse 90   Ht 5' (1.524 m)   Wt 185 lb 9.6 oz (84.2 kg)   SpO2 95%   BMI 36.25 kg/m   BP Readings from Last 3 Encounters:  12/02/23 130/78  09/01/23 130/76  08/10/23 118/78    Wt Readings from Last 3 Encounters:  12/02/23 185 lb 9.6 oz (84.2 kg)  09/01/23 183 lb 6.4 oz (83.2 kg)  08/10/23 184 lb (83.5 kg)    Physical Exam Vitals reviewed.  Constitutional:      General: She is not in acute distress.    Appearance: Normal appearance. She is normal weight. She is not ill-appearing, toxic-appearing or diaphoretic.  HENT:     Head: Normocephalic.  Eyes:     General: No scleral icterus.       Right eye: No discharge.        Left eye: No discharge.     Conjunctiva/sclera: Conjunctivae normal.  Cardiovascular:     Rate and Rhythm: Normal rate and regular rhythm.     Heart sounds: Normal heart sounds.  Pulmonary:     Effort: Pulmonary effort is normal. No respiratory distress.     Breath sounds: Normal breath sounds.  Musculoskeletal:        General: Normal range of motion.  Skin:    General: Skin is warm and dry.  Neurological:     General: No focal deficit present.     Mental Status: She is alert and oriented to person, place, and time. Mental status is at baseline.  Psychiatric:        Mood and Affect: Mood normal.        Behavior: Behavior normal.        Thought Content: Thought content normal.        Judgment: Judgment normal.    Lab Results  Component Value Date   HGBA1C 5.2 03/01/2023   HGBA1C 5.0 12/01/2021   HGBA1C 4.8 06/11/2020    Lab Results  Component Value Date   CREATININE 0.80 09/01/2023   CREATININE 0.81 03/01/2023   CREATININE 0.82 01/31/2023    Lab Results  Component Value Date   WBC 6.0 09/01/2023   HGB 13.8 09/01/2023   HCT 40.9 09/01/2023   PLT 255.0 09/01/2023   GLUCOSE 112 (H) 09/01/2023   CHOL 151 09/01/2023   TRIG 129.0 09/01/2023   HDL 48.50 09/01/2023   LDLDIRECT 162.0 12/23/2016   LDLCALC  76 09/01/2023   ALT 19 09/01/2023   AST 19 09/01/2023   NA 137 09/01/2023   K 3.7 09/01/2023   CL 104 09/01/2023   CREATININE 0.80 09/01/2023   BUN 23 09/01/2023   CO2 28 09/01/2023   TSH 0.89 12/01/2021   HGBA1C 5.2 03/01/2023   MICROALBUR <0.7 03/01/2023  MM 3D SCREEN BREAST BILATERAL Result Date: 04/06/2023 CLINICAL DATA:  Screening. EXAM: DIGITAL SCREENING BILATERAL MAMMOGRAM WITH TOMOSYNTHESIS AND CAD TECHNIQUE: Bilateral screening digital craniocaudal and mediolateral oblique mammograms were obtained. Bilateral screening digital breast tomosynthesis was performed. The images were evaluated with computer-aided detection. COMPARISON:  Previous exam(s). ACR Breast Density Category a: The breasts are almost entirely fatty. FINDINGS: There are no findings suspicious for malignancy. IMPRESSION: No mammographic evidence of malignancy. A result letter of this screening mammogram will be mailed directly to the patient. RECOMMENDATION: Screening mammogram in one year. (Code:SM-B-01Y) BI-RADS CATEGORY  1: Negative. Electronically Signed   By: Toribio Agreste M.D.   On: 04/06/2023 08:39    Assessment & Plan:  .Irritable bowel syndrome with diarrhea Assessment & Plan: She was lost to follow up  but feels that the dicyclomine  helped.  Resume medication,  follow up in one month    Polyarthralgia Assessment & Plan: Autoimmune workup has been  negative. Encouarged to start exercising   Lab Results  Component Value Date   ESRSEDRATE 12 09/01/2023   Lab Results  Component Value Date   CRP <1.0 01/31/2023   Lab Results  Component Value Date   ANA NEGATIVE 01/31/2023      Class 1 obesity due to excess calories without serious comorbidity with body mass index (BMI) of 33.0 to 33.9 in adult Assessment & Plan: Request for pharmacotherapy deferred as patient is not exercising at all    Primary insomnia Assessment & Plan: She had no improvement despite increase in  trazodone  to 100 mg  qhs.  Recommend discussing with her psychiatrist dr Coby    Hyperlipidemia LDL goal <100 Assessment & Plan: LDL and triglycerides are at goal on current medications. He has no side effects and liver enzymes are normal. No changes today   Lab Results  Component Value Date   CHOL 151 09/01/2023   HDL 48.50 09/01/2023   LDLCALC 76 09/01/2023   LDLDIRECT 162.0 12/23/2016   TRIG 129.0 09/01/2023   CHOLHDL 3 09/01/2023   Lab Results  Component Value Date   ALT 19 09/01/2023   AST 19 09/01/2023   ALKPHOS 79 09/01/2023   BILITOT 1.1 09/01/2023      Essential hypertension Assessment & Plan: Improved on 3 drug regimen. Home readings reviewed .  Patient is taking her medications as prescribed and notes no adverse effects.     Post-COVID chronic cough Assessment & Plan: Chest x ray reviewed from September and normal. Continue cough suppression .  she was referred  for pulmonary function tests and pulmonary consult in October but did not return their calls to schedule. Given her intolerance to symbicort , and normal lung exam  I have deferred treatment  until this workup is done.    Other orders -     Dicyclomine  HCl; Take 1 capsule (10 mg total) by mouth 4 (four) times daily -  before meals and at bedtime.  Dispense: 120 capsule; Refill: 3      Follow-up: Return in about 4 weeks (around 12/30/2023).   Verneita LITTIE Kettering, MD

## 2023-12-02 NOTE — Patient Instructions (Addendum)
 I WILL NO LONGER PRESCRIBE MEDICATIONS FOR WEIGHT LOSS   I ENCOURAGE YOU TO JOIN A GYM THAT HAS SILVER SNEAKERS and address your eating habits.  MY FITNESS PL MAY BE A FREE   application to help guide your weight loss efforts    You were referred to pulmonology and they have given up trying to contact you  PLEASE CALL [facility] TO SET UP YOUR PULMONARY FUNCTION TESTS    Your diarrhea may be due to IRRITABLE BOWEL.  Please resume dicyclomine  BEFORE EACH MEAL AND FOLLOW UP IN ONE MONTH

## 2023-12-02 NOTE — Assessment & Plan Note (Signed)
 Chest x ray reviewed from September and normal. Continue cough suppression .  she was referred  for pulmonary function tests and pulmonary consult in October but did not return their calls to schedule. Given her intolerance to symbicort , and normal lung exam  I have deferred treatment  until this workup is done.

## 2023-12-13 ENCOUNTER — Other Ambulatory Visit: Payer: Self-pay

## 2023-12-13 MED FILL — Amlodipine Besylate Tab 2.5 MG (Base Equivalent): ORAL | 90 days supply | Qty: 90 | Fill #1 | Status: AC

## 2023-12-19 ENCOUNTER — Ambulatory Visit: Payer: Medicare HMO

## 2023-12-22 ENCOUNTER — Ambulatory Visit: Payer: Medicare HMO

## 2023-12-26 ENCOUNTER — Telehealth (INDEPENDENT_AMBULATORY_CARE_PROVIDER_SITE_OTHER): Payer: Medicare HMO | Admitting: Psychiatry

## 2023-12-26 DIAGNOSIS — F3178 Bipolar disorder, in full remission, most recent episode mixed: Secondary | ICD-10-CM

## 2023-12-26 NOTE — Progress Notes (Signed)
No response to call or text or video invite

## 2023-12-27 ENCOUNTER — Other Ambulatory Visit (HOSPITAL_COMMUNITY): Payer: Self-pay

## 2023-12-28 ENCOUNTER — Telehealth: Payer: Self-pay

## 2023-12-28 NOTE — Telephone Encounter (Signed)
Pharmacy Patient Advocate Encounter  Received notification from [facility] that Prior Authorization for Dicyclomine HCl 10MG  capsules has been APPROVED from 11/30/2023 to 11/28/2024   PA #/Case ID/Reference #: [id number]  KEY: [id number]

## 2024-01-02 ENCOUNTER — Encounter: Payer: Self-pay | Admitting: Internal Medicine

## 2024-01-02 ENCOUNTER — Ambulatory Visit (INDEPENDENT_AMBULATORY_CARE_PROVIDER_SITE_OTHER): Payer: Medicare HMO | Admitting: Internal Medicine

## 2024-01-02 VITALS — BP 126/78 | HR 75 | Ht 60.0 in | Wt 180.0 lb

## 2024-01-02 DIAGNOSIS — U099 Post covid-19 condition, unspecified: Secondary | ICD-10-CM | POA: Diagnosis not present

## 2024-01-02 DIAGNOSIS — E785 Hyperlipidemia, unspecified: Secondary | ICD-10-CM

## 2024-01-02 DIAGNOSIS — E66811 Obesity, class 1: Secondary | ICD-10-CM | POA: Diagnosis not present

## 2024-01-02 DIAGNOSIS — R030 Elevated blood-pressure reading, without diagnosis of hypertension: Secondary | ICD-10-CM

## 2024-01-02 DIAGNOSIS — E6609 Other obesity due to excess calories: Secondary | ICD-10-CM | POA: Diagnosis not present

## 2024-01-02 DIAGNOSIS — R053 Chronic cough: Secondary | ICD-10-CM | POA: Diagnosis not present

## 2024-01-02 DIAGNOSIS — Z6833 Body mass index (BMI) 33.0-33.9, adult: Secondary | ICD-10-CM

## 2024-01-02 LAB — HEMOGLOBIN A1C: Hgb A1c MFr Bld: 5 % (ref 4.6–6.5)

## 2024-01-02 LAB — LIPID PANEL
Cholesterol: 150 mg/dL (ref 0–200)
HDL: 48.4 mg/dL (ref >39.00)
LDL Cholesterol: 83 mg/dL (ref 0–99)
NonHDL: 101.93
Total CHOL/HDL Ratio: 3
Triglycerides: 93 mg/dL (ref 0.0–149.0)
VLDL: 18.6 mg/dL (ref 0.0–40.0)

## 2024-01-02 LAB — COMPREHENSIVE METABOLIC PANEL
ALT: 13 U/L (ref 0–35)
AST: 18 U/L (ref 0–37)
Albumin: 4.3 g/dL (ref 3.5–5.2)
Alkaline Phosphatase: 73 U/L (ref 39–117)
BUN: 24 mg/dL — ABNORMAL HIGH (ref 6–23)
CO2: 29 meq/L (ref 19–32)
Calcium: 9.3 mg/dL (ref 8.4–10.5)
Chloride: 102 meq/L (ref 96–112)
Creatinine, Ser: 0.86 mg/dL (ref 0.40–1.20)
GFR: 67.67 mL/min (ref >60.00)
Glucose, Bld: 93 mg/dL (ref 70–99)
Potassium: 4.1 meq/L (ref 3.5–5.1)
Sodium: 138 meq/L (ref 135–145)
Total Bilirubin: 1.3 mg/dL — ABNORMAL HIGH (ref 0.2–1.2)
Total Protein: 7.5 g/dL (ref 6.0–8.3)

## 2024-01-02 LAB — TSH: TSH: 0.68 u[IU]/mL (ref 0.35–5.50)

## 2024-01-02 LAB — MICROALBUMIN / CREATININE URINE RATIO
Creatinine,U: 86.9 mg/dL
Microalb Creat Ratio: 0.9 mg/g (ref 0.0–30.0)
Microalb, Ur: 0.8 mg/dL (ref 0.0–1.9)

## 2024-01-02 LAB — LDL CHOLESTEROL, DIRECT: Direct LDL: 98 mg/dL

## 2024-01-02 MED ORDER — AMLODIPINE BESYLATE 2.5 MG PO TABS: 2.5000 mg | ORAL_TABLET | Freq: Every day | ORAL | 1 refills | Status: DC

## 2024-01-02 NOTE — Progress Notes (Unsigned)
Subjective:  Patient ID: Julia Cooke, female    DOB: 1951/08/05  Age: 73 y.o. MRN: [id number]  CC: The primary encounter diagnosis was Class 1 obesity due to excess calories without serious comorbidity with body mass index (BMI) of 33.0 to 33.9 in adult. Diagnoses of Hyperlipidemia LDL goal <100, Elevated blood pressure reading in office with white coat syndrome, without diagnosis of hypertension, and Post-COVID chronic cough were also pertinent to this visit.   HPI [name] presents for  Chief Complaint  Patient presents with   Medical Management of Chronic Issues    1 month follow up to discuss weight   1) Chronic cough:  currently improved.  Has not scheduled eval by pulmonary yet.   2) obesity;  at her last visit she requested pharmacotherapy for weight loss ,which was denied because she was making no effort to lose weight through diet or exercise.   She  has lost 5 lbs in the last month without pharmacotherapy,  despite no obvious change in diet or lifestyle,  She is  physically active 5-6 hours per day helping husband Julia Cooke complete handyman projects ; activities include cleaning, painting, caulking, and she has the added responsibilities of  maintaining her own  household,  which includes walking 2 dogs.    Reviewed diet  and past attempts at weight loss.  In the recent past she was able to stay on the [employer] for up to 35 minutes.    Meals reviewed:  eats pizza once  weekly,  subscribing to a commercial service which provides prepared meal (frozen,) which are high calorie, eg,   fettucini alfredo , to which she  adds extra chicken.  Breafast:  eggs and bacon and toast.    Outpatient Medications Prior to Visit  Medication Sig Dispense Refill   atorvastatin (LIPITOR) 10 MG tablet Take 1 tablet (10 mg total) by mouth daily. 90 tablet 0   budesonide-formoterol (SYMBICORT) 80-4.5 MCG/ACT inhaler Inhale 2 puffs into the lungs 2 (two) times daily. 1 each 1    Cyanocobalamin (VITAMIN B-12 PO) Take by mouth daily.     dicyclomine (BENTYL) 10 MG capsule Take 1 capsule (10 mg total) by mouth 4 (four) times daily -  before meals and at bedtime. 120 capsule 3   DULoxetine (CYMBALTA) 30 MG capsule Take 1 capsule (30 mg total) by mouth daily. 30 capsule 1   gabapentin (NEURONTIN) 100 MG capsule Take 1 capsule (100 mg total) by mouth 3 (three) times daily. 90 capsule 3   irbesartan (AVAPRO) 300 MG tablet Take 1 tablet (300 mg total) by mouth daily. 90 tablet 3   lamoTRIgine (LAMICTAL) 25 MG tablet Take 1 tablet (25 mg total) by mouth 2 (two) times daily. 180 tablet 1   loratadine (CLARITIN) 10 MG tablet Take 1 tablet (10 mg total) by mouth daily. 30 tablet 11   melatonin 5 MG TABS Take 5 mg by mouth.     metoprolol succinate (TOPROL-XL) 50 MG 24 hr tablet Take 1 tablet (50 mg total) by mouth daily with or immediately following a meal. 90 tablet 3   Multiple Vitamin (MULTIVITAMIN) tablet Take 1 tablet by mouth daily.     Spacer/Aero-Holding Chambers DEVI 1 Units by Does not apply route daily. 1 Units 0   zolpidem (AMBIEN) 5 MG tablet Take 0.5-1 tablets (2.5-5 mg total) by mouth at bedtime as needed for sleep. 30 tablet 3   amLODipine (NORVASC) 2.5 MG tablet TAKE 1 TABLET BY MOUTH  AT BEDTIME 90 tablet 1   cephALEXin (KEFLEX) 500 MG capsule Take 500 mg by mouth 4 (four) times daily.     No facility-administered medications prior to visit.    Review of Systems;  Patient denies headache, fevers, malaise, unintentional weight loss, skin rash, eye pain, sinus congestion and sinus pain, sore throat, dysphagia,  hemoptysis , cough, dyspnea, wheezing, chest pain, palpitations, orthopnea, edema, abdominal pain, nausea, melena, diarrhea, constipation, flank pain, dysuria, hematuria, urinary  Frequency, nocturia, numbness, tingling, seizures,  Focal weakness, Loss of consciousness,  Tremor, insomnia, depression, anxiety, and suicidal ideation.      Objective:  BP  126/78   Pulse 75   Ht 5' (1.524 m)   Wt 180 lb (81.6 kg)   SpO2 93%   BMI 35.15 kg/m   BP Readings from Last 3 Encounters:  01/02/24 126/78  12/02/23 130/78  09/01/23 130/76    Wt Readings from Last 3 Encounters:  01/02/24 180 lb (81.6 kg)  12/02/23 185 lb 9.6 oz (84.2 kg)  09/01/23 183 lb 6.4 oz (83.2 kg)    Physical Exam Vitals reviewed.  Constitutional:      General: She is not in acute distress.    Appearance: Normal appearance. She is obese. She is not ill-appearing, toxic-appearing or diaphoretic.  HENT:     Head: Normocephalic.  Eyes:     General: No scleral icterus.       Right eye: No discharge.        Left eye: No discharge.     Conjunctiva/sclera: Conjunctivae normal.  Cardiovascular:     Rate and Rhythm: Normal rate and regular rhythm.     Heart sounds: Normal heart sounds.  Pulmonary:     Effort: Pulmonary effort is normal. No respiratory distress.     Breath sounds: Normal breath sounds.  Musculoskeletal:        General: Normal range of motion.  Skin:    General: Skin is warm and dry.  Neurological:     General: No focal deficit present.     Mental Status: She is alert and oriented to person, place, and time. Mental status is at baseline.  Psychiatric:        Mood and Affect: Mood normal.        Behavior: Behavior normal.        Thought Content: Thought content normal.        Judgment: Judgment normal.     Lab Results  Component Value Date   HGBA1C 5.0 01/02/2024   HGBA1C 5.2 03/01/2023   HGBA1C 5.0 12/01/2021    Lab Results  Component Value Date   CREATININE 0.86 01/02/2024   CREATININE 0.80 09/01/2023   CREATININE 0.81 03/01/2023    Lab Results  Component Value Date   WBC 6.0 09/01/2023   HGB 13.8 09/01/2023   HCT 40.9 09/01/2023   PLT 255.0 09/01/2023   GLUCOSE 93 01/02/2024   CHOL 150 01/02/2024   TRIG 93.0 01/02/2024   HDL 48.40 01/02/2024   LDLDIRECT 98.0 01/02/2024   LDLCALC 83 01/02/2024   ALT 13 01/02/2024   AST  18 01/02/2024   NA 138 01/02/2024   K 4.1 01/02/2024   CL 102 01/02/2024   CREATININE 0.86 01/02/2024   BUN 24 (H) 01/02/2024   CO2 29 01/02/2024   TSH 0.68 01/02/2024   HGBA1C 5.0 01/02/2024   MICROALBUR 0.8 01/02/2024    MM 3D SCREEN BREAST BILATERAL Result Date: 04/06/2023 CLINICAL DATA:  Screening. EXAM: DIGITAL SCREENING BILATERAL  MAMMOGRAM WITH TOMOSYNTHESIS AND CAD TECHNIQUE: Bilateral screening digital craniocaudal and mediolateral oblique mammograms were obtained. Bilateral screening digital breast tomosynthesis was performed. The images were evaluated with computer-aided detection. COMPARISON:  Previous exam(s). ACR Breast Density Category a: The breasts are almost entirely fatty. FINDINGS: There are no findings suspicious for malignancy. IMPRESSION: No mammographic evidence of malignancy. A result letter of this screening mammogram will be mailed directly to the patient. RECOMMENDATION: Screening mammogram in one year. (Code:SM-B-01Y) BI-RADS CATEGORY  1: Negative. Electronically Signed   By: Elberta Fortis M.D.   On: 04/06/2023 08:39    Assessment & Plan:  .Class 1 obesity due to excess calories without serious comorbidity with body mass index (BMI) of 33.0 to 33.9 in adult Assessment & Plan: Request for pharmacotherapy deferred as patient is not exercising.  Counselling given about her diet , and encouarged to resume 30 mintues of daily exercise   Orders: -     Comprehensive metabolic panel -     Hemoglobin A1c -     TSH  Hyperlipidemia LDL goal <100 -     Lipid panel -     LDL cholesterol, direct  Elevated blood pressure reading in office with white coat syndrome, without diagnosis of hypertension -     Microalbumin / creatinine urine ratio  Post-COVID chronic cough Assessment & Plan: Chest x ray reviewed from September and normal.  she was referred  for pulmonary function tests and pulmonary consult in October but did not return their calls to schedule. Given her  intolerance to symbicort, and normal lung exam  I have deferred treatment  until this workup is done.    Other orders -     amLODIPine Besylate; TAKE 1 TABLET BY MOUTH AT BEDTIME  Dispense: 90 tablet; Refill: 1     Follow-up: Return in about 3 months (around 03/31/2024) for weight management .   Sherlene Shams, MD

## 2024-01-02 NOTE — Patient Instructions (Addendum)
You are losing weight without medicine!  5 lbs since last month   You need 30 minutes of exercise   daily to burn calories   Start looking at the nutritional analysis of your prepared meals  Keep the "added sugar" 5 grams or less   Try taking Benefiber in  8  ounces of water,   30 minutes  prior to your biggest  meal   to help fill you up

## 2024-01-03 ENCOUNTER — Encounter: Payer: Self-pay | Admitting: Internal Medicine

## 2024-01-03 NOTE — Assessment & Plan Note (Signed)
Request for pharmacotherapy deferred as patient is not exercising.  Counselling given about her diet , and encouarged to resume 30 mintues of daily exercise

## 2024-01-03 NOTE — Assessment & Plan Note (Signed)
 Chest x ray reviewed from September and normal.  she was referred  for pulmonary function tests and pulmonary consult in October but did not return their calls to schedule. Given her intolerance to symbicort , and normal lung exam  I have deferred treatment  until this workup is done.

## 2024-01-30 ENCOUNTER — Other Ambulatory Visit: Payer: Self-pay

## 2024-01-30 ENCOUNTER — Telehealth: Payer: Self-pay | Admitting: Psychiatry

## 2024-01-30 ENCOUNTER — Other Ambulatory Visit: Payer: Self-pay | Admitting: Psychiatry

## 2024-01-30 ENCOUNTER — Other Ambulatory Visit: Payer: Self-pay | Admitting: Physician Assistant

## 2024-01-30 DIAGNOSIS — F419 Anxiety disorder, unspecified: Secondary | ICD-10-CM

## 2024-01-30 MED ORDER — DULOXETINE HCL 30 MG PO CPEP
30.0000 mg | ORAL_CAPSULE | Freq: Every day | ORAL | 2 refills | Status: DC
  Filled 2024-01-30: qty 30, 30d supply, fill #0
  Filled 2024-03-30: qty 30, 30d supply, fill #1

## 2024-01-30 MED ORDER — IRBESARTAN 300 MG PO TABS
300.0000 mg | ORAL_TABLET | Freq: Every day | ORAL | 0 refills | Status: DC
  Filled 2024-01-30: qty 90, 90d supply, fill #0

## 2024-01-30 NOTE — Telephone Encounter (Signed)
 Patient verify pharmacy  [facility] Phone: [phone]  Fax: [phone]

## 2024-01-30 NOTE — Telephone Encounter (Signed)
 Need to verify what pharmacy she wants this medication send out to.

## 2024-01-30 NOTE — Telephone Encounter (Signed)
 I have sent Cymbalta to pharmacy at [facility].  However per controlled substance database it looks like patient has 1 more refill pending on the zolpidem or Ambien.

## 2024-01-31 ENCOUNTER — Other Ambulatory Visit: Payer: Self-pay

## 2024-01-31 NOTE — Telephone Encounter (Signed)
 Called pharmacy spoke to Iona she stated that the patient has an Rx for Ambien ready for pickup no refills remaining

## 2024-01-31 NOTE — Telephone Encounter (Signed)
 Please let patient know to send a refill request when she is due for a refill after picking up the Ambien which is ready at the pharmacy.

## 2024-02-01 NOTE — Telephone Encounter (Signed)
 Called patient to make aware of message from provider she voiced understanding

## 2024-03-01 ENCOUNTER — Observation Stay
Admission: EM | Admit: 2024-03-01 | Discharge: 2024-03-02 | Disposition: A | Discharge status: Home / Self Care | Attending: Internal Medicine | Admitting: Internal Medicine

## 2024-03-01 ENCOUNTER — Observation Stay

## 2024-03-01 ENCOUNTER — Other Ambulatory Visit: Payer: Self-pay

## 2024-03-01 ENCOUNTER — Emergency Department

## 2024-03-01 DIAGNOSIS — N132 Hydronephrosis with renal and ureteral calculous obstruction: Secondary | ICD-10-CM | POA: Diagnosis not present

## 2024-03-01 DIAGNOSIS — Z79899 Other long term (current) drug therapy: Secondary | ICD-10-CM | POA: Insufficient documentation

## 2024-03-01 DIAGNOSIS — I10 Essential (primary) hypertension: Secondary | ICD-10-CM | POA: Diagnosis not present

## 2024-03-01 DIAGNOSIS — N200 Calculus of kidney: Secondary | ICD-10-CM | POA: Insufficient documentation

## 2024-03-01 DIAGNOSIS — E785 Hyperlipidemia, unspecified: Secondary | ICD-10-CM | POA: Diagnosis not present

## 2024-03-01 DIAGNOSIS — R109 Unspecified abdominal pain: Secondary | ICD-10-CM | POA: Diagnosis not present

## 2024-03-01 DIAGNOSIS — R278 Other lack of coordination: Principal | ICD-10-CM

## 2024-03-01 DIAGNOSIS — R410 Disorientation, unspecified: Secondary | ICD-10-CM | POA: Diagnosis not present

## 2024-03-01 DIAGNOSIS — N3000 Acute cystitis without hematuria: Secondary | ICD-10-CM | POA: Diagnosis not present

## 2024-03-01 DIAGNOSIS — R531 Weakness: Secondary | ICD-10-CM | POA: Diagnosis not present

## 2024-03-01 DIAGNOSIS — N23 Unspecified renal colic: Secondary | ICD-10-CM

## 2024-03-01 DIAGNOSIS — G459 Transient cerebral ischemic attack, unspecified: Principal | ICD-10-CM

## 2024-03-01 DIAGNOSIS — K5730 Diverticulosis of large intestine without perforation or abscess without bleeding: Secondary | ICD-10-CM | POA: Diagnosis not present

## 2024-03-01 DIAGNOSIS — R2681 Unsteadiness on feet: Secondary | ICD-10-CM | POA: Insufficient documentation

## 2024-03-01 DIAGNOSIS — G40909 Epilepsy, unspecified, not intractable, without status epilepticus: Secondary | ICD-10-CM | POA: Insufficient documentation

## 2024-03-01 DIAGNOSIS — F341 Dysthymic disorder: Secondary | ICD-10-CM | POA: Insufficient documentation

## 2024-03-01 LAB — COMPREHENSIVE METABOLIC PANEL WITH GFR
ALT: 18 U/L (ref 0–44)
AST: 20 U/L (ref 15–41)
Albumin: 4 g/dL (ref 3.5–5.0)
Alkaline Phosphatase: 65 U/L (ref 38–126)
Anion gap: 8 (ref 5–15)
BUN: 22 mg/dL (ref 8–23)
CO2: 24 mmol/L (ref 22–32)
Calcium: 9 mg/dL (ref 8.9–10.3)
Chloride: 107 mmol/L (ref 98–111)
Creatinine, Ser: 0.79 mg/dL (ref 0.44–1.00)
GFR, Estimated: >60 mL/min (ref >60)
Glucose, Bld: 100 mg/dL — ABNORMAL HIGH (ref 70–99)
Potassium: 3.9 mmol/L (ref 3.5–5.1)
Sodium: 139 mmol/L (ref 135–145)
Total Bilirubin: 0.8 mg/dL (ref 0.0–1.2)
Total Protein: 7.2 g/dL (ref 6.5–8.1)

## 2024-03-01 LAB — URINALYSIS, ROUTINE W REFLEX MICROSCOPIC
Bilirubin Urine: NEGATIVE
Glucose, UA: NEGATIVE mg/dL
Ketones, ur: NEGATIVE mg/dL
Nitrite: NEGATIVE
Protein, ur: NEGATIVE mg/dL
Specific Gravity, Urine: 1.016 (ref 1.005–1.030)
pH: 5 (ref 5.0–8.0)

## 2024-03-01 LAB — CBC
HCT: 41.5 % (ref 36.0–46.0)
Hemoglobin: 14.3 g/dL (ref 12.0–15.0)
MCH: 31 pg (ref 26.0–34.0)
MCHC: 34.5 g/dL (ref 30.0–36.0)
MCV: 89.8 fL (ref 80.0–100.0)
Platelets: 240 10*3/uL (ref 150–400)
RBC: 4.62 MIL/uL (ref 3.87–5.11)
RDW: 11.9 % (ref 11.5–15.5)
WBC: 8.1 10*3/uL (ref 4.0–10.5)
nRBC: 0 % (ref 0.0–0.2)

## 2024-03-01 LAB — LIPASE, BLOOD: Lipase: 29 U/L (ref 11–51)

## 2024-03-01 MED ORDER — MOMETASONE FURO-FORMOTEROL FUM 100-5 MCG/ACT IN AERO
2.0000 | INHALATION_SPRAY | Freq: Two times a day (BID) | RESPIRATORY_TRACT | Status: DC
  Filled 2024-03-01: qty 8.8

## 2024-03-01 MED ORDER — STROKE: EARLY STAGES OF RECOVERY BOOK: Freq: Once | Status: DC

## 2024-03-01 MED ORDER — LORATADINE 10 MG PO TABS
10.0000 mg | ORAL_TABLET | Freq: Every day | ORAL | Status: DC
  Administered 2024-03-01: 10 mg via ORAL
  Filled 2024-03-01 (×2): qty 1

## 2024-03-01 MED ORDER — GABAPENTIN 100 MG PO CAPS
100.0000 mg | ORAL_CAPSULE | Freq: Three times a day (TID) | ORAL | Status: DC
  Administered 2024-03-01 (×2): 100 mg via ORAL
  Filled 2024-03-01 (×3): qty 1

## 2024-03-01 MED ORDER — SODIUM CHLORIDE 0.9 % IV SOLN: 1.0000 g | INTRAVENOUS | Status: DC

## 2024-03-01 MED ORDER — LAMOTRIGINE 25 MG PO TABS
25.0000 mg | ORAL_TABLET | Freq: Two times a day (BID) | ORAL | Status: DC
  Administered 2024-03-01: 25 mg via ORAL
  Filled 2024-03-01 (×2): qty 1

## 2024-03-01 MED ORDER — ONDANSETRON HCL 4 MG/2ML IJ SOLN: 4.0000 mg | Freq: Four times a day (QID) | INTRAMUSCULAR | Status: DC | PRN

## 2024-03-01 MED ORDER — OXYCODONE HCL 5 MG PO TABS: 5.0000 mg | ORAL_TABLET | ORAL | Status: DC | PRN

## 2024-03-01 MED ORDER — SENNOSIDES-DOCUSATE SODIUM 8.6-50 MG PO TABS: 1.0000 | ORAL_TABLET | Freq: Every evening | ORAL | Status: DC | PRN

## 2024-03-01 MED ORDER — ACETAMINOPHEN 650 MG RE SUPP: 650.0000 mg | RECTAL | Status: DC | PRN

## 2024-03-01 MED ORDER — ATORVASTATIN CALCIUM 20 MG PO TABS
10.0000 mg | ORAL_TABLET | Freq: Every day | ORAL | Status: DC
  Administered 2024-03-01: 10 mg via ORAL
  Filled 2024-03-01 (×2): qty 1

## 2024-03-01 MED ORDER — SODIUM CHLORIDE 0.9% FLUSH: 3.0000 mL | INTRAVENOUS | Status: DC | PRN

## 2024-03-01 MED ORDER — DICYCLOMINE HCL 10 MG PO CAPS
10.0000 mg | ORAL_CAPSULE | Freq: Three times a day (TID) | ORAL | Status: DC
  Administered 2024-03-01: 10 mg via ORAL
  Filled 2024-03-01 (×2): qty 1

## 2024-03-01 MED ORDER — ACETAMINOPHEN 325 MG PO TABS: 650.0000 mg | ORAL_TABLET | ORAL | Status: DC | PRN

## 2024-03-01 MED ORDER — DULOXETINE HCL 30 MG PO CPEP
30.0000 mg | ORAL_CAPSULE | Freq: Every day | ORAL | Status: DC
  Filled 2024-03-01: qty 1

## 2024-03-01 MED ORDER — TAMSULOSIN HCL 0.4 MG PO CAPS
0.4000 mg | ORAL_CAPSULE | Freq: Every day | ORAL | Status: DC
  Administered 2024-03-01: 0.4 mg via ORAL
  Filled 2024-03-01 (×2): qty 1

## 2024-03-01 MED ORDER — ONDANSETRON 4 MG PO TBDP
4.0000 mg | ORAL_TABLET | ORAL | Status: AC
  Administered 2024-03-01: 4 mg via ORAL
  Filled 2024-03-01: qty 1

## 2024-03-01 MED ORDER — METOPROLOL SUCCINATE ER 50 MG PO TB24
50.0000 mg | ORAL_TABLET | Freq: Every day | ORAL | Status: DC
  Filled 2024-03-01: qty 1

## 2024-03-01 MED ORDER — SODIUM CHLORIDE 0.9% FLUSH: 3.0000 mL | Freq: Two times a day (BID) | INTRAVENOUS | Status: DC

## 2024-03-01 MED ORDER — ZOLPIDEM TARTRATE 5 MG PO TABS
2.5000 mg | ORAL_TABLET | Freq: Every evening | ORAL | Status: DC | PRN
  Administered 2024-03-01: 5 mg via ORAL
  Filled 2024-03-01: qty 1

## 2024-03-01 MED ORDER — AMLODIPINE BESYLATE 5 MG PO TABS
2.5000 mg | ORAL_TABLET | Freq: Every day | ORAL | Status: DC
  Administered 2024-03-01: 2.5 mg via ORAL
  Filled 2024-03-01: qty 1

## 2024-03-01 MED ORDER — OXYCODONE-ACETAMINOPHEN 5-325 MG PO TABS
1.0000 | ORAL_TABLET | ORAL | Status: AC
  Administered 2024-03-01: 1 via ORAL
  Filled 2024-03-01: qty 1

## 2024-03-01 MED ORDER — ENOXAPARIN SODIUM 40 MG/0.4ML IJ SOSY
40.0000 mg | PREFILLED_SYRINGE | INTRAMUSCULAR | Status: DC
  Administered 2024-03-01: 40 mg via SUBCUTANEOUS
  Filled 2024-03-01: qty 0.4

## 2024-03-01 MED ORDER — IRBESARTAN 150 MG PO TABS
300.0000 mg | ORAL_TABLET | Freq: Every day | ORAL | Status: DC
  Filled 2024-03-01: qty 2

## 2024-03-01 MED ORDER — LACTATED RINGERS IV SOLN: INTRAVENOUS | Status: DC

## 2024-03-01 MED ORDER — SODIUM CHLORIDE 0.9 % IV SOLN
1.0000 g | Freq: Once | INTRAVENOUS | Status: AC
  Administered 2024-03-01: 1 g via INTRAVENOUS
  Filled 2024-03-01: qty 10

## 2024-03-01 MED ORDER — ACETAMINOPHEN 160 MG/5ML PO SOLN
650.0000 mg | ORAL | Status: DC | PRN
  Filled 2024-03-01: qty 20.3

## 2024-03-01 NOTE — ED Triage Notes (Signed)
 First Nurse Note;  Pt c/o stroke like symptoms. Per husband, pt had a sudden onset of confusion and lethargy at 11:30 today. Pt has no drift or weakness in any extremities. Denies numbness  Bilateral grip strength equal.

## 2024-03-01 NOTE — ED Provider Notes (Signed)
 [facility] Provider Note    Event Date/Time   First MD Initiated Contact with Patient 03/01/24 [phone]     (approximate)   History   Abdominal Pain and Weakness   HPI  Julia Cooke is a 73 year old female with history of HTN presenting to the ER for evaluation of resolved weakness and flank pain.   Patient was at her baseline when she woke up this morning.  Around 1130 they were eating breakfast and has been noticed that she was having difficulty with coordinating using her fork to eat.  When they stood up to go home he noticed that she had an unsteady gait and was repeatedly leaning to the left.  No falls or syncope.  Last known well around 11 AM.  No associated speech changes, unilateral weakness, numbness, tingling.  They initially presented to the walk-in clinic, or directed to the ER.  Report that symptoms resolved around the time they checked in at approximately 12:45 AM.  Separately, patient reports that a short while ago she developed pain in her right flank.  Feels similar to when she has had kidney stones in the past.  Has associated nausea with 3 episodes of vomiting.  No fevers.  Received Zofran and Percocet in triage and reports significant improvement currently.    Physical Exam   Triage Vital Signs: ED Triage Vitals  Encounter Vitals Group     BP 03/01/24 1248 (!) 164/88     Systolic BP Percentile --      Diastolic BP Percentile --      Pulse Rate 03/01/24 1248 87     Resp 03/01/24 1248 16     Temp 03/01/24 1248 98 F (36.7 C)     Temp Source 03/01/24 1248 Oral     SpO2 03/01/24 1248 99 %     Weight 03/01/24 1248 181 lb 12.8 oz (82.5 kg)     Height 03/01/24 1248 5' (1.524 m)     Head Circumference --      Peak Flow --      Pain Score 03/01/24 1304 7     Pain Loc --      Pain Education --      Exclude from Growth Chart --     Most recent vital signs: Vitals:   03/01/24 1248 03/01/24 1500  BP: (!) 164/88 (!) 153/77  Pulse: 87 89   Resp: 16 17  Temp: 98 F (36.7 C) 98.1 F (36.7 C)  SpO2: 99% 94%     General: Awake, interactive  CV:  Regular rate, good peripheral perfusion.  Resp:  Unlabored respirations, lungs clear to auscultation Abd:  Nondistended, soft, mild tenderness over the right side of the abdomen, remainder of abdomen nontender Neuro:  Keenly aware, correctly answers month and age, able to blink eyes and squeeze hands, normal horizontal extraocular movements, no visual field loss, normal facial symmetry, no arm or leg motor drift, no limb ataxia, normal sensation, no aphasia, no dysarthria, no inattention.  Ambulated with steady gait, slight preference to use her right side, but both patient and husband report that this is not new and she is currently back to her baseline ambulation.  NIH 0.   ED Results / Procedures / Treatments   Labs (all labs ordered are listed, but only abnormal results are displayed) Labs Reviewed  COMPREHENSIVE METABOLIC PANEL WITH GFR - Abnormal; Notable for the following components:      Result Value   Glucose, Bld 100 (*)  All other components within normal limits  URINALYSIS, ROUTINE W REFLEX MICROSCOPIC - Abnormal; Notable for the following components:   Color, Urine YELLOW (*)    APPearance CLEAR (*)    Hgb urine dipstick MODERATE (*)    Leukocytes,Ua MODERATE (*)    Bacteria, UA RARE (*)    Non Squamous Epithelial PRESENT (*)    All other components within normal limits  URINE CULTURE  LIPASE, BLOOD  CBC     EKG EKG independently reviewed interpreted by myself (ER attending) demonstrates:  EKG demonstrates normal sinus rhythm rate of 89, PR 162, QRS 116, QTc 425, no acute ST changes  RADIOLOGY Imaging independently reviewed and interpreted by myself demonstrates:  CT head without acute bleed CT stone study demonstrates a 4 mm calculus at the right UVJ with associated hydronephrosis  PROCEDURES:  Critical Care performed:  No  Procedures   MEDICATIONS ORDERED IN ED: Medications  cefTRIAXone (ROCEPHIN) 1 g in sodium chloride 0.9 % 100 mL IVPB (has no administration in time range)  ondansetron (ZOFRAN-ODT) disintegrating tablet 4 mg (4 mg Oral Given 03/01/24 1327)  oxyCODONE-acetaminophen (PERCOCET/ROXICET) 5-325 MG per tablet 1 tablet (1 tablet Oral Given 03/01/24 1326)     IMPRESSION / MDM / ASSESSMENT AND PLAN / ED COURSE  I reviewed the triage vital signs and the nursing notes.  Differential diagnosis includes, but is not limited to, CVA, TIA, renal stone, UTI, electrolyte abnormality  Patient's presentation is most consistent with acute presentation with potential threat to life or bodily function.  73 year old female presenting with resolved episode of impaired coordination and ongoing flank pain.  Stable vitals on presentation.  Labs overall reassuring.  Urine equivocal for infection with 11-20 white blood cells, rare bacteria, moderate leukocyte esterase.  CT head ordered from triage without acute findings.  CT renal stone study does demonstrate an obstructing right-sided renal stone.  With equivocal urine, case was discussed with Dr. Lonna Cobb.  He did recommend sending culture and starting on antibiotics, but did not feel patient currently needed intervention.  I did note likely admission for TIA, did feel IV antibiotics while admitted was reasonable, but did note that patient could could transition to oral antibiotics at any point.  Regarding her episode of impaired coordination and gait, clinical history is concerning for TIA.  She presented within the thrombolytic window, but was with resolved symptoms on presentation and continued to remain without recurrent neurologic symptoms while here.  ABCD 2 score 4.  With this, I did discuss possible admission for further evaluation.  Patient is agreeable.  Will reach out to hospitalist team. Clinical Course as of 03/01/24 1709  Thu Mar 01, 2024  1707 Reviewed  with hospitalist team.  They will evaluate for anticipated admission. [NR]    Clinical Course User Index [NR] Trinna Post, MD     FINAL CLINICAL IMPRESSION(S) / ED DIAGNOSES   Final diagnoses:  Impaired coordination of upper extremity  Gait instability  Renal colic on right side  Acute cystitis without hematuria     Rx / DC Orders   ED Discharge Orders     None        Note:  This document was prepared using Dragon voice recognition software and may include unintentional dictation errors.   Trinna Post, MD 03/01/24 [phone]

## 2024-03-01 NOTE — H&P (Signed)
 History and Physical    Julia Cooke UUV:[id number] DOB: November 25, 1951 DOA: 03/01/2024  PCP: Julia Shams, MD (Confirm with patient/family/NH records and if not entered, this has to be entered at [facility] point of entry) Patient coming from: Home  I have personally briefly reviewed patient's old medical records in [facility] Link  Chief Complaint: Transient upper extremity weakness, word finding difficulties, flank pain  HPI: Julia Cooke is a 73 y.o. female with medical history significant of hypertension presents to the ED for evaluation of acute episode of coordination deficit, upper extremity weakness, unsteady gait.  No evidence of fall or syncope.  Resolved to baseline spontaneously and approximately 1 hour.  Patient states that this is never happened to her before.  She has poor recollection for the event.  History obtained from patient's spouse at bedside.  Spouse said he has had TIAs and was concerned about this possibility and brought her to the ER for evaluation.  On presentation in the ER vital signs are stable.  NIH score is 0.  Patient developed right flank pain while in the ER.  States that it was similar to previous episodes of nephrolithiasis.  Also had associated nausea with vomiting.  No fevers.  CT renal stone study demonstrated a right obstructive stone 4 mm in diameter in the right ureter.  Mild hydronephrosis is resolved.  EDP consulted with urology who did not recommend any intervention at this time.  ED Course: CT head unrevealing.  EDP spoke with urology who recommended no intervention.  Started Rocephin, fluids, pain control, nausea control.  Hospitalist contacted for admission.  Review of Systems: As per HPI otherwise 14 point review of systems negative.   Past Medical History:  Diagnosis Date   Bipolar disorder (HCC)    takes Abilify   Depression    Dysrhythmia 10/2016   LBBB on ekg...nothing to compare it to.   Heart murmur    Hypertension     Nephrolithiasis 2000   s/p lithotripsy   Psychotic disorder (HCC)    mania   Wears contact lenses     Past Surgical History:  Procedure Laterality Date   ARTHRODESIS METATARSALPHALANGEAL JOINT (MTPJ) Left 03/09/2023   Procedure: ARTHRODESIS METATARSALPHALANGEAL JOINT (MTPJ);  Surgeon: Julia Cooke, DPM;  Location: [facility];  Service: Podiatry;  Laterality: Left;   CAPSULOTOMY METATARSOPHALANGEAL Left 03/09/2023   Procedure: CAPSULOTOMY METATARSOPHALANGEAL SECOND;  Surgeon: Julia Cooke, DPM;  Location: [facility];  Service: Podiatry;  Laterality: Left;   EXTRACORPOREAL SHOCK WAVE LITHOTRIPSY Left 01/06/2017   Procedure: EXTRACORPOREAL SHOCK WAVE LITHOTRIPSY (ESWL);  Surgeon: Julia Scotland, MD;  Location: [facility];  Service: Urology;  Laterality: Left;   FLEXOR TENOTOMY  Left 03/09/2023   Procedure: FLEXOR TENOTOMY;  Surgeon: Julia Cooke, DPM;  Location: [facility];  Service: Podiatry;  Laterality: Left;   LITHOTRIPSY  2000     reports that she has never smoked. She has never used smokeless tobacco. She reports current alcohol use. She reports that she does not use drugs.  Allergies  Allergen Reactions   Penicillins Shortness Of Breath    As a child    Family History  Problem Relation Age of Onset   Hyperlipidemia Mother    Stroke Mother    Hypertension Mother    Hyperlipidemia Father    Hypertension Father    Cancer Sister        breast   Breast cancer Sister 20   Fibromyalgia Sister    Hypertension Sister  Heart Problems Sister    Hypertension Sister    Heart disease Brother        myocardial infarction   Heart attack Brother    Hypertension Brother    Prostate cancer Neg Hx    Kidney cancer Neg Hx    Bladder Cancer Neg Hx    No family history of stroke or TIA  Prior to Admission medications   Medication Sig Start Date End Date Taking? Authorizing Provider  amLODipine (NORVASC) 2.5 MG tablet TAKE 1 TABLET BY MOUTH AT BEDTIME  01/02/24  Yes Julia Shams, MD  atorvastatin (LIPITOR) 10 MG tablet Take 1 tablet (10 mg total) by mouth daily. 01/07/23  Yes Julia Ouch, MD  Cyanocobalamin (VITAMIN B-12 PO) Take by mouth daily.   Yes [provider]  DULoxetine (CYMBALTA) 30 MG capsule Take 1 capsule (30 mg total) by mouth daily. No future refills without keeping appointment. 01/30/24 04/29/24 Yes Julia Longs, MD  gabapentin (NEURONTIN) 100 MG capsule Take 1 capsule (100 mg total) by mouth 3 (three) times daily. 11/07/23  Yes Julia Shams, MD  irbesartan (AVAPRO) 300 MG tablet Take 1 tablet (300 mg total) by mouth daily. 01/30/24  Yes Cooke, Julia Mutton, PA-C  lamoTRIgine (LAMICTAL) 25 MG tablet Take 1 tablet (25 mg total) by mouth 2 (two) times daily. 05/12/23  Yes Julia Longs, MD  metoprolol succinate (TOPROL-XL) 50 MG 24 hr tablet Take 1 tablet (50 mg total) by mouth daily with or immediately following a meal. 01/18/23  Yes Cooke, Julia Mutton, PA-C  zolpidem (AMBIEN) 5 MG tablet Take 0.5-1 tablets (2.5-5 mg total) by mouth at bedtime as needed for sleep. 09/08/23  Yes Julia Longs, MD  budesonide-formoterol (SYMBICORT) 80-4.5 MCG/ACT inhaler Inhale 2 puffs into the lungs 2 (two) times daily. 08/10/23   Julia Grana, FNP  dicyclomine (BENTYL) 10 MG capsule Take 1 capsule (10 mg total) by mouth 4 (four) times daily -  before meals and at bedtime. 12/02/23   Julia Shams, MD  loratadine (CLARITIN) 10 MG tablet Take 1 tablet (10 mg total) by mouth daily. 08/10/23   Julia Grana, FNP  melatonin 5 MG TABS Take 5 mg by mouth.    [provider]  Multiple Vitamin (MULTIVITAMIN) tablet Take 1 tablet by mouth daily.    [provider]  Spacer/Aero-Holding Chambers DEVI 1 Units by Does not apply route daily. 08/10/23   Julia Grana, FNP  omeprazole (PRILOSEC) 20 MG capsule Take 1 capsule (20 mg total) by mouth daily. 05/27/23 09/19/23  Julia Shams, MD    Physical Exam: Vitals:   03/01/24  1248 03/01/24 1304 03/01/24 1500  BP: (!) 164/88  (!) 153/77  Pulse: 87  89  Resp: 16  17  Temp: 98 F (36.7 C)  98.1 F (36.7 C)  TempSrc: Oral  Oral  SpO2: 99%  94%  Weight: 82.5 kg 81.6 kg   Height: 5' (1.524 m) 5' (1.524 m)     General: No apparent distress, patient appears well HEENT: Normocephalic, atraumatic Neck, supple, trachea midline, no tenderness Heart: Regular rate and rhythm, S1/S2 normal, no murmurs Lungs: Clear to auscultation bilaterally, no adventitious sounds, normal work of breathing Abdomen: Obese, soft, nondistended.  Pain in right flank, normal bowel sounds Extremities: Normal, atraumatic, no clubbing or cyanosis, normal muscle tone Skin: No rashes or lesions, normal color Neurologic: Cranial nerves grossly intact, sensation intact, alert and oriented x3 Psychiatric: Normal affect   Labs on  Admission: I have personally reviewed following labs and imaging studies  CBC: Recent Labs  Lab 03/01/24 1332  WBC 8.1  HGB 14.3  HCT 41.5  MCV 89.8  PLT 240   Basic Metabolic Panel: Recent Labs  Lab 03/01/24 1332  NA 139  K 3.9  CL 107  CO2 24  GLUCOSE 100*  BUN 22  CREATININE 0.79  CALCIUM 9.0   GFR: Estimated Creatinine Clearance: 60.1 mL/min (by C-G formula based on SCr of 0.79 mg/dL). Liver Function Tests: Recent Labs  Lab 03/01/24 1332  AST 20  ALT 18  ALKPHOS 65  BILITOT 0.8  PROT 7.2  ALBUMIN 4.0   Recent Labs  Lab 03/01/24 1332  LIPASE 29   No results for input(s): "AMMONIA" in the last 168 hours. Coagulation Profile: No results for input(s): "INR", "PROTIME" in the last 168 hours. Cardiac Enzymes: No results for input(s): "CKTOTAL", "CKMB", "CKMBINDEX", "TROPONINI" in the last 168 hours. BNP (last 3 results) No results for input(s): "PROBNP" in the last 8760 hours. HbA1C: No results for input(s): "HGBA1C" in the last 72 hours. CBG: No results for input(s): "GLUCAP" in the last 168 hours. Lipid Profile: No results for  input(s): "CHOL", "HDL", "LDLCALC", "TRIG", "CHOLHDL", "LDLDIRECT" in the last 72 hours. Thyroid Function Tests: No results for input(s): "TSH", "T4TOTAL", "FREET4", "T3FREE", "THYROIDAB" in the last 72 hours. Anemia Panel: No results for input(s): "VITAMINB12", "FOLATE", "FERRITIN", "TIBC", "IRON", "RETICCTPCT" in the last 72 hours. Urine analysis:    Component Value Date/Time   COLORURINE YELLOW (A) 03/01/2024 1351   APPEARANCEUR CLEAR (A) 03/01/2024 1351   APPEARANCEUR Clear 12/17/2019 1551   LABSPEC 1.016 03/01/2024 1351   LABSPEC 1.003 02/02/2013 1331   PHURINE 5.0 03/01/2024 1351   GLUCOSEU NEGATIVE 03/01/2024 1351   GLUCOSEU NEGATIVE 11/18/2014 1135   HGBUR MODERATE (A) 03/01/2024 1351   BILIRUBINUR NEGATIVE 03/01/2024 1351   BILIRUBINUR Negative 12/17/2019 1551   BILIRUBINUR Negative 02/02/2013 1331   KETONESUR NEGATIVE 03/01/2024 1351   PROTEINUR NEGATIVE 03/01/2024 1351   UROBILINOGEN 0.2 11/18/2014 1135   UROBILINOGEN 0.2 11/18/2014 1134   NITRITE NEGATIVE 03/01/2024 1351   LEUKOCYTESUR MODERATE (A) 03/01/2024 1351   LEUKOCYTESUR 1+ 02/02/2013 1331    Radiological Exams on Admission: CT HEAD WO CONTRAST ( ) Result Date: 03/01/2024 CLINICAL DATA:  TIA.  Confusion and lethargy. EXAM: CT HEAD WITHOUT CONTRAST TECHNIQUE: Contiguous axial images were obtained from the base of the skull through the vertex without intravenous contrast. RADIATION DOSE REDUCTION: This exam was performed according to the departmental dose-optimization program which includes automated exposure control, adjustment of the mA and/or kV according to patient size and/or use of iterative reconstruction technique. COMPARISON:  Head CT 02/02/2013 FINDINGS: Brain: No evidence of acute infarction, hemorrhage, hydrocephalus, extra-axial collection or mass lesion/mass effect. Vascular: No hyperdense vessel or unexpected calcification. Skull: Normal. Negative for fracture or focal lesion. Sinuses/Orbits: No acute  finding. Other: None. IMPRESSION: No acute intracranial abnormality. Electronically Signed   By: Darliss Cheney M.D.   On: 03/01/2024 15:14   CT Renal Stone Study Result Date: 03/01/2024 CLINICAL DATA:  Right-sided flank pain. EXAM: CT ABDOMEN AND PELVIS WITHOUT CONTRAST TECHNIQUE: Multidetector CT imaging of the abdomen and pelvis was performed following the standard protocol without IV contrast. RADIATION DOSE REDUCTION: This exam was performed according to the departmental dose-optimization program which includes automated exposure control, adjustment of the mA and/or kV according to patient size and/or use of iterative reconstruction technique. COMPARISON:  CT abdomen/pelvis dated 10/02/2019. FINDINGS: Lower  chest: No acute abnormality. Hepatobiliary: No focal liver abnormality is seen. No gallstones, gallbladder wall thickening, or biliary dilatation. Pancreas: Unremarkable. No pancreatic ductal dilatation or surrounding inflammatory changes. Spleen: Normal in size without focal abnormality. Adrenals/Urinary Tract: Adrenal glands are unremarkable. There is a 4 mm calculus at the right ureterovesicular junction (3:79), with moderate-to-severe right hydroureteronephrosis. Additional nonobstructing punctate calculi in the right kidney. Several nonobstructing left-sided calculi measuring up to 4 mm. No left-sided ureteral calculi. No left-sided hydronephrosis. Stomach/Bowel: Stomach is within normal limits. Appendix appears normal. No evidence of bowel wall thickening, distention, or inflammatory changes. A few sigmoid colonic diverticula without evidence of acute diverticulitis. Vascular/Lymphatic: Aortic atherosclerosis. No enlarged abdominal or pelvic lymph nodes. Reproductive: Uterus and bilateral adnexa are unremarkable. Other: No abdominal wall hernia or abnormality. No abdominopelvic ascites. No intraperitoneal free air. Musculoskeletal: No acute osseous abnormality. No suspicious osseous lesion. Multilevel  degenerative changes of the thoracolumbar spine. IMPRESSION: 1. 4 mm calculus at the right UVJ with moderate-to-severe right hydroureteronephrosis. 2. Additional bilateral nonobstructing renal calculi. 3. A few sigmoid colonic diverticula without evidence of acute diverticulitis. Electronically Signed   By: Hart Robinsons M.D.   On: 03/01/2024 15:09    EKG: Independently reviewed.  Normal sinus rhythm  Assessment/Plan Principal Problem:   Transient ischemic attack  Transient ischemic attack Patient with spontaneously resolving strokelike symptoms including upper extremity weakness, dysarthria, word finding difficulties, confusion.  Resolved spontaneously within 1 hour.  Suspect TIA. Plan: Place in observation MRI brain without contrast PT OT SLP evaluations Defer echocardiogram at this time Anticipate discharge 4/4 if imaging findings are reassuring  Obstructive nephrolithiasis 4 mm stone noted in the right ureter.  Likely underlying flank pain. EDP discussed with urology.  No intervention recommended Plan: IV fluids Pain control Flomax Empiric antibiotics Follow urine culture  Hypertension Resume home regimen  Hyperlipidemia Statin  Seizure disorder Lamictal per home dose  Depression Cymbalta  DVT prophylaxis: Lovenox Code Status: Full Family Communication: Spouse at bedside Disposition Plan: Anticipate return to previous home environment Consults called: None Admission status: Observation, MedSurg   Tresa Moore MD [facility]   If 7PM-7AM, please contact night-coverage   03/01/2024, 5:20 PM

## 2024-03-01 NOTE — ED Triage Notes (Signed)
 Patient arrives POV with husband from [place] clinic; ED provider Greig Right assessing patient in triage. Patient reports weakness, abdominal pain, and nausea that started today, prior to arrival to the ED. Patient is a&o x4 and is able to answer orientation questions. Patient also reports right sided flank pain; patient has hx of kidney stones.

## 2024-03-01 NOTE — ED Provider Triage Note (Signed)
 Emergency Medicine Provider Triage Evaluation Note  Julia Cooke , a 73 y.o. female  was evaluated in triage.  Pt complains of flank pain.,  Husband states he wondered if she had a TIA at breakfast but has no strokelike symptoms now.  Patient started vomiting in triage.  States pain is in the right flank.  Has history of kidney problems  Review of Systems  Positive:  Negative:   Physical Exam  BP (!) 164/88 (BP Location: Right Arm)   Pulse 87   Temp 98 F (36.7 C) (Oral)   Resp 16   Ht 5' (1.524 m)   Wt 82.5 kg   SpO2 99%   BMI 35.51 kg/m  Gen:   Awake, no distress   Resp:  Normal effort  MSK:   Moves extremities without difficulty  Other:    Medical Decision Making  Medically screening exam initiated at 1:00 PM.  Appropriate orders placed.  Ann Sha Burling was informed that the remainder of the evaluation will be completed by another provider, this initial triage assessment does not replace that evaluation, and the importance of remaining in the ED until their evaluation is complete.  Am not calling code stroke as patient has no strokelike symptoms now.  However we will go ahead and order CT of the head due to husband's concerns of TIA   Faythe Ghee, PA-C 03/01/24 1301

## 2024-03-02 ENCOUNTER — Other Ambulatory Visit: Payer: Self-pay

## 2024-03-02 DIAGNOSIS — G459 Transient cerebral ischemic attack, unspecified: Secondary | ICD-10-CM | POA: Diagnosis not present

## 2024-03-02 LAB — URINE CULTURE: Culture: <10000 — AB

## 2024-03-02 LAB — LIPID PANEL
Cholesterol: 126 mg/dL (ref 0–200)
HDL: 36 mg/dL — ABNORMAL LOW (ref >40)
LDL Cholesterol: 68 mg/dL (ref 0–99)
Total CHOL/HDL Ratio: 3.5 ratio
Triglycerides: 110 mg/dL (ref <150)
VLDL: 22 mg/dL (ref 0–40)

## 2024-03-02 MED ORDER — ASPIRIN 81 MG PO TBEC: 81.0000 mg | DELAYED_RELEASE_TABLET | Freq: Every day | ORAL | Status: AC

## 2024-03-02 MED ORDER — SODIUM CHLORIDE 0.9 % IV SOLN
1.0000 g | Freq: Once | INTRAVENOUS | Status: AC
  Administered 2024-03-02: 1 g via INTRAVENOUS
  Filled 2024-03-02: qty 10

## 2024-03-02 MED ORDER — CIPROFLOXACIN HCL 500 MG PO TABS
500.0000 mg | ORAL_TABLET | Freq: Two times a day (BID) | ORAL | 0 refills | Status: AC
  Filled 2024-03-02: qty 2, 1d supply, fill #0

## 2024-03-02 MED ORDER — TAMSULOSIN HCL 0.4 MG PO CAPS
0.4000 mg | ORAL_CAPSULE | Freq: Every day | ORAL | 0 refills | Status: AC
Start: 2024-03-02 — End: 2024-03-09
  Filled 2024-03-02: qty 7, 7d supply, fill #0

## 2024-03-02 NOTE — Progress Notes (Signed)
 OT Cancellation Note  Patient Details Name: Julia Cooke MRN: [id number] DOB: 10-09-1951   Cancelled Treatment:    Reason Eval/Treat Not Completed: Other (comment);OT screened, no needs identified, will sign off (per discussion with PT, pt is back to baseline, no OT concerns at this time. OT will sign off. Please re consult if there is a change in functional status.)  Oleta Mouse, OTD OTR/L  03/02/24, 8:54 AM

## 2024-03-02 NOTE — Discharge Summary (Signed)
 Physician Discharge Summary  Julia Cooke ZOX:[id number] DOB: Jul 20, 1951 DOA: 03/01/2024  PCP: Sherlene Shams, MD  Admit date: 03/01/2024 Discharge date: 03/02/2024  Admitted From: Home Disposition:  Home  Recommendations for Outpatient Follow-up:  Follow up with PCP in 1-2 weeks Ambulatory referral to neurology  Home Health: No Equipment/Devices: None  Discharge Condition: Stable CODE STATUS: Full Diet recommendation: Heart healthy  Brief/Interim Summary:  Transient ischemic attack Patient with spontaneously resolving strokelike symptoms including upper extremity weakness, dysarthria, word finding difficulties, confusion.  Resolved spontaneously within 1 hour.  Suspect TIA.  MRI brain reassuring.  Possible hyperintensity consistent with focus of migraine.  Patient denies any headache. Plan: Suspect TIA.  Unable to totally exclude a complicated migraine.  Symptoms have resolved and patient is back to neurologic baseline.  Seen in consultation by PT and OT.  Back to baseline.  No follow-up recommended.  Defer echocardiogram at this time.  Discharge home.  Recommend daily aspirin 81 mg.  Ambulatory referral to [facility] neurology.  Follow-up outpatient PCP.  Obstructive nephrolithiasis 4 mm stone noted in the right ureter.  Likely underlying flank pain. EDP discussed with urology.  No intervention recommended Plan: 3-day course of empiric antibiotics.  Recommend for 7-day course of Flomax.  Recommend adequate hydration.  Urine culture with no growth to date.  Follow-up PCP.  Consider outpatient referral to urology should flank pain persist or worsen.    Discharge Diagnoses:  Principal Problem:   Transient ischemic attack    Discharge Instructions  Discharge Instructions     Ambulatory referral to Neurology   Complete by: As directed    Diet - low sodium heart healthy   Complete by: As directed    Increase activity slowly   Complete by: As directed       Allergies as of  03/02/2024       Reactions   Penicillins Shortness Of Breath   As a child        Medication List     STOP taking these medications    budesonide-formoterol 80-4.5 MCG/ACT inhaler Commonly known as: SYMBICORT       TAKE these medications    amLODipine 2.5 MG tablet Commonly known as: NORVASC TAKE 1 TABLET BY MOUTH AT BEDTIME   aspirin EC 81 MG tablet Take 1 tablet (81 mg total) by mouth daily. Swallow whole.   atorvastatin 10 MG tablet Commonly known as: LIPITOR Take 1 tablet (10 mg total) by mouth daily.   ciprofloxacin 500 MG tablet Commonly known as: Cipro Take 1 tablet (500 mg total) by mouth 2 (two) times daily for 1 day. Can be taken 4/5   dicyclomine 10 MG capsule Commonly known as: BENTYL Take 1 capsule (10 mg total) by mouth 4 (four) times daily -  before meals and at bedtime.   DULoxetine 30 MG capsule Commonly known as: Cymbalta Take 1 capsule (30 mg total) by mouth daily. No future refills without keeping appointment.   gabapentin 100 MG capsule Commonly known as: NEURONTIN Take 1 capsule (100 mg total) by mouth 3 (three) times daily.   irbesartan 300 MG tablet Commonly known as: Avapro Take 1 tablet (300 mg total) by mouth daily.   lamoTRIgine 25 MG tablet Commonly known as: LAMICTAL Take 1 tablet (25 mg total) by mouth 2 (two) times daily.   loratadine 10 MG tablet Commonly known as: CLARITIN Take 1 tablet (10 mg total) by mouth daily.   melatonin 5 MG Tabs Take 5 mg by mouth.  metoprolol succinate 50 MG 24 hr tablet Commonly known as: TOPROL-XL Take 1 tablet (50 mg total) by mouth daily with or immediately following a meal.   multivitamin tablet Take 1 tablet by mouth daily.   Spacer/Aero-Holding [employer] 1 Units by Does not apply route daily.   tamsulosin 0.4 MG Caps capsule Commonly known as: FLOMAX Take 1 capsule (0.4 mg total) by mouth daily for 7 days.   VITAMIN B-12 PO Take by mouth daily.   zolpidem 5 MG  tablet Commonly known as: AMBIEN Take 0.5-1 tablets (2.5-5 mg total) by mouth at bedtime as needed for sleep.        Allergies  Allergen Reactions   Penicillins Shortness Of Breath    As a child    Consultations: None   Procedures/Studies: MR BRAIN WO CONTRAST Result Date: 03/01/2024 CLINICAL DATA:  Left-sided weakness.  Transient ischemic attack. EXAM: MRI HEAD WITHOUT CONTRAST TECHNIQUE: Multiplanar, multiecho pulse sequences of the brain and surrounding structures were obtained without intravenous contrast. COMPARISON:  None Available. FINDINGS: Brain: No acute infarct, mass effect or extra-axial collection. No acute or chronic hemorrhage. Minimal multifocal hyperintense T2-weight signal within the white matter. The midline structures are normal. Vascular: Normal flow voids. Skull and upper cervical spine: Normal calvarium and skull base. Visualized upper cervical spine and soft tissues are normal. Sinuses/Orbits:No paranasal sinus fluid levels or advanced mucosal thickening. No mastoid or middle ear effusion. Normal orbits. IMPRESSION: 1. No acute intracranial abnormality. 2. Minimal multifocal hyperintense T2-weight signal within the white matter, nonspecific but may be seen in the setting of migraine headaches or early chronic small vessel ischemia. Electronically Signed   By: Deatra Robinson M.D.   On: 03/01/2024 19:47   CT HEAD WO CONTRAST ( ) Result Date: 03/01/2024 CLINICAL DATA:  TIA.  Confusion and lethargy. EXAM: CT HEAD WITHOUT CONTRAST TECHNIQUE: Contiguous axial images were obtained from the base of the skull through the vertex without intravenous contrast. RADIATION DOSE REDUCTION: This exam was performed according to the departmental dose-optimization program which includes automated exposure control, adjustment of the mA and/or kV according to patient size and/or use of iterative reconstruction technique. COMPARISON:  Head CT 02/02/2013 FINDINGS: Brain: No evidence of acute  infarction, hemorrhage, hydrocephalus, extra-axial collection or mass lesion/mass effect. Vascular: No hyperdense vessel or unexpected calcification. Skull: Normal. Negative for fracture or focal lesion. Sinuses/Orbits: No acute finding. Other: None. IMPRESSION: No acute intracranial abnormality. Electronically Signed   By: Darliss Cheney M.D.   On: 03/01/2024 15:14   CT Renal Stone Study Result Date: 03/01/2024 CLINICAL DATA:  Right-sided flank pain. EXAM: CT ABDOMEN AND PELVIS WITHOUT CONTRAST TECHNIQUE: Multidetector CT imaging of the abdomen and pelvis was performed following the standard protocol without IV contrast. RADIATION DOSE REDUCTION: This exam was performed according to the departmental dose-optimization program which includes automated exposure control, adjustment of the mA and/or kV according to patient size and/or use of iterative reconstruction technique. COMPARISON:  CT abdomen/pelvis dated 10/02/2019. FINDINGS: Lower chest: No acute abnormality. Hepatobiliary: No focal liver abnormality is seen. No gallstones, gallbladder wall thickening, or biliary dilatation. Pancreas: Unremarkable. No pancreatic ductal dilatation or surrounding inflammatory changes. Spleen: Normal in size without focal abnormality. Adrenals/Urinary Tract: Adrenal glands are unremarkable. There is a 4 mm calculus at the right ureterovesicular junction (3:79), with moderate-to-severe right hydroureteronephrosis. Additional nonobstructing punctate calculi in the right kidney. Several nonobstructing left-sided calculi measuring up to 4 mm. No left-sided ureteral calculi. No left-sided hydronephrosis. Stomach/Bowel: Stomach is within normal limits.  Appendix appears normal. No evidence of bowel wall thickening, distention, or inflammatory changes. A few sigmoid colonic diverticula without evidence of acute diverticulitis. Vascular/Lymphatic: Aortic atherosclerosis. No enlarged abdominal or pelvic lymph nodes. Reproductive: Uterus  and bilateral adnexa are unremarkable. Other: No abdominal wall hernia or abnormality. No abdominopelvic ascites. No intraperitoneal free air. Musculoskeletal: No acute osseous abnormality. No suspicious osseous lesion. Multilevel degenerative changes of the thoracolumbar spine. IMPRESSION: 1. 4 mm calculus at the right UVJ with moderate-to-severe right hydroureteronephrosis. 2. Additional bilateral nonobstructing renal calculi. 3. A few sigmoid colonic diverticula without evidence of acute diverticulitis. Electronically Signed   By: Hart Robinsons M.D.   On: 03/01/2024 15:09      Subjective: Seen and examined on the day of discharge.  Stable no distress.  Appropriate discharge home.  Discharge Exam: Vitals:   03/02/24 0745 03/02/24 0851  BP: 126/67 136/70  Pulse: (!) 106   Resp: (!) 22   Temp: 97.9 F (36.6 C) 98.1 F (36.7 C)  SpO2: 98%    Vitals:   03/02/24 0630 03/02/24 0700 03/02/24 0745 03/02/24 0851  BP: 138/68 (!) 123/59 126/67 136/70  Pulse: 86 94 (!) 106   Resp: 17 14 (!) 22   Temp:   97.9 F (36.6 C) 98.1 F (36.7 C)  TempSrc:   Oral Oral  SpO2: 98% 98% 98%   Weight:      Height:        General: Pt is alert, awake, not in acute distress Cardiovascular: RRR, S1/S2 +, no rubs, no gallops Respiratory: CTA bilaterally, no wheezing, no rhonchi Abdominal: Soft, NT, ND, bowel sounds + Extremities: no edema, no cyanosis    The results of significant diagnostics from this hospitalization (including imaging, microbiology, ancillary and laboratory) are listed below for reference.     Microbiology: No results found for this or any previous visit (from the past 240 hours).   Labs: BNP (last 3 results) No results for input(s): "BNP" in the last 8760 hours. Basic Metabolic Panel: Recent Labs  Lab 03/01/24 1332  NA 139  K 3.9  CL 107  CO2 24  GLUCOSE 100*  BUN 22  CREATININE 0.79  CALCIUM 9.0   Liver Function Tests: Recent Labs  Lab 03/01/24 1332  AST  20  ALT 18  ALKPHOS 65  BILITOT 0.8  PROT 7.2  ALBUMIN 4.0   Recent Labs  Lab 03/01/24 1332  LIPASE 29   No results for input(s): "AMMONIA" in the last 168 hours. CBC: Recent Labs  Lab 03/01/24 1332  WBC 8.1  HGB 14.3  HCT 41.5  MCV 89.8  PLT 240   Cardiac Enzymes: No results for input(s): "CKTOTAL", "CKMB", "CKMBINDEX", "TROPONINI" in the last 168 hours. BNP: Invalid input(s): "POCBNP" CBG: No results for input(s): "GLUCAP" in the last 168 hours. D-Dimer No results for input(s): "DDIMER" in the last 72 hours. Hgb A1c No results for input(s): "HGBA1C" in the last 72 hours. Lipid Profile Recent Labs    03/02/24 0623  CHOL 126  HDL 36*  LDLCALC 68  TRIG 161  CHOLHDL 3.5   Thyroid function studies No results for input(s): "TSH", "T4TOTAL", "T3FREE", "THYROIDAB" in the last 72 hours.  Invalid input(s): "FREET3" Anemia work up No results for input(s): "VITAMINB12", "FOLATE", "FERRITIN", "TIBC", "IRON", "RETICCTPCT" in the last 72 hours. Urinalysis    Component Value Date/Time   COLORURINE YELLOW (A) 03/01/2024 1351   APPEARANCEUR CLEAR (A) 03/01/2024 1351   APPEARANCEUR Clear 12/17/2019 1551   LABSPEC 1.016  03/01/2024 1351   LABSPEC 1.003 02/02/2013 1331   PHURINE 5.0 03/01/2024 1351   GLUCOSEU NEGATIVE 03/01/2024 1351   GLUCOSEU NEGATIVE 11/18/2014 1135   HGBUR MODERATE (A) 03/01/2024 1351   BILIRUBINUR NEGATIVE 03/01/2024 1351   BILIRUBINUR Negative 12/17/2019 1551   BILIRUBINUR Negative 02/02/2013 1331   KETONESUR NEGATIVE 03/01/2024 1351   PROTEINUR NEGATIVE 03/01/2024 1351   UROBILINOGEN 0.2 11/18/2014 1135   UROBILINOGEN 0.2 11/18/2014 1134   NITRITE NEGATIVE 03/01/2024 1351   LEUKOCYTESUR MODERATE (A) 03/01/2024 1351   LEUKOCYTESUR 1+ 02/02/2013 1331   Sepsis Labs Recent Labs  Lab 03/01/24 1332  WBC 8.1   Microbiology No results found for this or any previous visit (from the past 240 hours).   Time coordinating discharge: Over 30  minutes  SIGNED:   Tresa Moore, MD  [facility] 03/02/2024, 11:24 AM Pager   If 7PM-7AM, please contact night-coverage

## 2024-03-02 NOTE — Progress Notes (Signed)
 SLP Cancellation Note  Patient Details Name: Julia Cooke MRN: [id number] DOB: 30-Jan-1951   Cancelled treatment:       Reason Eval/Treat Not Completed: SLP screened, no needs identified, will sign off (chart reviewed; consulted MD)  Pt is on a regular diet w/no reported difficulty swallowing and is swallowing pills w/ water per NSG. Pt converses in conversation w/out expressive/receptive deficits noted per MD. No speech deficits. . No further skilled ST services indicated as pt appears at her communication baseline. NSG to reconsult if any change in status while admitted.     Jerilynn Som, MS, CCC-SLP Speech Language Pathologist Rehab Services; [facility] [phone] (ascom) Madhuri Vacca 03/02/2024, 10:01 AM

## 2024-03-02 NOTE — Evaluation (Addendum)
 Physical Therapy Evaluation and Discharge  Patient Details Name: Julia Cooke MRN: [id number] DOB: May 26, 1951 Today's Date: 03/02/2024  History of Present Illness  Patient is a 73 year old female with spontaneously resolving strokelike symptoms including upper extremity weakness, dysarthria, word finding difficulties, confusion. Flank pain with obstructive nephrolithiasis  Clinical Impression  Patient is agreeable to PT evaluation. She is independent at baseline and lives with her spouse.  Today the patient is independent with transfers. She walked in the hallway with no loss of balance without assistive device. Stable with turns, sudden stops, direction changes. The patient is able to donn/doff socks with no difficulty and no loss of balance while sitting. She also has no difficulty feeding herself. No dizziness reported with mobility. No apparent acute PT needs at this time. Patient does appear to be back to her baseline level of independence. PT will sign off.       If plan is discharge home, recommend the following: Assist for transportation   Can travel by private vehicle        Equipment Recommendations None recommended by PT  Recommendations for Other Services       Functional Status Assessment Patient has not had a recent decline in their functional status     Precautions / Restrictions Precautions Precautions: None Restrictions Weight Bearing Restrictions Per Provider Order: No      Mobility  Bed Mobility               General bed mobility comments: not assessed as patient sitting up on arrival and post session    Transfers Overall transfer level: Needs assistance Equipment used: None Transfers: Sit to/from Stand Sit to Stand: Independent                Ambulation/Gait Ambulation/Gait assistance: Modified independent (Device/Increase time) (incresed time) Gait Distance (Feet): 125 Feet Assistive device: None Gait Pattern/deviations: WFL(Within  Functional Limits) Gait velocity: decreased     General Gait Details: no loss of balance, no unsteadiness with walking  Stairs            Wheelchair Mobility     Tilt Bed    Modified Rankin (Stroke Patients Only)       Balance Overall balance assessment: No apparent balance deficits (not formally assessed), Needs assistance                           High level balance activites: Sudden stops, Turns, Direction changes High Level Balance Comments: no loss of balance             Pertinent Vitals/Pain Pain Assessment Pain Assessment: No/denies pain    Home Living Family/patient expects to be discharged to:: Private residence Living Arrangements: Spouse/significant other (grandson)   Type of Home: House Home Access: Stairs to enter   Secretary/administrator of Steps: 4-5            Prior Function Prior Level of Function : Independent/Modified Independent;Driving             Mobility Comments: independent, 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       Extremity/Trunk Assessment   Upper Extremity Assessment Upper Extremity Assessment: Overall WFL for tasks assessed (5/5 elbow flexion and extension)    Lower Extremity Assessment Lower Extremity Assessment: Overall WFL for tasks assessed (5/5 knee extension, dorsiflexion, plantarflexion)       Communication   Communication Communication: No apparent difficulties  Cognition Arousal: Alert Behavior During Therapy: WFL for tasks assessed/performed   PT - Cognitive impairments: No apparent impairments                         Following commands: Intact       Cueing       General Comments General comments (skin integrity, edema, etc.): patient is able to feed herself without difficulty. she is able to open the sock packaging and donn/doff socks without difficulty    Exercises     Assessment/Plan    PT Assessment Patient does not need any  further PT services  PT Problem List         PT Treatment Interventions      PT Goals (Current goals can be found in the Care Plan section)  Acute Rehab PT Goals PT Goal Formulation: All assessment and education complete, DC therapy    Frequency       Co-evaluation               AM-PAC PT "6 Clicks" Mobility  Outcome Measure Help needed turning from your back to your side while in a flat bed without using bedrails?: None Help needed moving from lying on your back to sitting on the side of a flat bed without using bedrails?: None Help needed moving to and from a bed to a chair (including a wheelchair)?: None Help needed standing up from a chair using your arms (e.g., wheelchair or bedside chair)?: None Help needed to walk in hospital room?: None Help needed climbing 3-5 steps with a railing? : None 6 Click Score: 24    End of Session   Activity Tolerance: Patient tolerated treatment well Patient left: in bed;with call bell/phone within reach;with family/visitor present Nurse Communication: Mobility status PT Visit Diagnosis: Unsteadiness on feet (R26.81)    Time: 9147-8295 PT Time Calculation (min) (ACUTE ONLY): 15 min   Charges:   PT Evaluation $PT Eval Low Complexity: 1 Low   PT General Charges $$ ACUTE PT VISIT: 1 Visit         Donna Bernard, PT, MPT  [facility] 03/02/2024, 9:33 AM

## 2024-03-03 LAB — HEMOGLOBIN A1C
Hgb A1c MFr Bld: 5.2 % (ref 4.8–5.6)
Mean Plasma Glucose: 103 mg/dL

## 2024-03-14 ENCOUNTER — Other Ambulatory Visit: Payer: Self-pay

## 2024-03-14 ENCOUNTER — Ambulatory Visit (INDEPENDENT_AMBULATORY_CARE_PROVIDER_SITE_OTHER)

## 2024-03-14 VITALS — BP 132/80 | HR 70 | Temp 98.0°F | Ht 60.0 in | Wt 181.0 lb

## 2024-03-14 DIAGNOSIS — L309 Dermatitis, unspecified: Secondary | ICD-10-CM | POA: Diagnosis not present

## 2024-03-14 MED ORDER — HYDROCORTISONE 0.5 % EX CREA
1.0000 | TOPICAL_CREAM | Freq: Two times a day (BID) | CUTANEOUS | 0 refills | Status: DC
  Filled 2024-03-14: qty 30, 15d supply, fill #0
  Filled 2024-03-14 (×2): qty 28.4, 14d supply, fill #0

## 2024-03-14 NOTE — Patient Instructions (Signed)
 Apply hydrocortisone cream, twice a day for 7 days then as needed if you still have symptoms. Steroid cream can cause thinning of the skin and cause further damage so we recommend you use this only if needed.   You can also use preservative free lotion like Cetaphil, CeraVe to help moisturize skin.   Take antihistamine tablet like Allegra, Claritin, zyrtec, Xyzol once a day to help with itching.   If your symptoms does not improve or gets worse I recommend seeing Dr. Madelon Scheuermann in 1-2 weeks.

## 2024-03-14 NOTE — Progress Notes (Signed)
   Acute Office Visit  Subjective:    Patient ID: Julia Cooke, female    DOB: 11/18/1951, 73 y.o.   MRN: [id number]  Chief Complaint  Patient presents with   Rash   Patient is in today for following acute concern:   1) Rash: for 2 weeks.  Itchy rash started on forehead that dispersed to chest, b/l flexural area of arms that was noticeable after working in her garden. Initial rash on forehead which is now resolved.  She describes the rash to be itchy. Patient also reports she found a Tic under her right armpit about 2 weeks ago. Her husband pulled the tic using a tweezers.  It looks black in color, was small in size without bump, rash, or redness on the spot where the tic was found.  She reports her friend who was working with her in her garden was exposed to poison ivy but believes patient herself did not come in contact with poison ivy.   She also denies new soap, detergent, skin care product.  She has been using Benadryl which has helped with pruritus.  Patient also reports since onset of the rash it is slowly improving.  Denies difficulty breathing, wheezing.   Pertinent positive: Patient was seen at ER for suspicion for TIA on 03/01/24. She was found to have 4 mm right ureter stone with flank pain and was treated with empiric 3 days course of Ciprofloxacin.    As per HPI    Objective:    BP 132/80   Pulse 70   Temp 98 F (36.7 C) (Oral)   Ht 5' (1.524 m)   Wt 181 lb (82.1 kg)   SpO2 97%   BMI 35.35 kg/m    Physical Exam Cardiovascular:     Rate and Rhythm: Normal rate.  Pulmonary:     Effort: Pulmonary effort is normal.     Breath sounds: Normal breath sounds.  Musculoskeletal:     Cervical back: No rigidity.  Skin:    Coloration: Skin is not jaundiced.     Comments: Multiple maculopapular lesions dispersed on patient's flexural areas of b/l arm, chest. Dry skin noted. No lesions on the back. Defer to image taken from today's visit.   Neurological:     Mental Status:  She is oriented to person, place, and time.  Psychiatric:        Mood and Affect: Mood normal.        No results found for any visits on 03/14/24.     Assessment & Plan:  Dermatitis -     Hydrocortisone; Apply 1 Application topically 2 (two) times daily.  Dispense: 30 g; Refill: 0    No follow-ups on file.  Jacklin Mascot, MD

## 2024-03-14 NOTE — Assessment & Plan Note (Signed)
 D/D includes atopic dermitis, contact dermatitis.  - Apply hydrocortisone cream, twice a day for 7 days then as needed if you still have symptoms. Steroid cream can cause thinning of the skin and cause further damage so we recommend you use this only if needed.   - Moisturize: Use preservative free lotion like Cetaphil, CeraVe to help moisturize skin. Avoid irritants, scrubbing, taking hot shower.   - Take antihistamine tablet like Allegra, Claritin, zyrtec, Xyzol once a day to help with itching.

## 2024-03-15 ENCOUNTER — Other Ambulatory Visit: Payer: Self-pay

## 2024-03-16 ENCOUNTER — Other Ambulatory Visit: Payer: Self-pay

## 2024-03-19 ENCOUNTER — Other Ambulatory Visit: Payer: Self-pay

## 2024-03-21 ENCOUNTER — Inpatient Hospital Stay: Admitting: Family

## 2024-03-22 ENCOUNTER — Ambulatory Visit

## 2024-03-22 ENCOUNTER — Ambulatory Visit: Payer: Self-pay

## 2024-03-22 ENCOUNTER — Other Ambulatory Visit: Payer: Self-pay

## 2024-03-22 VITALS — BP 136/82 | HR 77 | Temp 98.6°F | Ht 60.0 in | Wt 178.8 lb

## 2024-03-22 DIAGNOSIS — T148XXA Other injury of unspecified body region, initial encounter: Secondary | ICD-10-CM | POA: Diagnosis not present

## 2024-03-22 DIAGNOSIS — L237 Allergic contact dermatitis due to plants, except food: Secondary | ICD-10-CM | POA: Diagnosis not present

## 2024-03-22 DIAGNOSIS — Z23 Encounter for immunization: Secondary | ICD-10-CM

## 2024-03-22 DIAGNOSIS — L304 Erythema intertrigo: Secondary | ICD-10-CM | POA: Insufficient documentation

## 2024-03-22 MED ORDER — PREDNISONE 20 MG PO TABS
ORAL_TABLET | ORAL | 0 refills | Status: DC
  Filled 2024-03-22: qty 35, 28d supply, fill #0

## 2024-03-22 MED ORDER — KETOCONAZOLE 2 % EX CREA
1.0000 | TOPICAL_CREAM | Freq: Two times a day (BID) | CUTANEOUS | 1 refills | Status: DC
  Filled 2024-03-22: qty 60, 30d supply, fill #0

## 2024-03-22 MED ORDER — KETOCONAZOLE 2 % EX CREA: TOPICAL_CREAM | Freq: Two times a day (BID) | CUTANEOUS | Status: DC

## 2024-03-22 MED ORDER — NYSTATIN 100000 UNIT/GM EX POWD
1.0000 | Freq: Two times a day (BID) | CUTANEOUS | 1 refills | Status: AC
  Filled 2024-03-22: qty 15, 8d supply, fill #0

## 2024-03-22 NOTE — Progress Notes (Signed)
 Acute Office Visit  Subjective:    Patient ID: Julia Cooke, female    DOB: [birth year], 73 y.o.   MRN: [id number]  Chief Complaint  Patient presents with   Rash   Patient is in today for following acute concern:   1) Rash: Patient was last seen by me on 03/14/24 for evaluation of rash for 2 weeks, she was treated with Hydrocortisone  cream, daily antihistamine and use of gentle skin care. Today she presents for evaluation of worsening rash as follows: - Spreading, blistering, itchy rashes on b/l hands, forearms, arms, upper neck. Denies burning.   - Red, itchy skin changes under b/l breast and within groin areas that is different from the other blistering rashes.  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 Has previous poison ivy exposure for which she was treated with IM steroid injections. Does not always wear gloves, protective gear when working outdoors.  - Single skin abrasion on left thumb when she was working in the garden. Is due for Tetanus immunization.  - No fever, joint pain, shortness of breath.    As per HPI    Objective:    BP 136/82   Pulse 77   Temp 98.6 F (37 C) (Oral)   Ht 5' (1.524 m)   Wt 178 lb 12.8 oz (81.1 kg)   SpO2 94%   BMI 34.92 kg/m    Physical Exam HENT:     Head: Normocephalic.  Cardiovascular:     Rate and Rhythm: Normal rate.  Pulmonary:     Effort: Pulmonary effort is normal.     Breath sounds: Normal breath sounds.  Abdominal:     Palpations: Abdomen is soft.  Musculoskeletal:     Cervical back: No rigidity.  Skin:    Coloration: Skin is not jaundiced.     Comments: Patient has multiple vesicular lesions dispersed throughout b/l hands, fingers, arms, forearms and upper chest.   Erythematous, without fissuring, moist skin under both breast and on b/l inguinal region.  Neurological:     Mental Status: She is oriented to person, place, and time.  Psychiatric:        Mood and Affect: Mood normal.               No results found for any visits on 03/22/24.     Assessment & Plan:  Poison ivy dermatitis Assessment & Plan: Widespread poison ivy dermatitis, recommend starting on Prednisone  oral therapy given the widespread skin involvement. S/E discussed with the patient. Recommend taking this with food, avoid taking NSAIDs when on Prednisone . Counseled on wearing protective wear to reduce future exposure to the plant. If patient develops difficulty breathing she should go to the ED. We will consider checking CBC, ESR if symptoms persists despite above treatment.  - predniSONE  (DELTASONE ) 20 MG tablet; Take 2 tablets (40 mg total) by mouth daily with breakfast for 7 days, THEN 1.5 tablets (30 mg total) daily with breakfast for 7 days, THEN 1 tablet (20 mg total) daily with breakfast for 7 days, THEN 0.5 tablets (10 mg total) daily with breakfast for 7 days.  Dispense: 35 tablet; Refill: 0   Orders: -     predniSONE ; Take 2 tablets (40 mg total) by mouth daily with breakfast for 7 days, THEN 1.5 tablets (30 mg total) daily with breakfast for 7 days, THEN 1 tablet (20 mg total) daily with breakfast for 7 days, THEN 0.5 tablets (10 mg total) daily with breakfast for 7 days.  Dispense: 35 tablet; Refill: 0  Intertrigo Assessment & Plan: - Skin care to keep the involved area dry discussed. She can try Nystatin  powder twice a day with Ketoconazole  2% cream twice a day for next 3-4 weeks given initiation of oral steroid for poison ivy and increased r/o worsening fungal infection.  - nystatin  (MYCOSTATIN /NYSTOP ) powder; Apply 1 Application topically 2 (two) times daily for 3 weeks. To rash until resolved.  Dispense: 15 g; Refill: 1 - ketoconazole  (NIZORAL ) 2 % cream, twice a day for 3 weeks.   Orders: -     Nystatin ; Apply 1 Application topically 2 (two) times daily. To rash until resolved.  Dispense: 15 g; Refill: 1 -     Ketoconazole   Skin abrasion Assessment & Plan: Recommend  updating tetanus booster today. Patient agreeable.     Need for Tdap vaccination -     Tdap vaccine greater than or equal to 7yo IM    Return in about 3 weeks (around 04/12/2024) for Follow up.  Jacklin Mascot, MD

## 2024-03-22 NOTE — Telephone Encounter (Addendum)
   Chief Complaint: rash Symptoms: itching  Disposition: [] ED /[] Urgent Care (no appt availability in office) / [x] Appointment(In office/virtual)/ []  [facility]/ [] Home Care/ [] Refused Recommended Disposition /[] [place] [facility]/ []  Follow-up with PCP Additional Notes: Pt complaining of worsening rash.  Pt was seen on 4/16 with rash on arms. Rash has now spread to face, arms, neck, stomach, and starting on legs. Pt states "its red with tiny looking blisters." Pt rated itching 10/10. Pt has appt at 0900 this morning. No care advice given due to appt time. Pt was walking into office.         Copied from CRM [id number]. Topic: Clinical - Red Word Triage >> Mar 22, 2024  8:50 AM Deaijah H wrote: Red Word that prompted transfer to Nurse Triage: Rash has gotten worse. spreading on face/neck Reason for Disposition  Large or small blisters on skin (i.e., fluid filled bubbles or sacs)  Answer Assessment - Initial Assessment Questions 1. APPEARANCE of RASH: "Describe the rash." (e.g., spots, blisters, raised areas, skin peeling, scaly)     Red, raised. blisters 2. SIZE: "How big are the spots?" (e.g., tip of pen, eraser, coin; inches, centimeters)     tiny 3. LOCATION: "Where is the rash located?"     Face, neck, arms, belly, starting on legs 4. COLOR: "What color is the rash?" (Note: It is difficult to assess rash color in people with darker-colored skin. When this situation occurs, simply ask the caller to describe what they see.)     red 5. ONSET: "When did the rash begin?"     Several weeks  6. FEVER: "Do you have a fever?" If Yes, ask: "What is your temperature, how was it measured, and when did it start?"     denies 7. ITCHING: "Does the rash itch?" If Yes, ask: "How bad is the itch?" (Scale 1-10; or mild, moderate, severe)     10 8. CAUSE: "What do you think is causing the rash?"     Poison ivy  9. MEDICINE FACTORS: "Have you started any new medicines within the last  2 weeks?" (e.g., antibiotics)      Itching crea, 10. OTHER SYMPTOMS: "Do you have any other symptoms?" (e.g., dizziness, headache, sore throat, joint pain)       denies  Protocols used: Rash or Redness - [facility]

## 2024-03-22 NOTE — Addendum Note (Signed)
 Addended by: Elfrida Pixley on: 03/22/2024 02:40 PM   Modules accepted: Orders

## 2024-03-22 NOTE — Addendum Note (Signed)
 Addended by: Kamaal Cast on: 03/22/2024 02:12 PM   Modules accepted: Orders

## 2024-03-22 NOTE — Assessment & Plan Note (Signed)
 Recommend updating tetanus booster today. Patient agreeable.

## 2024-03-22 NOTE — Assessment & Plan Note (Addendum)
 Widespread poison ivy dermatitis, recommend starting on Prednisone  oral therapy given the widespread skin involvement. S/E discussed with the patient. Recommend taking this with food, avoid taking NSAIDs when on Prednisone . Counseled on wearing protective wear to reduce future exposure to the plant. If patient develops difficulty breathing she should go to the ED. We will consider checking CBC, ESR if symptoms persists despite above treatment.  - predniSONE  (DELTASONE ) 20 MG tablet; Take 2 tablets (40 mg total) by mouth daily with breakfast for 7 days, THEN 1.5 tablets (30 mg total) daily with breakfast for 7 days, THEN 1 tablet (20 mg total) daily with breakfast for 7 days, THEN 0.5 tablets (10 mg total) daily with breakfast for 7 days.  Dispense: 35 tablet; Refill: 0

## 2024-03-22 NOTE — Telephone Encounter (Signed)
 Noted.

## 2024-03-22 NOTE — Assessment & Plan Note (Signed)
-   Skin care to keep the involved area dry discussed. She can try Nystatin  powder twice a day with Ketoconazole  2% cream twice a day for next 3-4 weeks given initiation of oral steroid for poison ivy and increased r/o worsening fungal infection.  - nystatin  (MYCOSTATIN /NYSTOP ) powder; Apply 1 Application topically 2 (two) times daily for 3 weeks. To rash until resolved.  Dispense: 15 g; Refill: 1 - ketoconazole  (NIZORAL ) 2 % cream, twice a day for 3 weeks.

## 2024-03-22 NOTE — Patient Instructions (Signed)
 For the poison ivy rash:  Start Prednisone  40 mg, once a day for 1 week then 30 mg once a day for 1 week then  20 mg once a day for one week then  10 mg once a day for one week. Stop.   Please wear protective gear when working outdoors.   1) Apply nystatin  powder, twice a day under breast and groin area.   2) Apply ketoconazole  2 % cream under the breast and groin area twice a day. Try to keep these area dry.

## 2024-03-23 ENCOUNTER — Inpatient Hospital Stay: Admitting: Internal Medicine

## 2024-03-30 ENCOUNTER — Other Ambulatory Visit: Payer: Self-pay

## 2024-04-02 ENCOUNTER — Ambulatory Visit (INDEPENDENT_AMBULATORY_CARE_PROVIDER_SITE_OTHER): Payer: Medicare HMO | Admitting: Internal Medicine

## 2024-04-02 ENCOUNTER — Other Ambulatory Visit: Payer: Self-pay | Admitting: Cardiovascular Disease

## 2024-04-02 ENCOUNTER — Other Ambulatory Visit: Payer: Self-pay

## 2024-04-02 ENCOUNTER — Other Ambulatory Visit: Payer: Self-pay | Admitting: Psychiatry

## 2024-04-02 ENCOUNTER — Ambulatory Visit

## 2024-04-02 ENCOUNTER — Encounter: Payer: Self-pay | Admitting: Internal Medicine

## 2024-04-02 ENCOUNTER — Other Ambulatory Visit: Payer: Self-pay | Admitting: Physician Assistant

## 2024-04-02 ENCOUNTER — Ambulatory Visit: Attending: Internal Medicine

## 2024-04-02 VITALS — BP 140/78 | HR 85 | Ht 60.0 in | Wt 179.8 lb

## 2024-04-02 DIAGNOSIS — G4701 Insomnia due to medical condition: Secondary | ICD-10-CM

## 2024-04-02 DIAGNOSIS — Z8673 Personal history of transient ischemic attack (TIA), and cerebral infarction without residual deficits: Secondary | ICD-10-CM

## 2024-04-02 DIAGNOSIS — N200 Calculus of kidney: Secondary | ICD-10-CM | POA: Diagnosis not present

## 2024-04-02 DIAGNOSIS — N2889 Other specified disorders of kidney and ureter: Secondary | ICD-10-CM | POA: Diagnosis not present

## 2024-04-02 DIAGNOSIS — G459 Transient cerebral ischemic attack, unspecified: Secondary | ICD-10-CM

## 2024-04-02 DIAGNOSIS — N201 Calculus of ureter: Secondary | ICD-10-CM | POA: Diagnosis not present

## 2024-04-02 DIAGNOSIS — E785 Hyperlipidemia, unspecified: Secondary | ICD-10-CM | POA: Diagnosis not present

## 2024-04-02 DIAGNOSIS — F5101 Primary insomnia: Secondary | ICD-10-CM | POA: Diagnosis not present

## 2024-04-02 DIAGNOSIS — R42 Dizziness and giddiness: Secondary | ICD-10-CM | POA: Diagnosis not present

## 2024-04-02 DIAGNOSIS — N132 Hydronephrosis with renal and ureteral calculous obstruction: Secondary | ICD-10-CM

## 2024-04-02 DIAGNOSIS — H8110 Benign paroxysmal vertigo, unspecified ear: Secondary | ICD-10-CM

## 2024-04-02 LAB — BASIC METABOLIC PANEL WITH GFR
BUN: 24 mg/dL — ABNORMAL HIGH (ref 6–23)
CO2: 30 meq/L (ref 19–32)
Calcium: 9.6 mg/dL (ref 8.4–10.5)
Chloride: 99 meq/L (ref 96–112)
Creatinine, Ser: 0.82 mg/dL (ref 0.40–1.20)
GFR: 71.52 mL/min (ref >60.00)
Glucose, Bld: 140 mg/dL — ABNORMAL HIGH (ref 70–99)
Potassium: 4.6 meq/L (ref 3.5–5.1)
Sodium: 137 meq/L (ref 135–145)

## 2024-04-02 MED ORDER — ATORVASTATIN CALCIUM 10 MG PO TABS
10.0000 mg | ORAL_TABLET | Freq: Every day | ORAL | 0 refills | Status: DC
  Filled 2024-04-02: qty 30, 30d supply, fill #0

## 2024-04-02 MED ORDER — METOPROLOL SUCCINATE ER 50 MG PO TB24
50.0000 mg | ORAL_TABLET | Freq: Every day | ORAL | 0 refills | Status: DC
  Filled 2024-04-02: qty 30, 30d supply, fill #0

## 2024-04-02 MED FILL — Metoprolol Succinate Tab ER 24HR 50 MG (Tartrate Equiv): ORAL | 30 days supply | Qty: 30 | Fill #0 | Status: AC

## 2024-04-02 NOTE — Progress Notes (Addendum)
 Subjective:  Patient ID: Julia Cooke, female    DOB: [birth year]  Age: 73 y.o. MRN: [id number]  CC: The primary encounter diagnosis was BPV (benign positional vertigo), unspecified laterality. Diagnoses of Ureteral calculi, TIA (transient ischemic attack), Vertigo, Bilateral nephrolithiasis, Hydronephrosis concurrent with and due to calculi of kidney and ureter, History of TIA (transient ischemic attack), Hyperlipidemia LDL goal <100, and Primary insomnia were also pertinent to this visit.   HPI Julia Cooke presents for  Chief Complaint  Patient presents with  . Medical Management of Chronic Issues    3 month follow up     1 [name] was treated in [facility] ER on April 3 for multiple issues :   a) TIA : she presented with  SYMPTOMS OF L SIDED apraxia/weakness of upper extremity and confusion that occurred while eating breakfast. And was witnessed by husband.   Symptoms had resolved by time of ER evaluation.    CT head ruled out [facility] and   MRI brainnoted chronic small vessel ischemic changes.  She  was not admitted for unclear reasons.  She was referred to Dr [name] , [facility],  but has not been able to t make contact with his front office for unclear reasons.  However,  she has an appointment with Dr [name]  on  June 10 .  She  Has been taking atorvastatin  and LDL is < 70.  She is now taking a baby aspirin  daily   B) nephrolithiasis:  DURING  ER VISIT CT PELVIS was done due to hematuria noted on urinalysis.  CT NOTED found a 4 mm obstructing UVJ stone on the right with moderate to severe right sided hydronephrosis/  on call urologist [facility], was consulted,  no intervention advised.  She was given an rx for cipro  x 3 days,  NO STRAiNER GIVEN.   SENT HOME WITH  NO UROLOGY REFERRAL OR  FOLLOW UP.    She is understandably FRUSTRATED today because of her multiple unrelated issues that have not been adequately addressed .  She is also having  multiple recurrent episodes of vertigo  which occur  with position change of head.   She is not sleeping well , up 3-4 times per night with overactive  bladder . Usually takes  ambien  5  mg but states that it is no longer working.  Bed hygiene reviewed:  she readies herself for bed,  but sleeps in the recliner chair for several hours,  then gets up to go to bed and can't go back to sleep.  Husband watches TV in bed so she does not go to bed when sleepy.   ) right arm pain  alternating with aching, she  made appt with DR [name] but missed her appt and is now scheduled to see him months from now.   7) right leg pain t:  she has been taking gabapentin  for unclear reason  current dose is  100 mg tid  .  She denies sciatica and low back pain.   Outpatient Medications Prior to Visit  Medication Sig Dispense Refill  . amLODipine  (NORVASC ) 2.5 MG tablet TAKE 1 TABLET BY MOUTH AT BEDTIME 90 tablet 1  . aspirin  EC 81 MG tablet Take 1 tablet (81 mg total) by mouth daily. Swallow whole.    . Cyanocobalamin  (VITAMIN B-12 PO) Take by mouth daily.    . dicyclomine  (BENTYL ) 10 MG capsule Take 1 capsule (10 mg total) by mouth 4 (four) times daily -  before meals and at bedtime. 120 capsule 3  . gabapentin  (NEURONTIN ) 100 MG capsule Take 1 capsule (100 mg total) by mouth 3 (three) times daily. 90 capsule 3  . hydrocortisone  cream 0.5 % Apply 1 Application topically 2 (two) times daily. 28.4 g 0  . irbesartan  (AVAPRO ) 300 MG tablet Take 1 tablet (300 mg total) by mouth daily. 90 tablet 0  . ketoconazole  (NIZORAL ) 2 % cream Apply 1 Application topically 2 (two) times daily. 60 g 1  . lamoTRIgine  (LAMICTAL ) 25 MG tablet Take 1 tablet (25 mg total) by mouth 2 (two) times daily. 180 tablet 1  . melatonin 5 MG TABS Take 5 mg by mouth.    . Multiple Vitamin (MULTIVITAMIN) tablet Take 1 tablet by mouth daily.    . nystatin  (MYCOSTATIN /NYSTOP ) powder Apply 1 Application topically 2 (two) times daily. To rash until resolved. 15 g 1  . predniSONE  (DELTASONE ) 20 MG  tablet Take 2 tablets (40 mg total) by mouth daily with breakfast for 7 days, THEN 1.5 tablets (30 mg total) daily with breakfast for 7 days, THEN 1 tablet (20 mg total) daily with breakfast for 7 days, THEN 0.5 tablets (10 mg total) daily with breakfast for 7 days. 35 tablet 0  . Spacer/Aero-Holding Chambers DEVI 1 Units by Does not apply route daily. 1 Units 0  . zolpidem  (AMBIEN ) 5 MG tablet Take 0.5-1 tablets (2.5-5 mg total) by mouth at bedtime as needed for sleep. 30 tablet 3  . atorvastatin  (LIPITOR) 10 MG tablet Take 1 tablet (10 mg total) by mouth daily. 90 tablet 0  . metoprolol  succinate (TOPROL -XL) 50 MG 24 hr tablet Take 1 tablet (50 mg total) by mouth daily with or immediately following a meal. 90 tablet 3  . DULoxetine  (CYMBALTA ) 30 MG capsule Take 1 capsule (30 mg total) by mouth daily. No future refills without keeping appointment. (Patient not taking: Reported on 03/22/2024) 30 capsule 2  . loratadine  (CLARITIN ) 10 MG tablet Take 1 tablet (10 mg total) by mouth daily. (Patient not taking: Reported on 03/14/2024) 30 tablet 11   No facility-administered medications prior to visit.    Review of Systems;  Patient denies headache, fevers, malaise, unintentional weight loss, skin rash, eye pain, sinus congestion and sinus pain, sore throat, dysphagia,  hemoptysis , cough, dyspnea, wheezing, chest pain, palpitations, orthopnea, edema, abdominal pain, nausea, melena, diarrhea, constipation, flank pain, dysuria, hematuria, urinary  Frequency, nocturia, numbness, tingling, seizures,  Focal weakness, Loss of consciousness,  Tremor, insomnia, depression, anxiety, and suicidal ideation.      Objective:  BP (!) 140/78   Pulse 85   Ht 5' (1.524 m)   Wt 179 lb 12.8 oz (81.6 kg)   SpO2 97%   BMI 35.11 kg/m   BP Readings from Last 3 Encounters:  04/02/24 (!) 140/78  03/22/24 136/82  03/14/24 132/80    Wt Readings from Last 3 Encounters:  04/02/24 179 lb 12.8 oz (81.6 kg)  03/22/24 178  lb 12.8 oz (81.1 kg)  03/14/24 181 lb (82.1 kg)    Physical Exam Vitals reviewed.  Constitutional:      General: She is not in acute distress.    Appearance: Normal appearance. She is normal weight. She is not ill-appearing, toxic-appearing or diaphoretic.  HENT:     Head: Normocephalic.  Eyes:     General: No scleral icterus.       Right eye: No discharge.        Left eye: No discharge.  Conjunctiva/sclera: Conjunctivae normal.  Cardiovascular:     Rate and Rhythm: Normal rate and regular rhythm.     Heart sounds: Normal heart sounds.  Pulmonary:     Effort: Pulmonary effort is normal. No respiratory distress.     Breath sounds: Normal breath sounds.  Musculoskeletal:        General: Normal range of motion.  Skin:    General: Skin is warm and dry.  Neurological:     General: No focal deficit present.     Mental Status: She is alert and oriented to person, place, and time. Mental status is at baseline.  Psychiatric:        Mood and Affect: Mood normal.        Behavior: Behavior normal.        Thought Content: Thought content normal.        Judgment: Judgment normal.   Lab Results  Component Value Date   HGBA1C 5.2 03/02/2024   HGBA1C 5.0 01/02/2024   HGBA1C 5.2 03/01/2023    Lab Results  Component Value Date   CREATININE 0.82 04/02/2024   CREATININE 0.79 03/01/2024   CREATININE 0.86 01/02/2024    Lab Results  Component Value Date   WBC 8.1 03/01/2024   HGB 14.3 03/01/2024   HCT 41.5 03/01/2024   PLT 240 03/01/2024   GLUCOSE 140 (H) 04/02/2024   CHOL 126 03/02/2024   TRIG 110 03/02/2024   HDL 36 (L) 03/02/2024   LDLDIRECT 98.0 01/02/2024   LDLCALC 68 03/02/2024   ALT 18 03/01/2024   AST 20 03/01/2024   NA 137 04/02/2024   K 4.6 04/02/2024   CL 99 04/02/2024   CREATININE 0.82 04/02/2024   BUN 24 (H) 04/02/2024   CO2 30 04/02/2024   TSH 0.68 01/02/2024   HGBA1C 5.2 03/02/2024   MICROALBUR 0.8 01/02/2024    MR BRAIN WO CONTRAST Result Date:  03/01/2024 CLINICAL DATA:  Left-sided weakness.  Transient ischemic attack. EXAM: MRI HEAD WITHOUT CONTRAST TECHNIQUE: Multiplanar, multiecho pulse sequences of the brain and surrounding structures were obtained without intravenous contrast. COMPARISON:  None Available. FINDINGS: Brain: No acute infarct, mass effect or extra-axial collection. No acute or chronic hemorrhage. Minimal multifocal hyperintense T2-weight signal within the white matter. The midline structures are normal. Vascular: Normal flow voids. Skull and upper cervical spine: Normal calvarium and skull base. Visualized upper cervical spine and soft tissues are normal. Sinuses/Orbits:No paranasal sinus fluid levels or advanced mucosal thickening. No mastoid or middle ear effusion. Normal orbits. IMPRESSION: 1. No acute intracranial abnormality. 2. Minimal multifocal hyperintense T2-weight signal within the white matter, nonspecific but may be seen in the setting of migraine headaches or early chronic small vessel ischemia. Electronically Signed   By: Juanetta Nordmann M.D.   On: 03/01/2024 19:47   CT HEAD WO CONTRAST ( ) Result Date: 03/01/2024 CLINICAL DATA:  TIA.  Confusion and lethargy. EXAM: CT HEAD WITHOUT CONTRAST TECHNIQUE: Contiguous axial images were obtained from the base of the skull through the vertex without intravenous contrast. RADIATION DOSE REDUCTION: This exam was performed according to the departmental dose-optimization program which includes automated exposure control, adjustment of the mA and/or kV according to patient size and/or use of iterative reconstruction technique. COMPARISON:  Head CT 02/02/2013 FINDINGS: Brain: No evidence of acute infarction, hemorrhage, hydrocephalus, extra-axial collection or mass lesion/mass effect. Vascular: No hyperdense vessel or unexpected calcification. Skull: Normal. Negative for fracture or focal lesion. Sinuses/Orbits: No acute finding. Other: None. IMPRESSION: No acute intracranial  abnormality. Electronically  Signed   By: Tyron Gallon M.D.   On: 03/01/2024 15:14   CT Renal Stone Study Result Date: 03/01/2024 CLINICAL DATA:  Right-sided flank pain. EXAM: CT ABDOMEN AND PELVIS WITHOUT CONTRAST TECHNIQUE: Multidetector CT imaging of the abdomen and pelvis was performed following the standard protocol without IV contrast. RADIATION DOSE REDUCTION: This exam was performed according to the departmental dose-optimization program which includes automated exposure control, adjustment of the mA and/or kV according to patient size and/or use of iterative reconstruction technique. COMPARISON:  CT abdomen/pelvis dated 10/02/2019. FINDINGS: Lower chest: No acute abnormality. Hepatobiliary: No focal liver abnormality is seen. No gallstones, gallbladder wall thickening, or biliary dilatation. Pancreas: Unremarkable. No pancreatic ductal dilatation or surrounding inflammatory changes. Spleen: Normal in size without focal abnormality. Adrenals/Urinary Tract: Adrenal glands are unremarkable. There is a 4 mm calculus at the right ureterovesicular junction (3:79), with moderate-to-severe right hydroureteronephrosis. Additional nonobstructing punctate calculi in the right kidney. Several nonobstructing left-sided calculi measuring up to 4 mm. No left-sided ureteral calculi. No left-sided hydronephrosis. Stomach/Bowel: Stomach is within normal limits. Appendix appears normal. No evidence of bowel wall thickening, distention, or inflammatory changes. A few sigmoid colonic diverticula without evidence of acute diverticulitis. Vascular/Lymphatic: Aortic atherosclerosis. No enlarged abdominal or pelvic lymph nodes. Reproductive: Uterus and bilateral adnexa are unremarkable. Other: No abdominal wall hernia or abnormality. No abdominopelvic ascites. No intraperitoneal free air. Musculoskeletal: No acute osseous abnormality. No suspicious osseous lesion. Multilevel degenerative changes of the thoracolumbar spine.  IMPRESSION: 1. 4 mm calculus at the right UVJ with moderate-to-severe right hydroureteronephrosis. 2. Additional bilateral nonobstructing renal calculi. 3. A few sigmoid colonic diverticula without evidence of acute diverticulitis. Electronically Signed   By: Mannie Seek M.D.   On: 03/01/2024 15:09    Assessment & Plan:  .BPV (benign positional vertigo), unspecified laterality -     Ambulatory referral to ENT  Ureteral calculi -     DG Abd 1 View; Future -     Basic metabolic panel with GFR -     Ambulatory referral to Urology  TIA (transient ischemic attack) -     ECHOCARDIOGRAM COMPLETE; Future -     LONG TERM MONITOR (3-14 DAYS); Future  Vertigo -     LONG TERM MONITOR (3-14 DAYS); Future  Bilateral nephrolithiasis -     Ambulatory referral to Urology  Hydronephrosis concurrent with and due to calculi of kidney and ureter Assessment & Plan: Right sided,  noted on CT abd /pelvis in March during ER visit for AMS.  Obstructing UVJ stone noted,  no intervention per urology, no strainer given to patient and no urology follow up given.  Urology referral made today.  Rechecking GFR today which is normal  plain abd film done today does not show a persistent stone (by my read)  Lab Results  Component Value Date   NA 137 04/02/2024   K 4.6 04/02/2024   CL 99 04/02/2024   CO2 30 04/02/2024   Lab Results  Component Value Date   CREATININE 0.82 04/02/2024     Orders: -     Ambulatory referral to Urology  History of TIA (transient ischemic attack) Assessment & Plan: Workup by ER  physician in March was incomplete.  I have spoken with her cardiologist and an ECHO will be ordered with agitated saline.  I have ordered ZIO monitor.  Continue asa which was prescribed by EDP . Has appt in June with neurology ;  will see if anyone in [place] can see her  sooner.    Hyperlipidemia LDL goal <100 Assessment & Plan: Continue atorvastatin ,  LDL < 70  Lab Results  Component Value Date    CHOL 126 03/02/2024   HDL 36 (L) 03/02/2024   LDLCALC 68 03/02/2024   LDLDIRECT 98.0 01/02/2024   TRIG 110 03/02/2024   CHOLHDL 3.5 03/02/2024      Primary insomnia Assessment & Plan: No changes to medication.  Recommend altering nighttime regimen to facilitate sleep      I spent 50 minutes on the day of this face to face encounter reviewing patient's  most recent ER visit in March, including  all relevant  labs and imaging studies, communicating with cardiology, and neurology,   reviewing the assessment and plan with patient, and post visit ordering and reviewing of  diagnostics and therapeutics with patient  .   Follow-up: Return in about 4 weeks (around 04/30/2024) for follow up on stones,  vertigo etc.   Thersia Flax, MD

## 2024-04-02 NOTE — Patient Instructions (Addendum)
 CONTINUE TAKING 81 MG ASPIRIN  DAILY AND YOUR LIPITOR (ATORVASTATIN ).  THIS IS YOUR TREATMENT FOR TIA .     I WIL LASK DR ARIDA TO REPEAT YOUR ECHO  I am ordering a ZIO monitor for you to wear for 2 weeks to monitor your heart rhythms .  It will come to your house and you can apply it to your chest wall.  If you have any trouble applying it you can schedule a RN visit in our office and we will help you     Ask Dr.  Tere Felts about changing sleep med to Ambien  CR   Gabapentin   may be causing dizziness.  Wean off as follows:    1 tablet two times daily for 3 days,  then  1 tablet once daily for 3 days, then STOP     INCREASE YOUR AMLODIPINE  TO 5 MG DAILY IN THE EVENINGFOR YOUR BLOOD PRESSURE PLEASE STARTING  CHECKING YOUR BP ONCE A AY FOR 5 DAYS AND SEND TO ME.    THE STONE ON THE RIGHT WAS CAUSING YOUR RIGHT KIDNEY TO FILL UP WITH URINE AND BECOME ENLARGED . I AM REPEATING AN X RAY TO SEE IF YOU PASSED IT,  BUT YOU MAY REQUIRE A CT OF THE KIDNEYS

## 2024-04-03 ENCOUNTER — Encounter: Payer: Self-pay | Admitting: Internal Medicine

## 2024-04-03 DIAGNOSIS — N132 Hydronephrosis with renal and ureteral calculous obstruction: Secondary | ICD-10-CM | POA: Insufficient documentation

## 2024-04-03 DIAGNOSIS — Z8673 Personal history of transient ischemic attack (TIA), and cerebral infarction without residual deficits: Secondary | ICD-10-CM | POA: Insufficient documentation

## 2024-04-03 NOTE — Assessment & Plan Note (Signed)
 No changes to medication.  Recommend altering nighttime regimen to facilitate sleep

## 2024-04-03 NOTE — Assessment & Plan Note (Addendum)
 Workup by ER  physician in March was incomplete.  I have spoken with her cardiologist and an ECHO will be ordered with agitated saline.  I have ordered ZIO monitor.  Continue asa which was prescribed by EDP . Has appt in June with neurology ;  will see if anyone in [place] can see her sooner.

## 2024-04-03 NOTE — Assessment & Plan Note (Addendum)
 Right sided,  noted on CT abd /pelvis in March during ER visit for AMS.  Obstructing UVJ stone noted,  no intervention per urology, no strainer given to patient and no urology follow up given.  Urology referral made today.  Rechecking GFR today which is normal  plain abd film done today does not show a persistent stone (by my read)  Lab Results  Component Value Date   NA 137 04/02/2024   K 4.6 04/02/2024   CL 99 04/02/2024   CO2 30 04/02/2024   Lab Results  Component Value Date   CREATININE 0.82 04/02/2024

## 2024-04-03 NOTE — Assessment & Plan Note (Signed)
 Continue atorvastatin ,  LDL < 70  Lab Results  Component Value Date   CHOL 126 03/02/2024   HDL 36 (L) 03/02/2024   LDLCALC 68 03/02/2024   LDLDIRECT 98.0 01/02/2024   TRIG 110 03/02/2024   CHOLHDL 3.5 03/02/2024

## 2024-04-04 ENCOUNTER — Encounter: Payer: Self-pay | Admitting: Psychiatry

## 2024-04-04 ENCOUNTER — Other Ambulatory Visit: Payer: Self-pay

## 2024-04-04 ENCOUNTER — Ambulatory Visit (INDEPENDENT_AMBULATORY_CARE_PROVIDER_SITE_OTHER): Admitting: Psychiatry

## 2024-04-04 VITALS — BP 118/82 | HR 106 | Temp 97.8°F | Ht 60.0 in | Wt 178.0 lb

## 2024-04-04 DIAGNOSIS — F3161 Bipolar disorder, current episode mixed, mild: Secondary | ICD-10-CM | POA: Diagnosis not present

## 2024-04-04 DIAGNOSIS — G4701 Insomnia due to medical condition: Secondary | ICD-10-CM

## 2024-04-04 MED ORDER — DULOXETINE HCL 30 MG PO CPEP
30.0000 mg | ORAL_CAPSULE | Freq: Every day | ORAL | 1 refills | Status: DC
  Filled 2024-04-04: qty 30, 30d supply, fill #0
  Filled 2024-05-03: qty 30, 30d supply, fill #1

## 2024-04-04 MED ORDER — ZOLPIDEM TARTRATE 5 MG PO TABS
2.5000 mg | ORAL_TABLET | Freq: Every evening | ORAL | 3 refills | Status: DC | PRN
  Filled 2024-04-04: qty 30, 30d supply, fill #0
  Filled 2024-05-04: qty 30, 30d supply, fill #1
  Filled 2024-06-22: qty 30, 30d supply, fill #2

## 2024-04-04 MED ORDER — LAMOTRIGINE 25 MG PO TABS
ORAL_TABLET | ORAL | 0 refills | Status: DC
  Filled 2024-04-04: qty 45, 30d supply, fill #0

## 2024-04-04 NOTE — Progress Notes (Unsigned)
 [facility] MD OP Progress Note  04/04/2024 12:38 PM Julia Cooke  MRN:  [id number]  Chief Complaint:  Chief Complaint  Patient presents with   Follow-up   Anxiety   Depression   Medication Refill    HPI: Julia Cooke is a 73 year old Caucasian female, married, on SSI, lives in [place], has a history of bipolar disorder, anxiety disorder, insomnia, chronic pain likely due to neuropathy was evaluated for a follow-up appointment.  Patient's last visit was on 09/29/2023.    Patient today presented along with her spouse who provided collateral information.  Patient describes a recent visit to the emergency department where she was told she may have had possible TIA.  She struggled with her coordination at that time and currently denies any residual symptoms from that incident.  She continues to be noncompliant on her medications as prescribed for her mood disorder.  She ran out of her Lamictal  and may have been taking only once a day although it was prescribed as twice a day when she was taking it.  She was also prescribed duloxetine  in October 2025 for anxiety symptoms.  Although she does not take it every day, she continues to take it whenever she remembers.  She currently denies any suicidality, homicidality or perceptual disturbances.  According to spouse she does struggle with her sleep.  She does not have a good sleep hygiene and sleep is interrupted throughout the night.  Patient reports she does not take her Ambien  as prescribed although it does help when she takes it.  Recently she was evaluated for a rash diagnosed as widespread poison ivy dermatitis-dated 03/22/2024.  She is currently on prednisone  tapering dose.  She reports the rash is improving.  She is agreeable to a trial of Lamictal  to manage her current mood swings/anxiety as well as sleep problems.  She also agrees to be more compliant with her medications.  Spouse agrees to assist patient with taking her medications on a daily  basis like setting up a schedule, setting up her pillbox, setting up reminders on her phone.      Visit Diagnosis:    ICD-10-CM   1. Bipolar 1 disorder, mixed, mild (HCC)  F31.61 lamoTRIgine  (LAMICTAL ) 25 MG tablet    DULoxetine  (CYMBALTA ) 30 MG capsule    2. Insomnia due to medical condition  G47.01 lamoTRIgine  (LAMICTAL ) 25 MG tablet    zolpidem  (AMBIEN ) 5 MG tablet   Mood , pain      Past Psychiatric History: I have reviewed past psychiatric history from progress note on 05/02/2018.  Past trials of Abilify , Seroquel   Past Medical History:  Past Medical History:  Diagnosis Date   Bipolar disorder (HCC)    takes Abilify    Depression    Dysrhythmia 10/2016   LBBB on ekg...nothing to compare it to.   Heart murmur    Hypertension    Nephrolithiasis 2000   s/p lithotripsy   Psychotic disorder (HCC)    mania   Wears contact lenses     Past Surgical History:  Procedure Laterality Date   ARTHRODESIS METATARSALPHALANGEAL JOINT (MTPJ) Left 03/09/2023   Procedure: ARTHRODESIS METATARSALPHALANGEAL JOINT (MTPJ);  Surgeon: Anell Baptist, DPM;  Location: [facility];  Service: Podiatry;  Laterality: Left;   CAPSULOTOMY METATARSOPHALANGEAL Left 03/09/2023   Procedure: CAPSULOTOMY METATARSOPHALANGEAL SECOND;  Surgeon: Anell Baptist, DPM;  Location: [facility];  Service: Podiatry;  Laterality: Left;   EXTRACORPOREAL SHOCK WAVE LITHOTRIPSY Left 01/06/2017   Procedure: EXTRACORPOREAL SHOCK WAVE LITHOTRIPSY (ESWL);  Surgeon: Dustin Gimenez, MD;  Location: [facility];  Service: Urology;  Laterality: Left;   FLEXOR TENOTOMY  Left 03/09/2023   Procedure: FLEXOR TENOTOMY;  Surgeon: Anell Baptist, DPM;  Location: [facility];  Service: Podiatry;  Laterality: Left;   LITHOTRIPSY  2000    Family Psychiatric History: I have reviewed family psychiatric history from progress note on 05/02/2018.  Family History:  Family History  Problem Relation Age of Onset   Hyperlipidemia  Mother    Stroke Mother    Hypertension Mother    Hyperlipidemia Father    Hypertension Father    Cancer Sister        breast   Breast cancer Sister 2   Fibromyalgia Sister    Hypertension Sister    Heart Problems Sister    Hypertension Sister    Heart disease Brother        myocardial infarction   Heart attack Brother    Hypertension Brother    Prostate cancer Neg Hx    Kidney cancer Neg Hx    Bladder Cancer Neg Hx     Social History: I have reviewed social history from progress note on 05/02/2018. Social History   Socioeconomic History   Marital status: Married    Spouse name: richard   Number of children: 3   Years of education: Not on file   Highest education level: Some college, no degree  Occupational History    Comment: retired  Tobacco Use   Smoking status: Never   Smokeless tobacco: Never  Vaping Use   Vaping status: Never Used  Substance and Sexual Activity   Alcohol use: Yes    Alcohol/week: 0.0 - 2.0 standard drinks of alcohol    Comment: 2-3 times weekly   Drug use: No   Sexual activity: Yes    Birth control/protection: Post-menopausal, None  Other Topics Concern   Not on file  Social History Narrative   Patient lives at home with her husband of 41 years. They have 3 adult children (2 boys, 1 girl). They recently moved back to [place] in January. 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 Patient has also worked as a Occupational hygienist.   Social Drivers of Health   Financial Resource Strain: High Risk (03/01/2023)   Overall Financial Resource Strain (CARDIA)    Difficulty of Paying Living Expenses: Hard  Food Insecurity: Food Insecurity Present (03/01/2023)   Hunger Vital Sign    Worried About Running Out of Food in the Last Year: Sometimes true    Ran Out of Food in the Last Year: Never true  Transportation Needs: No Transportation Needs (03/01/2023)   PRAPARE - Administrator, Civil Service (Medical): No    Lack of  Transportation (Non-Medical): No  Physical Activity: Inactive (03/01/2023)   Exercise Vital Sign    Days of Exercise per Week: 0 days    Minutes of Exercise per Session: 0 min  Stress: No Stress Concern Present (03/01/2023)   [employer] of Occupational Health - Occupational Stress Questionnaire    Feeling of Stress : Not at all  Social Connections: Socially Integrated (03/01/2023)   Social Connection and Isolation Panel [NHANES]    Frequency of Communication with Friends and Family: More than three times a week    Frequency of Social Gatherings with Friends and Family: Twice a week    Attends Religious Services: More than 4 times per year    Active Member of  Clubs or Organizations: Yes    Attends Engineer, structural: More than 4 times per year    Marital Status: Married    Allergies:  Allergies  Allergen Reactions   Penicillins Shortness Of Breath    As a child    Metabolic Disorder Labs: Lab Results  Component Value Date   HGBA1C 5.2 03/02/2024   MPG 103 03/02/2024   MPG 94 01/20/2018   Lab Results  Component Value Date   PROLACTIN 3.2 (L) 01/20/2018   Lab Results  Component Value Date   CHOL 126 03/02/2024   TRIG 110 03/02/2024   HDL 36 (L) 03/02/2024   CHOLHDL 3.5 03/02/2024   VLDL 22 03/02/2024   LDLCALC 68 03/02/2024   LDLCALC 83 01/02/2024   Lab Results  Component Value Date   TSH 0.68 01/02/2024   TSH 0.89 12/01/2021    Therapeutic Level Labs: No results found for: "LITHIUM" No results found for: "VALPROATE" Lab Results  Component Value Date   CBMZ 2.9 (L) 01/07/2014    Current Medications: Current Outpatient Medications  Medication Sig Dispense Refill   amLODipine  (NORVASC ) 2.5 MG tablet TAKE 1 TABLET BY MOUTH AT BEDTIME 90 tablet 1   aspirin  EC 81 MG tablet Take 1 tablet (81 mg total) by mouth daily. Swallow whole.     atorvastatin  (LIPITOR) 10 MG tablet Take 1 tablet (10 mg total) by mouth daily. 30 tablet 0   Cyanocobalamin   (VITAMIN B-12 PO) Take by mouth daily.     dicyclomine  (BENTYL ) 10 MG capsule Take 1 capsule (10 mg total) by mouth 4 (four) times daily -  before meals and at bedtime. 120 capsule 3   DULoxetine  (CYMBALTA ) 30 MG capsule Take 1 capsule (30 mg total) by mouth daily. 30 capsule 1   gabapentin  (NEURONTIN ) 100 MG capsule Take 1 capsule (100 mg total) by mouth 3 (three) times daily. 90 capsule 3   hydrocortisone  cream 0.5 % Apply 1 Application topically 2 (two) times daily. 28.4 g 0   irbesartan  (AVAPRO ) 300 MG tablet Take 1 tablet (300 mg total) by mouth daily. 90 tablet 0   ketoconazole  (NIZORAL ) 2 % cream Apply 1 Application topically 2 (two) times daily. 60 g 1   melatonin 5 MG TABS Take 5 mg by mouth.     metoprolol  succinate (TOPROL -XL) 50 MG 24 hr tablet Take 1 tablet (50 mg total) by mouth daily with or immediately following a meal. 30 tablet 0   nystatin  (MYCOSTATIN /NYSTOP ) powder Apply 1 Application topically 2 (two) times daily. To rash until resolved. 15 g 1   predniSONE  (DELTASONE ) 20 MG tablet Take 2 tablets (40 mg total) by mouth daily with breakfast for 7 days, THEN 1.5 tablets (30 mg total) daily with breakfast for 7 days, THEN 1 tablet (20 mg total) daily with breakfast for 7 days, THEN 0.5 tablets (10 mg total) daily with breakfast for 7 days. 35 tablet 0   Spacer/Aero-Holding Chambers DEVI 1 Units by Does not apply route daily. 1 Units 0   lamoTRIgine  (LAMICTAL ) 25 MG tablet Take 1 tablet (25 mg total) by mouth daily for 15 days, THEN 1 tablet (25 mg total) 2 (two) times daily for 15 days. 45 tablet 0   Multiple Vitamin (MULTIVITAMIN) tablet Take 1 tablet by mouth daily. (Patient not taking: Reported on 04/04/2024)     zolpidem  (AMBIEN ) 5 MG tablet Take 0.5-1 tablets (2.5-5 mg total) by mouth at bedtime as needed for sleep. 30 tablet 3  No current facility-administered medications for this visit.     Musculoskeletal: Strength & Muscle Tone: within normal limits Gait & Station:  normal Patient leans: N/A  Psychiatric Specialty Exam: Review of Systems  Psychiatric/Behavioral:  Positive for sleep disturbance. The patient is nervous/anxious.     Blood pressure 118/82, pulse (!) 106, temperature 97.8 F (36.6 C), temperature source Temporal, height 5' (1.524 m), weight 178 lb (80.7 kg), SpO2 99%.Body mass index is 34.76 kg/m.  General Appearance: Casual  Eye Contact:  Good  Speech:  Clear and Coherent  Volume:  Normal  Mood:  Anxious  Affect:  Congruent  Thought Process:  Goal Directed and Descriptions of Associations: Intact  Orientation:  Full (Time, Place, and Person)  Thought Content: Logical   Suicidal Thoughts:  No  Homicidal Thoughts:  No  Memory:  Immediate;   Fair Recent;   Fair Remote;   Fair  Judgement:  Fair  Insight:  Fair  Psychomotor Activity:  Normal  Concentration:  Concentration: Fair and Attention Span: Fair  Recall:  [employer] of Knowledge: Fair  Language: Fair  Akathisia:  No  Handed:  Right  AIMS (if indicated): done  Assets:  Desire for Improvement Housing Intimacy Social Support  ADL's:  Intact  Cognition: WNL  Sleep:  Fair   Screenings: Midwife Visit from 06/17/2022 in [facility] Office Visit from 03/27/2021 in [facility]  AIMS Total Score 0 1      AUDIT    Flowsheet Row Clinical Support from 03/01/2023 in [facility] [employer] [facility]  Alcohol Use Disorder Identification Test Final Score (AUDIT) 3      GAD-7    Flowsheet Row Office Visit from 04/04/2024 in [facility] Office Visit from 09/08/2023 in [facility] Office Visit from 12/01/2022 in [facility] Office Visit from 10/26/2022 in [facility] Office Visit from 06/17/2022 in  [facility]  Total GAD-7 Score 2 6 4 13 2       PHQ2-9    Flowsheet Row Office Visit from 04/04/2024 in [facility] Office Visit from 04/02/2024 in [facility] Visit from 01/02/2024 in [facility] [employer] Visit from 12/02/2023 in [facility] [employer] Visit from 09/08/2023 in [facility]  PHQ-2 Total Score 0 0 0 0 1      Flowsheet Row ED from 03/01/2024 in [facility] Visit from 09/29/2023 in [facility] Office Visit from 09/08/2023 in [facility]  C-SSRS RISK CATEGORY No Risk No Risk No Risk        Assessment and Plan: Soheila Korba is a 73 year old Caucasian female who has a history of bipolar disorder, presented with continued mood symptoms, sleep problems, currently noncompliant on medication regimen, discussed assessment and plan as noted below.  Plan Bipolar disorder type I mixed mild-unstable Patient with ongoing mood symptoms, currently noncompliant on Lamictal .  Agreeable to retrial of Lamictal . - Restart Lamictal  25 mg daily for 2 weeks and then increase to 25 mg twice a day. - Continue Cymbalta  30 mg daily.  Encouraged compliance.  Insomnia-unstable Currently struggling with sleep, due to noncompliance  with medication as well as lack of sleep hygiene as well as likely due to her comorbid mood disorder and being on prednisone . - Continue sleep hygiene techniques. - Continue Ambien  2.5-5 mg at bedtime as needed. - Patient to set a bedtime routine as well as set up the scheduled bedtime and to wake up time and take her medications regularly.  Reviewed labs including CBC with differential-within normal limits  dated 03/01/2024, sodium-dated 04/02/2024-137, LFT dated 03/01/2024-within normal limits.  Collateral information obtained from spouse.  Follow-up Follow-up in clinic in 3 to 4 weeks or sooner if needed.   Collaboration of Care: Collaboration of Care: Other I have reviewed notes per Dr. Madelon Scheuermann dated 04/02/2024-patient with benign positional vertigo, nephrolithiasis-CT scan dated 03/01/2024-no acute intracranial abnormality.  Patient/Guardian was advised Release of Information must be obtained prior to any record release in order to collaborate their care with an outside provider. Patient/Guardian was advised if they have not already done so to contact the registration department to sign all necessary forms in order for us  to release information regarding their care.   Consent: Patient/Guardian gives verbal consent for treatment and assignment of benefits for services provided during this visit. Patient/Guardian expressed understanding and agreed to proceed.   This note was generated in part or whole with voice recognition software. Voice recognition is usually quite accurate but there are transcription errors that can and very often do occur. I apologize for any typographical errors that were not detected and corrected.    Evette Diclemente, MD 04/04/2024, 12:38 PM

## 2024-04-05 ENCOUNTER — Telehealth: Payer: Self-pay

## 2024-04-05 ENCOUNTER — Encounter (HOSPITAL_COMMUNITY): Payer: Self-pay

## 2024-04-05 NOTE — Telephone Encounter (Signed)
 The prior authorization has been completed for the ECHO tomorrow.

## 2024-04-05 NOTE — Telephone Encounter (Signed)
 Message has been sent to our STAT/URGENT referral team.

## 2024-04-05 NOTE — Telephone Encounter (Signed)
 Copied from CRM [id number]. Topic: General - Other >> Apr 05, 2024  9:30 AM Martinique E wrote: Reason for CRM: [facility] called in stating that they have not received the patient's prior authorization for her echocardiogram scheduled for tomorrow 5/9. Prior Siegfried Dress will need to be completed or patient's echocardiogram will have to be cancelled.

## 2024-04-06 ENCOUNTER — Ambulatory Visit: Attending: Internal Medicine

## 2024-04-06 DIAGNOSIS — G459 Transient cerebral ischemic attack, unspecified: Secondary | ICD-10-CM | POA: Diagnosis not present

## 2024-04-06 LAB — ECHOCARDIOGRAM COMPLETE
AR max vel: 2.17 cm2
AV Area VTI: 2.18 cm2
AV Area mean vel: 2.26 cm2
AV Mean grad: 5 mmHg
AV Peak grad: 10.2 mmHg
Ao pk vel: 1.6 m/s
Area-P 1/2: 3.48 cm2
Calc EF: 58.3 %
S' Lateral: 2.8 cm
Single Plane A2C EF: 60.6 %
Single Plane A4C EF: 54.4 %

## 2024-04-11 ENCOUNTER — Ambulatory Visit: Payer: Self-pay | Admitting: Internal Medicine

## 2024-04-12 ENCOUNTER — Encounter: Payer: Self-pay | Admitting: Internal Medicine

## 2024-04-12 ENCOUNTER — Ambulatory Visit

## 2024-04-13 ENCOUNTER — Telehealth: Payer: Self-pay | Admitting: *Deleted

## 2024-04-13 DIAGNOSIS — D2271 Melanocytic nevi of right lower limb, including hip: Secondary | ICD-10-CM | POA: Diagnosis not present

## 2024-04-13 DIAGNOSIS — Z08 Encounter for follow-up examination after completed treatment for malignant neoplasm: Secondary | ICD-10-CM | POA: Diagnosis not present

## 2024-04-13 DIAGNOSIS — L821 Other seborrheic keratosis: Secondary | ICD-10-CM | POA: Diagnosis not present

## 2024-04-13 DIAGNOSIS — D2261 Melanocytic nevi of right upper limb, including shoulder: Secondary | ICD-10-CM | POA: Diagnosis not present

## 2024-04-13 DIAGNOSIS — D2262 Melanocytic nevi of left upper limb, including shoulder: Secondary | ICD-10-CM | POA: Diagnosis not present

## 2024-04-13 DIAGNOSIS — Z85828 Personal history of other malignant neoplasm of skin: Secondary | ICD-10-CM | POA: Diagnosis not present

## 2024-04-13 DIAGNOSIS — D225 Melanocytic nevi of trunk: Secondary | ICD-10-CM | POA: Diagnosis not present

## 2024-04-13 DIAGNOSIS — D2272 Melanocytic nevi of left lower limb, including hip: Secondary | ICD-10-CM | POA: Diagnosis not present

## 2024-04-13 NOTE — Telephone Encounter (Signed)
 Scheduled patient for 03/2024 at 3 PM right sided breast pain worse with cough , no palpable not just sharpe pain when she cough's. Also discussed with patient the My Chart message sent that Dr. Madelon Scheuermann is concerned for her health and many things were put in place at visit to protect her health. Confirmed patient urology referral has taken place , patient has received ECHO and currently wearing monitor. Patient apologized for how she was feeling in her message, I advised no need for apologies I assured patient provider would never want a patient to feel that they was unimportant.

## 2024-04-13 NOTE — Telephone Encounter (Signed)
 See phone note today's date 04/13/24

## 2024-04-16 ENCOUNTER — Other Ambulatory Visit: Payer: Self-pay | Admitting: Cardiovascular Disease

## 2024-04-16 ENCOUNTER — Other Ambulatory Visit: Payer: Self-pay

## 2024-04-16 ENCOUNTER — Other Ambulatory Visit: Payer: Self-pay | Admitting: Internal Medicine

## 2024-04-16 MED FILL — Atorvastatin Calcium Tab 10 MG (Base Equivalent): ORAL | 15 days supply | Qty: 15 | Fill #0 | Status: CN

## 2024-04-16 MED FILL — Amlodipine Besylate Tab 2.5 MG (Base Equivalent): ORAL | 90 days supply | Qty: 90 | Fill #0 | Status: CN

## 2024-04-17 ENCOUNTER — Ambulatory Visit (INDEPENDENT_AMBULATORY_CARE_PROVIDER_SITE_OTHER): Admitting: Internal Medicine

## 2024-04-17 ENCOUNTER — Other Ambulatory Visit: Payer: Self-pay

## 2024-04-17 ENCOUNTER — Encounter: Payer: Self-pay | Admitting: Internal Medicine

## 2024-04-17 ENCOUNTER — Ambulatory Visit (INDEPENDENT_AMBULATORY_CARE_PROVIDER_SITE_OTHER)

## 2024-04-17 VITALS — BP 130/78 | HR 92 | Ht 60.0 in | Wt 176.4 lb

## 2024-04-17 DIAGNOSIS — N644 Mastodynia: Secondary | ICD-10-CM | POA: Diagnosis not present

## 2024-04-17 DIAGNOSIS — R0781 Pleurodynia: Secondary | ICD-10-CM

## 2024-04-17 DIAGNOSIS — Z79899 Other long term (current) drug therapy: Secondary | ICD-10-CM

## 2024-04-17 DIAGNOSIS — H52223 Regular astigmatism, bilateral: Secondary | ICD-10-CM | POA: Diagnosis not present

## 2024-04-17 DIAGNOSIS — R053 Chronic cough: Secondary | ICD-10-CM | POA: Diagnosis not present

## 2024-04-17 DIAGNOSIS — R059 Cough, unspecified: Secondary | ICD-10-CM | POA: Diagnosis not present

## 2024-04-17 DIAGNOSIS — F3170 Bipolar disorder, currently in remission, most recent episode unspecified: Secondary | ICD-10-CM

## 2024-04-17 DIAGNOSIS — R079 Chest pain, unspecified: Secondary | ICD-10-CM | POA: Diagnosis not present

## 2024-04-17 DIAGNOSIS — G459 Transient cerebral ischemic attack, unspecified: Secondary | ICD-10-CM

## 2024-04-17 DIAGNOSIS — H524 Presbyopia: Secondary | ICD-10-CM | POA: Diagnosis not present

## 2024-04-17 DIAGNOSIS — U099 Post covid-19 condition, unspecified: Secondary | ICD-10-CM | POA: Diagnosis not present

## 2024-04-17 DIAGNOSIS — Z1231 Encounter for screening mammogram for malignant neoplasm of breast: Secondary | ICD-10-CM

## 2024-04-17 DIAGNOSIS — H5213 Myopia, bilateral: Secondary | ICD-10-CM | POA: Diagnosis not present

## 2024-04-17 NOTE — Addendum Note (Signed)
 Addended by: Thersia Flax on: 04/17/2024 03:23 PM   Modules accepted: Level of Service

## 2024-04-17 NOTE — Patient Instructions (Addendum)
  Your breast exam is normal.  Breast pain is never from breast cancer,  so a "screening mammogram" is all you need  The chest x ray is being done today because the pain you are having with cough may be due to something in your lungs or on your rib   Your annual mammogram has been ordered . Tony Frederickson will not allow us  to schedule it for you,  so please  call to make your appointment [phone]

## 2024-04-17 NOTE — Progress Notes (Signed)
 Subjective:  Patient ID: Julia Cooke, female    DOB: 02/24/1951  Age: 73 y.o. MRN: [id number]  CC: The primary encounter diagnosis was Breast cancer screening by mammogram. Diagnoses of Pleuritic chest pain, Bipolar disorder in partial remission, most recent episode unspecified type (HCC), High risk medication use, Post-COVID chronic cough, Transient ischemic attack, and Breast pain were also pertinent to this visit.   HPI Julia Cooke presents for  Chief Complaint  Patient presents with   Breast Pain    Right breast pain when coughing   1) has been having deep right breast pain with cough for the last several days.  Cough is not productive and not frequent.  She is concerned that she may have breast cancer since her sister died of BRCA and does not remember when her last mammogram was done.   2) HTN:  she is taking the higher dose of amlodipine  since her last visit but not checking her BP.    3) TIA:  currently wearing ZIO Monitor which I ordered  at last visit and schedule for ECHO by Dr Alvenia Aus  She does not want to discuss the mychart message she sent last week     Outpatient Medications Prior to Visit  Medication Sig Dispense Refill   amLODipine  (NORVASC ) 2.5 MG tablet Take 1 tablet (2.5 mg total) by mouth at bedtime. 90 tablet 1   aspirin  EC 81 MG tablet Take 1 tablet (81 mg total) by mouth daily. Swallow whole.     atorvastatin  (LIPITOR) 10 MG tablet Take 1 tablet (10 mg total) by mouth daily. Please schedule appointment for future refills 15 tablet 0   Cyanocobalamin  (VITAMIN B-12 PO) Take by mouth daily.     dicyclomine  (BENTYL ) 10 MG capsule Take 1 capsule (10 mg total) by mouth 4 (four) times daily -  before meals and at bedtime. 120 capsule 3   DULoxetine  (CYMBALTA ) 30 MG capsule Take 1 capsule (30 mg total) by mouth daily. 30 capsule 1   hydrocortisone  cream 0.5 % Apply 1 Application topically 2 (two) times daily. 28.4 g 0   irbesartan  (AVAPRO ) 300 MG tablet Take 1  tablet (300 mg total) by mouth daily. 90 tablet 0   ketoconazole  (NIZORAL ) 2 % cream Apply 1 Application topically 2 (two) times daily. 60 g 1   lamoTRIgine  (LAMICTAL ) 25 MG tablet Take 1 tablet (25 mg total) by mouth daily for 15 days, THEN 1 tablet (25 mg total) 2 (two) times daily for 15 days. 45 tablet 0   metoprolol  succinate (TOPROL -XL) 50 MG 24 hr tablet Take 1 tablet (50 mg total) by mouth daily with or immediately following a meal. 30 tablet 0   Multiple Vitamin (MULTIVITAMIN) tablet Take 1 tablet by mouth daily.     nystatin  (MYCOSTATIN /NYSTOP ) powder Apply 1 Application topically 2 (two) times daily. To rash until resolved. 15 g 1   Spacer/Aero-Holding Chambers DEVI 1 Units by Does not apply route daily. 1 Units 0   zolpidem  (AMBIEN ) 5 MG tablet Take 0.5-1 tablets (2.5-5 mg total) by mouth at bedtime as needed for sleep. 30 tablet 3   gabapentin  (NEURONTIN ) 100 MG capsule Take 1 capsule (100 mg total) by mouth 3 (three) times daily. 90 capsule 3   melatonin 5 MG TABS Take 5 mg by mouth. (Patient not taking: Reported on 04/17/2024)     predniSONE  (DELTASONE ) 20 MG tablet Take 2 tablets (40 mg total) by mouth daily with breakfast for 7 days, THEN 1.5  tablets (30 mg total) daily with breakfast for 7 days, THEN 1 tablet (20 mg total) daily with breakfast for 7 days, THEN 0.5 tablets (10 mg total) daily with breakfast for 7 days. 35 tablet 0   No facility-administered medications prior to visit.    Review of Systems;  Patient denies headache, fevers, malaise, unintentional weight loss, skin rash, eye pain, sinus congestion and sinus pain, sore throat, dysphagia,  hemoptysis , cough, dyspnea, wheezing, chest pain, palpitations, orthopnea, edema, abdominal pain, nausea, melena, diarrhea, constipation, flank pain, dysuria, hematuria, urinary  Frequency, nocturia, numbness, tingling, seizures,  Focal weakness, Loss of consciousness,  Tremor, insomnia, depression, anxiety, and suicidal ideation.       Objective:  BP 130/78   Pulse 92   Ht 5' (1.524 m)   Wt 176 lb 6.4 oz (80 kg)   SpO2 97%   BMI 34.45 kg/m   BP Readings from Last 3 Encounters:  04/17/24 130/78  04/04/24 118/82  04/02/24 (!) 140/78    Wt Readings from Last 3 Encounters:  04/17/24 176 lb 6.4 oz (80 kg)  04/04/24 178 lb (80.7 kg)  04/02/24 179 lb 12.8 oz (81.6 kg)    Physical Exam  Lab Results  Component Value Date   HGBA1C 5.2 03/02/2024   HGBA1C 5.0 01/02/2024   HGBA1C 5.2 03/01/2023    Lab Results  Component Value Date   CREATININE 0.82 04/02/2024   CREATININE 0.79 03/01/2024   CREATININE 0.86 01/02/2024    Lab Results  Component Value Date   WBC 8.1 03/01/2024   HGB 14.3 03/01/2024   HCT 41.5 03/01/2024   PLT 240 03/01/2024   GLUCOSE 140 (H) 04/02/2024   CHOL 126 03/02/2024   TRIG 110 03/02/2024   HDL 36 (L) 03/02/2024   LDLDIRECT 98.0 01/02/2024   LDLCALC 68 03/02/2024   ALT 18 03/01/2024   AST 20 03/01/2024   NA 137 04/02/2024   K 4.6 04/02/2024   CL 99 04/02/2024   CREATININE 0.82 04/02/2024   BUN 24 (H) 04/02/2024   CO2 30 04/02/2024   TSH 0.68 01/02/2024   HGBA1C 5.2 03/02/2024   MICROALBUR 0.8 01/02/2024    MR BRAIN WO CONTRAST Result Date: 03/01/2024 CLINICAL DATA:  Left-sided weakness.  Transient ischemic attack. EXAM: MRI HEAD WITHOUT CONTRAST TECHNIQUE: Multiplanar, multiecho pulse sequences of the brain and surrounding structures were obtained without intravenous contrast. COMPARISON:  None Available. FINDINGS: Brain: No acute infarct, mass effect or extra-axial collection. No acute or chronic hemorrhage. Minimal multifocal hyperintense T2-weight signal within the white matter. The midline structures are normal. Vascular: Normal flow voids. Skull and upper cervical spine: Normal calvarium and skull base. Visualized upper cervical spine and soft tissues are normal. Sinuses/Orbits:No paranasal sinus fluid levels or advanced mucosal thickening. No mastoid or middle ear  effusion. Normal orbits. IMPRESSION: 1. No acute intracranial abnormality. 2. Minimal multifocal hyperintense T2-weight signal within the white matter, nonspecific but may be seen in the setting of migraine headaches or early chronic small vessel ischemia. Electronically Signed   By: Juanetta Nordmann M.D.   On: 03/01/2024 19:47   CT HEAD WO CONTRAST ( ) Result Date: 03/01/2024 CLINICAL DATA:  TIA.  Confusion and lethargy. EXAM: CT HEAD WITHOUT CONTRAST TECHNIQUE: Contiguous axial images were obtained from the base of the skull through the vertex without intravenous contrast. RADIATION DOSE REDUCTION: This exam was performed according to the departmental dose-optimization program which includes automated exposure control, adjustment of the mA and/or kV according to patient size and/or  use of iterative reconstruction technique. COMPARISON:  Head CT 02/02/2013 FINDINGS: Brain: No evidence of acute infarction, hemorrhage, hydrocephalus, extra-axial collection or mass lesion/mass effect. Vascular: No hyperdense vessel or unexpected calcification. Skull: Normal. Negative for fracture or focal lesion. Sinuses/Orbits: No acute finding. Other: None. IMPRESSION: No acute intracranial abnormality. Electronically Signed   By: Tyron Gallon M.D.   On: 03/01/2024 15:14   CT Renal Stone Study Result Date: 03/01/2024 CLINICAL DATA:  Right-sided flank pain. EXAM: CT ABDOMEN AND PELVIS WITHOUT CONTRAST TECHNIQUE: Multidetector CT imaging of the abdomen and pelvis was performed following the standard protocol without IV contrast. RADIATION DOSE REDUCTION: This exam was performed according to the departmental dose-optimization program which includes automated exposure control, adjustment of the mA and/or kV according to patient size and/or use of iterative reconstruction technique. COMPARISON:  CT abdomen/pelvis dated 10/02/2019. FINDINGS: Lower chest: No acute abnormality. Hepatobiliary: No focal liver abnormality is seen. No  gallstones, gallbladder wall thickening, or biliary dilatation. Pancreas: Unremarkable. No pancreatic ductal dilatation or surrounding inflammatory changes. Spleen: Normal in size without focal abnormality. Adrenals/Urinary Tract: Adrenal glands are unremarkable. There is a 4 mm calculus at the right ureterovesicular junction (3:79), with moderate-to-severe right hydroureteronephrosis. Additional nonobstructing punctate calculi in the right kidney. Several nonobstructing left-sided calculi measuring up to 4 mm. No left-sided ureteral calculi. No left-sided hydronephrosis. Stomach/Bowel: Stomach is within normal limits. Appendix appears normal. No evidence of bowel wall thickening, distention, or inflammatory changes. A few sigmoid colonic diverticula without evidence of acute diverticulitis. Vascular/Lymphatic: Aortic atherosclerosis. No enlarged abdominal or pelvic lymph nodes. Reproductive: Uterus and bilateral adnexa are unremarkable. Other: No abdominal wall hernia or abnormality. No abdominopelvic ascites. No intraperitoneal free air. Musculoskeletal: No acute osseous abnormality. No suspicious osseous lesion. Multilevel degenerative changes of the thoracolumbar spine. IMPRESSION: 1. 4 mm calculus at the right UVJ with moderate-to-severe right hydroureteronephrosis. 2. Additional bilateral nonobstructing renal calculi. 3. A few sigmoid colonic diverticula without evidence of acute diverticulitis. Electronically Signed   By: Mannie Seek M.D.   On: 03/01/2024 15:09    Assessment & Plan:  .Breast cancer screening by mammogram -     3D Screening Mammogram, Left and Right; Future  Pleuritic chest pain -     DG Chest 2 View; Future  Bipolar disorder in partial remission, most recent episode unspecified type [facility]) Assessment & Plan: Based on review of notes from psychiatry she has been noncompliant with medications which may explain her mood disorder    High risk medication use Assessment &  Plan: She has been inconstent with use of lamictal .  Recent rash was NOT desquamative or suggestive of Derrel Flies Syndrome   Post-COVID chronic cough Assessment & Plan: Given her report of  right sided pleuritic pain,  I have repeated a chest ray, which was normal    Transient ischemic attack Assessment & Plan: Workup is in progress.  She is wearing a ZIO monitor and ECHO has been ordered as well    Breast pain Assessment & Plan: Occurs with cough .  Breast exam normal.       I spent 34 minutes on the day of this face to face encounter reviewing patient's  most recent visit with cardiology,  nephrology,  and neurology,  prior relevant surgical and non surgical procedures, recent  labs and imaging studies, counseling on weight management,  reviewing the assessment and plan with patient, and post visit ordering and reviewing of  diagnostics and therapeutics with patient  .   Follow-up: No  follow-ups on file.   Thersia Flax, MD

## 2024-04-18 DIAGNOSIS — N644 Mastodynia: Secondary | ICD-10-CM | POA: Insufficient documentation

## 2024-04-18 NOTE — Assessment & Plan Note (Signed)
 Workup is in progress.  She is wearing a ZIO monitor and ECHO has been ordered as well

## 2024-04-18 NOTE — Assessment & Plan Note (Signed)
 She has been inconstent with use of lamictal .  Recent rash was NOT desquamative or suggestive of Derrel Flies Syndrome

## 2024-04-18 NOTE — Assessment & Plan Note (Signed)
 Occurs with cough .  Breast exam normal.

## 2024-04-18 NOTE — Assessment & Plan Note (Signed)
 Given her report of  right sided pleuritic pain,  I have repeated a chest ray, which was normal

## 2024-04-18 NOTE — Assessment & Plan Note (Signed)
 Based on review of notes from psychiatry she has been noncompliant with medications which may explain her mood disorder

## 2024-04-19 ENCOUNTER — Ambulatory Visit: Payer: Self-pay | Admitting: Internal Medicine

## 2024-04-25 ENCOUNTER — Inpatient Hospital Stay: Admitting: Internal Medicine

## 2024-04-26 ENCOUNTER — Other Ambulatory Visit: Payer: Self-pay

## 2024-04-27 ENCOUNTER — Other Ambulatory Visit: Payer: Self-pay

## 2024-04-27 MED FILL — Amlodipine Besylate Tab 2.5 MG (Base Equivalent): ORAL | 90 days supply | Qty: 90 | Fill #0 | Status: AC

## 2024-04-30 ENCOUNTER — Encounter: Payer: Self-pay | Admitting: Internal Medicine

## 2024-04-30 ENCOUNTER — Ambulatory Visit (INDEPENDENT_AMBULATORY_CARE_PROVIDER_SITE_OTHER): Admitting: Internal Medicine

## 2024-04-30 VITALS — BP 130/78 | HR 82 | Ht 60.0 in | Wt 172.8 lb

## 2024-04-30 DIAGNOSIS — F3170 Bipolar disorder, currently in remission, most recent episode unspecified: Secondary | ICD-10-CM | POA: Diagnosis not present

## 2024-04-30 DIAGNOSIS — N132 Hydronephrosis with renal and ureteral calculous obstruction: Secondary | ICD-10-CM

## 2024-04-30 DIAGNOSIS — G459 Transient cerebral ischemic attack, unspecified: Secondary | ICD-10-CM | POA: Diagnosis not present

## 2024-04-30 DIAGNOSIS — R42 Dizziness and giddiness: Secondary | ICD-10-CM | POA: Diagnosis not present

## 2024-04-30 DIAGNOSIS — M130 Polyarthritis, unspecified: Secondary | ICD-10-CM | POA: Diagnosis not present

## 2024-04-30 NOTE — Patient Instructions (Addendum)
 The referral to [facility]   to follow up on the large obstructing  kidney stone  has been made,  but they were unable to reach you to schedule.  please call Matilde Son your urologist to make this happen   Your ECHO did not show any valve problems and did not show anything abnormal that would increase your risk for stroke   Talk to Dr [name] about getting "nerve conduction studies" to determine if the numbness in your arm is from carpal tunnel vs cervical spine stenosis  Try sleeping on a pillow that provides "cervical support"

## 2024-04-30 NOTE — Progress Notes (Signed)
 Subjective:  Patient ID: Julia Cooke, female    DOB: Feb 04, 1951  Age: 73 y.o. MRN: [id number]  CC: The primary encounter diagnosis was Bipolar disorder in partial remission, most recent episode unspecified type (HCC). Diagnoses of Transient ischemic attack, Polyarthritis of shoulder, and Hydronephrosis concurrent with and due to calculi of kidney and ureter were also pertinent to this visit.   HPI Julia Cooke presents for  Chief Complaint  Patient presents with   Medical Management of Chronic Issues   Multiple issues brought up at last visit that required further workup including referrals   1) saw Rheum today,  for right shoulder .  Given an I/A cortisone injection in the A/C joint.  Arm numbness and neck pain as well.   Has a follow up appt made  2) TIA:  seeing Potter next week   3)  kidney stone   has not passed   no pain currently or at the time it was diagnosed.   Has not been scheduled with Urology.   4) vertigo resolved   5)  insomnia : using ambien    Posterior neck pain worse in the morning,  with numbness of right arm that is brought on by kntting for just 5 minutes.  Can' t lift arm      Outpatient Medications Prior to Visit  Medication Sig Dispense Refill   amLODipine  (NORVASC ) 2.5 MG tablet Take 1 tablet (2.5 mg total) by mouth at bedtime. 90 tablet 1   aspirin  EC 81 MG tablet Take 1 tablet (81 mg total) by mouth daily. Swallow whole.     atorvastatin  (LIPITOR) 10 MG tablet Take 1 tablet (10 mg total) by mouth daily. Please schedule appointment for future refills 15 tablet 0   Cyanocobalamin  (VITAMIN B-12 PO) Take by mouth daily.     dicyclomine  (BENTYL ) 10 MG capsule Take 1 capsule (10 mg total) by mouth 4 (four) times daily -  before meals and at bedtime. 120 capsule 3   DULoxetine  (CYMBALTA ) 30 MG capsule Take 1 capsule (30 mg total) by mouth daily. 30 capsule 1   hydrocortisone  cream 0.5 % Apply 1 Application topically 2 (two) times daily. 28.4 g 0    irbesartan  (AVAPRO ) 300 MG tablet Take 1 tablet (300 mg total) by mouth daily. 90 tablet 0   ketoconazole  (NIZORAL ) 2 % cream Apply 1 Application topically 2 (two) times daily. 60 g 1   lamoTRIgine  (LAMICTAL ) 25 MG tablet Take 1 tablet (25 mg total) by mouth daily for 15 days, THEN 1 tablet (25 mg total) 2 (two) times daily for 15 days. 45 tablet 0   metoprolol  succinate (TOPROL -XL) 50 MG 24 hr tablet Take 1 tablet (50 mg total) by mouth daily with or immediately following a meal. 30 tablet 0   Multiple Vitamin (MULTIVITAMIN) tablet Take 1 tablet by mouth daily.     nystatin  (MYCOSTATIN /NYSTOP ) powder Apply 1 Application topically 2 (two) times daily. To rash until resolved. 15 g 1   Spacer/Aero-Holding Chambers DEVI 1 Units by Does not apply route daily. 1 Units 0   zolpidem  (AMBIEN ) 5 MG tablet Take 0.5-1 tablets (2.5-5 mg total) by mouth at bedtime as needed for sleep. 30 tablet 3   No facility-administered medications prior to visit.    Review of Systems;  Patient denies headache, fevers, malaise, unintentional weight loss, skin rash, eye pain, sinus congestion and sinus pain, sore throat, dysphagia,  hemoptysis , cough, dyspnea, wheezing, chest pain, palpitations, orthopnea, edema, abdominal pain,  nausea, melena, diarrhea, constipation, flank pain, dysuria, hematuria, urinary  Frequency, nocturia, numbness, tingling, seizures,  Focal weakness, Loss of consciousness,  Tremor, insomnia, depression, anxiety, and suicidal ideation.      Objective:  BP 130/78   Pulse 82   Ht 5' (1.524 m)   Wt 172 lb 12.8 oz (78.4 kg)   SpO2 98%   BMI 33.75 kg/m   BP Readings from Last 3 Encounters:  04/30/24 130/78  04/17/24 130/78  04/04/24 118/82    Wt Readings from Last 3 Encounters:  04/30/24 172 lb 12.8 oz (78.4 kg)  04/17/24 176 lb 6.4 oz (80 kg)  04/04/24 178 lb (80.7 kg)    Physical Exam Vitals reviewed.  Constitutional:      General: She is not in acute distress.    Appearance:  Normal appearance. She is normal weight. She is not ill-appearing, toxic-appearing or diaphoretic.  HENT:     Head: Normocephalic.  Eyes:     General: No scleral icterus.       Right eye: No discharge.        Left eye: No discharge.     Conjunctiva/sclera: Conjunctivae normal.  Cardiovascular:     Rate and Rhythm: Normal rate and regular rhythm.     Heart sounds: Normal heart sounds.  Pulmonary:     Effort: Pulmonary effort is normal. No respiratory distress.     Breath sounds: Normal breath sounds.  Musculoskeletal:        General: Normal range of motion.  Skin:    General: Skin is warm and dry.  Neurological:     General: No focal deficit present.     Mental Status: She is alert and oriented to person, place, and time. Mental status is at baseline.  Psychiatric:        Mood and Affect: Mood normal.        Behavior: Behavior normal.        Thought Content: Thought content normal.        Judgment: Judgment normal.    Lab Results  Component Value Date   HGBA1C 5.2 03/02/2024   HGBA1C 5.0 01/02/2024   HGBA1C 5.2 03/01/2023    Lab Results  Component Value Date   CREATININE 0.82 04/02/2024   CREATININE 0.79 03/01/2024   CREATININE 0.86 01/02/2024    Lab Results  Component Value Date   WBC 8.1 03/01/2024   HGB 14.3 03/01/2024   HCT 41.5 03/01/2024   PLT 240 03/01/2024   GLUCOSE 140 (H) 04/02/2024   CHOL 126 03/02/2024   TRIG 110 03/02/2024   HDL 36 (L) 03/02/2024   LDLDIRECT 98.0 01/02/2024   LDLCALC 68 03/02/2024   ALT 18 03/01/2024   AST 20 03/01/2024   NA 137 04/02/2024   K 4.6 04/02/2024   CL 99 04/02/2024   CREATININE 0.82 04/02/2024   BUN 24 (H) 04/02/2024   CO2 30 04/02/2024   TSH 0.68 01/02/2024   HGBA1C 5.2 03/02/2024   MICROALBUR 0.8 01/02/2024    MR BRAIN WO CONTRAST Result Date: 03/01/2024 CLINICAL DATA:  Left-sided weakness.  Transient ischemic attack. EXAM: MRI HEAD WITHOUT CONTRAST TECHNIQUE: Multiplanar, multiecho pulse sequences of the  brain and surrounding structures were obtained without intravenous contrast. COMPARISON:  None Available. FINDINGS: Brain: No acute infarct, mass effect or extra-axial collection. No acute or chronic hemorrhage. Minimal multifocal hyperintense T2-weight signal within the white matter. The midline structures are normal. Vascular: Normal flow voids. Skull and upper cervical spine: Normal calvarium and skull  base. Visualized upper cervical spine and soft tissues are normal. Sinuses/Orbits:No paranasal sinus fluid levels or advanced mucosal thickening. No mastoid or middle ear effusion. Normal orbits. IMPRESSION: 1. No acute intracranial abnormality. 2. Minimal multifocal hyperintense T2-weight signal within the white matter, nonspecific but may be seen in the setting of migraine headaches or early chronic small vessel ischemia. Electronically Signed   By: Juanetta Nordmann M.D.   On: 03/01/2024 19:47   CT HEAD WO CONTRAST ( ) Result Date: 03/01/2024 CLINICAL DATA:  TIA.  Confusion and lethargy. EXAM: CT HEAD WITHOUT CONTRAST TECHNIQUE: Contiguous axial images were obtained from the base of the skull through the vertex without intravenous contrast. RADIATION DOSE REDUCTION: This exam was performed according to the departmental dose-optimization program which includes automated exposure control, adjustment of the mA and/or kV according to patient size and/or use of iterative reconstruction technique. COMPARISON:  Head CT 02/02/2013 FINDINGS: Brain: No evidence of acute infarction, hemorrhage, hydrocephalus, extra-axial collection or mass lesion/mass effect. Vascular: No hyperdense vessel or unexpected calcification. Skull: Normal. Negative for fracture or focal lesion. Sinuses/Orbits: No acute finding. Other: None. IMPRESSION: No acute intracranial abnormality. Electronically Signed   By: Tyron Gallon M.D.   On: 03/01/2024 15:14   CT Renal Stone Study Result Date: 03/01/2024 CLINICAL DATA:  Right-sided flank pain.  EXAM: CT ABDOMEN AND PELVIS WITHOUT CONTRAST TECHNIQUE: Multidetector CT imaging of the abdomen and pelvis was performed following the standard protocol without IV contrast. RADIATION DOSE REDUCTION: This exam was performed according to the departmental dose-optimization program which includes automated exposure control, adjustment of the mA and/or kV according to patient size and/or use of iterative reconstruction technique. COMPARISON:  CT abdomen/pelvis dated 10/02/2019. FINDINGS: Lower chest: No acute abnormality. Hepatobiliary: No focal liver abnormality is seen. No gallstones, gallbladder wall thickening, or biliary dilatation. Pancreas: Unremarkable. No pancreatic ductal dilatation or surrounding inflammatory changes. Spleen: Normal in size without focal abnormality. Adrenals/Urinary Tract: Adrenal glands are unremarkable. There is a 4 mm calculus at the right ureterovesicular junction (3:79), with moderate-to-severe right hydroureteronephrosis. Additional nonobstructing punctate calculi in the right kidney. Several nonobstructing left-sided calculi measuring up to 4 mm. No left-sided ureteral calculi. No left-sided hydronephrosis. Stomach/Bowel: Stomach is within normal limits. Appendix appears normal. No evidence of bowel wall thickening, distention, or inflammatory changes. A few sigmoid colonic diverticula without evidence of acute diverticulitis. Vascular/Lymphatic: Aortic atherosclerosis. No enlarged abdominal or pelvic lymph nodes. Reproductive: Uterus and bilateral adnexa are unremarkable. Other: No abdominal wall hernia or abnormality. No abdominopelvic ascites. No intraperitoneal free air. Musculoskeletal: No acute osseous abnormality. No suspicious osseous lesion. Multilevel degenerative changes of the thoracolumbar spine. IMPRESSION: 1. 4 mm calculus at the right UVJ with moderate-to-severe right hydroureteronephrosis. 2. Additional bilateral nonobstructing renal calculi. 3. A few sigmoid colonic  diverticula without evidence of acute diverticulitis. Electronically Signed   By: Mannie Seek M.D.   On: 03/01/2024 15:09    Assessment & Plan:  .Bipolar disorder in partial remission, most recent episode unspecified type [facility]) Assessment & Plan: Based on review of notes from psychiatry she has been noncompliant with medications which may explain her mood disorder .  She appears more calm today   Transient ischemic attack Assessment & Plan: Workup has included an uneventful ZIO monitor and normal ECHO .  Follo up ith neurology  is scheduled    Polyarthritis of shoulder Assessment & Plan: S/p cortison injection by Rheum. Doen today .  She reports sunjective Numbness of right arm unaccompanied by loss of strength based  on today's exam .    Hydronephrosis concurrent with and due to calculi of kidney and ureter Assessment & Plan: Chronicity unclerar given asymptomatic finding and normal CR.  Right sided,  noted on CT abd /pelvis in March during ER visit for AMS.  Obstructing UVJ stone noted,  no intervention per urology, no strainer given to patient and no urology follow up given.  Urology referral made one month ago and paitent still doesn't have appt.  Unchanged  GFR and  normal  plain abd film done  last month.  Patient is established with Matilde Son,  advised to call her office  to set up follow up  Lab Results  Component Value Date   NA 137 04/02/2024   K 4.6 04/02/2024   CL 99 04/02/2024   CO2 30 04/02/2024   Lab Results  Component Value Date   CREATININE 0.82 04/02/2024         I spent 34 minutes on the day of this face to face encounter reviewing patient's  most recent visit with cardiology,   rheumatology, and psychiatry, prior relevant surgical and non surgical procedures, recent  labs and imaging studies, ,  reviewing the assessment and plan with patient, and post visit ordering and reviewing of  diagnostics and therapeutics with patient  .   Follow-up:  Return in about 3 months (around 07/31/2024).   Thersia Flax, MD

## 2024-05-01 NOTE — Assessment & Plan Note (Signed)
 Workup has included an uneventful ZIO monitor and normal ECHO .  Follo up ith neurology  is scheduled

## 2024-05-01 NOTE — Assessment & Plan Note (Signed)
 S/p cortison injection by Rheum. Doen today .  She reports sunjective Numbness of right arm unaccompanied by loss of strength based on today's exam .

## 2024-05-01 NOTE — Assessment & Plan Note (Signed)
 Based on review of notes from psychiatry she has been noncompliant with medications which may explain her mood disorder .  She appears more calm today

## 2024-05-01 NOTE — Assessment & Plan Note (Signed)
 Chronicity unclerar given asymptomatic finding and normal CR.  Right sided,  noted on CT abd /pelvis in March during ER visit for AMS.  Obstructing UVJ stone noted,  no intervention per urology, no strainer given to patient and no urology follow up given.  Urology referral made one month ago and paitent still doesn't have appt.  Unchanged  GFR and  normal  plain abd film done  last month.  Patient is established with Matilde Son,  advised to call her office  to set up follow up  Lab Results  Component Value Date   NA 137 04/02/2024   K 4.6 04/02/2024   CL 99 04/02/2024   CO2 30 04/02/2024   Lab Results  Component Value Date   CREATININE 0.82 04/02/2024

## 2024-05-03 ENCOUNTER — Other Ambulatory Visit: Payer: Self-pay

## 2024-05-03 ENCOUNTER — Other Ambulatory Visit: Payer: Self-pay | Admitting: Urology

## 2024-05-03 ENCOUNTER — Ambulatory Visit
Admission: RE | Admit: 2024-05-03 | Discharge: 2024-05-03 | Disposition: A | Discharge status: Home / Self Care | Source: Ambulatory Visit | Attending: Internal Medicine | Admitting: Internal Medicine

## 2024-05-03 ENCOUNTER — Other Ambulatory Visit: Payer: Self-pay | Admitting: Psychiatry

## 2024-05-03 ENCOUNTER — Telehealth (INDEPENDENT_AMBULATORY_CARE_PROVIDER_SITE_OTHER): Payer: Self-pay | Admitting: Psychiatry

## 2024-05-03 ENCOUNTER — Other Ambulatory Visit: Payer: Self-pay | Admitting: Physician Assistant

## 2024-05-03 DIAGNOSIS — F3161 Bipolar disorder, current episode mixed, mild: Secondary | ICD-10-CM

## 2024-05-03 DIAGNOSIS — Z1231 Encounter for screening mammogram for malignant neoplasm of breast: Secondary | ICD-10-CM | POA: Diagnosis not present

## 2024-05-03 DIAGNOSIS — N201 Calculus of ureter: Secondary | ICD-10-CM

## 2024-05-03 DIAGNOSIS — G4701 Insomnia due to medical condition: Secondary | ICD-10-CM

## 2024-05-03 MED FILL — Atorvastatin Calcium Tab 10 MG (Base Equivalent): ORAL | 15 days supply | Qty: 15 | Fill #0 | Status: AC

## 2024-05-03 NOTE — Progress Notes (Signed)
 No response to text or video invite.  Attempted to contact patient multiple times at phone number [phone] -unable to complete call.

## 2024-05-04 ENCOUNTER — Ambulatory Visit
Admission: RE | Admit: 2024-05-04 | Discharge: 2024-05-04 | Disposition: A | Discharge status: Home / Self Care | Source: Ambulatory Visit | Attending: Urology | Admitting: Urology

## 2024-05-04 ENCOUNTER — Ambulatory Visit
Admission: RE | Admit: 2024-05-04 | Discharge: 2024-05-04 | Disposition: A | Discharge status: Home / Self Care | Attending: Urology | Admitting: Urology

## 2024-05-04 ENCOUNTER — Other Ambulatory Visit: Payer: Self-pay

## 2024-05-04 ENCOUNTER — Ambulatory Visit (INDEPENDENT_AMBULATORY_CARE_PROVIDER_SITE_OTHER): Admitting: Urology

## 2024-05-04 ENCOUNTER — Other Ambulatory Visit: Payer: Self-pay | Admitting: Psychiatry

## 2024-05-04 ENCOUNTER — Other Ambulatory Visit: Payer: Self-pay | Admitting: Urology

## 2024-05-04 VITALS — BP 141/76 | HR 65

## 2024-05-04 DIAGNOSIS — N201 Calculus of ureter: Secondary | ICD-10-CM | POA: Diagnosis not present

## 2024-05-04 DIAGNOSIS — R300 Dysuria: Secondary | ICD-10-CM | POA: Diagnosis not present

## 2024-05-04 DIAGNOSIS — N132 Hydronephrosis with renal and ureteral calculous obstruction: Secondary | ICD-10-CM

## 2024-05-04 DIAGNOSIS — N3946 Mixed incontinence: Secondary | ICD-10-CM | POA: Diagnosis not present

## 2024-05-04 DIAGNOSIS — G4701 Insomnia due to medical condition: Secondary | ICD-10-CM

## 2024-05-04 DIAGNOSIS — N200 Calculus of kidney: Secondary | ICD-10-CM | POA: Diagnosis not present

## 2024-05-04 DIAGNOSIS — I878 Other specified disorders of veins: Secondary | ICD-10-CM | POA: Diagnosis not present

## 2024-05-04 DIAGNOSIS — R109 Unspecified abdominal pain: Secondary | ICD-10-CM | POA: Diagnosis not present

## 2024-05-04 DIAGNOSIS — F3161 Bipolar disorder, current episode mixed, mild: Secondary | ICD-10-CM

## 2024-05-04 LAB — URINALYSIS, COMPLETE
Bilirubin, UA: NEGATIVE
Glucose, UA: NEGATIVE
Ketones, UA: NEGATIVE
Nitrite, UA: NEGATIVE
Protein,UA: NEGATIVE
RBC, UA: NEGATIVE
Specific Gravity, UA: 1.02 (ref 1.005–1.030)
Urobilinogen, Ur: 0.2 mg/dL (ref 0.2–1.0)
pH, UA: 6 (ref 5.0–7.5)

## 2024-05-04 LAB — MICROSCOPIC EXAMINATION: Epithelial Cells (non renal): >10 /HPF — AB (ref 0–10)

## 2024-05-04 MED ORDER — IRBESARTAN 300 MG PO TABS
300.0000 mg | ORAL_TABLET | Freq: Every day | ORAL | 0 refills | Status: DC
  Filled 2024-05-04: qty 15, 15d supply, fill #0

## 2024-05-04 MED ORDER — METOPROLOL SUCCINATE ER 50 MG PO TB24
50.0000 mg | ORAL_TABLET | Freq: Every day | ORAL | 0 refills | Status: DC
  Filled 2024-05-04: qty 15, 15d supply, fill #0

## 2024-05-04 MED FILL — Lamotrigine Tab 25 MG: ORAL | 30 days supply | Qty: 60 | Fill #0 | Status: AC

## 2024-05-04 NOTE — Patient Instructions (Signed)

## 2024-05-04 NOTE — Progress Notes (Signed)
 05/04/2024 9:47 PM   Julia Cooke [birth year] [id number]  Referring provider: Thersia Flax, MD [address] [place],  [place] [address]  Urological history: 1. 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 Nephrolithiasis - stone composition 55% calcium  oxalate monohydrate and 45% calcium  phosphate carbonate - ESWL (2018) - spontaneous passage of stones - CT renal stone (02/2024) - 4 mm right UVJ stone and bilateral nephrolithiasis  3. High risk hematuria - non-smoker - CTU (2020)  Chief Complaint  Patient presents with   Follow-up    HPI: Julia Cooke is a 73 y.o. woman who presents today for stone.   Previous records reviewed.   She was admitted for a TIA in April with an incidental finding of a 4 mm right ureteral stone on CT.   CBC was normal,   Serum creatinine of 0.79.   UA w/ micro heme.  Urine culture <10,000 colonies.    Today, she is not having any further renal colic.  She does not recall passing a stone, but she was not given a strainer.  Patient denies any modifying or aggravating factors.  Patient denies any recent UTI's, gross hematuria, dysuria or suprapubic/flank pain.  Patient denies any fevers, chills, nausea or vomiting.    She is having issues with incontinence and frequency, but these are not new symptoms.    UA yellow clear, specific gravity 1.020, pH 6.0, trace leukocytes, 0-5 WBC's, 0-2 RBC's, > 10 epithelial cells, mucus present and few bacteria.    KUB negative for stones.  PMH: Past Medical History:  Diagnosis Date   Bipolar disorder (HCC)    takes Abilify    Depression    Dysrhythmia 10/2016   LBBB on ekg...nothing to compare it to.   Heart murmur    Hypertension    Nephrolithiasis 2000   s/p lithotripsy   Psychotic disorder (HCC)    mania   Wears contact lenses     Surgical History: Past Surgical History:  Procedure Laterality Date   ARTHRODESIS  METATARSALPHALANGEAL JOINT (MTPJ) Left 03/09/2023   Procedure: ARTHRODESIS METATARSALPHALANGEAL JOINT (MTPJ);  Surgeon: Anell Baptist, DPM;  Location: [facility];  Service: Podiatry;  Laterality: Left;   CAPSULOTOMY METATARSOPHALANGEAL Left 03/09/2023   Procedure: CAPSULOTOMY METATARSOPHALANGEAL SECOND;  Surgeon: Anell Baptist, DPM;  Location: [facility];  Service: Podiatry;  Laterality: Left;   EXTRACORPOREAL SHOCK WAVE LITHOTRIPSY Left 01/06/2017   Procedure: EXTRACORPOREAL SHOCK WAVE LITHOTRIPSY (ESWL);  Surgeon: Dustin Gimenez, MD;  Location: [facility];  Service: Urology;  Laterality: Left;   FLEXOR TENOTOMY  Left 03/09/2023   Procedure: FLEXOR TENOTOMY;  Surgeon: Anell Baptist, DPM;  Location: [facility];  Service: Podiatry;  Laterality: Left;   LITHOTRIPSY  2000    Home Medications:  Allergies as of 05/04/2024       Reactions   Penicillins Shortness Of Breath   As a child        Medication List        Accurate as of May 04, 2024 11:59 PM. If you have any questions, ask your nurse or doctor.          amLODipine  2.5 MG tablet Commonly known as: NORVASC  Take 1 tablet (2.5 mg total) by mouth at bedtime.   aspirin  EC 81 MG tablet Take 1 tablet (81 mg total) by mouth daily. Swallow whole.   atorvastatin  10 MG tablet Commonly known as: LIPITOR Take  1 tablet (10 mg total) by mouth daily. Please schedule appointment for future refills   dicyclomine  10 MG capsule Commonly known as: BENTYL  Take 1 capsule (10 mg total) by mouth 4 (four) times daily -  before meals and at bedtime.   DULoxetine  30 MG capsule Commonly known as: Cymbalta  Take 1 capsule (30 mg total) by mouth daily.   hydrocortisone  cream 0.5 % Apply 1 Application topically 2 (two) times daily.   irbesartan  300 MG tablet Commonly known as: Avapro  Take 1 tablet (300 mg total) by mouth daily. Patient must schedule annual appointment for further refills second attempt   ketoconazole  2  % cream Commonly known as: NIZORAL  Apply 1 Application topically 2 (two) times daily.   lamoTRIgine  25 MG tablet Commonly known as: LAMICTAL  Take 1 tablet (25 mg total) by mouth 2 (two) times daily. What changed: See the new instructions. Changed by: Saramma Eappen   metoprolol  succinate 50 MG 24 hr tablet Commonly known as: TOPROL -XL Take 1 tablet (50 mg total) by mouth daily. Patient must schedule annual appointment for further refills second attempt   multivitamin tablet Take 1 tablet by mouth daily.   nystatin  powder Generic drug: nystatin  Apply 1 Application topically 2 (two) times daily. To rash until resolved.   Spacer/Aero-Holding [employer] 1 Units by Does not apply route daily.   VITAMIN B-12 PO Take by mouth daily.   zolpidem  5 MG tablet Commonly known as: AMBIEN  Take 0.5-1 tablets (2.5-5 mg total) by mouth at bedtime as needed for sleep.        Allergies:  Allergies  Allergen Reactions   Penicillins Shortness Of Breath    As a child    Family History: Family History  Problem Relation Age of Onset   Hyperlipidemia Mother    Stroke Mother    Hypertension Mother    Hyperlipidemia Father    Hypertension Father    Cancer Sister        breast   Breast cancer Sister 31   Fibromyalgia Sister    Hypertension Sister    Heart Problems Sister    Hypertension Sister    Heart disease Brother        myocardial infarction   Heart attack Brother    Hypertension Brother    Prostate cancer Neg Hx    Kidney cancer Neg Hx    Bladder Cancer Neg Hx     Social History:  reports that she has never smoked. She has never used smokeless tobacco. She reports current alcohol use. She reports that she does not use drugs.  ROS: Pertinent ROS in HPI  Physical Exam: BP (!) 141/76   Pulse 65   Constitutional:  Well nourished. Alert and oriented, No acute distress. HEENT: [place] AT, moist mucus membranes.  Trachea midline Cardiovascular: No clubbing, cyanosis, or  edema. Respiratory: Normal respiratory effort, no increased work of breathing. Neurologic: Grossly intact, no focal deficits, moving all 4 extremities. Psychiatric: Normal mood and affect.    Laboratory Data: Lab Results  Component Value Date   WBC 8.1 03/01/2024   HGB 14.3 03/01/2024   HCT 41.5 03/01/2024   MCV 89.8 03/01/2024   PLT 240 03/01/2024    Lab Results  Component Value Date   CREATININE 0.82 04/02/2024   Lab Results  Component Value Date   HGBA1C 5.2 03/02/2024    Lab Results  Component Value Date   TSH 0.68 01/02/2024       Component Value Date/Time   CHOL 126  03/02/2024 0623   HDL 36 (L) 03/02/2024 0623   CHOLHDL 3.5 03/02/2024 0623   VLDL 22 03/02/2024 0623   LDLCALC 68 03/02/2024 0623    Lab Results  Component Value Date   AST 20 03/01/2024   Lab Results  Component Value Date   ALT 18 03/01/2024   Urinalysis See EPIC and HPI  I have reviewed the labs.   Pertinent Imaging: KUB negative for stones I have independently reviewed the films.  See HPI.  Radiologist interpretation still pending.    Assessment & Plan:   1. Ureteral stone (Primary) - KUB no stones seen, likely passed - will order RUS to ensure right hydronephrosis has resolved - Urinalysis, Complete - CULTURE, URINE COMPREHENSIVE - will also pursue a 24 hour metabolic work up   2. Mixed incontinence - UA unremarkable - urine culture pending - history of urethra diverticulum, will need further evaluation once renal ultrasound is complete  Return in about 1 month (around 06/03/2024) for review RUS report, review litholink report, OAB questionnaire, PVR and exam .  These notes generated with voice recognition software. I apologize for typographical errors.  Briant Camper  [facility] [address]  [address] [place], [place] [address] [phone]

## 2024-05-05 ENCOUNTER — Encounter: Payer: Self-pay | Admitting: Urology

## 2024-05-05 DIAGNOSIS — G459 Transient cerebral ischemic attack, unspecified: Secondary | ICD-10-CM

## 2024-05-05 DIAGNOSIS — R42 Dizziness and giddiness: Secondary | ICD-10-CM | POA: Diagnosis not present

## 2024-05-07 LAB — CULTURE, URINE COMPREHENSIVE

## 2024-05-08 ENCOUNTER — Other Ambulatory Visit: Payer: Self-pay

## 2024-05-08 DIAGNOSIS — Z5941 Food insecurity: Secondary | ICD-10-CM | POA: Diagnosis not present

## 2024-05-08 DIAGNOSIS — Z599 Problem related to housing and economic circumstances, unspecified: Secondary | ICD-10-CM | POA: Diagnosis not present

## 2024-05-08 DIAGNOSIS — Z1331 Encounter for screening for depression: Secondary | ICD-10-CM | POA: Diagnosis not present

## 2024-05-08 DIAGNOSIS — R202 Paresthesia of skin: Secondary | ICD-10-CM | POA: Diagnosis not present

## 2024-05-08 DIAGNOSIS — R519 Headache, unspecified: Secondary | ICD-10-CM | POA: Diagnosis not present

## 2024-05-08 DIAGNOSIS — Z59819 Housing instability, housed unspecified: Secondary | ICD-10-CM | POA: Diagnosis not present

## 2024-05-08 MED ORDER — NORTRIPTYLINE HCL 10 MG PO CAPS
ORAL_CAPSULE | ORAL | 1 refills | Status: DC
  Filled 2024-05-08: qty 53, 30d supply, fill #0
  Filled 2024-07-02: qty 53, 53d supply, fill #1
  Filled 2024-10-15: qty 14, 7d supply, fill #2

## 2024-05-11 ENCOUNTER — Other Ambulatory Visit

## 2024-05-11 ENCOUNTER — Telehealth: Payer: Self-pay

## 2024-05-11 DIAGNOSIS — N132 Hydronephrosis with renal and ureteral calculous obstruction: Secondary | ICD-10-CM

## 2024-05-11 DIAGNOSIS — N201 Calculus of ureter: Secondary | ICD-10-CM | POA: Diagnosis not present

## 2024-05-11 NOTE — Telephone Encounter (Signed)
 LVM for to return call, left detailed message informing the pt that she will need a lab visit to drop off litho link kit. If the 24 hour urine collection has already been collected she will need to have an appoint no later than 3:30 per michelle or this process will have to be repeated.

## 2024-05-11 NOTE — Telephone Encounter (Signed)
 Pt LM on triage line stating that she has followed the instructions inside the litholink kit she received and is questioning which [employer] location she should drop the kit off at.   Called pt back, no answer. Left detailed message advising pt to follow the instructions we gave her in her AVS and not to follow instructions in litholink kit. Advised pt to call back in order to schedule lab work and drop off.

## 2024-05-12 LAB — LITHOLINK SERUM PANEL
CO2: 23 mmol/L (ref 20–29)
Calcium: 10 mg/dL (ref 8.7–10.3)
Chloride: 103 mmol/L (ref 96–106)
Creatinine, Ser: 1.31 mg/dL — ABNORMAL HIGH (ref 0.57–1.00)
Magnesium: 2.3 mg/dL (ref 1.6–2.3)
Phosphorus: 4.3 mg/dL (ref 3.0–4.3)
Potassium: 5.2 mmol/L (ref 3.5–5.2)
Sodium: 141 mmol/L (ref 134–144)
Uric Acid: 4.2 mg/dL (ref 3.1–7.9)
eGFR: 43 mL/min/{1.73_m2} — ABNORMAL LOW (ref >59)

## 2024-05-15 ENCOUNTER — Ambulatory Visit
Admission: RE | Admit: 2024-05-15 | Discharge: 2024-05-15 | Disposition: A | Discharge status: Home / Self Care | Source: Ambulatory Visit | Attending: Urology | Admitting: Urology

## 2024-05-15 DIAGNOSIS — N201 Calculus of ureter: Secondary | ICD-10-CM | POA: Diagnosis not present

## 2024-05-15 DIAGNOSIS — N132 Hydronephrosis with renal and ureteral calculous obstruction: Secondary | ICD-10-CM | POA: Diagnosis not present

## 2024-05-17 ENCOUNTER — Ambulatory Visit: Payer: Self-pay | Admitting: Physician Assistant

## 2024-05-18 NOTE — Telephone Encounter (Addendum)
 Called patient and lvm to call office to schedule appt with Cathleen Coach in 2 weeks for review RUS report, review litholink report, OAB questionnaire, PVR and exam       ----- Message from [facility] M sent at 05/18/2024  8:48 AM EDT -----  ----- Message ----- From: Kathreen Pare, PA-C Sent: 05/17/2024   5:21 PM EDT To: Katina Parlor, CMA  Please schedule with Cathleen Coach in 2 weeks for review RUS report, review litholink report, OAB questionnaire, PVR and exam  ----- Message ----- From: Interface, Rad Results In Sent: 05/16/2024   8:24 AM EDT To: Oda Bence, PA-C

## 2024-05-19 LAB — LITHOLINK 24HR URINE PANEL
Ammonium, Urine: 30 mmol/(24.h) (ref 15–60)
Calcium Oxalate Saturation: 7.41 (ref 6.00–10.00)
Calcium Phosphate Saturation: 0.49 — ABNORMAL LOW (ref 0.50–2.00)
Calcium, Urine: 147 mg/(24.h) (ref <200)
Calcium/Creatinine Ratio: 127 mg/g{creat} (ref 51–262)
Calcium/Kg Body Weight: 1.9 mg/kg/d (ref <4.0)
Chloride, Urine: 113 mmol/(24.h) (ref 70–250)
Citrate, Urine: 216 mg/(24.h) — ABNORMAL LOW (ref >550)
Creatinine, Urine: 1155 mg/(24.h)
Creatinine/Kg Body Weight: 14.6 mg/kg/d (ref 8.7–20.3)
Cystine, Urine, Qualitative: NEGATIVE
Magnesium, Urine: 74 mg/(24.h) (ref 30–120)
Oxalate, Urine: 29 mg/(24.h) (ref 20–40)
Phosphorus, Urine: 1001 mg/(24.h) (ref 600–1200)
Potassium, Urine: 80 mmol/(24.h) (ref 20–100)
Protein Catabolic Rate: 1.2 g/kg/d (ref 0.8–1.4)
Sodium, Urine: 100 mmol/(24.h) (ref 50–150)
Sulfate, Urine: 54 meq/(24.h) (ref 20–80)
Urea Nitrogen, Urine: 13.2 g/(24.h) (ref 6.00–14.00)
Uric Acid Saturation: 3.3 — ABNORMAL HIGH (ref <1.00)
Uric Acid, Urine: 767 mg/(24.h) — ABNORMAL HIGH (ref <750)
Urine Volume (Preserved): 1140 mL/(24.h) (ref 500–4000)
pH, 24 hr, Urine: 5.436 — ABNORMAL LOW (ref 5.800–6.200)

## 2024-05-25 ENCOUNTER — Other Ambulatory Visit: Payer: Self-pay | Admitting: Cardiovascular Disease

## 2024-05-25 ENCOUNTER — Other Ambulatory Visit: Payer: Self-pay

## 2024-05-28 ENCOUNTER — Other Ambulatory Visit: Payer: Self-pay

## 2024-05-28 ENCOUNTER — Encounter: Payer: Self-pay | Admitting: Cardiovascular Disease

## 2024-05-28 NOTE — Telephone Encounter (Signed)
 Error

## 2024-05-28 NOTE — Telephone Encounter (Signed)
Please contact pt for future appointment. Pt overdue for 6 month f/u. Pt needing refills. 

## 2024-05-28 NOTE — Telephone Encounter (Signed)
 LVM to schedule fu appt/refills

## 2024-05-31 ENCOUNTER — Ambulatory Visit: Admitting: Urology

## 2024-05-31 ENCOUNTER — Other Ambulatory Visit: Payer: Self-pay

## 2024-05-31 VITALS — BP 128/75 | HR 106 | Wt 172.5 lb

## 2024-05-31 DIAGNOSIS — N952 Postmenopausal atrophic vaginitis: Secondary | ICD-10-CM | POA: Diagnosis not present

## 2024-05-31 DIAGNOSIS — N3946 Mixed incontinence: Secondary | ICD-10-CM | POA: Diagnosis not present

## 2024-05-31 DIAGNOSIS — N201 Calculus of ureter: Secondary | ICD-10-CM

## 2024-05-31 DIAGNOSIS — N200 Calculus of kidney: Secondary | ICD-10-CM | POA: Diagnosis not present

## 2024-05-31 LAB — BLADDER SCAN AMB NON-IMAGING: Scan Result: 23

## 2024-05-31 MED ORDER — ESTRADIOL 0.1 MG/GM VA CREA
TOPICAL_CREAM | VAGINAL | 12 refills | Status: DC
  Filled 2024-05-31 – 2024-08-31 (×2): qty 42.5, 30d supply, fill #0

## 2024-05-31 NOTE — Progress Notes (Signed)
 Julia Cooke

## 2024-06-05 ENCOUNTER — Encounter: Payer: Self-pay | Admitting: Urology

## 2024-06-05 NOTE — Progress Notes (Signed)
 05/31/2024 12:11 PM   Julia Cooke 02/22/1951 [id number]  Referring provider: Marylynn Verneita CROME, MD [address] [place],  [place] [address]  Urological history: 1. 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 Nephrolithiasis - stone composition 55% calcium  oxalate monohydrate and 45% calcium  phosphate carbonate - ESWL (2018) - spontaneous passage of stones - CT renal stone (02/2024) - 4 mm right UVJ stone and bilateral nephrolithiasis  3. High risk hematuria - non-smoker - CTU (2020)  Chief Complaint  Patient presents with   1 month f/u    HPI: Julia Cooke is a 73 y.o. woman who presents today for RUS report and Litholink results  Previous records reviewed.   In April, she was admitted for a TIA in April with an incidental finding of a 4 mm right ureteral stone on CT.   CBC was normal,   Serum creatinine of 0.79.   UA w/ micro heme.  Urine culture <10,000 colonies.     At her visit with me on 05/04/2024, she was not having any further renal colic.  She did not recall passing a stone, but she was not given a strainer.  Patient denied any modifying or aggravating factors.  Patient denied any recent UTI's, gross hematuria, dysuria or suprapubic/flank pain.  Patient denied any fevers, chills, nausea or vomiting.   She was having issues with incontinence and frequency, but these are not new symptoms.  UA yellow clear, specific gravity 1.020, pH 6.0, trace leukocytes, 0-5 WBC's, 0-2 RBC's, > 10 epithelial cells, mucus present and few bacteria.  Urine culture grew out mixed urogenital flora.  KUB negative for stones.  We ordered a renal ultrasound to ensure the right hydronephrosis had resolved and we also ordered a 24-hour metabolic workup.  RUS (04/2024) no hydro  She is having 1-7 daytime voids, 1-2 episodes of nocturia with a mild urge to urinate.  She has stress incontinence.  She leaks 1-2 times a week.   She wears panty liners daily.  She does not limit fluid intake.  She does engage in toilet mapping.  Patient denies any modifying or aggravating factors.  Patient denies any recent UTI's, gross hematuria, dysuria or suprapubic/flank pain.  Patient denies any fevers, chills, nausea or vomiting.    Litho link completed (05/19/2024) noted low urine volume marked hypocitraturia, low urine pH, mild hyperuricosuria, moderate calcium  oxalate stone risk, and extreme uric acid supersaturation  PMH: Past Medical History:  Diagnosis Date   Bipolar disorder (HCC)    takes Abilify    Depression    Dysrhythmia 10/2016   LBBB on ekg...nothing to compare it to.   Heart murmur    Hypertension    Nephrolithiasis 2000   s/p lithotripsy   Psychotic disorder (HCC)    mania   Wears contact lenses     Surgical History: Past Surgical History:  Procedure Laterality Date   ARTHRODESIS METATARSALPHALANGEAL JOINT (MTPJ) Left 03/09/2023   Procedure: ARTHRODESIS METATARSALPHALANGEAL JOINT (MTPJ);  Surgeon: Ashley Soulier, DPM;  Location: [facility];  Service: Podiatry;  Laterality: Left;   CAPSULOTOMY METATARSOPHALANGEAL Left 03/09/2023   Procedure: CAPSULOTOMY METATARSOPHALANGEAL SECOND;  Surgeon: Ashley Soulier, DPM;  Location: [facility];  Service: Podiatry;  Laterality: Left;   EXTRACORPOREAL SHOCK WAVE LITHOTRIPSY Left 01/06/2017   Procedure: EXTRACORPOREAL SHOCK WAVE LITHOTRIPSY (ESWL);  Surgeon: Rosina Riis, MD;  Location: [facility];  Service: Urology;  Laterality: Left;  FLEXOR TENOTOMY  Left 03/09/2023   Procedure: FLEXOR TENOTOMY;  Surgeon: Ashley Soulier, DPM;  Location: [facility];  Service: Podiatry;  Laterality: Left;   LITHOTRIPSY  2000    Home Medications:  Allergies as of 05/31/2024       Reactions   Penicillins Shortness Of Breath   As a child        Medication List        Accurate as of May 31, 2024 11:59 PM. If you have any questions, ask your nurse or  doctor.          amLODipine  2.5 MG tablet Commonly known as: NORVASC  Take 1 tablet (2.5 mg total) by mouth at bedtime.   aspirin  EC 81 MG tablet Take 1 tablet (81 mg total) by mouth daily. Swallow whole.   atorvastatin  10 MG tablet Commonly known as: LIPITOR Take 1 tablet (10 mg total) by mouth daily. Please schedule appointment for future refills   dicyclomine  10 MG capsule Commonly known as: BENTYL  Take 1 capsule (10 mg total) by mouth 4 (four) times daily -  before meals and at bedtime.   DULoxetine  30 MG capsule Commonly known as: Cymbalta  Take 1 capsule (30 mg total) by mouth daily.   estradiol  0.1 MG/GM vaginal cream Commonly known as: ESTRACE  VAGINAL Apply 0.5mg  (pea-sized amount)  just inside the vaginal introitus with a finger-tip on Monday, Wednesday and Friday nights. Started by: Albana Saperstein   hydrocortisone  cream 0.5 % Apply 1 Application topically 2 (two) times daily.   irbesartan  300 MG tablet Commonly known as: Avapro  Take 1 tablet (300 mg total) by mouth daily. Patient must schedule annual appointment for further refills second attempt   ketoconazole  2 % cream Commonly known as: NIZORAL  Apply 1 Application topically 2 (two) times daily.   lamoTRIgine  25 MG tablet Commonly known as: LAMICTAL  Take 1 tablet (25 mg total) by mouth 2 (two) times daily.   metoprolol  succinate 50 MG 24 hr tablet Commonly known as: TOPROL -XL Take 1 tablet (50 mg total) by mouth daily. Patient must schedule annual appointment for further refills second attempt   multivitamin tablet Take 1 tablet by mouth daily.   nortriptyline  10 MG capsule Commonly known as: PAMELOR  Take 1 capsule (10 mg total) by mouth Nightly for 7 days, THEN 2 capsules (20 mg total) Nightly for 14 days. Start taking on: May 08, 2024   nystatin  powder Generic drug: nystatin  Apply 1 Application topically 2 (two) times daily. To rash until resolved.   Spacer/Aero-Holding [employer] 1 Units  by Does not apply route daily.   VITAMIN B-12 PO Take by mouth daily.   zolpidem  5 MG tablet Commonly known as: AMBIEN  Take 0.5-1 tablets (2.5-5 mg total) by mouth at bedtime as needed for sleep.        Allergies:  Allergies  Allergen Reactions   Penicillins Shortness Of Breath    As a child    Family History: Family History  Problem Relation Age of Onset   Hyperlipidemia Mother    Stroke Mother    Hypertension Mother    Hyperlipidemia Father    Hypertension Father    Cancer Sister        breast   Breast cancer Sister 30   Fibromyalgia Sister    Hypertension Sister    Heart Problems Sister    Hypertension Sister    Heart disease Brother        myocardial infarction   Heart attack Brother  Hypertension Brother    Prostate cancer Neg Hx    Kidney cancer Neg Hx    Bladder Cancer Neg Hx     Social History:  reports that she has never smoked. She has never used smokeless tobacco. She reports current alcohol use. She reports that she does not use drugs.  ROS: Pertinent ROS in HPI  Physical Exam: BP 128/75   Pulse (!) 106   Wt 172 lb 8 oz (78.2 kg)   BMI 33.69 kg/m   Constitutional:  Well nourished. Alert and oriented, No acute distress. HEENT: [place] AT, moist mucus membranes.  Trachea midline Cardiovascular: No clubbing, cyanosis, or edema. Respiratory: Normal respiratory effort, no increased work of breathing. GU: No CVA tenderness.  No bladder fullness or masses.  Recession of labia minora, dry, pale vulvar vaginal mucosa and loss of mucosal ridges and folds.  Normal urethral meatus, no lesions, no prolapse, no discharge.   No urethral masses, tenderness and/or tenderness.  Urethral caruncle noted.  No bladder fullness, tenderness or masses. Pale vagina mucosa, fair estrogen effect, no discharge, no lesions, fair pelvic support, no cystocele and grad II rectocele noted.  No urethral diverticulum is palpated.  Anus and perineum are without rashes or lesions.     Neurologic: Grossly intact, no focal deficits, moving all 4 extremities. Psychiatric: Normal mood and affect.    Laboratory Data: See HPI and EPIC  I have reviewed the labs.   Pertinent Imaging: CLINICAL DATA:  Nephrolithiasis and right hydronephrosis.   EXAM: RENAL / URINARY TRACT ULTRASOUND COMPLETE   COMPARISON:  March 01, 2024 CT renal protocol.   FINDINGS: Right Kidney:   Renal measurements: 10.2 x 4.1 x 4.6 cm = volume: 100 mL. Echogenicity within normal limits. No mass or hydronephrosis visualized.   Left Kidney:   Renal measurements: 9.3 x 4.5 x 4.7 cm = volume: 104 mL. Nonobstructing stones are identified in the midpole left kidney largest measures 7 mm. Echogenicity within normal limits. No mass or hydronephrosis visualized.   Bladder:   Appears normal for degree of bladder distention.   Other:   None.   IMPRESSION: 1. No acute abnormality identified. No hydronephrosis bilaterally. 2. Nonobstructing stones in the midpole left kidney largest measures 7 mm.     Electronically Signed   By: Craig Farr M.D.   On: 05/16/2024 08:22 I have independently reviewed the films.  See HPI.    Assessment & Plan:   1. Ureteral stone (Primary) - KUB no stones seen, likely passed - RUS ensures right hydronephrosis has resolved  2.  Nephrolithiasis -Encouraged increased fluid intake so that she voids at least 2.5 L daily - She would like to approach stone prevention with diet, so I gave her a low oxalate diet list from [facility] and also advised her to decrease her protein intake to 0.8 g/kg/day - need to repeat the study ~ 6 weeks after dietary changes are made   3. Mixed incontinence - She did not have a significant cystocele on exam - I will have her start vaginal estrogen cream 3 nights weekly explaining that this may be a symptom of GSM  4. GSM - Start vaginal estrogen cream 3 nights weekly - Describe the pathophysiology of vaginal atrophy  and how it contributes to her urinary symptoms   Return in about 3 months (around 08/31/2024) for exam .  These notes generated with voice recognition software. I apologize for typographical errors.  CLOTILDA HELON RIGGERS  [facility] [address]  [address] [place], [place] [address] [phone]

## 2024-06-11 ENCOUNTER — Other Ambulatory Visit: Payer: Self-pay

## 2024-06-11 ENCOUNTER — Ambulatory Visit: Attending: Physician Assistant | Admitting: Physician Assistant

## 2024-06-11 ENCOUNTER — Encounter: Payer: Self-pay | Admitting: Physician Assistant

## 2024-06-11 VITALS — BP 130/59 | HR 94 | Ht 60.0 in | Wt 167.8 lb

## 2024-06-11 DIAGNOSIS — I10 Essential (primary) hypertension: Secondary | ICD-10-CM | POA: Diagnosis not present

## 2024-06-11 DIAGNOSIS — E785 Hyperlipidemia, unspecified: Secondary | ICD-10-CM | POA: Diagnosis not present

## 2024-06-11 DIAGNOSIS — R0609 Other forms of dyspnea: Secondary | ICD-10-CM | POA: Diagnosis not present

## 2024-06-11 DIAGNOSIS — R202 Paresthesia of skin: Secondary | ICD-10-CM | POA: Diagnosis not present

## 2024-06-11 DIAGNOSIS — I340 Nonrheumatic mitral (valve) insufficiency: Secondary | ICD-10-CM | POA: Diagnosis not present

## 2024-06-11 DIAGNOSIS — R200 Anesthesia of skin: Secondary | ICD-10-CM

## 2024-06-11 MED ORDER — IRBESARTAN 300 MG PO TABS
300.0000 mg | ORAL_TABLET | Freq: Every day | ORAL | 3 refills | Status: AC
Start: 2024-06-11 — End: ?
  Filled 2024-06-11 – 2024-06-22 (×2): qty 90, 90d supply, fill #0
  Filled 2024-10-15: qty 90, 90d supply, fill #1

## 2024-06-11 MED ORDER — ATORVASTATIN CALCIUM 10 MG PO TABS
10.0000 mg | ORAL_TABLET | Freq: Every day | ORAL | 3 refills | Status: AC
  Filled 2024-06-11 – 2024-06-22 (×2): qty 90, 90d supply, fill #0

## 2024-06-11 MED ORDER — METOPROLOL SUCCINATE ER 50 MG PO TB24
50.0000 mg | ORAL_TABLET | Freq: Every day | ORAL | 3 refills | Status: AC
  Filled 2024-06-11 – 2024-06-22 (×2): qty 90, 90d supply, fill #0
  Filled 2024-10-15: qty 90, 90d supply, fill #1

## 2024-06-11 NOTE — Patient Instructions (Signed)
 Medication Instructions:  Your physician recommends that you continue on your current medications as directed. Please refer to the Current Medication list given to you today.   *If you need a refill on your cardiac medications before your next appointment, please call your pharmacy*  Lab Work: None ordered at this time  If you have labs (blood work) drawn today and your tests are completely normal, you will receive your results only by: MyChart Message (if you have MyChart) OR A paper copy in the mail If you have any lab test that is abnormal or we need to change your treatment, we will call you to review the results.  Testing/Procedures: Your provider has ordered a Exercise Myoview  Stress test. This will take place at [facility]. Please report to the [facility] medical mall entrance. The volunteers at the first desk will direct you where to go.  [facility] MYOVIEW   Your provider has ordered a Stress Test with nuclear imaging. The purpose of this test is to evaluate the blood supply to your heart muscle. This procedure is referred to as a Non-Invasive Stress Test. This is because other than having an IV started in your vein, nothing is inserted or invades your body. Cardiac stress tests are done to find areas of poor blood flow to the heart by determining the extent of coronary artery disease (CAD). Some patients exercise on a treadmill, which naturally increases the blood flow to your heart, while others who are unable to walk on a treadmill due to physical limitations will have a pharmacologic/chemical stress agent called Lexiscan  . This medicine will mimic walking on a treadmill by temporarily increasing your coronary blood flow.   Please note: these test may take anywhere between 2-4 hours to complete  How to prepare for your Myoview  test:  Nothing to eat for 6 hours prior to the test No caffeine for 24 hours prior to test No smoking 24 hours prior to test. Your medication may be taken with  water. Hold BETA BLOCKER 24 hours prior to test Ladies, please do not wear dresses.  Skirts or pants are appropriate. Please wear a short sleeve shirt. No perfume, cologne or lotion. Wear comfortable walking shoes. No heels!   PLEASE NOTIFY THE OFFICE AT LEAST 24 HOURS IN ADVANCE IF YOU ARE UNABLE TO KEEP YOUR APPOINTMENT.  [phone] AND  PLEASE NOTIFY NUCLEAR MEDICINE AT [facility] AT LEAST 24 HOURS IN ADVANCE IF YOU ARE UNABLE TO KEEP YOUR APPOINTMENT. [phone]   Follow-Up: At [facility], you and your health needs are our priority.  As part of our continuing mission to provide you with exceptional heart care, our providers are all part of one team.  This team includes your primary Cardiologist (physician) and Advanced Practice Providers or APPs (Physician Assistants and Nurse Practitioners) who all work together to provide you with the care you need, when you need it.  Your next appointment:   6 month(s)  Provider:   You may see Deatrice Cage, MD or Bernardino Bring, PA-C  We recommend signing up for the patient portal called MyChart.  Sign up information is provided on this After Visit Summary.  MyChart is used to connect with patients for Virtual Visits (Telemedicine).  Patients are able to view lab/test results, encounter notes, upcoming appointments, etc.  Non-urgent messages can be sent to your provider as well.   To learn more about what you can do with MyChart, go to [email or web address].

## 2024-06-11 NOTE — Progress Notes (Signed)
 Cardiology Office Note    Date:  06/11/2024   ID:  Julia Cooke, DOB 12-04-1950, MRN [id number]  PCP:  Marylynn Verneita CROME, MD  Cardiologist:  Deatrice Cage, MD  Electrophysiologist:  None   Chief Complaint: Follow up  History of Present Illness:   Julia Cooke is a 73 y.o. female with history of normal coronary arteries by coronary CTA in 12/2022, HTN, HLD, bipolar disorder, and left bundle branch block who presents for follow up of HTN.   She was previously evaluated by Dr. Monette with echo in 11/2016 showing an EF of 55 to 60%, normal wall motion, grade 1 diastolic dysfunction, and mild to moderate pulmonic regurgitation.  Lexiscan  MPI in 11/2016 showed no evidence of significant ischemia and was overall low risk.  She is a non-smoker and does not have history of diabetes.  Her family history is remarkable for hypertension and CVA.   She established with Dr. Cage in 10/2022 for evaluation of worsening exertional dyspnea with associated chest discomfort described as burning with exertion.  It was also noted her blood pressure had been uncontrolled despite losartan  and metoprolol .  Given symptoms, she underwent coronary CTA on 01/03/2023 that showed a calcium  score of 0 with no evidence of CAD.  There were no significant extracardiac abnormalities noted.  Echo on 01/04/2023 showed an EF of 55 to 60%, no regional wall motion abnormalities, moderate concentric LVH of the inferior and inferolateral segments, grade 1 diastolic dysfunction, normal RV systolic function and ventricular cavity size, mildly elevated PASP estimated at 37.3 mmHg, mild to moderate mitral regurgitation, mild aortic insufficiency, and an estimated right atrial pressure of 3 mmHg.  She followed up with Dr. Cage on 01/07/2023 and noted improvement in chest pain with stable shortness of breath.  There was concern that some of her symptoms were related to hypertensive heart disease with diastolic dysfunction and mild pulmonary  hypertension.  Given persistently elevated BP, Toprol  was transitioned to carvedilol  6.25 mg twice daily with continuation of losartan  100 mg.  With this, she contacted our office through MyChart noting continued elevations in her BP in the 150s over 90s as well as polyarthralgias and some chest heaviness.  She was concerned she was having off target effects of with carvedilol .  She was seen in the office in 12/2022 noting elevated BP readings up into the 180s systolic.  She was without symptoms of angina or cardiac decompensation.  She felt like her baseline knee arthralgias and myalgias were exacerbated by the addition of carvedilol .  She noted an increase in headaches.  She was transitioned from carvedilol  back to Toprol -XL at 50 mg daily with transition from losartan  to irbesartan  300 mg daily.  She was last seen in the office in 02/2019 for noting well-controlled blood pressures and was without symptoms of angina or cardiac decompensation.  She noted some bilateral lower extremity fatigue and cramping with subsequent ABIs normal bilaterally.  In 02/2024 she presented to the hospital for evaluation of difficulty with word finding, impaired coordination and unsteady gait that persisted for approximately 1 hour with spontaneous resolution.  CT of the head without no acute intracranial abnormality.  MRI of the brain showed no acute intracranial abnormality.  Nonspecific changes were noted possibly related to migraine disorder or early chronic small vessel ischemia.  CT renal stone protocol demonstrated an obstructing right-sided renal stone.  Symptoms were concerning for TIA.  Echo in 03/2024 showed an EF of 55 to 60%, no regional wall motion  abnormalities, grade 1 diastolic dysfunction, normal RV systolic function and ventricular cavity size, no significant valvular abnormality, and an estimated right atrial pressure of 3 mmHg.  Zio patch in 03/2024 showed a predominant rhythm of sinus with 1 episode of NSVT lasting  9 beats and 3 episodes of SVT lasting up to 4 beats.  No sustained arrhythmias.  She has subsequently been evaluated by neurology with recommendation to continue aspirin  81 mg and start nortriptyline .  She comes in doing well from a cardiac perspective and is without symptoms of angina or cardiac decompensation.  No further neurologic symptoms/deficits.  No palpitations, dizziness, presyncope, or syncope.  She does note some exertional shortness of breath when ambulating from an incline or upstairs.  No significant lower extremity swelling or progressive orthopnea.  No falls or symptoms concerning for bleeding.  Blood pressures at home have been well-controlled typically in the 120s systolic.  Currently taking amlodipine  2.5 mg nightly along with irbesartan  300 mg daily and Toprol -XL 50 mg daily.   Labs independently reviewed: 04/2024 - serum creatinine 1.31, potassium 5.2 03/2024 - BUN 24, serum creatinine 0.82 02/2024 - A1c 5.2, TC 126, TG 110, HDL 36, LDL 68 12/2023 - TSH normal  Past Medical History:  Diagnosis Date   Bipolar disorder (HCC)    takes Abilify    Depression    Dysrhythmia 10/2016   LBBB on ekg...nothing to compare it to.   Heart murmur    Hypertension    Nephrolithiasis 2000   s/p lithotripsy   Psychotic disorder (HCC)    mania   Wears contact lenses     Past Surgical History:  Procedure Laterality Date   ARTHRODESIS METATARSALPHALANGEAL JOINT (MTPJ) Left 03/09/2023   Procedure: ARTHRODESIS METATARSALPHALANGEAL JOINT (MTPJ);  Surgeon: Ashley Soulier, DPM;  Location: [facility];  Service: Podiatry;  Laterality: Left;   CAPSULOTOMY METATARSOPHALANGEAL Left 03/09/2023   Procedure: CAPSULOTOMY METATARSOPHALANGEAL SECOND;  Surgeon: Ashley Soulier, DPM;  Location: [facility];  Service: Podiatry;  Laterality: Left;   EXTRACORPOREAL SHOCK WAVE LITHOTRIPSY Left 01/06/2017   Procedure: EXTRACORPOREAL SHOCK WAVE LITHOTRIPSY (ESWL);  Surgeon: Rosina Riis, MD;   Location: [facility];  Service: Urology;  Laterality: Left;   FLEXOR TENOTOMY  Left 03/09/2023   Procedure: FLEXOR TENOTOMY;  Surgeon: Ashley Soulier, DPM;  Location: [facility];  Service: Podiatry;  Laterality: Left;   LITHOTRIPSY  2000    Current Medications: Current Meds  Medication Sig   amLODipine  (NORVASC ) 2.5 MG tablet Take 1 tablet (2.5 mg total) by mouth at bedtime.   aspirin  EC 81 MG tablet Take 1 tablet (81 mg total) by mouth daily. Swallow whole.   Cyanocobalamin  (VITAMIN B-12 PO) Take by mouth daily.   dicyclomine  (BENTYL ) 10 MG capsule Take 1 capsule (10 mg total) by mouth 4 (four) times daily -  before meals and at bedtime.   DULoxetine  (CYMBALTA ) 30 MG capsule Take 1 capsule (30 mg total) by mouth daily.   estradiol  (ESTRACE  VAGINAL) 0.1 MG/GM vaginal cream Apply 0.5mg  (pea-sized amount)  just inside the vaginal introitus with a finger-tip on Monday, Wednesday and Friday nights.   hydrocortisone  cream 0.5 % Apply 1 Application topically 2 (two) times daily.   ketoconazole  (NIZORAL ) 2 % cream Apply 1 Application topically 2 (two) times daily.   lamoTRIgine  (LAMICTAL ) 25 MG tablet Take 1 tablet (25 mg total) by mouth 2 (two) times daily.   Multiple Vitamin (MULTIVITAMIN) tablet Take 1 tablet by mouth daily.   nortriptyline  (PAMELOR ) 10 MG capsule  Take 1 capsule (10 mg total) by mouth Nightly for 7 days, THEN 2 capsules (20 mg total) Nightly for 14 days.   nystatin  (MYCOSTATIN /NYSTOP ) powder Apply 1 Application topically 2 (two) times daily. To rash until resolved.   Spacer/Aero-Holding Chambers DEVI 1 Units by Does not apply route daily.   zolpidem  (AMBIEN ) 5 MG tablet Take 0.5-1 tablets (2.5-5 mg total) by mouth at bedtime as needed for sleep.   [DISCONTINUED] atorvastatin  (LIPITOR) 10 MG tablet Take 1 tablet (10 mg total) by mouth daily. Please schedule appointment for future refills   [DISCONTINUED] irbesartan  (AVAPRO ) 300 MG tablet Take 1 tablet (300 mg total) by mouth  daily. Patient must schedule annual appointment for further refills second attempt   [DISCONTINUED] metoprolol  succinate (TOPROL -XL) 50 MG 24 hr tablet Take 1 tablet (50 mg total) by mouth daily. Patient must schedule annual appointment for further refills second attempt    Allergies:   Penicillins   Social History   Socioeconomic History   Marital status: Married    Spouse name: richard   Number of children: 3   Years of education: Not on file   Highest education level: Associate degree: academic program  Occupational History    Comment: retired  Tobacco Use   Smoking status: Never   Smokeless tobacco: Never  Vaping Use   Vaping status: Never Used  Substance and Sexual Activity   Alcohol use: Yes    Alcohol/week: 0.0 - 2.0 standard drinks of alcohol    Comment: 2-3 times weekly   Drug use: No   Sexual activity: Yes    Birth control/protection: Post-menopausal, None  Other Topics Concern   Not on file  Social History Narrative   Patient lives at home with her husband of 41 years. They have 3 adult children (2 boys, 1 girl). They recently moved back to [place] in January. 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 Patient has also worked as a Occupational hygienist.   Social Drivers of Health   Financial Resource Strain: Medium Risk (05/11/2024)   Received from [facility]   Overall Financial Resource Strain (CARDIA)    Difficulty of Paying Living Expenses: Somewhat hard  Food Insecurity: Food Insecurity Present (05/11/2024)   Received from [facility]   Hunger Vital Sign    Within the past 12 months, you worried that your food would run out before you got the money to buy more.: Sometimes true    Within the past 12 months, the food you bought just didn't last and you didn't have money to get more.: Sometimes true  Transportation Needs: No Transportation Needs (05/11/2024)   Received from [facility] - Transportation    In the past 12 months, has lack of transportation kept you from medical appointments or from getting medications?: No    Lack of Transportation (Non-Medical): No  Physical Activity: Insufficiently Active (05/11/2024)   Received from [facility]   Exercise Vital Sign    On average, how many days per week do you engage in moderate to strenuous exercise (like a brisk walk)?: 7 days    On average, how many minutes do you engage in exercise at this level?: 20 min  Stress: Stress Concern Present (04/29/2024)   [employer] [facility] [employer] [facility] - Occupational Stress Questionnaire    Feeling of Stress : To some extent  Social Connections: Socially Integrated (05/11/2024)   Received  from [facility]   Social Connection and Isolation Panel    In a typical week, how many times do you talk on the phone with family, friends, or neighbors?: More than three times a week    How often do you get together with friends or relatives?: Three times a week    How often do you attend church or religious services?: More than 4 times per year    Do you belong to any clubs or organizations such as church groups, unions, fraternal or athletic groups, or school groups?: Yes    How often do you attend meetings of the clubs or organizations you belong to?: More than 4 times per year    Are you married, widowed, divorced, separated, never married, or living with a partner?: Married     Family History:  The patient's family history includes Breast cancer (age of onset: 27) in her sister; Cancer in her sister; Fibromyalgia in her sister; Heart Problems in her sister; Heart attack in her brother; Heart disease in her brother; Hyperlipidemia in her father and mother; Hypertension in her brother, father, mother, sister, and sister; Stroke in her mother. There is no history of Prostate cancer, Kidney cancer, or Bladder Cancer.  ROS:   12-point review of systems  is negative unless otherwise noted in the HPI.   EKGs/Labs/Other Studies Reviewed:    Studies reviewed were summarized above. The additional studies were reviewed today:  Zio patch 03/2024: HR 54 to 250, average 89. 1 nonsustained VT lasting 9 beats. 3 nonsustained SVT, longest 4 beats Rare supraventricular and ventricular ectopy. No sustained arrhythmias. No atrial fibrillation. __________  2D echo 04/06/2024: 1. Left ventricular ejection fraction, by estimation, is 55 to 60%. Left  ventricular ejection fraction by 2D MOD biplane is 58.3 %. The left  ventricle has normal function. The left ventricle has no regional wall  motion abnormalities. Left ventricular  diastolic parameters are consistent with Grade I diastolic dysfunction  (impaired relaxation).   2. Right ventricular systolic function is normal. The right ventricular  size is normal.   3. The mitral valve is normal in structure. No evidence of mitral valve  regurgitation.   4. The aortic valve was not well visualized. Aortic valve regurgitation  is not visualized.   5. The inferior vena cava is normal in size with greater than 50%  respiratory variability, suggesting right atrial pressure of 3 mmHg.  __________  2D echo 01/04/2023: 1. Left ventricular ejection fraction, by estimation, is 55 to 60%. The  left ventricle has normal function. The left ventricle has no regional  wall motion abnormalities. There is moderate eccentric left ventricular  hypertrophy of the inferior and  infero-lateral segments. Left ventricular diastolic parameters are  consistent with Grade I diastolic dysfunction (impaired relaxation). The  average left ventricular global longitudinal strain is -19.7 %.   2. Right ventricular systolic function is normal. The right ventricular  size is normal. There is mildly elevated pulmonary artery systolic  pressure. The estimated right ventricular systolic pressure is 37.3 mmHg.   3. The mitral valve is  normal in structure. Mild to moderate mitral valve  regurgitation. No evidence of mitral stenosis.   4. The aortic valve is normal in structure. Aortic valve regurgitation is  mild. No aortic stenosis is present.   5. The inferior vena cava is normal in size with greater than 50%  respiratory variability, suggesting right atrial pressure of 3 mmHg.  __________   Coronary CTA  01/03/2023: FINDINGS: Aorta: Normal size. Mild aortic root calcifications. No dissection.   Aortic Valve:  Trileaflet.  No calcifications.   Coronary Arteries:  Normal coronary origin.  Right dominance.   RCA is a dominant artery that gives rise to PDA and PLA. There is no plaque.   Left main gives rise to LAD and LCX arteries.  LM has no disease.   LAD has no plaque.   LCX is a non-dominant artery.  There is no plaque.   Other findings:   Normal pulmonary vein drainage into the left atrium.   Normal left atrial appendage without a thrombus.   Normal size of the pulmonary artery.   IMPRESSION: 1. Normal coronary calcium  score of 0.  Patient is low risk. 2. Normal coronary origin with right dominance. 3. No evidence of CAD. 4. CAD-RADS 0. Consider non-atherosclerotic causes of chest pain. __________   Lexiscan  MPI 12/29/2016: Pharmacological myocardial perfusion imaging study with no significant  ischemia Septal wall hypokinesis,(possibly secondary to conduction abnormality) , EF estimated at 49%  No EKG changes concerning for ischemia at peak stress or in recovery. Resting EKG with intraventricular conduction delay Low risk scan __________   2D echo 12/08/2016: - Left ventricle: The cavity size was normal. Wall thickness was at    the upper limits of normal. Systolic function was normal. The    estimated ejection fraction was in the range of 55% to 60%. Wall    motion was normal; there were no regional wall motion    abnormalities. Doppler parameters are consistent with abnormal    left  ventricular relaxation (grade 1 diastolic dysfunction).    Doppler parameters are consistent with high ventricular filling    pressure.  - Right ventricle: The cavity size was normal. Systolic function    was normal.  - Pulmonic valve: There was mild to moderate regurgitation.   Impressions:   - 1. Normal LV size and contraction. Grade 1 diastolic dysfunction.    2. Normal RV size and function.    3. Mild to moderate pulmonic regurgitation. Otherwise, no    significant valvular abnormalities.   EKG:  EKG is ordered today.  The EKG ordered today demonstrates NSR, 94 bpm, nonspecific IVCD  Recent Labs: 01/02/2024: TSH 0.68 03/01/2024: ALT 18; Hemoglobin 14.3; Platelets 240 04/02/2024: BUN 24 05/11/2024: Creatinine, Ser 1.31; Magnesium 2.3; Potassium 5.2; Sodium 141  Recent Lipid Panel    Component Value Date/Time   CHOL 126 03/02/2024 0623   TRIG 110 03/02/2024 0623   HDL 36 (L) 03/02/2024 0623   CHOLHDL 3.5 03/02/2024 0623   VLDL 22 03/02/2024 0623   LDLCALC 68 03/02/2024 0623   LDLDIRECT 98.0 01/02/2024 1119    PHYSICAL EXAM:    VS:  BP (!) 130/59 (BP Location: Left Arm, Patient Position: Sitting, Cuff Size: Normal)   Pulse 94   Ht 5' (1.524 m)   Wt 167 lb 12.8 oz (76.1 kg)   SpO2 95%   BMI 32.77 kg/m   BMI: Body mass index is 32.77 kg/m.  Physical Exam Vitals reviewed.  Constitutional:      Appearance: She is well-developed.  HENT:     Head: Normocephalic and atraumatic.  Eyes:     General:        Right eye: No discharge.        Left eye: No discharge.  Cardiovascular:     Rate and Rhythm: Normal rate and regular rhythm.     Heart sounds: Normal heart sounds, S1 normal  and S2 normal. Heart sounds not distant. No midsystolic click and no opening snap. No murmur heard.    No friction rub.  Pulmonary:     Effort: Pulmonary effort is normal. No respiratory distress.     Breath sounds: Normal breath sounds. No decreased breath sounds, wheezing, rhonchi or rales.   Chest:     Chest wall: No tenderness.  Musculoskeletal:     Cervical back: Normal range of motion.  Skin:    General: Skin is warm and dry.     Nails: There is no clubbing.  Neurological:     Mental Status: She is alert and oriented to person, place, and time.  Psychiatric:        Speech: Speech normal.        Behavior: Behavior normal.        Thought Content: Thought content normal.        Judgment: Judgment normal.     Wt Readings from Last 3 Encounters:  06/11/24 167 lb 12.8 oz (76.1 kg)  05/31/24 172 lb 8 oz (78.2 kg)  04/30/24 172 lb 12.8 oz (78.4 kg)     ASSESSMENT & PLAN:   HTN: Blood pressure is well controlled on amlodipine  2.5 mg nightly, irbesartan  300 mg daily, and Toprol -XL 50 mg daily.  Exertional dyspnea: Longstanding with reassuring cardiac workup, including echo in 03/2024.  Obtain Lexiscan  MPI to evaluate for high risk ischemia.  If this is reassuring, consider pulmonology evaluation +/- physical deconditioning.  Mild to moderate mitral regurgitation: No evidence of mitral regurgitation on echo in 03/2024.  Continue to monitor.  HLD: LDL 68 in 02/2024.  She remains on atorvastatin  10 mg.  Possible TIA versus complex headache: Echo without significant structural abnormality.  Heart monitor without significant sustained arrhythmia.  Ongoing management per neurology.   Informed Consent   Shared Decision Making/Informed Consent{  The risks [chest pain, shortness of breath, cardiac arrhythmias, dizziness, blood pressure fluctuations, myocardial infarction, stroke/transient ischemic attack, nausea, vomiting, allergic reaction, radiation exposure, metallic taste sensation and life-threatening complications (estimated to be 1 in 10,000)], benefits (risk stratification, diagnosing coronary artery disease, treatment guidance) and alternatives of a nuclear stress test were discussed in detail with Ms. Whinery and she agrees to proceed.        Disposition: F/u with Dr.  Darron or an APP in 6 months, sooner if needed.   Medication Adjustments/Labs and Tests Ordered: Current medicines are reviewed at length with the patient today.  Concerns regarding medicines are outlined above. Medication changes, Labs and Tests ordered today are summarized above and listed in the Patient Instructions accessible in Encounters.   Signed, Bernardino Bring, PA-C 06/11/2024 4:50 PM     [facility] [address] [place], [place] [address] [phone]

## 2024-06-18 ENCOUNTER — Other Ambulatory Visit: Payer: Self-pay

## 2024-06-22 ENCOUNTER — Other Ambulatory Visit: Payer: Self-pay

## 2024-07-02 ENCOUNTER — Ambulatory Visit: Payer: Self-pay | Admitting: Nurse Practitioner

## 2024-07-02 ENCOUNTER — Encounter
Admission: RE | Admit: 2024-07-02 | Discharge: 2024-07-02 | Disposition: A | Discharge status: Home / Self Care | Source: Ambulatory Visit | Attending: Physician Assistant | Admitting: Physician Assistant

## 2024-07-02 ENCOUNTER — Other Ambulatory Visit: Payer: Self-pay | Admitting: Psychiatry

## 2024-07-02 ENCOUNTER — Other Ambulatory Visit: Payer: Self-pay

## 2024-07-02 ENCOUNTER — Other Ambulatory Visit: Payer: Self-pay | Admitting: Physician Assistant

## 2024-07-02 DIAGNOSIS — F3161 Bipolar disorder, current episode mixed, mild: Secondary | ICD-10-CM

## 2024-07-02 DIAGNOSIS — R0609 Other forms of dyspnea: Secondary | ICD-10-CM | POA: Diagnosis not present

## 2024-07-02 LAB — NM MYOCAR MULTI W/SPECT W/WALL MOTION / EF
LV dias vol: 65 mL (ref 46–106)
LV sys vol: 18 mL (ref 3.8–5.2)
MPHR: 148 {beats}/min
Peak HR: 97 {beats}/min
Percent HR: 65 %
Rest HR: 75 {beats}/min
Rest Nuclear Isotope Dose: 10.3 mCi
SDS: 0
SRS: 3
SSS: 0
ST Depression (mm): 0 mm
Stress Nuclear Isotope Dose: 32.9 mCi
TID: 1.2

## 2024-07-02 MED ORDER — REGADENOSON 0.4 MG/5ML IV SOLN
0.4000 mg | Freq: Once | INTRAVENOUS | Status: AC
  Administered 2024-07-02: 0.4 mg via INTRAVENOUS
  Filled 2024-07-02: qty 5

## 2024-07-02 MED ORDER — TECHNETIUM TC 99M TETROFOSMIN IV KIT
10.0000 | PACK | Freq: Once | INTRAVENOUS | Status: AC | PRN
  Administered 2024-07-02: 10.34 via INTRAVENOUS

## 2024-07-02 MED ORDER — TECHNETIUM TC 99M TETROFOSMIN IV KIT
30.0000 | PACK | Freq: Once | INTRAVENOUS | Status: AC | PRN
  Administered 2024-07-02: 32.9 via INTRAVENOUS

## 2024-07-02 NOTE — Progress Notes (Signed)
     [name] presented for a nuclear stress test today.  I Lesley LITTIE Maffucci, PA-C, provided direct supervision and was present during the stress portion of the study today, which was completed without significant symptoms, immediate complications, or acute ST/T changes on ECG.  Stress imaging is pending at this time.  Preliminary ECG findings may be listed in the chart, but the stress test result will not be finalized until perfusion imaging is complete.  Lesley LITTIE Maffucci, PA-C  07/02/2024, 10:34 AM

## 2024-07-16 ENCOUNTER — Other Ambulatory Visit: Payer: Self-pay

## 2024-07-19 DIAGNOSIS — M25511 Pain in right shoulder: Secondary | ICD-10-CM | POA: Diagnosis not present

## 2024-07-20 ENCOUNTER — Other Ambulatory Visit: Payer: Self-pay | Admitting: Orthopedic Surgery

## 2024-07-20 DIAGNOSIS — M25511 Pain in right shoulder: Secondary | ICD-10-CM

## 2024-08-15 ENCOUNTER — Other Ambulatory Visit: Payer: Self-pay | Admitting: Orthopedic Surgery

## 2024-08-15 DIAGNOSIS — M25511 Pain in right shoulder: Secondary | ICD-10-CM

## 2024-08-27 ENCOUNTER — Ambulatory Visit
Admission: RE | Admit: 2024-08-27 | Discharge: 2024-08-27 | Disposition: A | Discharge status: Home / Self Care | Source: Ambulatory Visit | Attending: Orthopedic Surgery | Admitting: Orthopedic Surgery

## 2024-08-27 ENCOUNTER — Other Ambulatory Visit: Payer: Self-pay

## 2024-08-27 DIAGNOSIS — M75121 Complete rotator cuff tear or rupture of right shoulder, not specified as traumatic: Secondary | ICD-10-CM | POA: Diagnosis not present

## 2024-08-27 DIAGNOSIS — M25511 Pain in right shoulder: Secondary | ICD-10-CM | POA: Diagnosis not present

## 2024-08-27 DIAGNOSIS — M19011 Primary osteoarthritis, right shoulder: Secondary | ICD-10-CM | POA: Diagnosis not present

## 2024-08-27 DIAGNOSIS — S46811A Strain of other muscles, fascia and tendons at shoulder and upper arm level, right arm, initial encounter: Secondary | ICD-10-CM | POA: Diagnosis not present

## 2024-08-27 MED FILL — Amlodipine Besylate Tab 2.5 MG (Base Equivalent): ORAL | 90 days supply | Qty: 90 | Fill #1 | Status: AC

## 2024-08-30 DIAGNOSIS — M75121 Complete rotator cuff tear or rupture of right shoulder, not specified as traumatic: Secondary | ICD-10-CM | POA: Diagnosis not present

## 2024-08-30 DIAGNOSIS — M25511 Pain in right shoulder: Secondary | ICD-10-CM | POA: Diagnosis not present

## 2024-08-31 ENCOUNTER — Ambulatory Visit: Admitting: Urology

## 2024-08-31 ENCOUNTER — Other Ambulatory Visit: Payer: Self-pay

## 2024-09-04 NOTE — Progress Notes (Signed)
 09/05/2024 4:45 PM   Julia Cooke 04-07-1951 [id number]  Referring provider: Marylynn Verneita CROME, MD [address] [place],  [place] [address]  Urological history: 1. 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 Nephrolithiasis - stone composition 55% calcium  oxalate monohydrate and 45% calcium  phosphate carbonate - ESWL (2018) - spontaneous passage of stones - CT renal stone (02/2024) - 4 mm right UVJ stone and bilateral nephrolithiasis  3. High risk hematuria - non-smoker - CTU (2020)  Chief Complaint  Patient presents with   Medical Management of Chronic Issues    HPI: Julia Cooke is a 73 y.o. woman who presents today for repeat pelvic exam.   Previous records reviewed.   At her visit on May 31, 2024, she was having symptoms of GSM.  We started her on vaginal estrogen cream 3 nights weekly.  We also discussed dietary changes to reduce her risk factors for nephrolithiasis.   Unfortunately, the vaginal estrogen cream was cost prohibitive and she was not able to start it.  She continues to experience SUI, dyspareunia, and urinary frequency.    Patient denies any modifying or aggravating factors.  Patient denies any recent UTI's, gross hematuria, dysuria or suprapubic/flank pain.  Patient denies any fevers, chills, nausea or vomiting.    PMH: Past Medical History:  Diagnosis Date   Bipolar disorder (HCC)    takes Abilify    Depression    Dysrhythmia 10/2016   LBBB on ekg...nothing to compare it to.   Heart murmur    Hypertension    Nephrolithiasis 2000   s/p lithotripsy   Psychotic disorder (HCC)    mania   Wears contact lenses     Surgical History: Past Surgical History:  Procedure Laterality Date   ARTHRODESIS METATARSALPHALANGEAL JOINT (MTPJ) Left 03/09/2023   Procedure: ARTHRODESIS METATARSALPHALANGEAL JOINT (MTPJ);  Surgeon: Ashley Soulier, DPM;  Location: [facility];   Service: Podiatry;  Laterality: Left;   CAPSULOTOMY METATARSOPHALANGEAL Left 03/09/2023   Procedure: CAPSULOTOMY METATARSOPHALANGEAL SECOND;  Surgeon: Ashley Soulier, DPM;  Location: [facility];  Service: Podiatry;  Laterality: Left;   EXTRACORPOREAL SHOCK WAVE LITHOTRIPSY Left 01/06/2017   Procedure: EXTRACORPOREAL SHOCK WAVE LITHOTRIPSY (ESWL);  Surgeon: Rosina Riis, MD;  Location: [facility];  Service: Urology;  Laterality: Left;   FLEXOR TENOTOMY  Left 03/09/2023   Procedure: FLEXOR TENOTOMY;  Surgeon: Ashley Soulier, DPM;  Location: [facility];  Service: Podiatry;  Laterality: Left;   LITHOTRIPSY  2000    Home Medications:  Allergies as of 09/05/2024       Reactions   Penicillins Shortness Of Breath   As a child        Medication List        Accurate as of September 05, 2024 11:59 PM. If you have any questions, ask your nurse or doctor.          amLODipine  2.5 MG tablet Commonly known as: NORVASC  Take 1 tablet (2.5 mg total) by mouth at bedtime.   aspirin  EC 81 MG tablet Take 1 tablet (81 mg total) by mouth daily. Swallow whole.   atorvastatin  10 MG tablet Commonly known as: LIPITOR Take 1 tablet (10 mg total) by mouth daily. Please schedule appointment for future refills   dicyclomine  10 MG capsule Commonly known as: BENTYL  Take 1 capsule (10 mg total) by mouth 4 (four) times daily -  before meals and at bedtime.  DULoxetine  30 MG capsule Commonly known as: Cymbalta  Take 1 capsule (30 mg total) by mouth daily.   estradiol  0.1 MG/GM vaginal cream Commonly known as: ESTRACE  VAGINAL Apply 0.5mg  (pea-sized amount)  just inside the vaginal introitus with a finger-tip on Monday, Wednesday and Friday nights.   hydrocortisone  cream 0.5 % Apply 1 Application topically 2 (two) times daily.   irbesartan  300 MG tablet Commonly known as: Avapro  Take 1 tablet (300 mg total) by mouth daily. Patient must schedule annual appointment for further refills  second attempt   ketoconazole  2 % cream Commonly known as: NIZORAL  Apply 1 Application topically 2 (two) times daily.   lamoTRIgine  25 MG tablet Commonly known as: LAMICTAL  Take 1 tablet (25 mg total) by mouth 2 (two) times daily.   metoprolol  succinate 50 MG 24 hr tablet Commonly known as: TOPROL -XL Take 1 tablet (50 mg total) by mouth daily. Patient must schedule annual appointment for further refills second attempt   multivitamin tablet Take 1 tablet by mouth daily.   nortriptyline  10 MG capsule Commonly known as: PAMELOR  Take 1 capsule (10 mg total) by mouth Nightly for 7 days, THEN 2 capsules (20 mg total) Nightly for 14 days. Start taking on: May 08, 2024   nystatin  powder Generic drug: nystatin  Apply 1 Application topically 2 (two) times daily. To rash until resolved.   Premarin  vaginal cream Generic drug: conjugated estrogens  Place 1 Applicatorful vaginally daily. Apply 0.5mg  (pea-sized amount)  just inside the vaginal introitus with a finger-tip for 30 days and then on  Monday, Wednesday and Friday nights.   Spacer/Aero-[employer]olding [employer] 1 Units by Does not apply route daily.   VITAMIN B-12 PO Take by mouth daily.   zolpidem  5 MG tablet Commonly known as: AMBIEN  Take 0.5-1 tablets (2.5-5 mg total) by mouth at bedtime as needed for sleep.        Allergies:  Allergies  Allergen Reactions   Penicillins Shortness Of Breath    As a child    Family History: Family History  Problem Relation Age of Onset   Hyperlipidemia Mother    Stroke Mother    Hypertension Mother    Hyperlipidemia Father    Hypertension Father    Cancer Sister        breast   Breast cancer Sister 84   Fibromyalgia Sister    Hypertension Sister    Heart Problems Sister    Hypertension Sister    Heart disease Brother        myocardial infarction   Heart attack Brother    Hypertension Brother    Prostate cancer Neg Hx    Kidney cancer Neg Hx    Bladder Cancer Neg Hx      Social History:  reports that she has never smoked. She has never used smokeless tobacco. She reports current alcohol use. She reports that she does not use drugs.  ROS: Pertinent ROS in HPI  Physical Exam: BP (!) 160/81   Pulse 66   Ht 5' (1.524 m)   Wt 164 lb (74.4 kg)   BMI 32.03 kg/m   Constitutional:  Well nourished. Alert and oriented, No acute distress. HEENT: [place] AT, moist mucus membranes.  Trachea midline Cardiovascular: No clubbing, cyanosis, or edema. Respiratory: Normal respiratory effort, no increased work of breathing. Neurologic: Grossly intact, no focal deficits, moving all 4 extremities. Psychiatric: Normal mood and affect.    Laboratory Data: See HPI and EPIC  I have reviewed the labs.   Pertinent Imaging:  N/A  Assessment & Plan:   1.  Nephrolithiasis - no episodes of renal colic  2. Mixed incontinence - still present  3. 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  These notes generated with voice recognition software. I apologize for typographical errors.  Julia Cooke  [facility] [address]  [address] [place], [place] [address] [phone]

## 2024-09-05 ENCOUNTER — Ambulatory Visit: Admitting: Urology

## 2024-09-05 VITALS — BP 160/81 | HR 66 | Ht 60.0 in | Wt 164.0 lb

## 2024-09-05 DIAGNOSIS — N200 Calculus of kidney: Secondary | ICD-10-CM | POA: Diagnosis not present

## 2024-09-05 DIAGNOSIS — N3946 Mixed incontinence: Secondary | ICD-10-CM | POA: Diagnosis not present

## 2024-09-05 DIAGNOSIS — N952 Postmenopausal atrophic vaginitis: Secondary | ICD-10-CM

## 2024-09-05 MED ORDER — PREMARIN 0.625 MG/GM VA CREA: 1.0000 | TOPICAL_CREAM | Freq: Every day | VAGINAL | 12 refills | Status: DC

## 2024-09-09 ENCOUNTER — Encounter: Payer: Self-pay | Admitting: Urology

## 2024-09-10 ENCOUNTER — Other Ambulatory Visit: Payer: Self-pay

## 2024-09-12 ENCOUNTER — Telehealth: Payer: Self-pay | Admitting: Internal Medicine

## 2024-09-12 NOTE — Telephone Encounter (Signed)
 Lm for patient to call office to schedule her AWV, patient is pass due for one.  E2C2 please schedule Medicare AWV

## 2024-09-19 ENCOUNTER — Telehealth: Payer: Self-pay | Admitting: Internal Medicine

## 2024-09-19 NOTE — Telephone Encounter (Signed)
 Pt dropped off a chronic condition verification form to be filled out by Dr Tullo for coverage by [facility]. It's in her color folder up front

## 2024-09-20 NOTE — Telephone Encounter (Signed)
 Picked up document from the front office and placed document in your blue quick sign folder

## 2024-09-22 ENCOUNTER — Encounter: Payer: Self-pay | Admitting: Internal Medicine

## 2024-09-26 NOTE — Telephone Encounter (Unsigned)
 Copied from CRM [id number]. Topic: General - Other >> Sep 26, 2024  3:29 PM Alexandria E wrote: Reason for CRM: Patient called in stating that she dropped off a form that her PCP would need to fill out confirming her chronic conditions. Patient spoke with [employer] this morning 10/29 and their records do not show they received this form back. Patient questioning if PCP office could call [phone] to confirm it was sent.

## 2024-09-27 NOTE — Telephone Encounter (Signed)
 Attempted to call the number given and the recording kept hanging up on me before I could give my reason for the call.

## 2024-09-28 NOTE — Telephone Encounter (Signed)
 Copied from CRM [id number]. Topic: Clinical - Medical Advice >> Sep 28, 2024 11:41 AM Nessti S wrote: Reason for CRM: [facility] said pt needs assistance with her chronic disease. Call back number [phone]

## 2024-10-01 NOTE — Telephone Encounter (Signed)
Form has been faxed multiple times

## 2024-10-05 ENCOUNTER — Telehealth: Payer: Self-pay

## 2024-10-05 DIAGNOSIS — H8110 Benign paroxysmal vertigo, unspecified ear: Secondary | ICD-10-CM

## 2024-10-05 NOTE — Telephone Encounter (Signed)
 Copied from CRM #[id number]. Topic: Referral - Request for Referral >> Oct 05, 2024  1:09 PM China J wrote: Did the patient discuss referral with their provider in the last year? Yes (If No - schedule appointment) (If Yes - send message)  Appointment offered? Yes  Type of order/referral and detailed reason for visit:  Ears, nose, and throat referral.  Patient had a referral initially sent to [facility] on 05/05 but they are needing it resent since it's been a long time.   Preference of office, provider, location: [facility]  If referral order, have you been seen by this specialty before? No (If Yes, this issue or another issue? When? Where?  Can we respond through MyChart? Yes

## 2024-10-05 NOTE — Telephone Encounter (Signed)
ENT referral pended.

## 2024-10-15 ENCOUNTER — Other Ambulatory Visit: Payer: Self-pay

## 2024-12-18 ENCOUNTER — Telehealth: Payer: Self-pay | Admitting: *Deleted

## 2024-12-18 ENCOUNTER — Ambulatory Visit

## 2024-12-18 VITALS — Ht 60.0 in | Wt 158.0 lb

## 2024-12-18 DIAGNOSIS — Z780 Asymptomatic menopausal state: Secondary | ICD-10-CM | POA: Diagnosis not present

## 2024-12-18 DIAGNOSIS — Z0000 Encounter for general adult medical examination without abnormal findings: Secondary | ICD-10-CM | POA: Diagnosis not present

## 2024-12-18 NOTE — Telephone Encounter (Signed)
 Spoke with pt and scheduled her for an appt tomorrow at 2:30 with Dr. Marylynn.

## 2024-12-18 NOTE — Telephone Encounter (Signed)
 Performed AWV  Patient was last seen 04/30/2024. Patient stated that she is not sure how often she needs to see Dr. Marylynn. Last office note shows she should have been seen in 3 months. Patient stated that she still has a dry cough. Patient stated that she was prescribed an inhaler but never picked it up. Patient wants to schedule an appointment to follow-up with Dr. Marylynn regarding her dry cough. No appointments available soon other than same day.

## 2024-12-18 NOTE — Patient Instructions (Addendum)
 Julia Cooke,  Thank you for taking the time for your Medicare Wellness Visit. I appreciate your continued commitment to your health goals. Please review the care plan we discussed, and feel free to reach out if I can assist you further.  Please note that Annual Wellness Visits do not include a physical exam. Some assessments may be limited, especially if the visit was conducted virtually. If needed, we may recommend an in-person follow-up with your provider.  Ongoing Care Seeing your primary care provider every 3 to 6 months helps us  monitor your health and provide consistent, personalized care.  Consider updating your vaccines  You have an order for:  []   2D Mammogram  []   3D Mammogram  [x]   Bone Density     Please call for appointment:  [facility]  [address] [place] [address] [phone]    Make sure to wear two-piece clothing.  No lotions, powders, or deodorants the day of the appointment. Make sure to bring picture ID and insurance card.  Bring list of medications you are currently taking including any supplements.    Referrals If a referral was made during today's visit and you haven't received any updates within two weeks, please contact the referred provider directly to check on the status.  Recommended Screenings:  Health Maintenance  Topic Date Due   Zoster (Shingles) Vaccine (1 of 2) Never done   Flu Shot  06/29/2024   COVID-19 Vaccine (5 - 2025-26 season) 07/30/2024   Breast Cancer Screening  05/03/2025   Cologuard (Stool DNA test)  06/16/2025   Medicare Annual Wellness Visit  12/18/2025   DTaP/Tdap/Td vaccine (2 - Td or Tdap) 03/22/2034   Pneumococcal Vaccine for age over 83  Completed   Osteoporosis screening with Bone Density Scan  Completed   Hepatitis C Screening  Completed   Meningitis B Vaccine  Aged Out       12/18/2024    2:38 PM  Advanced Directives  Does Patient Have a Medical Advance  Directive? Yes  Type of Estate Agent of [place];Living will  Does patient want to make changes to medical advance directive? No - Patient declined  Copy of Healthcare Power of Attorney in Chart? No - copy requested    Vision: Annual vision screenings are recommended for early detection of glaucoma, cataracts, and diabetic retinopathy. These exams can also reveal signs of chronic conditions such as diabetes and high blood pressure.  Dental: Annual dental screenings help detect early signs of oral cancer, gum disease, and other conditions linked to overall health, including heart disease and diabetes.  Please see the attached documents for additional preventive care recommendations.

## 2024-12-18 NOTE — Progress Notes (Signed)
 "  Chief Complaint  Patient presents with   Medicare Wellness     Subjective:   Julia Cooke is a 74 y.o. female who presents for a Medicare Annual Wellness Visit.  Visit info / Clinical Intake: Medicare Wellness Visit Type:: Subsequent Annual Wellness Visit Persons participating in visit and providing information:: patient Medicare Wellness Visit Mode:: Telephone If telephone:: video error Since this visit was completed virtually, some vitals may be partially provided or unavailable. Missing vitals are due to the limitations of the virtual format.: Unable to obtain vitals - no equipment If Telephone or Video please confirm:: I connected with patient using audio/video enable telemedicine. I verified patient identity with two identifiers, discussed telehealth limitations, and patient agreed to proceed. Patient Location:: Sitting in her car Provider Location:: Office/Home Interpreter Needed?: No Pre-visit prep was completed: yes AWV questionnaire completed by patient prior to visit?: no Living arrangements:: lives with spouse/significant other Patient's Overall Health Status Rating: good Typical amount of pain: some (right shoulder pain for months) Does pain affect daily life?: no Are you currently prescribed opioids?: no  Dietary Habits and Nutritional Risks How many meals a day?: 2 Eats fruit and vegetables daily?: yes Most meals are obtained by: preparing own meals In the last 2 weeks, have you had any of the following?: none Diabetic:: no  Functional Status Activities of Daily Living (to include ambulation/medication): Independent Ambulation: Independent Medication Administration: Independent Home Management (perform basic housework or laundry): Independent Manage your own finances?: yes Primary transportation is: driving Concerns about vision?: no *vision screening is required for WTM* Concerns about hearing?: no  Fall Screening Falls in the past year?: 1 Number of  falls in past year: 1 Was there an injury with Fall?: 1 (shoulder went to doctor) Fall Risk Category Calculator: 3 Patient Fall Risk Level: High Fall Risk  Fall Risk Patient at Risk for Falls Due to: Impaired balance/gait; History of fall(s) Fall risk Follow up: Falls evaluation completed; Falls prevention discussed  Home and Transportation Safety: All rugs have non-skid backing?: yes All stairs or steps have railings?: N/A, no stairs Grab bars in the bathtub or shower?: yes Have non-skid surface in bathtub or shower?: yes Good home lighting?: yes Regular seat belt use?: yes Hospital stays in the last year:: no  Cognitive Assessment Difficulty concentrating, remembering, or making decisions? : no Will 6CIT or Mini Cog be Completed: yes What year is it?: 0 points What month is it?: 0 points Give patient an address phrase to remember (5 components): [address] [place] About what time is it?: 0 points Count backwards from 20 to 1: 0 points Say the months of the year in reverse: 2 points Repeat the address phrase from earlier: 2 points 6 CIT Score: 4 points  Advance Directives (For Healthcare) Does Patient Have a Medical Advance Directive?: Yes Does patient want to make changes to medical advance directive?: No - Patient declined Type of Advance Directive: Healthcare Power of [place]; Living will Copy of Healthcare Power of Attorney in Chart?: No - copy requested Copy of Living Will in Chart?: No - copy requested Would patient like information on creating a medical advance directive?: No - Patient declined  Reviewed/Updated  Reviewed/Updated: Reviewed All (Medical, Surgical, Family, Medications, Allergies, Care Teams, Patient Goals)    Allergies (verified) Penicillins   Current Medications (verified) Outpatient Encounter Medications as of 12/18/2024  Medication Sig   amLODipine  (NORVASC ) 2.5 MG tablet Take 1 tablet (2.5 mg total) by mouth at bedtime.  aspirin  EC  81 MG tablet Take 1 tablet (81 mg total) by mouth daily. Swallow whole.   atorvastatin  (LIPITOR) 10 MG tablet Take 1 tablet (10 mg total) by mouth daily. Please schedule appointment for future refills   hydrocortisone  cream 0.5 % Apply 1 Application topically 2 (two) times daily. (Patient taking differently: Apply 1 Application topically 2 (two) times daily as needed.)   irbesartan  (AVAPRO ) 300 MG tablet Take 1 tablet (300 mg total) by mouth daily. Patient must schedule annual appointment for further refills second attempt   lamoTRIgine  (LAMICTAL ) 25 MG tablet Take 1 tablet (25 mg total) by mouth 2 (two) times daily.   Melatonin 5 MG CAPS Take by mouth at bedtime as needed.   metoprolol  succinate (TOPROL -XL) 50 MG 24 hr tablet Take 1 tablet (50 mg total) by mouth daily. Patient must schedule annual appointment for further refills second attempt   Multiple Vitamin (MULTIVITAMIN) tablet Take 1 tablet by mouth daily.   nystatin  (MYCOSTATIN /NYSTOP ) powder Apply 1 Application topically 2 (two) times daily. To rash until resolved.   conjugated estrogens  (PREMARIN ) vaginal cream Place 1 Applicatorful vaginally daily. Apply 0.5mg  (pea-sized amount)  just inside the vaginal introitus with a finger-tip for 30 days and then on  Monday, Wednesday and Friday nights.   Cyanocobalamin  (VITAMIN B-12 PO) Take by mouth daily. (Patient not taking: Reported on 12/18/2024)   dicyclomine  (BENTYL ) 10 MG capsule Take 1 capsule (10 mg total) by mouth 4 (four) times daily -  before meals and at bedtime. (Patient not taking: Reported on 09/05/2024)   DULoxetine  (CYMBALTA ) 30 MG capsule Take 1 capsule (30 mg total) by mouth daily. (Patient not taking: Reported on 09/05/2024)   estradiol  (ESTRACE  VAGINAL) 0.1 MG/GM vaginal cream Apply 0.5mg  (pea-sized amount)  just inside the vaginal introitus with a finger-tip on Monday, Wednesday and Friday nights. (Patient not taking: Reported on 09/05/2024)   ketoconazole  (NIZORAL ) 2 % cream Apply  1 Application topically 2 (two) times daily. (Patient not taking: Reported on 12/18/2024)   nortriptyline  (PAMELOR ) 10 MG capsule Take 1 capsule (10 mg total) by mouth Nightly for 7 days, THEN 2 capsules (20 mg total) Nightly for 14 days. (Patient not taking: No sig reported)   Spacer/Aero-Holding Raguel DEVI 1 Units by Does not apply route daily. (Patient not taking: Reported on 12/18/2024)   zolpidem  (AMBIEN ) 5 MG tablet Take 0.5-1 tablets (2.5-5 mg total) by mouth at bedtime as needed for sleep.   [DISCONTINUED] omeprazole  (PRILOSEC) 20 MG capsule Take 1 capsule (20 mg total) by mouth daily.   No facility-administered encounter medications on file as of 12/18/2024.    History: Past Medical History:  Diagnosis Date   Bipolar disorder (HCC)    takes Abilify    Depression    Dysrhythmia 10/2016   LBBB on ekg...nothing to compare it to.   Heart murmur    Hypertension    Nephrolithiasis 2000   s/p lithotripsy   Psychotic disorder (HCC)    mania   Wears contact lenses    Past Surgical History:  Procedure Laterality Date   ARTHRODESIS METATARSALPHALANGEAL JOINT (MTPJ) Left 03/09/2023   Procedure: ARTHRODESIS METATARSALPHALANGEAL JOINT (MTPJ);  Surgeon: Ashley Soulier, DPM;  Location: [facility];  Service: Podiatry;  Laterality: Left;   CAPSULOTOMY METATARSOPHALANGEAL Left 03/09/2023   Procedure: CAPSULOTOMY METATARSOPHALANGEAL SECOND;  Surgeon: Ashley Soulier, DPM;  Location: [facility];  Service: Podiatry;  Laterality: Left;   EXTRACORPOREAL SHOCK WAVE LITHOTRIPSY Left 01/06/2017   Procedure: EXTRACORPOREAL SHOCK WAVE LITHOTRIPSY (  ESWL);  Surgeon: Rosina Riis, MD;  Location: [facility];  Service: Urology;  Laterality: Left;   FLEXOR TENOTOMY  Left 03/09/2023   Procedure: FLEXOR TENOTOMY;  Surgeon: Ashley Soulier, DPM;  Location: [facility];  Service: Podiatry;  Laterality: Left;   LITHOTRIPSY  2000   Family History  Problem Relation Age of Onset    Hyperlipidemia Mother    Stroke Mother    Hypertension Mother    Hyperlipidemia Father    Hypertension Father    Cancer Sister        breast   Breast cancer Sister 67   Fibromyalgia Sister    Hypertension Sister    Heart Problems Sister    Hypertension Sister    Heart disease Brother        myocardial infarction   Heart attack Brother    Hypertension Brother    Prostate cancer Neg Hx    Kidney cancer Neg Hx    Bladder Cancer Neg Hx    Social History   Occupational History    Comment: retired  Tobacco Use   Smoking status: Never   Smokeless tobacco: Never  Vaping Use   Vaping status: Never Used  Substance and Sexual Activity   Alcohol use: Yes    Alcohol/week: 0.0 - 2.0 standard drinks of alcohol    Comment: 2-3 times weekly   Drug use: No   Sexual activity: Yes    Birth control/protection: Post-menopausal, None   Tobacco Counseling Counseling given: Not Answered  SDOH Screenings   Food Insecurity: No Food Insecurity (12/18/2024)  Recent Concern: Food Insecurity - Food Insecurity Present (10/30/2024)   Received [facility]  Housing: Low Risk (12/18/2024)  Recent Concern: Housing - High Risk (10/30/2024)   Received from [facility]  Transportation Needs: No Transportation Needs (12/18/2024)  Utilities: Not At Risk (12/18/2024)  Alcohol Screen: Low Risk (04/29/2024)  Depression (PHQ2-9): Low Risk (12/18/2024)  Financial Resource Strain: Medium Risk (10/30/2024)   Received from [facility]  Physical Activity: Inactive (12/18/2024)  Social Connections: Socially Integrated (12/18/2024)  Stress: No Stress Concern Present (12/18/2024)  Tobacco Use: Low Risk (12/18/2024)  Health Literacy: Adequate Health Literacy (12/18/2024)  Patient and her husband have been asked to move out of her daughters home and needs to find a new place to live soon. Patient is aware of resources.   See flowsheets for full screening  details  Depression Screen PHQ 2 & 9 Depression Scale- Over the past 2 weeks, how often have you been bothered by any of the following problems? Little interest or pleasure in doing things: 0 Feeling down, depressed, or hopeless (PHQ Adolescent also includes...irritable): 0 PHQ-2 Total Score: 0 Trouble falling or staying asleep, or sleeping too much: 0 Feeling tired or having little energy: 0 Poor appetite or overeating (PHQ Adolescent also includes...weight loss): 0 Feeling bad about yourself - or that you are a failure or have let yourself or your family down: 0 Trouble concentrating on things, such as reading the newspaper or watching television (PHQ Adolescent also includes...like school work): 0 Moving or speaking so slowly that other people could have noticed. Or the opposite - being so fidgety or restless that you have been moving around a lot more than usual: 0 Thoughts that you would be better off dead, or of hurting yourself in some way: 0 PHQ-9 Total Score: 0 If you checked off any problems, how difficult have these problems made it for you to  do your work, take care of things at home, or get along with other people?: Not difficult at all     Goals Addressed             This Visit's Progress    Patient Stated       Wants to exercise             Objective:    Today's Vitals   12/18/24 1428  Weight: 158 lb (71.7 kg)  Height: 5' (1.524 m)   Body mass index is 30.86 kg/m.  Hearing/Vision screen Hearing Screening - Comments:: No issues Vision Screening - Comments:: Contacts, glasses, [facility], up to date Immunizations and Health Maintenance Health Maintenance  Topic Date Due   Zoster Vaccines- Shingrix (1 of 2) Never done   Influenza Vaccine  06/29/2024   COVID-19 Vaccine (5 - 2025-26 season) 07/30/2024   Mammogram  05/03/2025   Fecal DNA (Cologuard)  06/16/2025   Medicare Annual Wellness (AWV)  12/18/2025   DTaP/Tdap/Td (2 - Td or Tdap) 03/22/2034    Pneumococcal Vaccine: 50+ Years  Completed   Bone Density Scan  Completed   Hepatitis C Screening  Completed   Meningococcal B Vaccine  Aged Out        Assessment/Plan:  This is a routine wellness examination for Julia Cooke.  Patient Care Team: Marylynn Verneita CROME, MD as PCP - General (Internal Medicine) Darron Deatrice LABOR, MD as PCP - Cardiology (Cardiology) Helon Clotilda LABOR, PA-C as Physician Assistant (Urology)  I have personally reviewed and noted the following in the patients chart:   Medical and social history Use of alcohol, tobacco or illicit drugs  Current medications and supplements including opioid prescriptions. Functional ability and status Nutritional status Physical activity Advanced directives List of other physicians Hospitalizations, surgeries, and ER visits in previous 12 months Vitals Screenings to include cognitive, depression, and falls Referrals and appointments  No orders of the defined types were placed in this encounter.  In addition, I have reviewed and discussed with patient certain preventive protocols, quality metrics, and best practice recommendations. A written personalized care plan for preventive services as well as general preventive health recommendations were provided to patient.   Angeline Fredericks, LPN   8/79/7973   Return in 1 year (on 12/18/2025).  After Visit Summary: (MyChart) Due to this being a telephonic visit, the after visit summary with patients personalized plan was offered to patient via MyChart   Nurse Notes: Dexa order placed.  Patient declines flu, covid and shingles vaccines. Phone note sent to PCP "

## 2024-12-19 ENCOUNTER — Encounter: Payer: Self-pay | Admitting: Internal Medicine

## 2024-12-19 ENCOUNTER — Other Ambulatory Visit: Payer: Self-pay

## 2024-12-19 ENCOUNTER — Ambulatory Visit (INDEPENDENT_AMBULATORY_CARE_PROVIDER_SITE_OTHER): Admitting: Internal Medicine

## 2024-12-19 VITALS — BP 134/60 | HR 102 | Ht 60.0 in | Wt 167.0 lb

## 2024-12-19 DIAGNOSIS — Z79899 Other long term (current) drug therapy: Secondary | ICD-10-CM

## 2024-12-19 DIAGNOSIS — R5383 Other fatigue: Secondary | ICD-10-CM | POA: Diagnosis not present

## 2024-12-19 DIAGNOSIS — I10 Essential (primary) hypertension: Secondary | ICD-10-CM | POA: Diagnosis not present

## 2024-12-19 DIAGNOSIS — E785 Hyperlipidemia, unspecified: Secondary | ICD-10-CM | POA: Diagnosis not present

## 2024-12-19 DIAGNOSIS — G4701 Insomnia due to medical condition: Secondary | ICD-10-CM | POA: Diagnosis not present

## 2024-12-19 DIAGNOSIS — F3170 Bipolar disorder, currently in remission, most recent episode unspecified: Secondary | ICD-10-CM

## 2024-12-19 DIAGNOSIS — F3161 Bipolar disorder, current episode mixed, mild: Secondary | ICD-10-CM | POA: Diagnosis not present

## 2024-12-19 DIAGNOSIS — B948 Sequelae of other specified infectious and parasitic diseases: Secondary | ICD-10-CM | POA: Diagnosis not present

## 2024-12-19 DIAGNOSIS — R053 Chronic cough: Secondary | ICD-10-CM | POA: Diagnosis not present

## 2024-12-19 DIAGNOSIS — R7301 Impaired fasting glucose: Secondary | ICD-10-CM | POA: Diagnosis not present

## 2024-12-19 MED ORDER — PANTOPRAZOLE SODIUM 40 MG PO TBEC
40.0000 mg | DELAYED_RELEASE_TABLET | Freq: Every day | ORAL | 3 refills | Status: AC
  Filled 2024-12-19: qty 30, 30d supply, fill #0

## 2024-12-19 MED ORDER — LAMOTRIGINE 25 MG PO TABS
25.0000 mg | ORAL_TABLET | Freq: Two times a day (BID) | ORAL | 3 refills | Status: AC
  Filled 2024-12-19: qty 60, 30d supply, fill #0

## 2024-12-19 MED ORDER — AMLODIPINE BESYLATE 2.5 MG PO TABS
2.5000 mg | ORAL_TABLET | Freq: Every day | ORAL | 1 refills | Status: AC
  Filled 2024-12-19: qty 90, 90d supply, fill #0

## 2024-12-19 NOTE — Patient Instructions (Addendum)
 The reason for your cough may be GERD (REFLUX).    is an extremely common cause of respiratory symptoms just like yours , many times with no obvious heartburn at all.    It can be treated with medication, but also with lifestyle changes including elevation of the head of your bed (ideally with 6 inch  bed blocks),  avoidance of late night eating,  and avoiding  fatty foods, chocolate, peppermint, colas, red wine, and acidic juices such as orange juice.   NO MINT OR MENTHOL PRODUCTS SO NO COUGH DROPS  USE SUGARLESS CANDY INSTEAD (Jolley ranchers or Stover's or Life Savers) or even ice chips will also do - the key is to swallow to prevent all throat clearing. NO OIL BASED VITAMINS - use powdered substitutes.    Please start taking the pantoprazole  (protonix ) early AM and look for a call from [facility] to schedule your pulmonary function tests and your Pulmonary referral    I have refilled your lamictal  while you are looking for a psychiatrist.  LAMICTAL  CAN CAUSE A SERIOUS LIFE THREATENING RASH IF YOU ARE NOT CONSISTENT WITH TAKING IT SO DO NOT START AND STOP THIS MEDICATION

## 2024-12-19 NOTE — Progress Notes (Unsigned)
 "  Subjective:  Patient ID: Julia Cooke, female    DOB: [birth year]  Age: 74 y.o. MRN: [id number]  CC: The primary encounter diagnosis was Essential hypertension. Diagnoses of Hyperlipidemia LDL goal <100, Other fatigue, and Impaired fasting glucose were also pertinent to this visit.   HPI [name] presents for  Chief Complaint  Patient presents with   Medical Management of Chronic Issues    Cc: dry cough. Has been persistent , made worse by a COVID infection in 2022.   Getting worse.  Causing her to gag  before May  .  No recent pulmonary evaluation ,  at least in 15 yrs.  Both parents smoked.  Had frequent episodes of bronchitis as a child  every winter. . Never smoker.  Cough is worse in the afternoon.  Does not exercise.  Wakes up coughing.  Does not recall if PPI helped  has not tried elevating head of bed   2) hair loss:  stressed out about living situation   3) right shoulder pain ,  constant  seeing Tobie at [facility]  .had one I/a injection  Started PT     4) Bipolar disorder : has been fired by psychiatrist Eappen for no showing .  Has not found  her replacement   Outpatient Medications Prior to Visit  Medication Sig Dispense Refill   amLODipine  (NORVASC ) 2.5 MG tablet Take 1 tablet (2.5 mg total) by mouth at bedtime. 90 tablet 1   aspirin  EC 81 MG tablet Take 1 tablet (81 mg total) by mouth daily. Swallow whole.     atorvastatin  (LIPITOR) 10 MG tablet Take 1 tablet (10 mg total) by mouth daily. Please schedule appointment for future refills 90 tablet 3   irbesartan  (AVAPRO ) 300 MG tablet Take 1 tablet (300 mg total) by mouth daily. Patient must schedule annual appointment for further refills second attempt 90 tablet 3   lamoTRIgine  (LAMICTAL ) 25 MG tablet Take 1 tablet (25 mg total) by mouth 2 (two) times daily. 60 tablet 0   Melatonin 5 MG CAPS Take by mouth at bedtime as needed.     metoprolol  succinate (TOPROL -XL) 50 MG 24 hr tablet Take 1 tablet (50 mg total) by  mouth daily. Patient must schedule annual appointment for further refills second attempt 90 tablet 3   Multiple Vitamin (MULTIVITAMIN) tablet Take 1 tablet by mouth daily.     nystatin  (MYCOSTATIN /NYSTOP ) powder Apply 1 Application topically 2 (two) times daily. To rash until resolved. 15 g 1   Spacer/Aero-Holding Chambers DEVI 1 Units by Does not apply route daily. 1 Units 0   conjugated estrogens  (PREMARIN ) vaginal cream Place 1 Applicatorful vaginally daily. Apply 0.5mg  (pea-sized amount)  just inside the vaginal introitus with a finger-tip for 30 days and then on  Monday, Wednesday and Friday nights. 30 g 12   Cyanocobalamin  (VITAMIN B-12 PO) Take by mouth daily. (Patient not taking: Reported on 12/18/2024)     dicyclomine  (BENTYL ) 10 MG capsule Take 1 capsule (10 mg total) by mouth 4 (four) times daily -  before meals and at bedtime. (Patient not taking: Reported on 09/05/2024) 120 capsule 3   DULoxetine  (CYMBALTA ) 30 MG capsule Take 1 capsule (30 mg total) by mouth daily. (Patient not taking: Reported on 09/05/2024) 30 capsule 1   estradiol  (ESTRACE  VAGINAL) 0.1 MG/GM vaginal cream Apply 0.5mg  (pea-sized amount)  just inside the vaginal introitus with a finger-tip on Monday, Wednesday and Friday nights. (Patient not taking: Reported on 09/05/2024) 42.5  g 12   hydrocortisone  cream 0.5 % Apply 1 Application topically 2 (two) times daily. (Patient taking differently: Apply 1 Application topically 2 (two) times daily as needed.) 28.4 g 0   ketoconazole  (NIZORAL ) 2 % cream Apply 1 Application topically 2 (two) times daily. (Patient not taking: Reported on 12/18/2024) 60 g 1   nortriptyline  (PAMELOR ) 10 MG capsule Take 1 capsule (10 mg total) by mouth Nightly for 7 days, THEN 2 capsules (20 mg total) Nightly for 14 days. (Patient not taking: No sig reported) 60 capsule 1   omeprazole  (PRILOSEC) 20 MG capsule Take 1 capsule (20 mg total) by mouth daily. 90 capsule 1   zolpidem  (AMBIEN ) 5 MG tablet Take 0.5-1  tablets (2.5-5 mg total) by mouth at bedtime as needed for sleep. 30 tablet 3   No facility-administered medications prior to visit.    Review of Systems;  Patient denies headache, fevers, malaise, unintentional weight loss, skin rash, eye pain, sinus congestion and sinus pain, sore throat, dysphagia,  hemoptysis , cough, dyspnea, wheezing, chest pain, palpitations, orthopnea, edema, abdominal pain, nausea, melena, diarrhea, constipation, flank pain, dysuria, hematuria, urinary  Frequency, nocturia, numbness, tingling, seizures,  Focal weakness, Loss of consciousness,  Tremor, insomnia, depression, anxiety, and suicidal ideation.      Objective:  BP 134/60   Pulse (!) 102   Ht 5' (1.524 m)   Wt 167 lb (75.8 kg)   SpO2 97%   BMI 32.61 kg/m   BP Readings from Last 3 Encounters:  12/19/24 134/60  09/05/24 (!) 160/81  06/11/24 (!) 130/59    Wt Readings from Last 3 Encounters:  12/19/24 167 lb (75.8 kg)  12/18/24 158 lb (71.7 kg)  09/05/24 164 lb (74.4 kg)    Physical Exam  Lab Results  Component Value Date   HGBA1C 5.2 03/02/2024   HGBA1C 5.0 01/02/2024   HGBA1C 5.2 03/01/2023    Lab Results  Component Value Date   CREATININE 1.31 (H) 05/11/2024   CREATININE 0.82 04/02/2024   CREATININE 0.79 03/01/2024    Lab Results  Component Value Date   WBC 8.1 03/01/2024   HGB 14.3 03/01/2024   HCT 41.5 03/01/2024   PLT 240 03/01/2024   GLUCOSE 140 (H) 04/02/2024   CHOL 126 03/02/2024   TRIG 110 03/02/2024   HDL 36 (L) 03/02/2024   LDLDIRECT 98.0 01/02/2024   LDLCALC 68 03/02/2024   ALT 18 03/01/2024   AST 20 03/01/2024   NA 141 05/11/2024   K 5.2 05/11/2024   CL 103 05/11/2024   CREATININE 1.31 (H) 05/11/2024   BUN 24 (H) 04/02/2024   CO2 23 05/11/2024   TSH 0.68 01/02/2024   HGBA1C 5.2 03/02/2024    MR SHOULDER RIGHT WO CONTRAST Result Date: 08/27/2024 EXAM DESCRIPTION: MR SHOULDER RIGHT WO CONTRAST CLINICAL HISTORY: 73 years, Female, pain. COMPARISON: None  TECHNIQUE: MRI of the shoulder was performed with multiplanar multi sequence imaging according to our usual protocol. FINDINGS: There is a large full-thickness tear of the supraspinatus and infraspinatus tendons with fibers retracted near the glenoid rim. Greater retraction of supraspinatus fibers. No significant muscle belly atrophy. The subscapularis and teres minor tendons are unremarkable. No significant joint effusion. No labral tear. Suspicion of a least a partial tear to the proximal most aspect of the intra-articular biceps tendon with attenuated fibers. Recommend clinical correlation. Mild acromioclavicular osteoarthritis. 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 No significant muscle belly atrophy. Suspicion of at least a partial tear  of the proximal intra-articular biceps tendon with attenuated fibers. This is best seen on the coronal sequences. Mild acromioclavicular osteoarthritis. Electronically signed by: Reyes Frees MD 08/27/2024 12:42 PM EDT RP Workstation: [id number]    Assessment & Plan:  .Essential hypertension  Hyperlipidemia LDL goal <100  Other fatigue  Impaired fasting glucose     I spent 34 minutes on the day of this face to face encounter reviewing patient's  most recent visit with cardiology,  nephrology,  and neurology,  prior relevant surgical and non surgical procedures, recent  labs and imaging studies, counseling on weight management,  reviewing the assessment and plan with patient, and post visit ordering and reviewing of  diagnostics and therapeutics with patient  .   Follow-up: No follow-ups on file.   Verneita LITTIE Kettering, MD "

## 2024-12-20 DIAGNOSIS — R059 Cough, unspecified: Secondary | ICD-10-CM | POA: Insufficient documentation

## 2024-12-20 LAB — COMPREHENSIVE METABOLIC PANEL WITH GFR
ALT: 13 U/L (ref 3–35)
AST: 17 U/L (ref 5–37)
Albumin: 4.4 g/dL (ref 3.5–5.2)
Alkaline Phosphatase: 76 U/L (ref 39–117)
BUN: 24 mg/dL — ABNORMAL HIGH (ref 6–23)
CO2: 28 meq/L (ref 19–32)
Calcium: 10.1 mg/dL (ref 8.4–10.5)
Chloride: 104 meq/L (ref 96–112)
Creatinine, Ser: 0.78 mg/dL (ref 0.40–1.20)
GFR: 75.56 mL/min
Glucose, Bld: 92 mg/dL (ref 70–99)
Potassium: 4.8 meq/L (ref 3.5–5.1)
Sodium: 140 meq/L (ref 135–145)
Total Bilirubin: 0.7 mg/dL (ref 0.2–1.2)
Total Protein: 7.2 g/dL (ref 6.0–8.3)

## 2024-12-20 LAB — MICROALBUMIN / CREATININE URINE RATIO
Creatinine,U: 81.5 mg/dL
Microalb Creat Ratio: UNDETERMINED mg/g (ref 0.0–30.0)
Microalb, Ur: <0.7 mg/dL

## 2024-12-20 LAB — CBC WITH DIFFERENTIAL/PLATELET
Basophils Absolute: 0.1 K/uL (ref 0.0–0.1)
Basophils Relative: 1.1 % (ref 0.0–3.0)
Eosinophils Absolute: 0.2 K/uL (ref 0.0–0.7)
Eosinophils Relative: 2 % (ref 0.0–5.0)
HCT: 39.9 % (ref 36.0–46.0)
Hemoglobin: 13.5 g/dL (ref 12.0–15.0)
Lymphocytes Relative: 23.3 % (ref 12.0–46.0)
Lymphs Abs: 2.1 K/uL (ref 0.7–4.0)
MCHC: 33.9 g/dL (ref 30.0–36.0)
MCV: 88.9 fl (ref 78.0–100.0)
Monocytes Absolute: 0.8 K/uL (ref 0.1–1.0)
Monocytes Relative: 8.6 % (ref 3.0–12.0)
Neutro Abs: 5.9 K/uL (ref 1.4–7.7)
Neutrophils Relative %: 65 % (ref 43.0–77.0)
Platelets: 307 K/uL (ref 150.0–400.0)
RBC: 4.48 Mil/uL (ref 3.87–5.11)
RDW: 13 % (ref 11.5–15.5)
WBC: 9 K/uL (ref 4.0–10.5)

## 2024-12-20 LAB — HEMOGLOBIN A1C: Hgb A1c MFr Bld: 5.2 % (ref 4.6–6.5)

## 2024-12-20 LAB — LDL CHOLESTEROL, DIRECT: Direct LDL: 114 mg/dL

## 2024-12-20 LAB — LIPID PANEL
Cholesterol: 165 mg/dL (ref 28–200)
HDL: 46 mg/dL
LDL Cholesterol: 74 mg/dL (ref 10–99)
NonHDL: 118.75
Total CHOL/HDL Ratio: 4
Triglycerides: 223 mg/dL — ABNORMAL HIGH (ref 10.0–149.0)
VLDL: 44.6 mg/dL — ABNORMAL HIGH (ref 0.0–40.0)

## 2024-12-20 LAB — TSH: TSH: 0.23 u[IU]/mL — ABNORMAL LOW (ref 0.35–5.50)

## 2024-12-20 NOTE — Assessment & Plan Note (Signed)
 She has a historyf of inconsistent use of  use of lamictal . Counsellling given and the risks explained

## 2024-12-20 NOTE — Assessment & Plan Note (Signed)
 Based on review of notes from psychiatry she has been noncompliant with medications which may explain her mood disorder .  She has been fired by her psychiatrist for missing appointments.  She is currently taking lamictal  25 m bid.  Will refill until she is seeing another psychiatrist

## 2024-12-20 NOTE — Assessment & Plan Note (Signed)
 Discussed the possible etiologies of persistent cough including but not limited to post nasal drip,  Allergic rhinitis,  Asthma,  GERD and chronic cyclic cough due to persistent irritation.  She is not taking and ACE Inhibitor.  Suggested treating for allergic rhinitis,  PND and GERD to break cycle and reevaluation in a few weeks.  She request a pulmonology referral.

## 2024-12-31 ENCOUNTER — Ambulatory Visit: Admitting: Physician Assistant

## 2024-12-31 ENCOUNTER — Encounter: Payer: Self-pay | Admitting: Physician Assistant

## 2024-12-31 VITALS — BP 130/78 | HR 94 | Ht 60.0 in | Wt 172.6 lb

## 2024-12-31 DIAGNOSIS — I340 Nonrheumatic mitral (valve) insufficiency: Secondary | ICD-10-CM

## 2024-12-31 DIAGNOSIS — R0609 Other forms of dyspnea: Secondary | ICD-10-CM

## 2024-12-31 DIAGNOSIS — E785 Hyperlipidemia, unspecified: Secondary | ICD-10-CM | POA: Diagnosis not present

## 2024-12-31 DIAGNOSIS — I10 Essential (primary) hypertension: Secondary | ICD-10-CM | POA: Diagnosis not present

## 2024-12-31 NOTE — Patient Instructions (Signed)
 Medication Instructions:  Your physician recommends that you continue on your current medications as directed. Please refer to the Current Medication list given to you today.   *If you need a refill on your cardiac medications before your next appointment, please call your pharmacy*  Lab Work: None ordered at this time  If you have labs (blood work) drawn today and your tests are completely normal, you will receive your results only by: MyChart Message (if you have MyChart) OR A paper copy in the mail If you have any lab test that is abnormal or we need to change your treatment, we will call you to review the results.  Testing/Procedures: None ordered at this time   Follow-Up: At [facility], you and your health needs are our priority.  As part of our continuing mission to provide you with exceptional heart care, our providers are all part of one team.  This team includes your primary Cardiologist (physician) and Advanced Practice Providers or APPs (Physician Assistants and Nurse Practitioners) who all work together to provide you with the care you need, when you need it.  Your next appointment:   6 month(s)  Provider:   You may see Deatrice Cage, MD or Bernardino Bring, PA-C

## 2025-01-16 ENCOUNTER — Other Ambulatory Visit

## 2025-06-18 ENCOUNTER — Ambulatory Visit: Admitting: Internal Medicine

## 2025-12-23 ENCOUNTER — Ambulatory Visit
